# Patient Record
Sex: Male | Born: 1980 | Race: Black or African American | Hispanic: No | Marital: Single | Smoking: Current every day smoker
Health system: Southern US, Community
[De-identification: ages and names within clinical notes are randomized; demographics above are authoritative.]

## PROBLEM LIST (undated history)

## (undated) DIAGNOSIS — G629 Polyneuropathy, unspecified: Secondary | ICD-10-CM

## (undated) DIAGNOSIS — B1920 Unspecified viral hepatitis C without hepatic coma: Secondary | ICD-10-CM

## (undated) DIAGNOSIS — M549 Dorsalgia, unspecified: Secondary | ICD-10-CM

## (undated) DIAGNOSIS — F329 Major depressive disorder, single episode, unspecified: Secondary | ICD-10-CM

## (undated) DIAGNOSIS — Z765 Malingerer [conscious simulation]: Secondary | ICD-10-CM

## (undated) DIAGNOSIS — F1910 Other psychoactive substance abuse, uncomplicated: Secondary | ICD-10-CM

## (undated) DIAGNOSIS — G8929 Other chronic pain: Secondary | ICD-10-CM

## (undated) DIAGNOSIS — M542 Cervicalgia: Secondary | ICD-10-CM

## (undated) DIAGNOSIS — G4700 Insomnia, unspecified: Secondary | ICD-10-CM

## (undated) DIAGNOSIS — F32A Depression, unspecified: Secondary | ICD-10-CM

## (undated) DIAGNOSIS — M25569 Pain in unspecified knee: Secondary | ICD-10-CM

## (undated) DIAGNOSIS — R52 Pain, unspecified: Secondary | ICD-10-CM

## (undated) DIAGNOSIS — F419 Anxiety disorder, unspecified: Secondary | ICD-10-CM

## (undated) DIAGNOSIS — I499 Cardiac arrhythmia, unspecified: Secondary | ICD-10-CM

## (undated) DIAGNOSIS — R4589 Other symptoms and signs involving emotional state: Secondary | ICD-10-CM

## (undated) HISTORY — PX: PLEURAL SCARIFICATION: SHX748

## (undated) HISTORY — DX: Insomnia, unspecified: G47.00

## (undated) HISTORY — DX: Dorsalgia, unspecified: M54.9

## (undated) HISTORY — DX: Polyneuropathy, unspecified: G62.9

## (undated) HISTORY — DX: Major depressive disorder, single episode, unspecified: F32.9

## (undated) HISTORY — PX: ABDOMINAL SURGERY: SHX537

## (undated) HISTORY — DX: Depression, unspecified: F32.A

## (undated) HISTORY — PX: COLON SURGERY: SHX602

## (undated) HISTORY — DX: Other psychoactive substance abuse, uncomplicated: F19.10

## (undated) HISTORY — DX: Anxiety disorder, unspecified: F41.9

---

## 2004-06-22 ENCOUNTER — Ambulatory Visit: Payer: Self-pay | Admitting: Occupational Therapy

## 2009-05-18 ENCOUNTER — Inpatient Hospital Stay (HOSPITAL_COMMUNITY): Admission: AC | Admit: 2009-05-18 | Discharge: 2009-05-27 | Payer: Self-pay

## 2009-05-22 ENCOUNTER — Ambulatory Visit: Payer: Self-pay | Admitting: Physical Medicine & Rehabilitation

## 2009-09-25 ENCOUNTER — Encounter: Admission: RE | Admit: 2009-09-25 | Discharge: 2009-09-25 | Payer: Self-pay | Admitting: Neurosurgery

## 2009-11-18 ENCOUNTER — Emergency Department (HOSPITAL_COMMUNITY): Admission: EM | Admit: 2009-11-18 | Discharge: 2009-11-18 | Payer: Self-pay | Admitting: Emergency Medicine

## 2009-12-25 ENCOUNTER — Encounter
Admission: RE | Admit: 2009-12-25 | Discharge: 2009-12-25 | Payer: Self-pay | Admitting: Physical Medicine & Rehabilitation

## 2010-03-19 ENCOUNTER — Encounter
Admission: RE | Admit: 2010-03-19 | Discharge: 2010-03-26 | Payer: Self-pay | Admitting: Physical Medicine & Rehabilitation

## 2010-03-26 ENCOUNTER — Ambulatory Visit: Payer: Self-pay | Admitting: Physical Medicine & Rehabilitation

## 2010-05-14 ENCOUNTER — Encounter
Admission: RE | Admit: 2010-05-14 | Discharge: 2010-05-27 | Payer: Self-pay | Admitting: Physical Medicine & Rehabilitation

## 2010-05-15 ENCOUNTER — Emergency Department (HOSPITAL_COMMUNITY): Admission: EM | Admit: 2010-05-15 | Discharge: 2010-05-15 | Payer: Self-pay | Admitting: Emergency Medicine

## 2010-05-27 ENCOUNTER — Encounter (HOSPITAL_COMMUNITY)
Admission: RE | Admit: 2010-05-27 | Discharge: 2010-05-29 | Payer: Self-pay | Admitting: Physical Medicine & Rehabilitation

## 2010-06-03 ENCOUNTER — Encounter (HOSPITAL_COMMUNITY)
Admission: RE | Admit: 2010-06-03 | Discharge: 2010-07-03 | Payer: Self-pay | Source: Home / Self Care | Admitting: Physical Medicine & Rehabilitation

## 2010-06-08 ENCOUNTER — Emergency Department (HOSPITAL_COMMUNITY): Admission: EM | Admit: 2010-06-08 | Discharge: 2010-06-08 | Payer: Self-pay | Admitting: Emergency Medicine

## 2010-07-06 ENCOUNTER — Encounter (HOSPITAL_COMMUNITY)
Admission: RE | Admit: 2010-07-06 | Discharge: 2010-08-05 | Payer: Self-pay | Source: Home / Self Care | Admitting: Physical Medicine & Rehabilitation

## 2010-07-08 ENCOUNTER — Emergency Department (HOSPITAL_COMMUNITY): Admission: EM | Admit: 2010-07-08 | Discharge: 2010-07-08 | Payer: Self-pay | Admitting: Emergency Medicine

## 2010-07-13 ENCOUNTER — Emergency Department (HOSPITAL_COMMUNITY): Admission: EM | Admit: 2010-07-13 | Discharge: 2010-07-13 | Payer: Self-pay | Admitting: Emergency Medicine

## 2010-07-20 ENCOUNTER — Emergency Department (HOSPITAL_COMMUNITY): Admission: EM | Admit: 2010-07-20 | Discharge: 2010-07-20 | Payer: Self-pay | Admitting: Emergency Medicine

## 2010-08-03 ENCOUNTER — Inpatient Hospital Stay (HOSPITAL_COMMUNITY)
Admission: EM | Admit: 2010-08-03 | Discharge: 2010-08-08 | Payer: Self-pay | Attending: Psychiatry | Admitting: Psychiatry

## 2010-08-03 ENCOUNTER — Emergency Department (HOSPITAL_COMMUNITY): Admission: EM | Admit: 2010-08-03 | Discharge: 2010-08-03 | Payer: Self-pay | Admitting: Emergency Medicine

## 2010-08-03 ENCOUNTER — Ambulatory Visit: Payer: Self-pay | Admitting: Psychiatry

## 2010-08-10 ENCOUNTER — Encounter (HOSPITAL_COMMUNITY)
Admission: RE | Admit: 2010-08-10 | Discharge: 2010-09-09 | Payer: Self-pay | Source: Home / Self Care | Attending: Physical Medicine & Rehabilitation | Admitting: Physical Medicine & Rehabilitation

## 2010-09-09 ENCOUNTER — Emergency Department (HOSPITAL_COMMUNITY)
Admission: EM | Admit: 2010-09-09 | Discharge: 2010-09-09 | Payer: Self-pay | Source: Home / Self Care | Admitting: Emergency Medicine

## 2010-09-20 ENCOUNTER — Encounter: Payer: Self-pay | Admitting: Neurosurgery

## 2010-11-10 LAB — RAPID URINE DRUG SCREEN, HOSP PERFORMED
Amphetamines: NOT DETECTED
Amphetamines: NOT DETECTED
Barbiturates: NOT DETECTED
Benzodiazepines: POSITIVE — AB
Cocaine: POSITIVE — AB
Opiates: NOT DETECTED
Opiates: POSITIVE — AB
Tetrahydrocannabinol: POSITIVE — AB
Tetrahydrocannabinol: NOT DETECTED

## 2010-11-10 LAB — DIFFERENTIAL
Basophils Absolute: 0 10*3/uL (ref 0.0–0.1)
Basophils Relative: 0 % (ref 0–1)
Basophils Relative: 0 % (ref 0–1)
Eosinophils Absolute: 0.1 10*3/uL (ref 0.0–0.7)
Eosinophils Relative: 1 % (ref 0–5)
Lymphs Abs: 2.1 10*3/uL (ref 0.7–4.0)
Monocytes Absolute: 0.6 10*3/uL (ref 0.1–1.0)
Monocytes Relative: 8 % (ref 3–12)
Monocytes Relative: 10 % (ref 3–12)
Neutro Abs: 3.1 10*3/uL (ref 1.7–7.7)
Neutrophils Relative %: 67 % (ref 43–77)
Neutrophils Relative %: 64 % (ref 43–77)

## 2010-11-10 LAB — CBC
HCT: 41.7 % (ref 39.0–52.0)
Hemoglobin: 14.7 g/dL (ref 13.0–17.0)
MCH: 26.6 pg (ref 26.0–34.0)
MCH: 26.6 pg (ref 26.0–34.0)
MCHC: 35.3 g/dL (ref 30.0–36.0)
MCV: 75.5 fL — ABNORMAL LOW (ref 78.0–100.0)
Platelets: 165 10*3/uL (ref 150–400)
Platelets: 133 10*3/uL — ABNORMAL LOW (ref 150–400)
RBC: 5.52 MIL/uL (ref 4.22–5.81)
RBC: 4.93 MIL/uL (ref 4.22–5.81)
RDW: 13.6 % (ref 11.5–15.5)
WBC: 8.6 10*3/uL (ref 4.0–10.5)

## 2010-11-10 LAB — ETHANOL
Alcohol, Ethyl (B): <5 mg/dL (ref 0–10)
Alcohol, Ethyl (B): <5 mg/dL (ref 0–10)

## 2010-11-10 LAB — COMPREHENSIVE METABOLIC PANEL
ALT: 19 U/L (ref 0–53)
AST: 25 U/L (ref 0–37)
Albumin: 3.9 g/dL (ref 3.5–5.2)
Alkaline Phosphatase: 42 U/L (ref 39–117)
Alkaline Phosphatase: 34 U/L — ABNORMAL LOW (ref 39–117)
Calcium: 8.8 mg/dL (ref 8.4–10.5)
Calcium: 8.7 mg/dL (ref 8.4–10.5)
Chloride: 108 mEq/L (ref 96–112)
Creatinine, Ser: 1.03 mg/dL (ref 0.4–1.5)
GFR calc Af Amer: >60 mL/min (ref >60)
GFR calc Af Amer: >60 mL/min (ref >60)
GFR calc non Af Amer: >60 mL/min (ref >60)
Potassium: 3.5 mEq/L (ref 3.5–5.1)
Sodium: 138 mEq/L (ref 135–145)
Sodium: 141 mEq/L (ref 135–145)
Total Bilirubin: 1.1 mg/dL (ref 0.3–1.2)
Total Protein: 6.2 g/dL (ref 6.0–8.3)
Total Protein: 6.1 g/dL (ref 6.0–8.3)

## 2010-12-04 LAB — POCT I-STAT, CHEM 8
BUN: 7 mg/dL (ref 6–23)
Calcium, Ion: 0.91 mmol/L — ABNORMAL LOW (ref 1.12–1.32)
Chloride: 110 meq/L (ref 96–112)
Creatinine, Ser: 1.2 mg/dL (ref 0.4–1.5)
Glucose, Bld: 115 mg/dL — ABNORMAL HIGH (ref 70–99)
HCT: 27 % — ABNORMAL LOW (ref 39.0–52.0)
Hemoglobin: 9.2 g/dL — ABNORMAL LOW (ref 13.0–17.0)
Potassium: 3.3 meq/L — ABNORMAL LOW (ref 3.5–5.1)
Sodium: 144 meq/L (ref 135–145)
TCO2: 19 mmol/L (ref 0–100)

## 2010-12-04 LAB — TYPE AND SCREEN
ABO/RH(D): B POS
Antibody Screen: NEGATIVE

## 2010-12-04 LAB — CBC
HCT: 23.6 % — ABNORMAL LOW (ref 39.0–52.0)
HCT: 23.9 % — ABNORMAL LOW (ref 39.0–52.0)
HCT: 25.3 % — ABNORMAL LOW (ref 39.0–52.0)
HCT: 29.1 % — ABNORMAL LOW (ref 39.0–52.0)
HCT: 29.5 % — ABNORMAL LOW (ref 39.0–52.0)
Hemoglobin: 8.1 g/dL — ABNORMAL LOW (ref 13.0–17.0)
Hemoglobin: 9 g/dL — ABNORMAL LOW (ref 13.0–17.0)
MCHC: 33.7 g/dL (ref 30.0–36.0)
MCHC: 34 g/dL (ref 30.0–36.0)
MCHC: 34.1 g/dL (ref 30.0–36.0)
MCHC: 34.7 g/dL (ref 30.0–36.0)
MCV: 80.9 fL (ref 78.0–100.0)
MCV: 82.3 fL (ref 78.0–100.0)
MCV: 83.5 fL (ref 78.0–100.0)
MCV: 83.9 fL (ref 78.0–100.0)
Platelets: 231 10*3/uL (ref 150–400)
Platelets: 69 10*3/uL — ABNORMAL LOW (ref 150–400)
Platelets: 74 10*3/uL — ABNORMAL LOW (ref 150–400)
Platelets: 81 10*3/uL — ABNORMAL LOW (ref 150–400)
Platelets: 89 10*3/uL — ABNORMAL LOW (ref 150–400)
RBC: 2.9 MIL/uL — ABNORMAL LOW (ref 4.22–5.81)
RBC: 3.03 MIL/uL — ABNORMAL LOW (ref 4.22–5.81)
RBC: 3.13 MIL/uL — ABNORMAL LOW (ref 4.22–5.81)
RBC: 3.49 MIL/uL — ABNORMAL LOW (ref 4.22–5.81)
RDW: 14.3 % (ref 11.5–15.5)
RDW: 14.9 % (ref 11.5–15.5)
RDW: 15 % (ref 11.5–15.5)
RDW: 15.1 % (ref 11.5–15.5)
WBC: 4.5 10*3/uL (ref 4.0–10.5)
WBC: 5.1 10*3/uL (ref 4.0–10.5)
WBC: 7 10*3/uL (ref 4.0–10.5)
WBC: 7.6 10*3/uL (ref 4.0–10.5)
WBC: 8.4 10*3/uL (ref 4.0–10.5)

## 2010-12-04 LAB — BASIC METABOLIC PANEL
BUN: 3 mg/dL — ABNORMAL LOW (ref 6–23)
BUN: 4 mg/dL — ABNORMAL LOW (ref 6–23)
BUN: 6 mg/dL (ref 6–23)
BUN: 6 mg/dL (ref 6–23)
CO2: 23 mEq/L (ref 19–32)
CO2: 27 mEq/L (ref 19–32)
CO2: 27 mEq/L (ref 19–32)
CO2: 28 mEq/L (ref 19–32)
CO2: 29 mEq/L (ref 19–32)
Calcium: 6.7 mg/dL — ABNORMAL LOW (ref 8.4–10.5)
Calcium: 7.8 mg/dL — ABNORMAL LOW (ref 8.4–10.5)
Calcium: 8 mg/dL — ABNORMAL LOW (ref 8.4–10.5)
Chloride: 104 mEq/L (ref 96–112)
Chloride: 104 mEq/L (ref 96–112)
Chloride: 110 mEq/L (ref 96–112)
Creatinine, Ser: 0.72 mg/dL (ref 0.4–1.5)
Creatinine, Ser: 0.82 mg/dL (ref 0.4–1.5)
Creatinine, Ser: 0.93 mg/dL (ref 0.4–1.5)
GFR calc Af Amer: >60 mL/min (ref >60)
GFR calc Af Amer: >60 mL/min (ref >60)
GFR calc Af Amer: >60 mL/min (ref >60)
GFR calc Af Amer: >60 mL/min (ref >60)
GFR calc non Af Amer: >60 mL/min (ref >60)
GFR calc non Af Amer: >60 mL/min (ref >60)
GFR calc non Af Amer: >60 mL/min (ref >60)
Glucose, Bld: 106 mg/dL — ABNORMAL HIGH (ref 70–99)
Glucose, Bld: 154 mg/dL — ABNORMAL HIGH (ref 70–99)
Glucose, Bld: 97 mg/dL (ref 70–99)
Potassium: 3.1 mEq/L — ABNORMAL LOW (ref 3.5–5.1)
Potassium: 3.4 mEq/L — ABNORMAL LOW (ref 3.5–5.1)
Potassium: 3.6 mEq/L (ref 3.5–5.1)
Potassium: 3.7 mEq/L (ref 3.5–5.1)
Potassium: 3.8 mEq/L (ref 3.5–5.1)
Sodium: 137 mEq/L (ref 135–145)
Sodium: 139 mEq/L (ref 135–145)

## 2010-12-04 LAB — WOUND CULTURE

## 2010-12-04 LAB — POCT I-STAT 7, (LYTES, BLD GAS, ICA,H+H)
Acid-base deficit: 4 mmol/L — ABNORMAL HIGH (ref 0.0–2.0)
Calcium, Ion: 1.04 mmol/L — ABNORMAL LOW (ref 1.12–1.32)
O2 Saturation: 100 %
Potassium: 4.1 meq/L (ref 3.5–5.1)
Sodium: 140 meq/L (ref 135–145)
TCO2: 23 mmol/L (ref 0–100)
pCO2 arterial: 42 mmHg (ref 35.0–45.0)

## 2010-12-04 LAB — DIFFERENTIAL
Eosinophils Absolute: 0.1 10*3/uL (ref 0.0–0.7)
Eosinophils Relative: 1 % (ref 0–5)
Lymphocytes Relative: 5 % — ABNORMAL LOW (ref 12–46)
Lymphs Abs: 0.6 10*3/uL — ABNORMAL LOW (ref 0.7–4.0)
Monocytes Absolute: 0.4 10*3/uL (ref 0.1–1.0)
Monocytes Relative: 5 % (ref 3–12)
Neutro Abs: 7.7 10*3/uL (ref 1.7–7.7)

## 2010-12-04 LAB — ABO/RH: ABO/RH(D): B POS

## 2010-12-04 LAB — HEMOGLOBIN AND HEMATOCRIT, BLOOD: Hemoglobin: 9.5 g/dL — ABNORMAL LOW (ref 13.0–17.0)

## 2011-05-07 ENCOUNTER — Ambulatory Visit: Payer: Self-pay | Admitting: Family Medicine

## 2011-05-12 ENCOUNTER — Encounter: Payer: Self-pay | Admitting: Family Medicine

## 2011-05-12 ENCOUNTER — Ambulatory Visit (INDEPENDENT_AMBULATORY_CARE_PROVIDER_SITE_OTHER): Payer: Medicaid Other | Admitting: Family Medicine

## 2011-05-12 VITALS — BP 120/80 | HR 75 | Resp 16 | Ht 69.0 in | Wt 202.8 lb

## 2011-05-12 DIAGNOSIS — G4700 Insomnia, unspecified: Secondary | ICD-10-CM | POA: Insufficient documentation

## 2011-05-12 DIAGNOSIS — F3289 Other specified depressive episodes: Secondary | ICD-10-CM

## 2011-05-12 DIAGNOSIS — F329 Major depressive disorder, single episode, unspecified: Secondary | ICD-10-CM

## 2011-05-12 DIAGNOSIS — F209 Schizophrenia, unspecified: Secondary | ICD-10-CM | POA: Insufficient documentation

## 2011-05-12 DIAGNOSIS — Z0101 Encounter for examination of eyes and vision with abnormal findings: Secondary | ICD-10-CM | POA: Insufficient documentation

## 2011-05-12 DIAGNOSIS — H579 Unspecified disorder of eye and adnexa: Secondary | ICD-10-CM

## 2011-05-12 DIAGNOSIS — H109 Unspecified conjunctivitis: Secondary | ICD-10-CM | POA: Insufficient documentation

## 2011-05-12 DIAGNOSIS — F1910 Other psychoactive substance abuse, uncomplicated: Secondary | ICD-10-CM

## 2011-05-12 DIAGNOSIS — F32A Depression, unspecified: Secondary | ICD-10-CM

## 2011-05-12 MED ORDER — POLYMYXIN B-TRIMETHOPRIM 10000-0.1 UNIT/ML-% OP SOLN: 1.0000 [drp] | OPHTHALMIC | Status: AC

## 2011-05-12 MED ORDER — TRAZODONE HCL 50 MG PO TABS: 50.0000 mg | ORAL_TABLET | Freq: Every day | ORAL | Status: DC

## 2011-05-12 NOTE — Patient Instructions (Signed)
Start the trazodone for sleep- take 1 hour before you go to bed Call the pain clinic for you refill  Use the antibiotic drops for your eye for the next 5 days Next visit in 4 weeks- get a morning appointment, do not eat after midnight- do not eat breakfast

## 2011-05-12 NOTE — Assessment & Plan Note (Signed)
I discussed with him that I would not give him any habit-forming medication secondary to his history of substance abuse and he is currently in pain management. I will start him on trazodone as needed at bedtime. He will follow up in 4 weeks

## 2011-05-12 NOTE — Assessment & Plan Note (Signed)
Per above for depression. Based on patient's appearance today his mental health issues do not appear to be stable. He has depression, anxiety, hallucinations. I'll obtain information from his counselor he is seen currently. If they're not treating this he will need to be set up with a psychiatrist here in in [place] or [place] where he is currently located
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
## 2011-05-12 NOTE — Assessment & Plan Note (Signed)
Treat left eye with Polytrim x5 days. I will send him to an eye doctor for complete vision screening
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
## 2011-06-02 ENCOUNTER — Telehealth: Payer: Self-pay | Admitting: Family Medicine

## 2011-06-03 ENCOUNTER — Encounter: Payer: Self-pay | Admitting: Family Medicine

## 2011-06-10 ENCOUNTER — Emergency Department (HOSPITAL_COMMUNITY)
Admission: EM | Admit: 2011-06-10 | Discharge: 2011-06-10 | Disposition: A | Discharge status: Home / Self Care | Payer: Medicaid Other | Attending: Emergency Medicine | Admitting: Emergency Medicine

## 2011-06-10 ENCOUNTER — Encounter (HOSPITAL_COMMUNITY): Payer: Self-pay | Admitting: Oncology

## 2011-06-10 DIAGNOSIS — F329 Major depressive disorder, single episode, unspecified: Secondary | ICD-10-CM | POA: Insufficient documentation

## 2011-06-10 DIAGNOSIS — M545 Low back pain, unspecified: Secondary | ICD-10-CM | POA: Insufficient documentation

## 2011-06-10 DIAGNOSIS — F3289 Other specified depressive episodes: Secondary | ICD-10-CM | POA: Insufficient documentation

## 2011-06-10 DIAGNOSIS — F411 Generalized anxiety disorder: Secondary | ICD-10-CM | POA: Insufficient documentation

## 2011-06-10 DIAGNOSIS — J45909 Unspecified asthma, uncomplicated: Secondary | ICD-10-CM | POA: Insufficient documentation

## 2011-06-10 DIAGNOSIS — F209 Schizophrenia, unspecified: Secondary | ICD-10-CM | POA: Insufficient documentation

## 2011-06-10 MED ORDER — PREDNISONE 20 MG PO TABS
60.0000 mg | ORAL_TABLET | Freq: Once | ORAL | Status: AC
  Administered 2011-06-10: 60 mg via ORAL
  Filled 2011-06-10: qty 3

## 2011-06-10 MED ORDER — DIAZEPAM 5 MG PO TABS: 5.0000 mg | ORAL_TABLET | Freq: Three times a day (TID) | ORAL | Status: DC | PRN

## 2011-06-10 MED ORDER — DIAZEPAM 5 MG PO TABS
5.0000 mg | ORAL_TABLET | Freq: Once | ORAL | Status: AC
  Administered 2011-06-10: 5 mg via ORAL
  Filled 2011-06-10: qty 1

## 2011-06-10 MED ORDER — CYCLOBENZAPRINE HCL 10 MG PO TABS: 10.0000 mg | ORAL_TABLET | Freq: Once | ORAL | Status: DC

## 2011-06-10 MED ORDER — KETOROLAC TROMETHAMINE 60 MG/2ML IM SOLN: 60.0000 mg | Freq: Once | INTRAMUSCULAR | Status: DC

## 2011-06-10 MED ORDER — PREDNISONE 20 MG PO TABS: ORAL_TABLET | ORAL | Status: DC

## 2011-06-10 NOTE — ED Notes (Signed)
Pt reports mid/low back pain worsening x 2 days - states he had previously done yard work.  Pt states he took ibuprofen 600mg  yesterday w/o relief of symptoms; no meds taken today.  Pt also states he has headaches and restless legs.

## 2011-06-10 NOTE — ED Provider Notes (Signed)
Medical screening examination/treatment/procedure(s) were performed by non-physician practitioner and as supervising physician I was immediately available for consultation/collaboration.  Nicholes Stairs, MD 06/10/11 2342
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
## 2011-06-10 NOTE — ED Notes (Signed)
Pt presents with multiple complaints, "back ache from yard work, legs are restless and hurt, eyes need to be checked cuz of a headache, needs mental health medicine, and cream for his nerves in his foot" . Pt asking about past records and prescriptions written.  Pt is restless  Sitting in chair tapping his feet and hands.

## 2011-06-10 NOTE — ED Notes (Signed)
Pt remains restless and voices new complaint each time EDP enters the room.

## 2011-06-10 NOTE — ED Notes (Signed)
Prior to leaving triage, pt states he wanted a referral to a mental health center - states he was seen in [place] for his mental health issues.  Pt reported that he has not had his mental health medications x 6 months.

## 2011-06-14 ENCOUNTER — Telehealth: Payer: Self-pay | Admitting: Family Medicine

## 2011-06-14 ENCOUNTER — Encounter: Payer: Self-pay | Admitting: Family Medicine

## 2011-06-14 ENCOUNTER — Ambulatory Visit (INDEPENDENT_AMBULATORY_CARE_PROVIDER_SITE_OTHER): Payer: Medicaid Other | Admitting: Family Medicine

## 2011-06-14 VITALS — BP 148/98 | HR 76 | Resp 18 | Wt 202.0 lb

## 2011-06-14 DIAGNOSIS — H579 Unspecified disorder of eye and adnexa: Secondary | ICD-10-CM

## 2011-06-14 DIAGNOSIS — F32A Depression, unspecified: Secondary | ICD-10-CM

## 2011-06-14 DIAGNOSIS — F411 Generalized anxiety disorder: Secondary | ICD-10-CM

## 2011-06-14 DIAGNOSIS — F329 Major depressive disorder, single episode, unspecified: Secondary | ICD-10-CM

## 2011-06-14 DIAGNOSIS — F419 Anxiety disorder, unspecified: Secondary | ICD-10-CM

## 2011-06-14 DIAGNOSIS — F209 Schizophrenia, unspecified: Secondary | ICD-10-CM

## 2011-06-14 DIAGNOSIS — F3289 Other specified depressive episodes: Secondary | ICD-10-CM

## 2011-06-14 MED ORDER — DIAZEPAM 5 MG PO TABS: 5.0000 mg | ORAL_TABLET | Freq: Two times a day (BID) | ORAL | Status: AC | PRN

## 2011-06-14 NOTE — Progress Notes (Signed)
  Subjective:    Patient ID: Cole [name], male    DOB: [birth year], 30 y.o.   MRN: [id number]  HPI  Anxiety- pt states he was seen in ED because of anxiety, note states he was there for back pain and his psych was stable. He was given Valium which he feels helps calm him a lot. He has been on multiple meds for his anxiety, schizophrenia but currently not on any meds. He has not seen psych in some time and request a referral to a psychiatrist as well as refill on Valium. Denies any recent drug use.  Insomnia-- Continues to have problems sleeping, valium has helped some  Social- he was asking for a specific letter for him to get transportation to our office from [employer]. It was explained that he can see any PCP, have no specialty for the letter he needs.  Vision- concerned about vision, he is suppose to have glasses, he was referred to optho at our first visit, however he could not afford the co-pay. He is also concerned his pupils do not react, he was told he could not give plasma because of this???  Follows with pain clinic  Review of Systems   HEENT- +poor vision, occ blurry vision   Psych- per above, no recent hallucinations, feels very overwhelmed, depressed , no SI       Objective:   Physical Exam GEN-NAD, alert and oriented x3  HEENT- PERRL, EOMI, no nystagmus, non injected sclera, fundoscopic exam benign Psych- depressed appearing, normal speech, flat affect       Assessment & Plan:

## 2011-06-14 NOTE — Telephone Encounter (Signed)
Spoke with pt, he states he was not able to find a PCP in [place] that is why he came to [place]. Diane with Dept of transportation states he would need a specific medical reason why he needs to see me as a FP and I have no specific reason, he can see any PCP. I will let him know at the appt, that he will have to get his own ride, here or go to [facility]

## 2011-06-14 NOTE — Patient Instructions (Addendum)
We will set you up with a psychiatrist as soon as possible Take the Valium twice a day  I recommend that you go to Eye doctor- try to get the money, you need new glasses Follow-up with your pain clinic doctor Call before you run out of medication for your anxiety medication- Call around July 12, 2011 Next visit in 3 months

## 2011-06-14 NOTE — Telephone Encounter (Signed)
[phone]

## 2011-06-15 ENCOUNTER — Encounter: Payer: Self-pay | Admitting: Family Medicine

## 2011-06-15 DIAGNOSIS — F411 Generalized anxiety disorder: Secondary | ICD-10-CM | POA: Insufficient documentation

## 2011-06-15 DIAGNOSIS — F419 Anxiety disorder, unspecified: Secondary | ICD-10-CM | POA: Insufficient documentation

## 2011-06-15 HISTORY — DX: Generalized anxiety disorder: F41.1

## 2011-06-15 NOTE — Assessment & Plan Note (Signed)
Refer to psych here

## 2011-06-15 NOTE — Assessment & Plan Note (Signed)
Per above for mental illness

## 2011-06-15 NOTE — Assessment & Plan Note (Signed)
Normal eye exam with exception of vision screen Encouraged pt to reschedule with eye doctor

## 2011-06-15 NOTE — Assessment & Plan Note (Signed)
Pt is not stable regarding mental illness, his substance abuse also makes this difficult He was given valium with specific instructions, he can not get any early fills on this on increase in dose

## 2011-07-01 ENCOUNTER — Telehealth: Payer: Self-pay | Admitting: Family Medicine

## 2011-07-01 NOTE — Telephone Encounter (Signed)
Too early for refill. I put in his instructions he can call on November 12th at our last visit. He will have to wait.

## 2011-07-07 ENCOUNTER — Telehealth: Payer: Self-pay | Admitting: Family Medicine

## 2011-07-07 NOTE — Telephone Encounter (Signed)
See previous message. Not due until the 15th of Nov. This is the 7th. Too early and will fill when due

## 2011-07-07 NOTE — Telephone Encounter (Signed)
Will not fill until due on the 15th. Patient aware

## 2011-07-13 ENCOUNTER — Telehealth: Payer: Self-pay

## 2011-07-13 NOTE — Telephone Encounter (Signed)
i do not see this on his med list here, check With pharmacy re the prescriber pls

## 2011-07-14 ENCOUNTER — Other Ambulatory Visit: Payer: Self-pay | Admitting: Family Medicine

## 2011-07-14 MED ORDER — DIAZEPAM 5 MG PO TABS: ORAL_TABLET | ORAL | Status: DC

## 2011-07-14 NOTE — Telephone Encounter (Signed)
Printed

## 2011-07-14 NOTE — Telephone Encounter (Signed)
His last pickup was 10/16 [place] (valium 5mg  1 tab bid prn) #45 per [employer]

## 2011-08-09 ENCOUNTER — Other Ambulatory Visit: Payer: Self-pay | Admitting: Family Medicine

## 2011-08-16 ENCOUNTER — Telehealth: Payer: Self-pay | Admitting: Family Medicine

## 2011-08-16 MED ORDER — DIAZEPAM 5 MG PO TABS: ORAL_TABLET | ORAL | Status: DC

## 2011-08-16 NOTE — Telephone Encounter (Signed)
Printed for DR to sign  

## 2011-08-23 ENCOUNTER — Telehealth: Payer: Self-pay | Admitting: Family Medicine

## 2011-08-26 ENCOUNTER — Telehealth: Payer: Self-pay | Admitting: Family Medicine

## 2011-08-27 NOTE — Telephone Encounter (Signed)
Wanted to know when next appt was

## 2011-08-27 NOTE — Telephone Encounter (Signed)
Wanted to know when his next appt was

## 2011-09-05 ENCOUNTER — Emergency Department: Payer: Self-pay | Admitting: Unknown Physician Specialty

## 2011-09-14 ENCOUNTER — Encounter: Payer: Self-pay | Admitting: Family Medicine

## 2011-09-14 ENCOUNTER — Ambulatory Visit: Payer: Medicaid Other | Admitting: Family Medicine

## 2011-09-14 ENCOUNTER — Ambulatory Visit (INDEPENDENT_AMBULATORY_CARE_PROVIDER_SITE_OTHER): Payer: Medicaid Other | Admitting: Family Medicine

## 2011-09-14 VITALS — BP 118/76 | HR 99 | Resp 18 | Ht 69.0 in | Wt 200.1 lb

## 2011-09-14 DIAGNOSIS — F32A Depression, unspecified: Secondary | ICD-10-CM

## 2011-09-14 DIAGNOSIS — F329 Major depressive disorder, single episode, unspecified: Secondary | ICD-10-CM

## 2011-09-14 DIAGNOSIS — G4700 Insomnia, unspecified: Secondary | ICD-10-CM

## 2011-09-14 DIAGNOSIS — F411 Generalized anxiety disorder: Secondary | ICD-10-CM

## 2011-09-14 DIAGNOSIS — F3289 Other specified depressive episodes: Secondary | ICD-10-CM

## 2011-09-14 DIAGNOSIS — F209 Schizophrenia, unspecified: Secondary | ICD-10-CM

## 2011-09-14 DIAGNOSIS — F1910 Other psychoactive substance abuse, uncomplicated: Secondary | ICD-10-CM

## 2011-09-14 DIAGNOSIS — H579 Unspecified disorder of eye and adnexa: Secondary | ICD-10-CM

## 2011-09-14 DIAGNOSIS — F419 Anxiety disorder, unspecified: Secondary | ICD-10-CM

## 2011-09-14 MED ORDER — DIAZEPAM 5 MG PO TABS: ORAL_TABLET | ORAL | Status: DC

## 2011-09-14 MED ORDER — TRAZODONE HCL 100 MG PO TABS: 100.0000 mg | ORAL_TABLET | Freq: Every day | ORAL | Status: DC

## 2011-09-14 NOTE — Patient Instructions (Signed)
I will call your pharmacy to check on the co-pay Start the trazadone 100mg  at bedtime for sleep. Avoiding sleeping or napping during the day, no coffee, no soda before bedtime Use the Valium as prescribed, no early refills Continue with your pain doctor We will refer you to psychiatry  We will refer you to the eye doctor F/U 4 weeks

## 2011-09-14 NOTE — Progress Notes (Signed)
  Subjective:    Patient ID: Cole [name], male    DOB: 1981-05-06, 31 y.o.   MRN: [id number]  HPI Substance abuse- [name]- Cole [name]- follows regularly per pt    Chronic back pain- follows with Head pain management  No medications for schizophrenia or depression - he is not seeing a psychiatrist, states valium keeps him mellow and uses this once a day.  Continues to hallucinate at times.  Insomnia- Continues to have difficulty sleepingTries to go sleep at 12am and he wakes, up, take Valium 9 oclock and then watches TV, , typically wakes up 3-4 hours later, Naps in the morning and then afternoon because he is tired and bored. He is thinking about taking classes at community college  Vision is still blurry- was unable to afford the co-pay at the first eye doctor I referred him to. Would like to see [name] in [name], this is where he received his glasses from initially  Needs help with lowering his his copay for his meds  Review of Systems  GEN- denies fatigue, fever, weight loss,weakness, recent illness HEENT- denies eye drainage,+ change in vision, nasal discharge, CVS- denies chest pain, palpitations RESP- denies SOB, cough, wheeze ABD- denies N/V, change in stools, abd pain MSK- + joint pain, muscle aches, injury Neuro- + headache,denies  dizziness, syncope, seizure activity      Objective:   Physical Exam GEN- NAD, alert and oriented x3 HEENT- PERRL, EOMI, noninjected sclera, pink conjunctiva, MMM, oropharynx clear,  fundoscopic exam benign  CVS- RRR, no murmur RESP-CTAB EXT- No edema Pulses- Radial, DP- 2+ Psych-flat affect, appears depressed, not overly anxious,no apparent hallucinations, no apparent SI       Assessment & Plan:

## 2011-09-15 ENCOUNTER — Telehealth: Payer: Self-pay | Admitting: Family Medicine

## 2011-09-15 ENCOUNTER — Encounter: Payer: Self-pay | Admitting: Family Medicine

## 2011-09-15 NOTE — Assessment & Plan Note (Signed)
Refilled Valium. Patient is on contract here at the office.  I did discuss with patient I will not fill his narcotic medication. He will have to continue with pain clinic. If he fails to drug test been our contract will also be avoided.

## 2011-09-15 NOTE — Telephone Encounter (Signed)
When pt calls back please let him know I can not do anything about his Co-pay, I spoke with his pharmacy. Since he has a pain clinic - Medicaid has locked in his prescriptions for 1 year. The only thing he can do is call Medicaid himself and ask them if they will help, but he has to do this.

## 2011-09-15 NOTE — Assessment & Plan Note (Signed)
Per report patient is following with a counselor. He denies any recent use of cocaine or other illicit drugs.

## 2011-09-15 NOTE — Assessment & Plan Note (Signed)
Refer to psychiatry. 

## 2011-09-15 NOTE — Telephone Encounter (Signed)
Spoke with pt

## 2011-09-15 NOTE — Telephone Encounter (Signed)
Spoke with pt and he is aware that he needs to call Medicaid.

## 2011-09-15 NOTE — Assessment & Plan Note (Signed)
Did not feel his mental status is stable at this time. He continues to hallucinate with his given schizophrenia diagnosis and he appears very depressed. He does not have any activities during the day and basically sleeps on and off. All refer him to a psychiatrist here in town since he has not found another one

## 2011-09-15 NOTE — Assessment & Plan Note (Signed)
Will refer patient to the vision Center that he was previously seen and [place]. His exam is benign. He does need his reading glasses again which he broke.

## 2011-09-15 NOTE — Assessment & Plan Note (Signed)
Long-standing problem for patient. He needs to be seen by psychiatry. I will restart him on trazodone at 100 mg at bedtime

## 2011-09-16 ENCOUNTER — Ambulatory Visit: Payer: Medicaid Other | Admitting: Family Medicine

## 2011-09-23 ENCOUNTER — Telehealth: Payer: Self-pay | Admitting: Family Medicine

## 2011-09-23 NOTE — Telephone Encounter (Signed)
Wanted to know about his referral. Transferred to s[place]ie

## 2011-10-06 ENCOUNTER — Telehealth: Payer: Self-pay | Admitting: Family Medicine

## 2011-10-06 NOTE — Telephone Encounter (Signed)
FYI- Told Cole [name] that "pain management" told him to call PCP to get med filled because they wouldn't do it. Cole [name] was in the process of finding him a pain management Dr because supposedly he didn't have one.

## 2011-10-06 NOTE — Telephone Encounter (Signed)
I am only prescribing his Valium, not his narcotics, I will discuss this with him at the appt.But if he calls back you can give him the message

## 2011-10-06 NOTE — Telephone Encounter (Signed)
Primary Dr will be back on Monday. Will address at that time

## 2011-10-12 ENCOUNTER — Ambulatory Visit: Payer: Medicaid Other | Admitting: Family Medicine

## 2011-10-15 ENCOUNTER — Ambulatory Visit (INDEPENDENT_AMBULATORY_CARE_PROVIDER_SITE_OTHER): Payer: Medicaid Other | Admitting: Family Medicine

## 2011-10-15 ENCOUNTER — Encounter: Payer: Self-pay | Admitting: Family Medicine

## 2011-10-15 VITALS — BP 118/70 | HR 90 | Resp 20 | Ht 69.0 in | Wt 197.1 lb

## 2011-10-15 DIAGNOSIS — F32A Depression, unspecified: Secondary | ICD-10-CM

## 2011-10-15 DIAGNOSIS — F329 Major depressive disorder, single episode, unspecified: Secondary | ICD-10-CM

## 2011-10-15 DIAGNOSIS — F209 Schizophrenia, unspecified: Secondary | ICD-10-CM

## 2011-10-15 DIAGNOSIS — F1910 Other psychoactive substance abuse, uncomplicated: Secondary | ICD-10-CM

## 2011-10-15 DIAGNOSIS — F3289 Other specified depressive episodes: Secondary | ICD-10-CM

## 2011-10-15 DIAGNOSIS — G4700 Insomnia, unspecified: Secondary | ICD-10-CM

## 2011-10-15 DIAGNOSIS — G8929 Other chronic pain: Secondary | ICD-10-CM

## 2011-10-15 MED ORDER — TRAZODONE HCL 100 MG PO TABS: 100.0000 mg | ORAL_TABLET | Freq: Every day | ORAL | Status: DC

## 2011-10-15 MED ORDER — DIAZEPAM 5 MG PO TABS: ORAL_TABLET | ORAL | Status: DC

## 2011-10-15 NOTE — Patient Instructions (Signed)
Call around for a psychiatrist, if you find one and they need a referral then let us know- make sure you have the name of the practice and the phone number Continue the valium Continue trazadone Work on getting a job We will fax your prescription for you for next month.  F/U in 3 months

## 2011-10-17 ENCOUNTER — Encounter: Payer: Self-pay | Admitting: Family Medicine

## 2011-10-17 DIAGNOSIS — G8929 Other chronic pain: Secondary | ICD-10-CM

## 2011-10-17 HISTORY — DX: Other chronic pain: G89.29

## 2011-10-17 NOTE — Assessment & Plan Note (Signed)
Await psych appt. He is at least following with his substance abuse counselor and their group sessions

## 2011-10-17 NOTE — Assessment & Plan Note (Signed)
Pt will have to f/u with his pain management.
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
## 2011-10-17 NOTE — Assessment & Plan Note (Signed)
Completed a program by report, he is very proud of himself

## 2011-10-17 NOTE — Assessment & Plan Note (Signed)
This has been a very difficult situation as no psychiatrist is available to see him right now. He is going to start looking elsewhere, for now appt with psych in 3 months. Low dose trazadone and Valium

## 2011-10-17 NOTE — Assessment & Plan Note (Addendum)
Improvement in sleep, he needs to find something to do during the daytime, I encouraged his pursuit of part-time work  Continue current dose Trazadone

## 2011-10-18 ENCOUNTER — Telehealth: Payer: Self-pay | Admitting: Family Medicine

## 2011-10-19 ENCOUNTER — Encounter: Payer: Self-pay | Admitting: Family Medicine

## 2011-10-19 ENCOUNTER — Telehealth: Payer: Self-pay | Admitting: Family Medicine

## 2011-10-19 NOTE — Telephone Encounter (Signed)
I spoke with patient Child psychotherapist. A letter will be faxed to her office explaining why transportation  Is needed.

## 2011-10-19 NOTE — Telephone Encounter (Signed)
Pt has appt with [facility] for feb 21st 8:00. Pt is aware of this appt.

## 2011-10-22 ENCOUNTER — Ambulatory Visit: Payer: Medicaid Other | Admitting: Family Medicine

## 2011-10-22 NOTE — Telephone Encounter (Signed)
Pt aware.

## 2011-11-09 ENCOUNTER — Telehealth: Payer: Self-pay | Admitting: Orthopedic Surgery

## 2011-11-09 NOTE — Telephone Encounter (Signed)
Patient wanted [place] to send him a list of local pain clinics again. Will mail it today.

## 2011-11-11 ENCOUNTER — Telehealth: Payer: Self-pay

## 2011-11-11 NOTE — Telephone Encounter (Signed)
Wants refill of diazepam

## 2011-11-12 ENCOUNTER — Telehealth: Payer: Self-pay | Admitting: Orthopedic Surgery

## 2011-11-12 ENCOUNTER — Other Ambulatory Visit: Payer: Self-pay | Admitting: Family Medicine

## 2011-11-12 MED ORDER — DIAZEPAM 5 MG PO TABS: ORAL_TABLET | ORAL | Status: DC

## 2011-11-12 NOTE — Telephone Encounter (Signed)
Wanted referral to [facility]. Will request referral form and return.

## 2011-11-12 NOTE — Telephone Encounter (Signed)
Refill done.  

## 2011-11-18 ENCOUNTER — Telehealth: Payer: Self-pay | Admitting: Orthopedic Surgery

## 2011-11-18 NOTE — Telephone Encounter (Signed)
[facility] could not get him in quick enough so he requested a referral to [facility] which is now [facility] of [facility]. Will fax info to them. [phone].

## 2011-11-19 ENCOUNTER — Telehealth (INDEPENDENT_AMBULATORY_CARE_PROVIDER_SITE_OTHER): Payer: Self-pay | Admitting: General Surgery

## 2011-11-19 ENCOUNTER — Telehealth: Payer: Self-pay | Admitting: Orthopedic Surgery

## 2011-11-19 NOTE — Telephone Encounter (Signed)
Pt calling in for referral to [facility], for back and leg pain.  Notes in chart indicate this was done yesterday by Earney [place], PA.

## 2011-11-19 NOTE — Telephone Encounter (Signed)
Request to refax info to [facility].

## 2011-12-02 ENCOUNTER — Telehealth: Payer: Self-pay | Admitting: Family Medicine

## 2011-12-03 ENCOUNTER — Telehealth: Payer: Self-pay | Admitting: Family Medicine

## 2011-12-03 NOTE — Telephone Encounter (Signed)
Pt called stating that he would like for Dr. Jeanice Lim to prescribe pain med (oxcodone 5mg ) or send him to a pain specialist in [place].  He stated that he can not go to Dr. Gerilyn Pilgrim.  Please advise.

## 2011-12-03 NOTE — Telephone Encounter (Signed)
Called and his mother answered, he is not at home

## 2011-12-03 NOTE — Telephone Encounter (Signed)
Called patient and left message for them to return call at the office. Another message already open on this patient. Will delete this one

## 2011-12-06 ENCOUNTER — Telehealth: Payer: Self-pay

## 2011-12-06 DIAGNOSIS — G8929 Other chronic pain: Secondary | ICD-10-CM

## 2011-12-06 MED ORDER — TRAMADOL HCL 50 MG PO TABS: 50.0000 mg | ORAL_TABLET | Freq: Three times a day (TID) | ORAL | Status: DC | PRN

## 2011-12-06 NOTE — Telephone Encounter (Signed)
Spoke with pt I would not fill his chronic pain meds as he has been dismissed from the pain clinic secondary to no show. I will give him tramadol in the meantime. [facility] referral will be made.

## 2011-12-13 ENCOUNTER — Other Ambulatory Visit: Payer: Self-pay | Admitting: Family Medicine

## 2011-12-15 ENCOUNTER — Other Ambulatory Visit: Payer: Self-pay

## 2011-12-15 ENCOUNTER — Telehealth: Payer: Self-pay

## 2011-12-15 MED ORDER — TRAMADOL HCL 50 MG PO TABS: 50.0000 mg | ORAL_TABLET | Freq: Three times a day (TID) | ORAL | Status: DC | PRN

## 2011-12-16 ENCOUNTER — Other Ambulatory Visit: Payer: Self-pay

## 2011-12-16 MED ORDER — TRAMADOL HCL 50 MG PO TABS: 50.0000 mg | ORAL_TABLET | Freq: Three times a day (TID) | ORAL | Status: DC | PRN

## 2011-12-16 NOTE — Telephone Encounter (Signed)
noted 

## 2011-12-18 ENCOUNTER — Encounter (HOSPITAL_COMMUNITY): Payer: Self-pay | Admitting: *Deleted

## 2011-12-18 ENCOUNTER — Emergency Department (HOSPITAL_COMMUNITY)
Admission: EM | Admit: 2011-12-18 | Discharge: 2011-12-20 | Disposition: A | Discharge status: Home / Self Care | Payer: Medicaid Other | Attending: Emergency Medicine | Admitting: Emergency Medicine

## 2011-12-18 DIAGNOSIS — Z046 Encounter for general psychiatric examination, requested by authority: Secondary | ICD-10-CM | POA: Insufficient documentation

## 2011-12-18 DIAGNOSIS — F329 Major depressive disorder, single episode, unspecified: Secondary | ICD-10-CM | POA: Insufficient documentation

## 2011-12-18 DIAGNOSIS — F3289 Other specified depressive episodes: Secondary | ICD-10-CM | POA: Insufficient documentation

## 2011-12-18 DIAGNOSIS — F209 Schizophrenia, unspecified: Secondary | ICD-10-CM

## 2011-12-18 LAB — BASIC METABOLIC PANEL
CO2: 28 mEq/L (ref 19–32)
Chloride: 102 mEq/L (ref 96–112)
Glucose, Bld: 80 mg/dL (ref 70–99)
Sodium: 138 mEq/L (ref 135–145)

## 2011-12-18 LAB — DIFFERENTIAL
Basophils Absolute: 0 10*3/uL (ref 0.0–0.1)
Lymphocytes Relative: 38 % (ref 12–46)
Lymphs Abs: 2.1 10*3/uL (ref 0.7–4.0)
Monocytes Absolute: 0.4 10*3/uL (ref 0.1–1.0)
Neutro Abs: 2.6 10*3/uL (ref 1.7–7.7)

## 2011-12-18 LAB — CBC
HCT: 44 % (ref 39.0–52.0)
Platelets: 162 10*3/uL (ref 150–400)
RBC: 6.04 MIL/uL — ABNORMAL HIGH (ref 4.22–5.81)
RDW: 16 % — ABNORMAL HIGH (ref 11.5–15.5)
WBC: 5.4 10*3/uL (ref 4.0–10.5)

## 2011-12-18 LAB — RAPID URINE DRUG SCREEN, HOSP PERFORMED
Amphetamines: NOT DETECTED
Benzodiazepines: POSITIVE — AB
Opiates: NOT DETECTED
Tetrahydrocannabinol: NOT DETECTED

## 2011-12-18 LAB — ETHANOL: Alcohol, Ethyl (B): <11 mg/dL (ref 0–11)

## 2011-12-18 MED ORDER — LORAZEPAM 1 MG PO TABS
1.0000 mg | ORAL_TABLET | Freq: Three times a day (TID) | ORAL | Status: DC | PRN
  Administered 2011-12-18 – 2011-12-20 (×3): 1 mg via ORAL
  Filled 2011-12-18 (×4): qty 1

## 2011-12-18 MED ORDER — ACETAMINOPHEN 325 MG PO TABS: 650.0000 mg | ORAL_TABLET | ORAL | Status: DC | PRN

## 2011-12-18 MED ORDER — ALUM & MAG HYDROXIDE-SIMETH 200-200-20 MG/5ML PO SUSP: 30.0000 mL | ORAL | Status: DC | PRN

## 2011-12-18 MED ORDER — ONDANSETRON HCL 4 MG PO TABS: 4.0000 mg | ORAL_TABLET | Freq: Three times a day (TID) | ORAL | Status: DC | PRN

## 2011-12-18 MED ORDER — ZOLPIDEM TARTRATE 5 MG PO TABS
5.0000 mg | ORAL_TABLET | Freq: Every evening | ORAL | Status: DC | PRN
  Administered 2011-12-20: 5 mg via ORAL
  Filled 2011-12-18: qty 1

## 2011-12-18 MED ORDER — NICOTINE 21 MG/24HR TD PT24
21.0000 mg | MEDICATED_PATCH | Freq: Every day | TRANSDERMAL | Status: DC
  Administered 2011-12-18 – 2011-12-20 (×3): 21 mg via TRANSDERMAL
  Filled 2011-12-18 (×5): qty 1

## 2011-12-18 MED ORDER — LORAZEPAM 1 MG PO TABS: 1.0000 mg | ORAL_TABLET | Freq: Three times a day (TID) | ORAL | Status: AC | PRN

## 2011-12-18 MED ORDER — LORAZEPAM 1 MG PO TABS
1.0000 mg | ORAL_TABLET | Freq: Once | ORAL | Status: AC
  Administered 2011-12-18: 1 mg via ORAL
  Filled 2011-12-18: qty 1

## 2011-12-18 MED ORDER — IBUPROFEN 400 MG PO TABS
600.0000 mg | ORAL_TABLET | Freq: Three times a day (TID) | ORAL | Status: DC | PRN
  Administered 2011-12-19: 600 mg via ORAL
  Filled 2011-12-18: qty 2
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
## 2011-12-18 NOTE — ED Notes (Signed)
Pt notified that he was going to be discharged. Pt concerned thinking "we were going to help him." Advised Felissa, ACT and she said she will go see him shortly. Patient notified.

## 2011-12-18 NOTE — ED Notes (Signed)
Patients belongings collected and placed in a personal belongings bag and placed in a secure locker
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
## 2011-12-18 NOTE — ED Notes (Signed)
Pt presents with c/o stress, anxiety,wants referral to behavioral unit at [facility]. Having "problems at home with mother and girlfriend. Pt is slightly agitated.

## 2011-12-18 NOTE — ED Notes (Signed)
Pt was being discharged but then advised Cole [name], ACT that he would go home and overdose or cut his wrist.

## 2011-12-18 NOTE — ED Notes (Signed)
Cole [name], ACT in with pt at this time.

## 2011-12-19 MED ORDER — TRAMADOL HCL 50 MG PO TABS
50.0000 mg | ORAL_TABLET | Freq: Three times a day (TID) | ORAL | Status: DC | PRN
  Filled 2011-12-19: qty 1

## 2011-12-19 MED ORDER — TRAMADOL HCL 50 MG PO TABS
50.0000 mg | ORAL_TABLET | Freq: Once | ORAL | Status: AC
  Administered 2011-12-19: 50 mg via ORAL
  Filled 2011-12-19: qty 1

## 2011-12-19 MED ORDER — TRAZODONE HCL 50 MG PO TABS
100.0000 mg | ORAL_TABLET | Freq: Every day | ORAL | Status: DC
  Administered 2011-12-19: 100 mg via ORAL
  Filled 2011-12-19 (×2): qty 1

## 2011-12-19 MED ORDER — TRAZODONE HCL 50 MG PO TABS: 50.0000 mg | ORAL_TABLET | Freq: Every day | ORAL | Status: DC

## 2011-12-19 MED ORDER — GABAPENTIN 300 MG PO CAPS
300.0000 mg | ORAL_CAPSULE | Freq: Three times a day (TID) | ORAL | Status: DC
  Administered 2011-12-19 – 2011-12-20 (×3): 300 mg via ORAL
  Filled 2011-12-19 (×5): qty 1

## 2011-12-19 MED ORDER — IBUPROFEN 800 MG PO TABS
800.0000 mg | ORAL_TABLET | Freq: Four times a day (QID) | ORAL | Status: DC | PRN
  Filled 2011-12-19: qty 1

## 2011-12-19 MED ORDER — GABAPENTIN 300 MG PO CAPS
ORAL_CAPSULE | ORAL | Status: AC
  Filled 2011-12-19: qty 1

## 2011-12-19 MED ORDER — BISACODYL 5 MG PO TBEC
10.0000 mg | DELAYED_RELEASE_TABLET | Freq: Once | ORAL | Status: AC
  Administered 2011-12-19: 10 mg via ORAL
  Filled 2011-12-19: qty 2

## 2011-12-19 MED ORDER — TRAZODONE HCL 50 MG PO TABS
ORAL_TABLET | ORAL | Status: AC
  Filled 2011-12-19: qty 2

## 2011-12-19 NOTE — ED Notes (Signed)
Pt sleeping. 

## 2011-12-19 NOTE — ED Notes (Addendum)
Pt continually asking for oxycodone. Made pt aware that in his files, Dr. Jeanice Lim stated he did not feel his script for oxycodone because he has been released from the pain clinic due to missing to many appts. Pt then asking for tramadol. EDP made aware that pt is asking for pain meds. EDP ordered 1 dose of tramadol and stated that is all he is getting.Marland KitchenMarland Kitchenperiod.  Pt made aware of this also!

## 2011-12-19 NOTE — ED Notes (Signed)
Patient requesting Ativan for "nerves" Ativan 1 mg given per order.
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
## 2011-12-19 NOTE — ED Notes (Signed)
Pt ambulated to restroom, pt requesting chocolate ice cream. Pt offered to brush teeth and shower.

## 2011-12-20 MED ORDER — HYDROCODONE-ACETAMINOPHEN 5-325 MG PO TABS: 1.0000 | ORAL_TABLET | ORAL | Status: DC | PRN

## 2011-12-20 MED ORDER — OXYCODONE-ACETAMINOPHEN 5-325 MG PO TABS: 1.0000 | ORAL_TABLET | ORAL | Status: AC | PRN

## 2011-12-20 MED ORDER — OXYCODONE-ACETAMINOPHEN 5-325 MG PO TABS
2.0000 | ORAL_TABLET | ORAL | Status: DC | PRN
  Administered 2011-12-20 (×3): 2 via ORAL
  Filled 2011-12-20 (×3): qty 2
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
## 2011-12-20 NOTE — Discharge Instructions (Signed)
Followup your normal psychiatric on Thursday. Continue your regular medicines. Your prescriptions are ready at your pharmacy for pick up.

## 2011-12-20 NOTE — ED Notes (Signed)
Pt signed contract for safety form. Denies hi/si at time d/c.

## 2011-12-20 NOTE — ED Notes (Signed)
Tommy from [facility] in to speak with pt now.

## 2011-12-28 ENCOUNTER — Emergency Department (HOSPITAL_COMMUNITY)
Admission: EM | Admit: 2011-12-28 | Discharge: 2011-12-28 | Disposition: A | Discharge status: Home / Self Care | Payer: Medicaid Other | Attending: Emergency Medicine | Admitting: Emergency Medicine

## 2011-12-28 ENCOUNTER — Encounter (HOSPITAL_COMMUNITY): Payer: Self-pay

## 2011-12-28 DIAGNOSIS — Z5900 Homelessness unspecified: Secondary | ICD-10-CM | POA: Insufficient documentation

## 2011-12-28 DIAGNOSIS — F411 Generalized anxiety disorder: Secondary | ICD-10-CM | POA: Insufficient documentation

## 2011-12-28 DIAGNOSIS — F3289 Other specified depressive episodes: Secondary | ICD-10-CM | POA: Insufficient documentation

## 2011-12-28 DIAGNOSIS — Z79899 Other long term (current) drug therapy: Secondary | ICD-10-CM | POA: Insufficient documentation

## 2011-12-28 DIAGNOSIS — F1910 Other psychoactive substance abuse, uncomplicated: Secondary | ICD-10-CM | POA: Insufficient documentation

## 2011-12-28 DIAGNOSIS — F329 Major depressive disorder, single episode, unspecified: Secondary | ICD-10-CM | POA: Insufficient documentation

## 2011-12-28 DIAGNOSIS — Z8659 Personal history of other mental and behavioral disorders: Secondary | ICD-10-CM

## 2011-12-28 DIAGNOSIS — F17200 Nicotine dependence, unspecified, uncomplicated: Secondary | ICD-10-CM | POA: Insufficient documentation

## 2011-12-28 MED ORDER — OLANZAPINE 10 MG PO TABS: 10.0000 mg | ORAL_TABLET | Freq: Two times a day (BID) | ORAL | Status: DC
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
## 2011-12-28 NOTE — Discharge Instructions (Signed)
Drug Abuse, Frequently Asked Questions  Drug addiction is a complex brain disease. It is characterized by compulsive, at times uncontrollable, drug craving, seeking, and use that persists even in the face of extremely negative results. Drug seeking becomes compulsive, in large part as a result of the effects of prolonged drug use on brain functioning and, thus, on behavior. For many people, drug addiction becomes chronic, with relapses possible even after long periods of being off the drug.  HOW QUICKLY CAN I BECOME ADDICTED TO A DRUG?  There is no easy answer to this. If and how quickly you might become addicted to a drug depends on many factors including the biology of your body. All drugs are potentially harmful and may have life-threatening consequences associated with their use. There are also vast differences among individuals in sensitivity to various drugs. While one person may use a drug many times and suffer no ill effects, another person may be particularly vulnerable and overdose or developing a craving with the first use. There is no way of knowing in advance how someone may react.  HOW DO I KNOW IF SOMEONE IS ADDICTED TO DRUGS?  If a person is compulsively seeking and using a drug despite negative consequences (such as loss of job, debt, physical problems brought on by drug abuse, or family problems) then he or she is probably addicted. Those who screen for drug problems, such as physicians, have developed the CAGE questionnaire. These four simple questions can help detect substance abuse problems:   Have you ever felt you ought to Cut down on your drinking/drug use?   Have people ever Annoyed you by criticizing your drinking/drug use?   Have you ever felt bad or Guilty about your drinking/drug use?   Have you ever had a drink or taken a drug first thing in the morning to steady your nerves or get rid of a hangover (Eye-opener)?  WHAT ARE THE PHYSICAL SIGNS OF ABUSE OR ADDICTION?  The physical  signs of abuse or addiction can vary depending on the person and the drug being abused. For example, someone who abuses marijuana may have a chronic cough or worsening of asthmatic conditions. THC, the chemical in marijuana responsible for producing its effects, is associated with weakening the immune system which makes the user more vulnerable to infections, such as pneumonia. Each drug has short-term and long-term physical effects. Stimulants like cocaine increase heart rate and blood pressure, whereas opioids like heroin may slow the heart rate and reduce breathing (respiration).   ARE THERE EFFECTIVE TREATMENTS FOR DRUG ADDICTION?  Drug addiction can be effectively treated with behavioral-based therapies and, for addiction to some drugs such as heroin or nicotine, medications may be used. Treatment may vary for each person depending on the type of drug(s) being used and multiple courses of treatment may be needed to achieve success. Research has revealed 13 basic principles that underlie effective drug addiction treatment. These are discussed in NIDA's Principles of Drug Addiction Treatment: A Research-Based Guide.  WHERE CAN I FIND INFORMATION ABOUT DRUG TREATMENT PROGRAMS?   For referrals to treatment programs, visit the Substance Abuse and Mental Health Services Administration online at http://findtreatment.samhsa.gov/.   NIDA publishes an expanding series of treatment manuals, the "clinical toolbox," that gives drug treatment providers research-based information for creating effective treatment programs.  WHAT IS DETOXIFICATION, OR "DETOX"?  Detoxification is the process of allowing the body to rid itself of a drug while managing the symptoms of withdrawal. It is often the first   with a behavioral-based therapy and/or a medication, if available. Detox alone with no follow-up is not treatment.  WHAT IS WITHDRAWAL? HOW LONG DOES IT  LAST? Withdrawal is the variety of symptoms that occur after use of some addictive drugs is reduced or stopped. Length of withdrawal and symptoms vary with the type of drug. For example, physical symptoms of heroin withdrawal may include restlessness, muscle and bone pain, insomnia, diarrhea, vomiting, and cold flashes. These physical symptoms may last for several days, but the general depression, or dysphoria (opposite of euphoria), that often accompanies heroin withdrawal, may last for weeks. In many cases withdrawal can be easily treated with medications to ease the symptoms. But treating withdrawal is not the same as treating addiction.  WHAT ARE THE COSTS OF DRUG ABUSE TO SOCIETY? Beyond the raw numbers are other costs to society:  Spread of infectious diseases such as HIV/AIDS and hepatitis C either through sharing of drug paraphernalia or unprotected sex.   Deaths due to overdose or other complications from drug use.   Effects on unborn children of pregnant drug users.   Other effects such as crime and homelessness.  IF A PREGNANT WOMAN ABUSES DRUGS, DOES IT AFFECT THE FETUS?  Many substances including alcohol, nicotine, and drugs of abuse can have negative effects on the developing fetus because they are transferred to the fetus across the placenta. For example, nicotine has been connected with premature birth and low birth weight, as has the use of cocaine. Scientific studies have shown that babies born to marijuana users were shorter, weighed less, and had smaller head sizes than those born to mothers who did not use the drug. Smaller babies are more likely to develop health problems.   Whether a baby's health problems, if caused by a drug, will continue as the child grows, is not always known. Research does show that children born to mothers who used marijuana regularly during pregnancy may have trouble concentrating, when older. Our research continues to produce insights on the negative  effects of drug use on the fetus.  Document Released: 08/19/2003 Document Revised: 08/05/2011 Document Reviewed: 11/15/2008 [facility] Patient Information 2012 [place], [place].Schizophrenia Schizophrenia is a serious mental illness. There is disturbed and disorganized thinking, language, and behavior. Patients may see, hear, or feel things that are not really there. Sometimes speech is incoherent and there are multiple problems with day to day living. Schizophrenia should not be confused with multiple personality disorder (now called dissociative identity disorder) in which a person has at least two distinct personalities. About 1% of people have schizophrenia in their lifetimes. It affects men and women equally. CAUSES  There are many theories about the cause of schizophrenia. The genes a person inherits from his or her parents may be partially responsible. Stress in a person's environment can trigger episodes. A person may have functioned normally for years and then have an acute (sudden) psychotic episode caused by stress. Some scientists believe that something might happen before birth, such as a viral infection in the womb, that causes schizophrenic symptoms (problems) decades later. Special scans, such as PET (positron-emission tomography) have been used to look at the brains of people with this illness. These pictures show that some parts of the brain seem to have metabolic or chemical abnormalities. Lab studies have shown that nerve cells in some parts of the brains of schizophrenics may be uneven or damaged. Another possible cause is that chemicals carrying signals between nerve cells may not be working. Schizophrenia does not appear  to be caused by family problems. Stress does appear to make things worse for people with this illness. SYMPTOMS No single symptom defines this condition. Important signs are:  Hallucinations (hearing voices or seeing things that are not there to hear or see).    Dressing inappropriately.   Neglecting personal hygiene and grooming.   Withdrawing from social contacts and not speaking to anybody (autism).   Inability to understand what a schizophrenic is saying.   A growing distrust of people without good cause.   Being very bland or blunted emotionally (flat affect).  TYPES OF SCHIZOPHRENIA  Paranoid schizophrenia involves delusional thoughts. The patient believes people around them are against them and plotting against them.   In grandiose schizophrenia the patient may feel that he is God, or the President, etc.   Disorganized schizophrenia involves symptoms of disorganized speech.  TREATMENT  Medications are the most important part of the treatment of schizophrenia. Many medications are available that can relieve symptoms. It is often helpful if these can be administered by a more trusted family member because the patient may sometimes think they are being poisoned. It is important that the medications be given regularly even when the patient seems to be doing well. Do not stop giving medications without instruction by a caregiver. This could lead to a relapse. Hospitalization may sometimes be necessary if symptoms cannot be controlled with medications. Schizophrenia may be lifelong. However, periods of illness may be inter spaced with long periods of normality. Medications can greatly improve the quality of life. Non prescribed drugs and alcohol should be avoided.  Assistance is available for care. The [employer] the [employer] is an organization of family members of people with severe mental illness. They direct families and patients to support groups, education, and advocacy programs for additional help. [employer] for the [employer]ally Ill) toll-free help line number is 800/950-NAMI, or 5183056460. Document Released: 08/13/2000 Document Revised: 08/05/2011 Document Reviewed: 08/16/2005 [facility] Patient Information  2012 [place], [place].  RESOURCE GUIDE  Dental Problems  Patients with Medicaid: [facility] [phone] [address]                                           [phone] [address] Phone:  [phone]                                                  Phone:  [phone]  If unable to pay or uninsured, contact:  [facility] or [facility]. to become qualified for the adult dental clinic.  Chronic Pain Problems Contact [facility]  [phone] Patients need to be referred by their primary care doctor.  Insufficient Money for Medicine Contact United Way:  call "211" or [facility] [phone].  No Primary Care Doctor Call [facility]  [phone] Other agencies that provide inexpensive medical care    [facility]  [phone]    [facility]  [phone]    [facility]  [phone]    [facility]  [phone]    [facility]  [phone]    [facility]  [phone]  Psychological Services [facility]  [phone] [facility]  [phone] [facility]   [phone] (emergency services [phone])  Substance Abuse Resources [facility]  [phone] [facility] [phone]) [phone] [facility] [phone] Floydene Flock [phone] Residential & Outpatient Substance Abuse Program  [phone]  Abuse/Neglect [facility] Child Abuse Hotline 231-442-1725 [facility] Child Abuse Hotline (817) 638-3873 (After Hours)  Emergency Shelter [facility] [phone]  M[facility] [phone] [facility] Alert Services [phone]  MRSA Hotline #:   (339) 876-0851    [facility] Resources  [facility]     [employer]                          [facility] Dept. [address] [place]                        [address]      [address]  Blondell Reveal Phone:  [phone]                                   Phone:  [phone]                 Phone:  [phone]  [facility] Mental Health Phone:  [phone]  [facility] Child Abuse Hotline [phone] [phone] (After Hours)

## 2011-12-28 NOTE — ED Notes (Signed)
Pt stated he is suicidal, denies plan, denies hi. Refuses to answer most ?'s, "you know why i'm here", simple answers yes/no

## 2011-12-28 NOTE — ED Notes (Signed)
Tele consult machine in use on 3A. Will fax ppwrk when machine is available.

## 2011-12-28 NOTE — ED Notes (Signed)
Misty Stanley from 3A states [facility] states they are aware machine is in use and requests ED to go ahead and fax pprwrk. Ppwrk faxed [phone]  and [facility] called [phone] with pt information.

## 2011-12-29 ENCOUNTER — Telehealth: Payer: Self-pay | Admitting: Family Medicine

## 2011-12-29 NOTE — Telephone Encounter (Signed)
He states he is getting his records next week and will bring them here when he gets them

## 2011-12-29 NOTE — Telephone Encounter (Signed)
Tried to call back x 2. Grandmother states he is not there

## 2011-12-31 ENCOUNTER — Encounter: Payer: Self-pay | Admitting: Family Medicine

## 2011-12-31 ENCOUNTER — Ambulatory Visit (INDEPENDENT_AMBULATORY_CARE_PROVIDER_SITE_OTHER): Payer: Medicaid Other | Admitting: Family Medicine

## 2011-12-31 VITALS — BP 120/88 | HR 76 | Resp 16 | Ht 69.0 in | Wt 202.1 lb

## 2011-12-31 DIAGNOSIS — F209 Schizophrenia, unspecified: Secondary | ICD-10-CM

## 2011-12-31 DIAGNOSIS — F329 Major depressive disorder, single episode, unspecified: Secondary | ICD-10-CM

## 2011-12-31 DIAGNOSIS — F411 Generalized anxiety disorder: Secondary | ICD-10-CM

## 2011-12-31 DIAGNOSIS — F3289 Other specified depressive episodes: Secondary | ICD-10-CM

## 2011-12-31 DIAGNOSIS — F1910 Other psychoactive substance abuse, uncomplicated: Secondary | ICD-10-CM

## 2011-12-31 DIAGNOSIS — G8929 Other chronic pain: Secondary | ICD-10-CM

## 2011-12-31 DIAGNOSIS — G4700 Insomnia, unspecified: Secondary | ICD-10-CM

## 2011-12-31 DIAGNOSIS — F419 Anxiety disorder, unspecified: Secondary | ICD-10-CM

## 2011-12-31 DIAGNOSIS — F32A Depression, unspecified: Secondary | ICD-10-CM

## 2011-12-31 MED ORDER — TRAMADOL HCL 50 MG PO TABS: 50.0000 mg | ORAL_TABLET | Freq: Three times a day (TID) | ORAL | Status: DC | PRN

## 2011-12-31 MED ORDER — GABAPENTIN 300 MG PO CAPS: 300.0000 mg | ORAL_CAPSULE | Freq: Three times a day (TID) | ORAL | Status: DC

## 2011-12-31 MED ORDER — DIAZEPAM 5 MG PO TABS: ORAL_TABLET | ORAL | Status: DC

## 2011-12-31 NOTE — Patient Instructions (Signed)
Take your prescriptions by the pharmacy I will send your records to Dr. Laurian Brim in [place] F/U  4 months

## 2012-01-02 ENCOUNTER — Encounter: Payer: Self-pay | Admitting: Family Medicine

## 2012-01-02 NOTE — Progress Notes (Signed)
  Subjective:    Patient ID: Cole [name], male    DOB: 04-Nov-1980, 31 y.o.   MRN: [id number]  HPI  Pt here for follow-up on anxiety. He has been to the ER twice expressing SI, on the last he states he really was homeless because his grandmother kicked him out and he had no where to go. The note states she used cocaine recently but UDS was only positive for his Benzo. He denies any recent use but was agreeing to use in the past. HE has a new psychiatrist in [place] that has him on buspar and zyprexa. He needs a new pain clinic because he was missing appt due to transportation. Would like refill on ultram for now. He was denied for disability, he has been trying to find a job.has been doing lawnwork on the side  Review of Systems  GEN- denies fatigue, fever, weight loss,weakness, recent illness HEENT- denies eye drainage, change in vision, nasal discharge, CVS- denies chest pain, palpitations RESP- denies SOB, cough, wheeze ABD- denies N/V, change in stools, abd pain GU- denies dysuria, hematuria, dribbling, incontinence MSK- + joint pain, muscle aches, injury Neuro- denies headache, dizziness, syncope, seizure activity Psych-+depression      Objective:   Physical Exam GEN- NAD, alert and oriented x3 HEENT- PERRL, EOMI, non injected sclera, pink conjunctiva, MMM, oropharynx clear Neck- Supple,  CVS- RRR, no murmur RESP-CTAB ABD-NABS,soft, NT,ND EXT- No edema Pulses- Radial, DP- 2+ Psych- flat depressed affect, not anxious appearing, no hallucinations, no SI        Assessment & Plan:

## 2012-01-02 NOTE — Assessment & Plan Note (Signed)
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

## 2012-01-02 NOTE — Assessment & Plan Note (Signed)
Continue trazadone

## 2012-01-02 NOTE — Assessment & Plan Note (Signed)
Non recent with exception of alcohol, UDS appropriate, he has been out of narcotics

## 2012-01-02 NOTE — Assessment & Plan Note (Signed)
Valium refilled 

## 2012-01-02 NOTE — Assessment & Plan Note (Signed)
I have received his pain clinic records, will refer to local pain clinic again now that we have records

## 2012-01-02 NOTE — Assessment & Plan Note (Signed)
He does not appear to be stable to me, he has recently established with psych and has an appt coming up in the next few weeks, his social status makes things difficult.

## 2012-01-03 ENCOUNTER — Telehealth: Payer: Self-pay | Admitting: Family Medicine

## 2012-01-03 NOTE — Telephone Encounter (Signed)
noted 

## 2012-01-07 ENCOUNTER — Telehealth: Payer: Self-pay | Admitting: Family Medicine

## 2012-01-07 NOTE — Telephone Encounter (Signed)
Called and left message for pt to return call.  

## 2012-01-13 ENCOUNTER — Telehealth: Payer: Self-pay | Admitting: Family Medicine

## 2012-01-13 NOTE — Telephone Encounter (Signed)
You can send referral to [place] [facility]

## 2012-01-14 ENCOUNTER — Ambulatory Visit: Payer: Medicaid Other | Admitting: Family Medicine

## 2012-01-18 NOTE — Telephone Encounter (Signed)
Has been sent.

## 2012-01-28 ENCOUNTER — Emergency Department (HOSPITAL_COMMUNITY)
Admission: EM | Admit: 2012-01-28 | Discharge: 2012-01-28 | Payer: Medicaid Other | Attending: Emergency Medicine | Admitting: Emergency Medicine

## 2012-01-28 ENCOUNTER — Encounter (HOSPITAL_COMMUNITY): Payer: Self-pay

## 2012-01-28 DIAGNOSIS — F17200 Nicotine dependence, unspecified, uncomplicated: Secondary | ICD-10-CM | POA: Insufficient documentation

## 2012-01-28 DIAGNOSIS — F209 Schizophrenia, unspecified: Secondary | ICD-10-CM | POA: Insufficient documentation

## 2012-01-28 DIAGNOSIS — F329 Major depressive disorder, single episode, unspecified: Secondary | ICD-10-CM | POA: Insufficient documentation

## 2012-01-28 DIAGNOSIS — F411 Generalized anxiety disorder: Secondary | ICD-10-CM | POA: Insufficient documentation

## 2012-01-28 DIAGNOSIS — F3289 Other specified depressive episodes: Secondary | ICD-10-CM | POA: Insufficient documentation

## 2012-01-28 DIAGNOSIS — R45851 Suicidal ideations: Secondary | ICD-10-CM

## 2012-01-28 LAB — DIFFERENTIAL
Basophils Absolute: 0 10*3/uL (ref 0.0–0.1)
Eosinophils Absolute: 0.1 10*3/uL (ref 0.0–0.7)
Eosinophils Relative: 2 % (ref 0–5)
Lymphs Abs: 1.5 10*3/uL (ref 0.7–4.0)
Neutro Abs: 4 10*3/uL (ref 1.7–7.7)
Neutrophils Relative %: 66 % (ref 43–77)

## 2012-01-28 LAB — RAPID URINE DRUG SCREEN, HOSP PERFORMED
Amphetamines: NOT DETECTED
Opiates: NOT DETECTED

## 2012-01-28 LAB — CBC
HCT: 43.3 % (ref 39.0–52.0)
Hemoglobin: 14.2 g/dL (ref 13.0–17.0)
MCH: 23.9 pg — ABNORMAL LOW (ref 26.0–34.0)
MCHC: 32.8 g/dL (ref 30.0–36.0)
RBC: 5.94 MIL/uL — ABNORMAL HIGH (ref 4.22–5.81)

## 2012-01-28 LAB — BASIC METABOLIC PANEL
BUN: 16 mg/dL (ref 6–23)
Chloride: 100 mEq/L (ref 96–112)
GFR calc Af Amer: >90 mL/min (ref >90)
Glucose, Bld: 93 mg/dL (ref 70–99)
Potassium: 3.5 mEq/L (ref 3.5–5.1)
Sodium: 133 mEq/L — ABNORMAL LOW (ref 135–145)

## 2012-01-28 MED ORDER — LORAZEPAM 1 MG PO TABS
1.0000 mg | ORAL_TABLET | Freq: Once | ORAL | Status: AC
  Administered 2012-01-28: 1 mg via ORAL
  Filled 2012-01-28: qty 1

## 2012-01-28 NOTE — ED Notes (Signed)
Patient is asleep.  
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
## 2012-01-28 NOTE — ED Notes (Signed)
Evaluation by Hattie Perch ACT

## 2012-01-28 NOTE — ED Notes (Signed)
Paged [facility]on Frederic for consult per Dr. Deretha Emory

## 2012-01-28 NOTE — ED Notes (Signed)
Patient refuses to get a  Urine specimen, states he cannot void.

## 2012-01-28 NOTE — Discharge Instructions (Signed)
Patient is currently under arrest discharged into the custody of the [facility]. He will be taken to jail on suicide watch. He has been evaluated by her behavior health team and is cleared to go to jail on suicide watch.
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
## 2012-01-28 NOTE — ED Notes (Signed)
Per EMS and Police report. Pt was arrested on outstanding warrants. When taking to the magistrates office for processing and told he was going to jail, he became combative and rolling around in the floor. Pt was brought here to be eval before taking to jail

## 2012-01-31 ENCOUNTER — Ambulatory Visit: Payer: Medicaid Other | Admitting: Family Medicine

## 2012-01-31 ENCOUNTER — Encounter: Payer: Self-pay | Admitting: Family Medicine

## 2012-02-01 ENCOUNTER — Telehealth: Payer: Self-pay | Admitting: Family Medicine

## 2012-02-01 MED ORDER — DIAZEPAM 5 MG PO TABS: ORAL_TABLET | ORAL | Status: DC

## 2012-02-01 MED ORDER — TRAMADOL HCL 50 MG PO TABS: 50.0000 mg | ORAL_TABLET | Freq: Three times a day (TID) | ORAL | Status: DC | PRN

## 2012-02-01 MED ORDER — TRAZODONE HCL 100 MG PO TABS: ORAL_TABLET | ORAL | Status: DC

## 2012-02-01 NOTE — Telephone Encounter (Signed)
Pt called he is incarcerated and needs his medications. I will send those that I fill for him.

## 2012-05-09 ENCOUNTER — Ambulatory Visit: Payer: Medicaid Other | Admitting: Family Medicine

## 2013-04-23 ENCOUNTER — Telehealth: Payer: Self-pay | Admitting: Family Medicine

## 2013-04-23 ENCOUNTER — Encounter: Payer: Self-pay | Admitting: Family Medicine

## 2013-04-23 NOTE — Telephone Encounter (Signed)
Pt has appt here on Wednesday August 27 at 8AM with Dr Jeanice Lim.  Needs [place] to fax transportation services to confirm his need  [facility]  [phone] Letter sent.

## 2013-04-25 ENCOUNTER — Ambulatory Visit: Payer: Self-pay | Admitting: Family Medicine

## 2013-05-02 ENCOUNTER — Ambulatory Visit: Payer: Self-pay | Admitting: Family Medicine

## 2013-05-04 ENCOUNTER — Encounter (HOSPITAL_COMMUNITY): Payer: Self-pay | Admitting: Emergency Medicine

## 2013-05-04 ENCOUNTER — Emergency Department (HOSPITAL_COMMUNITY)
Admission: EM | Admit: 2013-05-04 | Discharge: 2013-05-04 | Disposition: A | Discharge status: Home / Self Care | Payer: Medicaid Other | Attending: Emergency Medicine | Admitting: Emergency Medicine

## 2013-05-04 ENCOUNTER — Emergency Department (HOSPITAL_COMMUNITY): Payer: Medicaid Other

## 2013-05-04 DIAGNOSIS — S0100XA Unspecified open wound of scalp, initial encounter: Secondary | ICD-10-CM | POA: Insufficient documentation

## 2013-05-04 DIAGNOSIS — F17200 Nicotine dependence, unspecified, uncomplicated: Secondary | ICD-10-CM | POA: Insufficient documentation

## 2013-05-04 DIAGNOSIS — G609 Hereditary and idiopathic neuropathy, unspecified: Secondary | ICD-10-CM | POA: Insufficient documentation

## 2013-05-04 DIAGNOSIS — J45909 Unspecified asthma, uncomplicated: Secondary | ICD-10-CM | POA: Insufficient documentation

## 2013-05-04 DIAGNOSIS — W2209XA Striking against other stationary object, initial encounter: Secondary | ICD-10-CM | POA: Insufficient documentation

## 2013-05-04 DIAGNOSIS — Y9289 Other specified places as the place of occurrence of the external cause: Secondary | ICD-10-CM | POA: Insufficient documentation

## 2013-05-04 DIAGNOSIS — F329 Major depressive disorder, single episode, unspecified: Secondary | ICD-10-CM | POA: Insufficient documentation

## 2013-05-04 DIAGNOSIS — Z79899 Other long term (current) drug therapy: Secondary | ICD-10-CM | POA: Insufficient documentation

## 2013-05-04 DIAGNOSIS — R269 Unspecified abnormalities of gait and mobility: Secondary | ICD-10-CM | POA: Insufficient documentation

## 2013-05-04 DIAGNOSIS — S0101XA Laceration without foreign body of scalp, initial encounter: Secondary | ICD-10-CM

## 2013-05-04 DIAGNOSIS — F3289 Other specified depressive episodes: Secondary | ICD-10-CM | POA: Insufficient documentation

## 2013-05-04 DIAGNOSIS — Z8659 Personal history of other mental and behavioral disorders: Secondary | ICD-10-CM | POA: Insufficient documentation

## 2013-05-04 DIAGNOSIS — Y9301 Activity, walking, marching and hiking: Secondary | ICD-10-CM | POA: Insufficient documentation

## 2013-05-04 DIAGNOSIS — F411 Generalized anxiety disorder: Secondary | ICD-10-CM | POA: Insufficient documentation

## 2013-05-04 MED ORDER — BACITRACIN ZINC 500 UNIT/GM EX OINT
TOPICAL_OINTMENT | CUTANEOUS | Status: AC
  Administered 2013-05-04: 1
  Filled 2013-05-04: qty 2.7

## 2013-05-04 MED ORDER — TRAMADOL HCL 50 MG PO TABS: 50.0000 mg | ORAL_TABLET | Freq: Four times a day (QID) | ORAL | Status: DC | PRN

## 2013-05-04 MED ORDER — NAPROXEN 500 MG PO TABS: 500.0000 mg | ORAL_TABLET | Freq: Two times a day (BID) | ORAL | Status: DC

## 2013-05-04 MED ORDER — KETOROLAC TROMETHAMINE 10 MG PO TABS
10.0000 mg | ORAL_TABLET | Freq: Once | ORAL | Status: AC
  Administered 2013-05-04: 10 mg via ORAL
  Filled 2013-05-04: qty 1
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
## 2013-05-04 NOTE — ED Notes (Signed)
X-ray d/c per edp .
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
## 2013-05-20 ENCOUNTER — Emergency Department (HOSPITAL_COMMUNITY)
Admission: EM | Admit: 2013-05-20 | Discharge: 2013-05-20 | Disposition: A | Discharge status: Home / Self Care | Payer: Medicaid Other | Attending: Emergency Medicine | Admitting: Emergency Medicine

## 2013-05-20 ENCOUNTER — Encounter (HOSPITAL_COMMUNITY): Payer: Self-pay | Admitting: *Deleted

## 2013-05-20 DIAGNOSIS — F411 Generalized anxiety disorder: Secondary | ICD-10-CM | POA: Insufficient documentation

## 2013-05-20 DIAGNOSIS — Z8669 Personal history of other diseases of the nervous system and sense organs: Secondary | ICD-10-CM | POA: Insufficient documentation

## 2013-05-20 DIAGNOSIS — F209 Schizophrenia, unspecified: Secondary | ICD-10-CM | POA: Insufficient documentation

## 2013-05-20 DIAGNOSIS — F17200 Nicotine dependence, unspecified, uncomplicated: Secondary | ICD-10-CM | POA: Insufficient documentation

## 2013-05-20 DIAGNOSIS — Z791 Long term (current) use of non-steroidal anti-inflammatories (NSAID): Secondary | ICD-10-CM | POA: Insufficient documentation

## 2013-05-20 DIAGNOSIS — Z79899 Other long term (current) drug therapy: Secondary | ICD-10-CM | POA: Insufficient documentation

## 2013-05-20 DIAGNOSIS — J45909 Unspecified asthma, uncomplicated: Secondary | ICD-10-CM | POA: Insufficient documentation

## 2013-05-20 DIAGNOSIS — F3289 Other specified depressive episodes: Secondary | ICD-10-CM | POA: Insufficient documentation

## 2013-05-20 DIAGNOSIS — F329 Major depressive disorder, single episode, unspecified: Secondary | ICD-10-CM | POA: Insufficient documentation

## 2013-05-20 DIAGNOSIS — Z4802 Encounter for removal of sutures: Secondary | ICD-10-CM | POA: Insufficient documentation

## 2013-05-20 DIAGNOSIS — Z87828 Personal history of other (healed) physical injury and trauma: Secondary | ICD-10-CM | POA: Insufficient documentation

## 2013-05-20 MED ORDER — IBUPROFEN 800 MG PO TABS
800.0000 mg | ORAL_TABLET | Freq: Once | ORAL | Status: AC
  Administered 2013-05-20: 800 mg via ORAL
  Filled 2013-05-20: qty 1

## 2013-05-20 NOTE — ED Notes (Signed)
Here for staple removal from scalp, removed by PA, without problem

## 2013-05-20 NOTE — ED Notes (Signed)
Here for staple removal from scalp.
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
## 2013-05-21 NOTE — ED Provider Notes (Signed)
Medical screening examination/treatment/procedure(s) were performed by non-physician practitioner and as supervising physician I was immediately available for consultation/collaboration.   Lorice Lafave M Jhamir Pickup, MD 05/21/13 1705 

## 2013-06-20 ENCOUNTER — Inpatient Hospital Stay: Payer: Self-pay | Admitting: Psychiatry

## 2013-06-20 LAB — ETHANOL: Ethanol %: <0.003 % (ref 0.000–0.080)

## 2013-06-20 LAB — COMPREHENSIVE METABOLIC PANEL
Albumin: 4.7 g/dL (ref 3.4–5.0)
BUN: 16 mg/dL (ref 7–18)
Bilirubin,Total: 0.9 mg/dL (ref 0.2–1.0)
Calcium, Total: 10 mg/dL (ref 8.5–10.1)
Chloride: 106 mmol/L (ref 98–107)
Osmolality: 276 (ref 275–301)
Potassium: 3.2 mmol/L — ABNORMAL LOW (ref 3.5–5.1)
SGOT(AST): 39 U/L — ABNORMAL HIGH (ref 15–37)
SGPT (ALT): 40 U/L (ref 12–78)
Sodium: 138 mmol/L (ref 136–145)
Total Protein: 9.5 g/dL — ABNORMAL HIGH (ref 6.4–8.2)

## 2013-06-20 LAB — URINALYSIS, COMPLETE
Bacteria: NONE SEEN
Blood: NEGATIVE
Hyaline Cast: 240
Leukocyte Esterase: NEGATIVE
Nitrite: NEGATIVE
Ph: 5 (ref 4.5–8.0)
Protein: 100
WBC UR: 6 /HPF (ref 0–5)

## 2013-06-20 LAB — TSH: Thyroid Stimulating Horm: 1.69 u[IU]/mL

## 2013-06-20 LAB — DRUG SCREEN, URINE
Amphetamines, Ur Screen: POSITIVE (ref ?–1000)
Barbiturates, Ur Screen: NEGATIVE (ref ?–200)
Cocaine Metabolite,Ur ~~LOC~~: POSITIVE (ref ?–300)
Opiate, Ur Screen: NEGATIVE (ref ?–300)
Phencyclidine (PCP) Ur S: NEGATIVE (ref ?–25)
Tricyclic, Ur Screen: NEGATIVE (ref ?–1000)

## 2013-06-20 LAB — CBC
HGB: 16.4 g/dL (ref 13.0–18.0)
Platelet: 190 10*3/uL (ref 150–440)
RBC: 6.45 10*6/uL — ABNORMAL HIGH (ref 4.40–5.90)
RDW: 14.4 % (ref 11.5–14.5)
WBC: 14.8 10*3/uL — ABNORMAL HIGH (ref 3.8–10.6)

## 2013-06-20 LAB — ACETAMINOPHEN LEVEL: Acetaminophen: <2 ug/mL

## 2013-06-21 LAB — BEHAVIORAL MEDICINE 1 PANEL
Alkaline Phosphatase: 62 U/L (ref 50–136)
Anion Gap: 5 — ABNORMAL LOW (ref 7–16)
BUN: 15 mg/dL (ref 7–18)
Basophil #: 0 10*3/uL (ref 0.0–0.1)
Basophil %: 0.6 %
Calcium, Total: 9 mg/dL (ref 8.5–10.1)
Chloride: 107 mmol/L (ref 98–107)
Co2: 26 mmol/L (ref 21–32)
Creatinine: 1.26 mg/dL (ref 0.60–1.30)
EGFR (African American): >60
EGFR (Non-African Amer.): >60
Eosinophil %: 2.4 %
HGB: 15.4 g/dL (ref 13.0–18.0)
Lymphocyte %: 21.2 %
MCHC: 33.3 g/dL (ref 32.0–36.0)
MCV: 77 fL — ABNORMAL LOW (ref 80–100)
Monocyte #: 0.7 x10 3/mm (ref 0.2–1.0)
Neutrophil #: 2.3 10*3/uL (ref 1.4–6.5)
Platelet: 147 10*3/uL — ABNORMAL LOW (ref 150–440)
Potassium: 3.8 mmol/L (ref 3.5–5.1)

## 2013-06-22 ENCOUNTER — Telehealth (HOSPITAL_COMMUNITY): Payer: Self-pay

## 2013-06-26 ENCOUNTER — Ambulatory Visit (HOSPITAL_COMMUNITY): Payer: Medicaid Other | Admitting: Physical Therapy

## 2013-06-28 ENCOUNTER — Ambulatory Visit (HOSPITAL_COMMUNITY): Payer: Medicaid Other | Admitting: Physical Therapy

## 2013-07-02 ENCOUNTER — Ambulatory Visit (HOSPITAL_COMMUNITY): Payer: Medicaid Other | Admitting: Physical Therapy

## 2013-07-05 ENCOUNTER — Ambulatory Visit (HOSPITAL_COMMUNITY): Payer: Medicaid Other | Admitting: Physical Therapy

## 2013-07-13 ENCOUNTER — Emergency Department (HOSPITAL_COMMUNITY)
Admission: EM | Admit: 2013-07-13 | Discharge: 2013-07-13 | Disposition: A | Discharge status: Home / Self Care | Payer: Medicaid Other | Attending: Emergency Medicine | Admitting: Emergency Medicine

## 2013-07-13 ENCOUNTER — Encounter (HOSPITAL_COMMUNITY): Payer: Self-pay | Admitting: Emergency Medicine

## 2013-07-13 DIAGNOSIS — F17200 Nicotine dependence, unspecified, uncomplicated: Secondary | ICD-10-CM | POA: Insufficient documentation

## 2013-07-13 DIAGNOSIS — G8929 Other chronic pain: Secondary | ICD-10-CM | POA: Insufficient documentation

## 2013-07-13 DIAGNOSIS — J45909 Unspecified asthma, uncomplicated: Secondary | ICD-10-CM | POA: Insufficient documentation

## 2013-07-13 DIAGNOSIS — F209 Schizophrenia, unspecified: Secondary | ICD-10-CM | POA: Insufficient documentation

## 2013-07-13 DIAGNOSIS — M5380 Other specified dorsopathies, site unspecified: Secondary | ICD-10-CM | POA: Insufficient documentation

## 2013-07-13 DIAGNOSIS — F3289 Other specified depressive episodes: Secondary | ICD-10-CM | POA: Insufficient documentation

## 2013-07-13 DIAGNOSIS — IMO0002 Reserved for concepts with insufficient information to code with codable children: Secondary | ICD-10-CM | POA: Insufficient documentation

## 2013-07-13 DIAGNOSIS — F411 Generalized anxiety disorder: Secondary | ICD-10-CM | POA: Insufficient documentation

## 2013-07-13 DIAGNOSIS — F1021 Alcohol dependence, in remission: Secondary | ICD-10-CM | POA: Insufficient documentation

## 2013-07-13 DIAGNOSIS — Z791 Long term (current) use of non-steroidal anti-inflammatories (NSAID): Secondary | ICD-10-CM | POA: Insufficient documentation

## 2013-07-13 DIAGNOSIS — M545 Low back pain, unspecified: Secondary | ICD-10-CM | POA: Insufficient documentation

## 2013-07-13 DIAGNOSIS — L02412 Cutaneous abscess of left axilla: Secondary | ICD-10-CM

## 2013-07-13 DIAGNOSIS — F329 Major depressive disorder, single episode, unspecified: Secondary | ICD-10-CM | POA: Insufficient documentation

## 2013-07-13 DIAGNOSIS — Z79899 Other long term (current) drug therapy: Secondary | ICD-10-CM | POA: Insufficient documentation

## 2013-07-13 MED ORDER — LIDOCAINE HCL (PF) 1 % IJ SOLN
5.0000 mL | Freq: Once | INTRAMUSCULAR | Status: AC
  Administered 2013-07-13: 5 mL

## 2013-07-13 MED ORDER — SULFAMETHOXAZOLE-TRIMETHOPRIM 800-160 MG PO TABS: 1.0000 | ORAL_TABLET | Freq: Two times a day (BID) | ORAL | Status: AC

## 2013-07-13 MED ORDER — LIDOCAINE HCL (PF) 1 % IJ SOLN
INTRAMUSCULAR | Status: AC
  Filled 2013-07-13: qty 5

## 2013-07-13 MED ORDER — HYDROCODONE-ACETAMINOPHEN 5-325 MG PO TABS: 1.0000 | ORAL_TABLET | ORAL | Status: DC | PRN

## 2013-07-13 NOTE — ED Notes (Signed)
Applied telfa and covered with 4x4s and tape

## 2013-07-13 NOTE — Discharge Instructions (Signed)
Return in 2 days for packing removal. Return sooner for problems.  Abscess Care After An abscess (also called a boil or furuncle) is an infected area that contains a collection of pus. Signs and symptoms of an abscess include pain, tenderness, redness, or hardness, or you may feel a moveable soft area under your skin. An abscess can occur anywhere in the body. The infection may spread to surrounding tissues causing cellulitis. A cut (incision) by the surgeon was made over your abscess and the pus was drained out. Gauze may have been packed into the space to provide a drain that will allow the cavity to heal from the inside outwards. The boil may be painful for 5 to 7 days. Most people with a boil do not have high fevers. Your abscess, if seen early, may not have localized, and may not have been lanced. If not, another appointment may be required for this if it does not get better on its own or with medications. HOME CARE INSTRUCTIONS   Only take over-the-counter or prescription medicines for pain, discomfort, or fever as directed by your caregiver.  When you bathe, soak and then remove gauze or iodoform packs at least daily or as directed by your caregiver. You may then wash the wound gently with mild soapy water. Repack with gauze or do as your caregiver directs. SEEK IMMEDIATE MEDICAL CARE IF:   You develop increased pain, swelling, redness, drainage, or bleeding in the wound site.  You develop signs of generalized infection including muscle aches, chills, fever, or a general ill feeling.  An oral temperature above 102 F (38.9 C) develops, not controlled by medication. See your caregiver for a recheck if you develop any of the symptoms described above. If medications (antibiotics) were prescribed, take them as directed. Document Released: 03/04/2005 Document Revised: 11/08/2011 Document Reviewed: 10/30/2007 [facility] Patient Information 2014 [place], [place].
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
## 2013-07-13 NOTE — ED Notes (Signed)
Pain rt lower back with radiation down rt leg, and abscess lt axilla.  Says his is supposed to go to a pain clinic in 2 weeks.

## 2013-07-13 NOTE — ED Notes (Signed)
Pt reports has chronic lower back pain that radiates down r leg.  Reports pain flared up 2 days ago.  Also reports abscess under left arm x 2 days.  Area raised and tender.

## 2013-07-15 ENCOUNTER — Encounter (HOSPITAL_COMMUNITY): Payer: Self-pay | Admitting: Emergency Medicine

## 2013-07-15 ENCOUNTER — Emergency Department (HOSPITAL_COMMUNITY)
Admission: EM | Admit: 2013-07-15 | Discharge: 2013-07-17 | Disposition: A | Discharge status: Other Acute Inpatient Hospital | Payer: Medicaid Other | Source: Home / Self Care | Attending: Emergency Medicine | Admitting: Emergency Medicine

## 2013-07-15 DIAGNOSIS — F1010 Alcohol abuse, uncomplicated: Secondary | ICD-10-CM | POA: Insufficient documentation

## 2013-07-15 DIAGNOSIS — F17200 Nicotine dependence, unspecified, uncomplicated: Secondary | ICD-10-CM | POA: Insufficient documentation

## 2013-07-15 DIAGNOSIS — J45909 Unspecified asthma, uncomplicated: Secondary | ICD-10-CM | POA: Insufficient documentation

## 2013-07-15 DIAGNOSIS — F209 Schizophrenia, unspecified: Secondary | ICD-10-CM

## 2013-07-15 DIAGNOSIS — F1210 Cannabis abuse, uncomplicated: Secondary | ICD-10-CM | POA: Insufficient documentation

## 2013-07-15 DIAGNOSIS — F411 Generalized anxiety disorder: Secondary | ICD-10-CM | POA: Insufficient documentation

## 2013-07-15 DIAGNOSIS — F1410 Cocaine abuse, uncomplicated: Secondary | ICD-10-CM | POA: Insufficient documentation

## 2013-07-15 DIAGNOSIS — Z8669 Personal history of other diseases of the nervous system and sense organs: Secondary | ICD-10-CM | POA: Insufficient documentation

## 2013-07-15 LAB — COMPREHENSIVE METABOLIC PANEL
BUN: 7 mg/dL (ref 6–23)
Calcium: 9.4 mg/dL (ref 8.4–10.5)
GFR calc Af Amer: >90 mL/min (ref >90)
Glucose, Bld: 107 mg/dL — ABNORMAL HIGH (ref 70–99)
Total Protein: 7.9 g/dL (ref 6.0–8.3)

## 2013-07-15 LAB — CBC WITH DIFFERENTIAL/PLATELET
Eosinophils Absolute: 0.1 10*3/uL (ref 0.0–0.7)
Eosinophils Relative: 1 % (ref 0–5)
Hemoglobin: 14.4 g/dL (ref 13.0–17.0)
Lymphs Abs: 1.7 10*3/uL (ref 0.7–4.0)
MCH: 25.6 pg — ABNORMAL LOW (ref 26.0–34.0)
MCV: 75.1 fL — ABNORMAL LOW (ref 78.0–100.0)
Monocytes Absolute: 0.7 10*3/uL (ref 0.1–1.0)
Monocytes Relative: 11 % (ref 3–12)
RBC: 5.63 MIL/uL (ref 4.22–5.81)

## 2013-07-15 LAB — ACETAMINOPHEN LEVEL: Acetaminophen (Tylenol), Serum: <15 ug/mL (ref 10–30)

## 2013-07-15 LAB — ETHANOL: Alcohol, Ethyl (B): <11 mg/dL (ref 0–11)

## 2013-07-15 MED ORDER — LORAZEPAM 1 MG PO TABS
1.0000 mg | ORAL_TABLET | Freq: Three times a day (TID) | ORAL | Status: DC | PRN
  Administered 2013-07-16: 1 mg via ORAL
  Filled 2013-07-15: qty 1

## 2013-07-15 MED ORDER — ZOLPIDEM TARTRATE 5 MG PO TABS
5.0000 mg | ORAL_TABLET | Freq: Every evening | ORAL | Status: DC | PRN
  Administered 2013-07-16: 5 mg via ORAL
  Filled 2013-07-15: qty 1

## 2013-07-15 MED ORDER — NICOTINE 21 MG/24HR TD PT24
21.0000 mg | MEDICATED_PATCH | Freq: Every day | TRANSDERMAL | Status: DC
  Administered 2013-07-16 – 2013-07-17 (×3): 21 mg via TRANSDERMAL
  Filled 2013-07-15 (×3): qty 1

## 2013-07-15 MED ORDER — ACETAMINOPHEN 325 MG PO TABS
650.0000 mg | ORAL_TABLET | ORAL | Status: DC | PRN
  Administered 2013-07-16 – 2013-07-17 (×2): 650 mg via ORAL
  Filled 2013-07-15 (×2): qty 2

## 2013-07-15 MED ORDER — IBUPROFEN 400 MG PO TABS
600.0000 mg | ORAL_TABLET | Freq: Three times a day (TID) | ORAL | Status: DC | PRN
  Administered 2013-07-16 – 2013-07-17 (×2): 600 mg via ORAL
  Filled 2013-07-15 (×2): qty 2

## 2013-07-15 MED ORDER — ONDANSETRON HCL 4 MG PO TABS: 4.0000 mg | ORAL_TABLET | Freq: Three times a day (TID) | ORAL | Status: DC | PRN

## 2013-07-15 MED ORDER — ALUM & MAG HYDROXIDE-SIMETH 200-200-20 MG/5ML PO SUSP: 30.0000 mL | ORAL | Status: DC | PRN
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
## 2013-07-15 NOTE — ED Notes (Signed)
[facility] ems called out for SI ideation with apparent threats to hurt himself.  Pt also reportedly may have taken some meds although family report that pt only had access to some antibiotics.  Pt is awake and alert but will not answer questions.

## 2013-07-16 LAB — URINALYSIS, ROUTINE W REFLEX MICROSCOPIC
Glucose, UA: NEGATIVE mg/dL
Ketones, ur: NEGATIVE mg/dL
Leukocytes, UA: NEGATIVE
Specific Gravity, Urine: 1.025 (ref 1.005–1.030)
pH: 7 (ref 5.0–8.0)

## 2013-07-16 LAB — RAPID URINE DRUG SCREEN, HOSP PERFORMED
Benzodiazepines: NOT DETECTED
Cocaine: NOT DETECTED

## 2013-07-16 MED ORDER — IBUPROFEN 800 MG PO TABS
800.0000 mg | ORAL_TABLET | Freq: Four times a day (QID) | ORAL | Status: DC | PRN
  Administered 2013-07-16: 800 mg via ORAL
  Filled 2013-07-16: qty 1

## 2013-07-16 MED ORDER — SULFAMETHOXAZOLE-TMP DS 800-160 MG PO TABS
1.0000 | ORAL_TABLET | Freq: Two times a day (BID) | ORAL | Status: DC
  Administered 2013-07-16 – 2013-07-17 (×3): 1 via ORAL
  Filled 2013-07-16 (×3): qty 1
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
## 2013-07-16 NOTE — Consult Note (Signed)
  Requested to review pt by TTS for schizophrenic breakdown after pt stopped taking Meds.Of note he had axillary abscess I&D'd 11/14 with packing to be removed 11/16-This will need to addressed before he can come to [facility].Otherwise pt accepted pending bed to [address]-does not appear to need private room.Cole [name]

## 2013-07-16 NOTE — ED Notes (Signed)
Spoke with Corrie Dandy at [facility] - states pt has been accepted to [facility] pending a 400 hall bed.  No discharges on [address] at this time with no anticipation of discharges today.

## 2013-07-16 NOTE — Progress Notes (Signed)
Underwriter initatied inpatient hospitalization placement for pt.  The following hospitals were faxed referral with bed availability: 1)[facility] 2)[facility] 3)[facility] 4)[facility] 5)[facility] 6)[facility] 7)[facility]  Blain Pais, MHT/NS

## 2013-07-16 NOTE — ED Notes (Signed)
Gave patient peanut butter and crackers, sprite as requested.

## 2013-07-16 NOTE — ED Notes (Signed)
Pt states, "I need something for psychotic".  When asked to explain further pt states, "I'm starring into space.  I need something for psychotic."  Meal tray given.  Pt lying in bed starring at ceiling.  Does not make eye contact.  Sitter at bedside.

## 2013-07-16 NOTE — ED Notes (Signed)
Asked Dr. Hyacinth Meeker if pt needed a sitter and he said not at this time.

## 2013-07-16 NOTE — ED Notes (Signed)
Patient requesting "something to help me sleep."

## 2013-07-16 NOTE — ED Notes (Signed)
Pt had telepsych and was placed in a psych hold. Asked Dr. Hyacinth Meeker again just to make sure if patient needed a sitter and he said "No" he expressed no SI/HI to me.

## 2013-07-16 NOTE — ED Notes (Signed)
Pt reports he removed packing from left axilla abscess on his own yesterday prior to arrival.  No drainage, redness noted to area.  No s/s of infection.

## 2013-07-16 NOTE — ED Notes (Signed)
Patient requesting medication for pain. Advised patient that he had ibuprofen and tylenol ordered prn for pain. Patient refused medication.

## 2013-07-16 NOTE — ED Notes (Signed)
Pt starring into ceiling, does not make eye contact.  Refuses to have lights off.  Alert and communicating.  Sitter at bedside.

## 2013-07-16 NOTE — BH Assessment (Signed)
Assessment complete. Consulted with Dr. Eber Hong who agrees Pt meets criteria for inpatient psychiatric treatment. Per Laverle Hobby, [facility], adult unit is at capacity. Pt will be considered for [address] when bed becomes available. TTS will contact other facilities for bed availability and transfer.  Harlin Rain Ria Comment, [facility] Triage Specialist

## 2013-07-16 NOTE — ED Notes (Signed)
Pt sitting up in bed eating lunch tray at this time.

## 2013-07-16 NOTE — BH Assessment (Signed)
Received call requesting tele-assessment. Spoke with Dr. Eber Hong who said Pt has schizophrenia and presents with insomnia, racing thoughts, tangential thought process and possible overdose. Tele-assessment will be initiated.  Harlin Rain Ria Comment, [facility] Triage Specialist
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
## 2013-07-17 ENCOUNTER — Ambulatory Visit (HOSPITAL_COMMUNITY): Payer: Medicaid Other | Admitting: Physical Therapy

## 2013-07-17 ENCOUNTER — Encounter (HOSPITAL_COMMUNITY): Payer: Self-pay | Admitting: *Deleted

## 2013-07-17 ENCOUNTER — Inpatient Hospital Stay (HOSPITAL_COMMUNITY)
Admission: AD | Admit: 2013-07-17 | Discharge: 2013-07-24 | DRG: 885 | Disposition: A | Discharge status: Home / Self Care | Payer: Medicaid Other | Source: Intra-hospital | Attending: Psychiatry | Admitting: Psychiatry

## 2013-07-17 DIAGNOSIS — G8929 Other chronic pain: Secondary | ICD-10-CM

## 2013-07-17 DIAGNOSIS — H109 Unspecified conjunctivitis: Secondary | ICD-10-CM

## 2013-07-17 DIAGNOSIS — Z79899 Other long term (current) drug therapy: Secondary | ICD-10-CM

## 2013-07-17 DIAGNOSIS — F200 Paranoid schizophrenia: Principal | ICD-10-CM | POA: Diagnosis present

## 2013-07-17 DIAGNOSIS — F1910 Other psychoactive substance abuse, uncomplicated: Secondary | ICD-10-CM

## 2013-07-17 DIAGNOSIS — F1310 Sedative, hypnotic or anxiolytic abuse, uncomplicated: Secondary | ICD-10-CM | POA: Diagnosis present

## 2013-07-17 DIAGNOSIS — J45909 Unspecified asthma, uncomplicated: Secondary | ICD-10-CM | POA: Diagnosis present

## 2013-07-17 DIAGNOSIS — F209 Schizophrenia, unspecified: Secondary | ICD-10-CM

## 2013-07-17 DIAGNOSIS — F1110 Opioid abuse, uncomplicated: Secondary | ICD-10-CM | POA: Diagnosis present

## 2013-07-17 DIAGNOSIS — G4700 Insomnia, unspecified: Secondary | ICD-10-CM | POA: Diagnosis present

## 2013-07-17 DIAGNOSIS — F1920 Other psychoactive substance dependence, uncomplicated: Secondary | ICD-10-CM

## 2013-07-17 DIAGNOSIS — F419 Anxiety disorder, unspecified: Secondary | ICD-10-CM

## 2013-07-17 DIAGNOSIS — F411 Generalized anxiety disorder: Secondary | ICD-10-CM | POA: Diagnosis present

## 2013-07-17 DIAGNOSIS — F329 Major depressive disorder, single episode, unspecified: Secondary | ICD-10-CM

## 2013-07-17 MED ORDER — MAGNESIUM HYDROXIDE 400 MG/5ML PO SUSP: 30.0000 mL | Freq: Every day | ORAL | Status: DC | PRN

## 2013-07-17 MED ORDER — IBUPROFEN 200 MG PO TABS
600.0000 mg | ORAL_TABLET | Freq: Three times a day (TID) | ORAL | Status: DC | PRN
  Administered 2013-07-18 – 2013-07-19 (×2): 600 mg via ORAL
  Filled 2013-07-17 (×2): qty 3

## 2013-07-17 MED ORDER — PNEUMOCOCCAL VAC POLYVALENT 25 MCG/0.5ML IJ INJ
0.5000 mL | INJECTION | INTRAMUSCULAR | Status: AC
  Administered 2013-07-18: 0.5 mL via INTRAMUSCULAR

## 2013-07-17 MED ORDER — NICOTINE 21 MG/24HR TD PT24
21.0000 mg | MEDICATED_PATCH | Freq: Every day | TRANSDERMAL | Status: DC
  Administered 2013-07-18 – 2013-07-24 (×7): 21 mg via TRANSDERMAL
  Filled 2013-07-17 (×8): qty 1

## 2013-07-17 MED ORDER — ALUM & MAG HYDROXIDE-SIMETH 200-200-20 MG/5ML PO SUSP: 30.0000 mL | ORAL | Status: DC | PRN

## 2013-07-17 MED ORDER — INFLUENZA VAC SPLIT QUAD 0.5 ML IM SUSP
0.5000 mL | INTRAMUSCULAR | Status: AC
  Administered 2013-07-18: 0.5 mL via INTRAMUSCULAR
  Filled 2013-07-17: qty 0.5

## 2013-07-17 MED ORDER — ONDANSETRON HCL 4 MG PO TABS
4.0000 mg | ORAL_TABLET | Freq: Three times a day (TID) | ORAL | Status: DC | PRN
  Administered 2013-07-20: 4 mg via ORAL
  Filled 2013-07-17 (×2): qty 1

## 2013-07-17 MED ORDER — ACETAMINOPHEN 325 MG PO TABS
650.0000 mg | ORAL_TABLET | ORAL | Status: DC | PRN
  Administered 2013-07-19: 650 mg via ORAL
  Filled 2013-07-17: qty 2

## 2013-07-17 MED ORDER — HYDROXYZINE HCL 50 MG PO TABS
50.0000 mg | ORAL_TABLET | Freq: Every evening | ORAL | Status: DC | PRN
  Administered 2013-07-17: 50 mg via ORAL
  Filled 2013-07-17: qty 1

## 2013-07-17 NOTE — Tx Team (Signed)
Initial Interdisciplinary Treatment Plan  PATIENT STRENGTHS: (choose at least two) General fund of knowledge Physical Health  PATIENT STRESSORS: Legal issue Substance abuse   PROBLEM LIST: Problem List/Patient Goals Date to be addressed Date deferred Reason deferred Estimated date of resolution  "social skills 07/17/13     "mood"  07/17/13                                                DISCHARGE CRITERIA:  Adequate post-discharge living arrangements Improved stabilization in mood, thinking, and/or behavior Need for constant or close observation no longer present Safe-care adequate arrangements made Verbal commitment to aftercare and medication compliance  PRELIMINARY DISCHARGE PLAN: Attend aftercare/continuing care group Outpatient therapy  PATIENT/FAMIILY INVOLVEMENT: This treatment plan has been presented to and reviewed with the patient, Cole [name], and/or family member,   The patient and family have been given the opportunity to ask questions and make suggestions.  Beatrix Shipper 07/17/2013, 6:23 PM

## 2013-07-17 NOTE — ED Notes (Addendum)
Pt c/o chronic lower back pain and states needs something other than tylenol and motrin because it doesn't work. Advised could not give a narcotic. Pt states tylenol and motrin will be fine. Pt advised awaiting bed at [facility] and has already been accepted.

## 2013-07-17 NOTE — ED Provider Notes (Signed)
Medical screening examination/treatment/procedure(s) were performed by non-physician practitioner and as supervising physician I was immediately available for consultation/collaboration.  EKG Interpretation   None         Roney Marion, MD 07/17/13 [phone]

## 2013-07-17 NOTE — Progress Notes (Signed)
Adult Psychoeducational Group Note  Date:  07/17/2013 Time:  8:00 pm  Group Topic/Focus:  Wrap-Up Group:   The focus of this group is to help patients review their daily goal of treatment and discuss progress on daily workbooks.  Participation Level:  Active  Participation Quality:  Appropriate and Sharing  Affect:  Appropriate  Cognitive:  Appropriate  Insight: Appropriate  Engagement in Group:  Engaged  Modes of Intervention:  Discussion, Education, Socialization and Support  Additional Comments:  Pt stated that he is in the hospital because of family issues and to get off drugs. Pt stated that  enjoys cooking and watching TV.    Laural Benes, Fatumata Kashani 07/17/2013, 11:29 PM

## 2013-07-17 NOTE — ED Notes (Signed)
Sitter with pt in shower
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
## 2013-07-18 DIAGNOSIS — F1920 Other psychoactive substance dependence, uncomplicated: Secondary | ICD-10-CM | POA: Diagnosis present

## 2013-07-18 DIAGNOSIS — F200 Paranoid schizophrenia: Principal | ICD-10-CM

## 2013-07-18 DIAGNOSIS — F1910 Other psychoactive substance abuse, uncomplicated: Secondary | ICD-10-CM

## 2013-07-18 MED ORDER — TRAZODONE HCL 50 MG PO TABS
50.0000 mg | ORAL_TABLET | Freq: Every evening | ORAL | Status: DC | PRN
  Administered 2013-07-18 – 2013-07-19 (×2): 50 mg via ORAL
  Filled 2013-07-18: qty 1

## 2013-07-18 MED ORDER — CARBAMAZEPINE ER 200 MG PO TB12
200.0000 mg | ORAL_TABLET | Freq: Two times a day (BID) | ORAL | Status: DC
  Administered 2013-07-18 – 2013-07-24 (×13): 200 mg via ORAL
  Filled 2013-07-18 (×16): qty 1

## 2013-07-18 MED ORDER — BENZTROPINE MESYLATE 1 MG PO TABS
1.0000 mg | ORAL_TABLET | Freq: Two times a day (BID) | ORAL | Status: DC
  Administered 2013-07-18 – 2013-07-24 (×13): 1 mg via ORAL
  Filled 2013-07-18 (×16): qty 1

## 2013-07-18 MED ORDER — HYDROXYZINE HCL 50 MG PO TABS
50.0000 mg | ORAL_TABLET | Freq: Three times a day (TID) | ORAL | Status: DC | PRN
  Administered 2013-07-18 – 2013-07-23 (×5): 50 mg via ORAL
  Filled 2013-07-18 (×6): qty 1

## 2013-07-18 MED ORDER — OLANZAPINE 10 MG PO TBDP
10.0000 mg | ORAL_TABLET | Freq: Three times a day (TID) | ORAL | Status: DC | PRN
  Administered 2013-07-20: 10 mg via ORAL
  Filled 2013-07-18 (×2): qty 1

## 2013-07-18 MED ORDER — HALOPERIDOL 5 MG PO TABS
5.0000 mg | ORAL_TABLET | Freq: Two times a day (BID) | ORAL | Status: DC
Start: 2013-07-18 — End: 2013-07-24
  Administered 2013-07-18 – 2013-07-24 (×13): 5 mg via ORAL
  Filled 2013-07-18 (×16): qty 1
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
## 2013-07-18 NOTE — Progress Notes (Signed)
D: Pt denies SI/HI/AVH. Pt stated that he is depressed and trying to become a better person. Pt has poor insight for treatment and stated that he is here to detox off heroin. Pt present with flat affect, depressed mood and minimal interaction. Pt compliant with taking meds and attending groups. A: Medication administered as ordered per MD. Verbal support given. Pt encourage to attend groups. 15 minute checks performed for safety. R: Pt safety maintained.

## 2013-07-18 NOTE — [facility] Group Notes (Signed)
[facility] Group Therapy  07/18/2013  1:03 PM  Type of Therapy:  [facility] Presentation   Participation Level:  Minimal  Participation Quality:  Drowsy  Affect:  Flat  Cognitive:  Lacking  Insight:  Poor  Engagement in Therapy:  Poor  Modes of Intervention:  Discussion, Education and Socialization   Summary of Progress/Problems:  Onalee Hua from [facility] came to present his recovery story and play the guitar.  Rhyse kept on coming in and out of group.  Eventually, he sat by the window and fell asleep until the end of group.  He did not engaged with the speaker.    Simona Huh   07/18/2013  1:03 PM

## 2013-07-18 NOTE — Progress Notes (Signed)
Adult Psychoeducational Group Note  Date:  07/18/2013 Time:  10:57 PM  Group Topic/Focus:  Wrap-Up Group:   The focus of this group is to help patients review their daily goal of treatment and discuss progress on daily workbooks.  Participation Level:  Minimal  Participation Quality:  Drowsy  Affect:  Flat  Cognitive:  Oriented  Insight: Lacking  Engagement in Group:  Poor  Modes of Intervention:  Support  Additional Comments:  Patient attended group however was very drowsy. He stated that he did not rest much last night. The patient reports having an OK day. He talked to his doctor and socialized. He fell off to sleep.  Lita Mains [facility] 07/18/2013, 10:57 PM
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
## 2013-07-18 NOTE — Tx Team (Signed)
  Interdisciplinary Treatment Plan Update   Date Reviewed: 07/18/2013 Time Reviewed:  8:36 AM  Progress in Treatment:   Attending groups: Yes Participating in groups: Yes Taking medication as prescribed: Yes  Tolerating medication: Yes Family/Significant other contact made: No  Unable to leave message for pt's mother  Patient understands diagnosis: Yes, AEB asking for help with paranoia and anxiety Discussing patient identified problems/goals with staff: Yes See initial care plan Medical problems stabilized or resolved: Yes Denies suicidal/homicidal ideation: Yes In tx team Patient has not harmed self or others: Yes For review of initial/current patient goals, please see plan of care.  Estimated Length of Stay:  4-5 days  Reasons for Continued Hospitalization:  Paranoid thoughts  Anxiety  Mood stabilization   Medication stabilization   New Problems/Goals identified:    Discharge Plan or Barriers:   Pt plans to return home, follow up outpt.     Additional Comments:  32 year old male with a history of schizophrenia, alcohol and cocaine abuse who presents with a complaint of agitation and not being able to sleep. Paramedics were called out for suicidal ideation with possible threats to hurt himself. Though the patient states that he has a history of being dead and "killing myself," he is unable to answer questions about having an active plan to hurt himself. He has flights of ideas and tangential thought and states that he has been having racing thoughts for the last 3 days which has prevented him from sleeping. He states that he does not feel "grownup" and states that he wants to take care of his child but cannot when he is acting like a child. He does not answer questions about hallucinations. He does not take any psychiatric medications and last used cocaine yesterday   Attendees:  Signature: Thedore Mins, MD  07/16/2013 8:09 AM   Signature: Richelle Ito, LCSW  07/16/2013 8:09 AM    Signature: Fransisca Kaufmann, NP  07/16/2013 8:09 AM   Signature: Joslyn Devon, RN  07/16/2013 8:09 AM   Signature: Liborio Nixon, RN  07/16/2013 8:09 AM   Signature:  07/16/2013 8:09 AM   Signature:  07/16/2013 8:09 AM   Signature:    Signature:    Signature:    Signature:    Signature:    Signature:

## 2013-07-18 NOTE — Progress Notes (Signed)
Patient ID: Cole [name], male   DOB: 15-Aug-1981, 32 y.o.   MRN: [id number]  D:  Pt +ve AH but not command. Pt denies SI/HI/VH. Pt is pleasant and cooperative. Pt states he feels a little stressed out , tense, and shaky.   A: Pt was offered support and encouragement. Pt was given scheduled medications. Pt was encourage to attend groups. Q 15 minute checks were done for safety.   R:Pt attends groups and interacts well with peers and staff. Pt is taking medication.Pt receptive to treatment and safety maintained on unit.

## 2013-07-18 NOTE — Progress Notes (Signed)
Adult Psychoeducational Group Note  Date:  07/18/2013 Time:  11:00Am Group Topic/Focus:  personal develoopment  Participation Level:  Did Not Attend   Additional Comments: PT. Didn't attend group.   Bing Plume D 07/18/2013, 4:18 PM

## 2013-07-18 NOTE — Progress Notes (Signed)
Patient ID: Cole [name], male   DOB: June 19, 1981, 32 y.o.   MRN: [id number] D: Pt. Lying on chair in day room, no interaction noted. Pt. Reports depression at "4-5" of 10. Pt. Reports being here helps "being calm" A: Writer introduced self to client and provides emotional support. Writer encouraged group and reviewed med administration times. Staff will monitor q46min for safety. R: Pt. Is safe on the unit and attended group.

## 2013-07-18 NOTE — [facility] Suicide Risk Assessment (Signed)
Suicide Risk Assessment  Admission Assessment     Nursing information obtained from:  Patient Demographic factors:  Male;Low socioeconomic status;Unemployed Current Mental Status:  NA Loss Factors:  Legal issues;Financial problems / change in socioeconomic status Historical Factors:  Prior suicide attempts Risk Reduction Factors:  Living with another person, especially a relative  CLINICAL FACTORS:   Depression:   Aggression Anhedonia Comorbid alcohol abuse/dependence Delusional Hopelessness Impulsivity Insomnia Alcohol/Substance Abuse/Dependencies Schizophrenia:   Paranoid or undifferentiated type Currently Psychotic Previous Psychiatric Diagnoses and Treatments  COGNITIVE FEATURES THAT CONTRIBUTE TO RISK:  Closed-mindedness Polarized thinking    SUICIDE RISK:   Mild:  Suicidal ideation of limited frequency, intensity, duration, and specificity.  There are no identifiable plans, no associated intent, mild dysphoria and related symptoms, good self-control (both objective and subjective assessment), few other risk factors, and identifiable protective factors, including available and accessible social support.  PLAN OF CARE:1. Admit for crisis management and stabilization. 2. Medication management to reduce current symptoms to base line and improve the     patient's overall level of functioning 3. Treat health problems as indicated. 4. Develop treatment plan to decrease risk of relapse upon discharge and the need for     readmission. 5. Psycho-social education regarding relapse prevention and self care. 6. Health care follow up as needed for medical problems. 7. Restart home medications where appropriate.   I certify that inpatient services furnished can reasonably be expected to improve the patient's condition.  Thedore Mins, MD 07/18/2013, 10:44 AM

## 2013-07-19 DIAGNOSIS — F323 Major depressive disorder, single episode, severe with psychotic features: Secondary | ICD-10-CM

## 2013-07-19 DIAGNOSIS — F1110 Opioid abuse, uncomplicated: Secondary | ICD-10-CM

## 2013-07-19 DIAGNOSIS — F23 Brief psychotic disorder: Secondary | ICD-10-CM

## 2013-07-19 NOTE — [facility] Suicide Risk Assessment (Signed)
[facility] INPATIENT:  Family/Significant Other Suicide Prevention Education  Suicide Prevention Education:  Education Completed; Mother, Tra Wilemon, [phone]  has been identified by the patient as the family member/significant other with whom the patient will be residing, and identified as the person(s) who will aid the patient in the event of a mental health crisis (suicidal ideations/suicide attempt).  With written consent from the patient, the family member/significant other has been provided the following suicide prevention education, prior to the and/or following the discharge of the patient.  The suicide prevention education provided includes the following:  Suicide risk factors  Suicide prevention and interventions  National Suicide Hotline telephone number  [facility] assessment telephone number  [facility] Emergency Assistance 911  [facility] and/or Residential Mobile Crisis Unit telephone number  Request made of family/significant other to:  Remove weapons (e.g., guns, rifles, knives), all items previously/currently identified as safety concern.    Remove drugs/medications (over-the-counter, prescriptions, illicit drugs), all items previously/currently identified as a safety concern.  The family member/significant other verbalizes understanding of the suicide prevention education information provided.  The family member/significant other agrees to remove the items of safety concern listed above.  Simona Huh 07/19/2013, 10:57 AM

## 2013-07-19 NOTE — Progress Notes (Signed)
Recreation Therapy Notes  Date: 11.19.2014 Time: 9:30am Location: [address] Dayroom  Group Topic: Communication  Goal Area(s) Addresses:  Patient will effectively communicate with peers in group.  Patient will verbalize benefit of healthy communication. Patient will identify communication techniques that made activity effective for group.   Behavioral Response: Appropriate   Intervention: Game  Activity: Random Words. Patients were asked to select words from provided container, using 5 words or less patient was asked to describe selected word for group members to guess.    Education: Special educational needs teacher, Building control surveyor.    Education Outcome: Acknowledges understanding   Clinical Observations/Feedback: Patient actively engaged in activity, accurately describing words for peers to guess. Patient made no contributions to group discussion, but appeared to actively listen as he maintained appropriate eye contact with speaker.    Marykay Lex Marion Rosenberry, LRT/CTRS  Anyjah Roundtree L 07/19/2013 9:29 AM

## 2013-07-19 NOTE — Progress Notes (Signed)
Patient ID: Cole [name], male   DOB: Oct 15, 1980, 32 y.o.   MRN: [id number] D: pt. Noted in dayroom sleeping. Pt. Reports day been going well. "feel safe:" A: Writer encouraged client to attend karaoke. Staff will monitor q22min for safety. R: Pt. Is safe on the unit. Pt.attended karaoke and participated.

## 2013-07-19 NOTE — Progress Notes (Addendum)
D:  Patient's self inventory sheet, patient has fair sleep, poor appetite, low energy level, poor attention span.  Rated depression, hopeless, anxiety 5.  Denied withdrawals.  SI, contracts for safety.   A:  Medications administered per MD orders.  Emotional support and encouragement given patient. R:  Denied HI.  SI, contracts for safety.  Denied A/V hallucinations.  Will continue to monitor patient for safety with 15 minute checks.  Safety maintained.  After group, patient was laying in floor with blanket and sheet beside his bed.  Patient was given tylenol for headache and gatorade.  Patient denied SI and HI.  Contracts for safety.   Denied A/V hallucinations.   After medication, patient went back to room, laying on floor beside bed sleeping.   Respirations even and unlabored.  No signs/symptoms of pain/distress noted on patient's face or body movements.  Will continue 15 minute checks for safety.  Safety maintained.

## 2013-07-19 NOTE — Progress Notes (Signed)
[facility] MD Progress Note  07/19/2013 10:59 AM Cole [name]  MRN:  [id number] Subjective: "I am still feeling depressed and paranoid." Objective: Patient continues to verbalize auditory hallucinations, paranoia, depression and anxiety. He reports feeling hopeless, worthless, helpless, easily tired and lacks motivation. He endorsed suicidal thoughts but has no specific plan. Patient is compliant with his current medications and has not endorsed any adverse reactions. 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   HENT: Negative.   Eyes: Negative.   Respiratory: Negative.   Cardiovascular: Negative.   Gastrointestinal: Negative.   Genitourinary: Negative.   Musculoskeletal: Negative.   Skin: Negative.   Neurological: Negative.   Endo/Heme/Allergies: Negative.   Psychiatric/Behavioral: Positive for depression, suicidal ideas, hallucinations and substance abuse. The patient is nervous/anxious and has insomnia.     Blood pressure 125/57, pulse 86, temperature 97.3 F (36.3 C), temperature source Oral, resp. rate 20, height 5\' 9"  (1.753 m), weight 90.266 kg (199 lb).Body mass index is 29.37 kg/(m^2).  General Appearance: Disheveled  Eye Contact::  Minimal  Speech:  Pressured and Slow  Volume:  Decreased  Mood:   Depressed, Dysphoric and Hopeless  Affect:  Blunt  Thought Process:  Circumstantial  Orientation:  Full (Time, Place, and Person)  Thought Content:  Delusions and Hallucinations: Auditory  Suicidal Thoughts:  Yes.  without intent/plan  Homicidal Thoughts:  No  Memory:  Immediate;   Fair Recent;   Fair Remote;   Fair  Judgement:  Poor  Insight:  Lacking  Psychomotor Activity:  Decreased  Concentration:  Fair  Recall:  Fair  Akathisia:  No  Handed:  Right  AIMS (if indicated):     Assets:  Communication Skills Desire for Improvement Physical Health  Sleep:  Number of Hours: 6.5   Current Medications: Current Facility-Administered Medications  Medication Dose Route Frequency Provider Last Rate Last Dose  . acetaminophen (TYLENOL) tablet 650 mg  650 mg Oral Q4H PRN Court Joy, PA-C      . alum & mag hydroxide-simeth (MAALOX/MYLANTA) 200-200-20 MG/5ML suspension 30 mL  30 mL Oral PRN Court Joy, PA-C      . benztropine (COGENTIN) tablet 1 mg  1 mg Oral BID Avyana Puffenbarger   1 mg at 07/19/13 1191  . carbamazepine (TEGRETOL XR) 12 hr tablet 200 mg  200 mg Oral BID Cleone Hulick   200 mg at 07/19/13 0838  . haloperidol (HALDOL) tablet 5 mg  5 mg Oral BID Mayelin Panos   5 mg at 07/19/13 0838  . hydrOXYzine (ATARAX/VISTARIL) tablet 50 mg  50 mg Oral TID PRN Tomoko Sandra   50 mg at 07/18/13 2233  . ibuprofen (ADVIL,MOTRIN) tablet 600 mg  600 mg Oral Q8H PRN Court Joy, PA-C   600 mg at 07/18/13 2230  .  magnesium hydroxide (MILK OF MAGNESIA) suspension 30 mL  30 mL Oral Daily PRN Court Joy, PA-C      . nicotine (NICODERM CQ - dosed in mg/24 hours) patch 21 mg  21 mg Transdermal Daily Court Joy, PA-C   21 mg at 07/19/13 0836  . OLANZapine zydis (ZYPREXA) disintegrating tablet 10 mg  10 mg Oral Q8H PRN Kastiel Simonian      . ondansetron (ZOFRAN) tablet 4 mg  4 mg Oral Q8H PRN Court Joy, PA-C      . traZODone (DESYREL) tablet 50 mg  50 mg Oral QHS PRN  Pang Robers   50 mg at 07/18/13 2231    Lab Results: No results found for this or any previous visit (from the past 48 hour(s)).  Physical Findings: AIMS: Facial and Oral Movements Muscles of Facial Expression: None, normal Lips and Perioral Area: None, normal Jaw: None, normal Tongue: None, normal,Extremity Movements Upper (arms, wrists, hands, fingers): None, normal Lower (legs, knees, ankles, toes): None, normal, Trunk Movements Neck, shoulders, hips: None, normal, Overall Severity Severity of abnormal movements (highest score from questions above): None, normal Incapacitation due to abnormal movements: None, normal Patient's awareness of abnormal movements (rate only patient's report): No Awareness, Dental Status Current problems with teeth and/or dentures?: No Does patient usually wear dentures?: No  CIWA:  CIWA-Ar Total: 2 COWS:  COWS Total Score: 3  Treatment Plan Summary: Daily contact with patient to assess and evaluate symptoms and progress in treatment Medication management  Plan:1. Admit for crisis management and stabilization. 2. Medication management to reduce current symptoms to base line and improve the     patient's overall level of functioning 3. Treat health problems as indicated. 4. Develop treatment plan to decrease risk of relapse upon discharge and the need for     readmission. 5. Psycho-social education regarding relapse prevention and self care. 6. Health care follow up as needed for medical problems. 7. Restart home medications where appropriate.   Medical Decision Making Problem Points:  Established problem, worsening (2), Review of last therapy session (1) and Review of psycho-social stressors (1) Data Points:  Order Aims Assessment (2) Review of medication regiment & side effects (2) Review of new medications or change in dosage (2)  I certify that inpatient services furnished can reasonably be expected to improve the patient's condition.    Thedore Mins, MD 07/19/2013, 10:59 AM

## 2013-07-19 NOTE — Consult Note (Signed)
Agree with plan 

## 2013-07-19 NOTE — [facility] Group Notes (Addendum)
[facility] Group Notes:  (Counselor/Nursing/MHT/Case Management/Adjunct)  07/19/2013 1:15PM  Type of Therapy:  Group Therapy  Participation Level:  Did not attend  Could not find him before or after group.  Did not respond to me calling his name in his room.  With the help of the MHT, found him lying on the floor on the other side of the bed with his face covered.  Would not respond to my inquiries.   Summary of Progress/Problems: The topic for group was balance in life.  Pt participated in the discussion about when their life was in balance and out of balance and how this feels.  Pt discussed ways to get back in balance and short term goals they can work on to get where they want to be.    Cole [name] 07/19/2013 1:29 PM

## 2013-07-20 DIAGNOSIS — F329 Major depressive disorder, single episode, unspecified: Secondary | ICD-10-CM

## 2013-07-20 DIAGNOSIS — F29 Unspecified psychosis not due to a substance or known physiological condition: Secondary | ICD-10-CM

## 2013-07-20 LAB — BASIC METABOLIC PANEL
BUN: 13 mg/dL (ref 6–23)
CO2: 24 mEq/L (ref 19–32)
Calcium: 8.8 mg/dL (ref 8.4–10.5)
Chloride: 105 mEq/L (ref 96–112)
Creatinine, Ser: 1.26 mg/dL (ref 0.50–1.35)

## 2013-07-20 NOTE — Progress Notes (Signed)
Recreation Therapy Notes  Date: 11.21.2014 Time: 9:30am Location: [address] Dayroom   Group Topic: Emotional Recognition, Coping Skills  Goal Area(s) Addresses:  Patient will effectively express themselves through art. Patient will identify a benefit of using art as a coping mechanism.   Behavioral Response: Appropriate, Drowsy  Intervention: Art  Activity: Patient was given a choice of 5 words - security, stability, happiness, laughter, and health. Patient was asked to select the word that most appealed to them. Patient was given color pencils and crayons to color the word as the saw fit.   Education: Clinical biochemist, Building control surveyor.   Education Outcome: Acknowledges understanding   Clinical Observations/Feedback: Patient selected happiness, but chose not to disclose why he chose this word. Patient did state that he chose the color green because green represents money and money represents happiness.  Patient appeared to dose off during session, when asked if he was sleepy patient denied being tired.   Marykay Lex Sonika Levins, LRT/CTRS  Jearl Klinefelter 07/20/2013 3:16 PM

## 2013-07-20 NOTE — Progress Notes (Signed)
Patient ID: Cole [name], male   DOB: 1981-02-16, 32 y.o.   MRN: [id number] [facility] MD Progress Note  07/20/2013 12:11 PM Cole [name]  MRN:  [id number] Subjective: Patient states "I'm ready to go home. I feel paranoid every day. I don't know how to describe it. I just lied about using heroine. I don't know why."   Objective: Patient continues to verbalize auditory hallucinations, paranoia, depression and anxiety. He reports feeling hopeless, worthless, helpless, easily tired and lacks motivation. He endorsed suicidal thoughts but has no specific plan. Patient observed resting in bed making poor eye contact with Clinical research associate. Nursing staff report that he has been complaining of stomach pain informing them that he was not taking his prescribed percocet. Patient complained of chronic back pain to this writer and does not appear to be consistent with his complaints.   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   HENT: Negative.   Eyes: Negative.   Respiratory: Negative.   Cardiovascular: Negative.   Gastrointestinal: Negative.   Genitourinary: Negative.   Musculoskeletal: Negative.   Skin: Negative.   Neurological: Negative.   Endo/Heme/Allergies: Negative.   Psychiatric/Behavioral: Positive for depression, suicidal  ideas, hallucinations and substance abuse. Negative for memory loss. The patient is nervous/anxious and has insomnia.     Blood pressure 122/89, pulse 98, temperature 97.9 F (36.6 C), temperature source Oral, resp. rate 16, height 5\' 9"  (1.753 m), weight 90.266 kg (199 lb).Body mass index is 29.37 kg/(m^2).  General Appearance: Disheveled  Eye Contact::  Minimal  Speech:  Pressured and Slow  Volume:  Decreased  Mood:  Depressed, Dysphoric and Hopeless  Affect:  Blunt  Thought Process:  Circumstantial  Orientation:  Full (Time, Place, and Person)  Thought Content:  Delusions and Hallucinations: Auditory  Suicidal Thoughts:  Yes.  without intent/plan  Homicidal Thoughts:  No  Memory:  Immediate;   Fair Recent;   Fair Remote;   Fair  Judgement:  Poor  Insight:  Lacking  Psychomotor Activity:  Decreased  Concentration:  Fair  Recall:  Fair  Akathisia:  No  Handed:  Right  AIMS (if indicated):     Assets:  Communication Skills Desire for Improvement Physical Health  Sleep:  Number of Hours: 6.5   Current Medications: Current Facility-Administered Medications  Medication Dose Route Frequency Provider Last Rate Last Dose  . acetaminophen (TYLENOL) tablet 650 mg  650 mg Oral Q4H PRN Court Joy, PA-C   650 mg at 07/19/13 1457  . alum & mag hydroxide-simeth (MAALOX/MYLANTA) 200-200-20 MG/5ML suspension 30 mL  30 mL Oral PRN Court Joy, PA-C      . benztropine (COGENTIN) tablet 1 mg  1 mg Oral BID Mojeed Akintayo   1 mg at 07/20/13 0802  . carbamazepine (TEGRETOL XR)  12 hr tablet 200 mg  200 mg Oral BID Mojeed Akintayo   200 mg at 07/20/13 0802  . haloperidol (HALDOL) tablet 5 mg  5 mg Oral BID Mojeed Akintayo   5 mg at 07/20/13 0802  . hydrOXYzine (ATARAX/VISTARIL) tablet 50 mg  50 mg Oral TID PRN Mojeed Akintayo   50 mg at 07/20/13 0924  . ibuprofen (ADVIL,MOTRIN) tablet 600 mg  600 mg Oral Q8H PRN Court Joy, PA-C   600 mg at 07/19/13 2152  . magnesium hydroxide (MILK  OF MAGNESIA) suspension 30 mL  30 mL Oral Daily PRN Court Joy, PA-C      . nicotine (NICODERM CQ - dosed in mg/24 hours) patch 21 mg  21 mg Transdermal Daily Court Joy, PA-C   21 mg at 07/20/13 0804  . OLANZapine zydis (ZYPREXA) disintegrating tablet 10 mg  10 mg Oral Q8H PRN Mojeed Akintayo   10 mg at 07/20/13 0924  . ondansetron (ZOFRAN) tablet 4 mg  4 mg Oral Q8H PRN Court Joy, PA-C   4 mg at 07/20/13 9604  . traZODone (DESYREL) tablet 50 mg  50 mg Oral QHS PRN Mojeed Akintayo   50 mg at 07/19/13 2149    Lab Results: No results found for this or any previous visit (from the past 48 hour(s)).  Physical Findings: AIMS: Facial and Oral Movements Muscles of Facial Expression: None, normal Lips and Perioral Area: None, normal Jaw: None, normal Tongue: None, normal,Extremity Movements Upper (arms, wrists, hands, fingers): None, normal Lower (legs, knees, ankles, toes): None, normal, Trunk Movements Neck, shoulders, hips: None, normal, Overall Severity Severity of abnormal movements (highest score from questions above): None, normal Incapacitation due to abnormal movements: None, normal Patient's awareness of abnormal movements (rate only patient's report): No Awareness, Dental Status Current problems with teeth and/or dentures?: No Does patient usually wear dentures?: No  CIWA:  CIWA-Ar Total: 0 COWS:  COWS Total Score: 0  Treatment Plan Summary: Daily contact with patient to assess and evaluate symptoms and progress in treatment Medication management  Plan:1. Continue crisis management and stabilization. 2. Medication management to reduce current symptoms to base line and improve the  patient's overall level of functioning 3. Treat health problems as indicated. 4. Develop treatment plan to decrease risk of relapse upon discharge and the need for readmission. 5. Psycho-social education regarding relapse prevention and self care. 6. Health care follow up as needed for  medical problems. Recheck basic metabolic panel to follow up on previous low potassium level.  7. Restart home medications where appropriate.  Medical Decision Making Problem Points:  Established problem, worsening (2), Review of last therapy session (1) and Review of psycho-social stressors (1) Data Points:  Order Aims Assessment (2) Review of medication regiment & side effects (2) Review of new medications or change in dosage (2)  I certify that inpatient services furnished can reasonably be expected to improve the patient's condition.   Fransisca Kaufmann, NP-C 07/20/2013, 12:11 PM

## 2013-07-20 NOTE — Progress Notes (Signed)
Patient observed in dayroom. Napping, drowsy. Patient present during evening wrap up group.Patient reports "Day was fine". Patient colored and got rest. Patient has not developed plans for post discharge. Patient is calm, cooperative. Patient went to bed after group. Resting quietly.  Patient remains safe, Q 15 minute checks continue.
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
## 2013-07-20 NOTE — Progress Notes (Signed)
Adult Psychoeducational Group Note  Date:  07/20/2013 Time:  11:35 PM  Group Topic/Focus:  Wrap-Up Group:   The focus of this group is to help patients review their daily goal of treatment and discuss progress on daily workbooks.  Participation Level:  Minimal  Participation Quality:  Appropriate  Affect:  Flat  Cognitive:  Appropriate  Insight: Appropriate  Engagement in Group:  Defensive and Engaged  Modes of Intervention:  Rapport Building  Additional Comments:  Pt attended evening group with limited amount of insight  Cole [name] A 07/20/2013, 11:35 PM

## 2013-07-20 NOTE — [facility] Group Notes (Signed)
[facility] LCSW Aftercare Discharge Planning Group Note   07/20/2013 10:27 AM  Participation Quality:  Minimal  Mood/Affect:  Flat and Irritable  Depression Rating:    Anxiety Rating:    Thoughts of Suicide:  No Will you contract for safety?   NA  Current AVH:  Yes  Plan for Discharge/Comments:  Jill Alexanders staggered down to group and plopped in a chair.  He interrupted the current speaker to announce that he his stomach is upset.  When asked about current meds and side effects, announced that they seem to be working now, but will not work outside of the hospital setting "because they never do."  Said something about rehab, but did not wait to hear the answer as he staggered back out of the room.  Transportation Means:   Supports:  Daryel Gerald B

## 2013-07-20 NOTE — [facility] Group Notes (Signed)
[facility] LCSW Group Therapy  07/20/2013  1:05 PM  Type of Therapy:  Group therapy  Participation Level:  Did not attend   Summary of Progress/Problems:  Chaplain was here to lead a group on themes of hope and courage.  Daryel Gerald B 07/20/2013 1:56 PM

## 2013-07-21 DIAGNOSIS — F3289 Other specified depressive episodes: Secondary | ICD-10-CM

## 2013-07-21 DIAGNOSIS — F329 Major depressive disorder, single episode, unspecified: Secondary | ICD-10-CM

## 2013-07-21 DIAGNOSIS — F1920 Other psychoactive substance dependence, uncomplicated: Secondary | ICD-10-CM

## 2013-07-21 NOTE — Progress Notes (Signed)
Patient ID: Cole [name], male   DOB: May 28, 1981, 32 y.o.   MRN: [id number] [facility] MD Progress Note  07/21/2013 12:40 PM LOGYN KENDRICK  MRN:  [id number] Subjective: Guarded and still feels paranoia. Looking around with minimum eye contact. Objective: Endorses paranoia. Remains quite as if responding to internal stimuli. Endorses hallucinations but not commanding. Flat affect. 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   HENT: Negative.   Eyes: Negative.   Respiratory: Negative.   Cardiovascular: Negative.   Gastrointestinal: Negative.   Genitourinary: Negative.   Musculoskeletal: Negative.   Skin: Negative.   Neurological: Negative.   Endo/Heme/Allergies: Negative.   Psychiatric/Behavioral: Positive for depression, suicidal ideas, hallucinations and substance abuse. The patient is nervous/anxious and has insomnia.     Blood pressure 127/80, pulse 82, temperature 97.3 F (36.3 C), temperature source Oral, resp. rate 16, height 5\' 9"  (1.753 m), weight 90.266 kg (199 lb).Body mass index is 29.37 kg/(m^2).  General Appearance: Disheveled  Eye Contact::  Minimal  Speech:  Pressured and Slow  Volume:  Decreased  Mood:  Depressed, Dysphoric and Hopeless  Affect:  Blunt  Thought Process:  Circumstantial   Orientation:  Full (Time, Place, and Person)  Thought Content:  Delusions and Hallucinations: Auditory  Suicidal Thoughts:  Yes.  without intent/plan  Homicidal Thoughts:  No  Memory:  Immediate;   Fair Recent;   Fair Remote;   Fair  Judgement:  Poor  Insight:  Lacking  Psychomotor Activity:  Decreased  Concentration:  Fair  Recall:  Fair  Akathisia:  No  Handed:  Right  AIMS (if indicated):     Assets:  Communication Skills Desire for Improvement Physical Health  Sleep:  Number of Hours: 6.75   Current Medications: Current Facility-Administered Medications  Medication Dose Route Frequency Provider Last Rate Last Dose  . acetaminophen (TYLENOL) tablet 650 mg  650 mg Oral Q4H PRN Court Joy, PA-C   650 mg at 07/19/13 1457  . alum & mag hydroxide-simeth (MAALOX/MYLANTA) 200-200-20 MG/5ML suspension 30 mL  30 mL Oral PRN Court Joy, PA-C      . benztropine (COGENTIN) tablet 1 mg  1 mg Oral BID Mojeed Akintayo   1 mg at 07/21/13 0823  . carbamazepine (TEGRETOL XR) 12 hr tablet 200 mg  200 mg Oral BID Mojeed Akintayo   200 mg at 07/21/13 0824  . haloperidol (HALDOL) tablet 5 mg  5 mg Oral BID Mojeed Akintayo   5 mg at 07/21/13 0824  . hydrOXYzine (ATARAX/VISTARIL) tablet 50 mg  50 mg Oral TID PRN Mojeed Akintayo   50 mg at 07/20/13 0924  . ibuprofen (ADVIL,MOTRIN) tablet 600 mg  600 mg Oral Q8H PRN Court Joy, PA-C   600 mg at 07/19/13 2152  . magnesium hydroxide (  MILK OF MAGNESIA) suspension 30 mL  30 mL Oral Daily PRN Court Joy, PA-C      . nicotine (NICODERM CQ - dosed in mg/24 hours) patch 21 mg  21 mg Transdermal Daily Court Joy, PA-C   21 mg at 07/21/13 0824  . OLANZapine zydis (ZYPREXA) disintegrating tablet 10 mg  10 mg Oral Q8H PRN Mojeed Akintayo   10 mg at 07/20/13 0924  . ondansetron (ZOFRAN) tablet 4 mg  4 mg Oral Q8H PRN Court Joy, PA-C   4 mg at 07/20/13 8295  . traZODone (DESYREL) tablet 50 mg  50 mg Oral QHS PRN Mojeed Akintayo   50 mg  at 07/19/13 2149    Lab Results:  Results for orders placed during the hospital encounter of 07/17/13 (from the past 48 hour(s))  BASIC METABOLIC PANEL     Status: Abnormal   Collection Time    07/20/13  8:21 PM      Result Value Range   Sodium 138  135 - 145 mEq/L   Potassium 4.2  3.5 - 5.1 mEq/L   Chloride 105  96 - 112 mEq/L   CO2 24  19 - 32 mEq/L   Glucose, Bld 106 (*) 70 - 99 mg/dL   BUN 13  6 - 23 mg/dL   Creatinine, Ser 6.21  0.50 - 1.35 mg/dL   Calcium 8.8  8.4 - 30.8 mg/dL   GFR calc non Af Amer 75 (*) >90 mL/min   GFR calc Af Amer 87 (*) >90 mL/min   Comment: (NOTE)     The eGFR has been calculated using the CKD EPI equation.     This calculation has not been validated in all clinical situations.     eGFR's persistently <90 mL/min signify possible Chronic Kidney     Disease.     Performed at [facility]    Physical Findings: AIMS: Facial and Oral Movements Muscles of Facial Expression: None, normal Lips and Perioral Area: None, normal Jaw: None, normal Tongue: None, normal,Extremity Movements Upper (arms, wrists, hands, fingers): None, normal Lower (legs, knees, ankles, toes): None, normal, Trunk Movements Neck, shoulders, hips: None, normal, Overall Severity Severity of abnormal movements (highest score from questions above): None, normal Incapacitation due to abnormal movements: None, normal Patient's awareness of abnormal movements (rate only patient's report): No Awareness, Dental Status Current problems with teeth and/or dentures?: No Does patient usually wear dentures?: No  CIWA:  CIWA-Ar Total: 0 COWS:  COWS Total Score: 0  Treatment Plan Summary: Daily contact with patient to assess and evaluate symptoms and progress in treatment Medication management  Plan:1. Admit for crisis management and stabilization. 2. Medication management to reduce current symptoms to base line and improve the     patient's overall level of  functioning 3. Treat health problems as indicated. 4. Develop treatment plan to decrease risk of relapse upon discharge and the need for     readmission. 5. Psycho-social education regarding relapse prevention and self care. 6. Health care follow up as needed for medical problems. 7. Restart home medications where appropriate.   Medical Decision Making Problem Points:  Established problem, worsening (2), Review of last therapy session (1) and Review of psycho-social stressors (1) Data Points:  Order Aims Assessment (2) Review of medication regiment & side effects (2) Review of new medications or change in dosage (2)  I certify that inpatient services furnished can reasonably be expected to improve the patient's condition.   Json Koelzer,  Monetta Lick, MD 07/21/2013, 12:40 PM

## 2013-07-21 NOTE — [facility] Group Notes (Signed)
[facility] Group Notes:  (Clinical Social Work)  07/21/2013  11:15-11:45AM  Summary of Progress/Problems:   The main focus of today's process group was for the patient to identify ways in which they have in the past sabotaged their own recovery and reasons they may have done this/what they received from doing it.  We then worked to identify a specific plan to avoid doing this when discharged from the hospital for this admission.  The patient expressed that he argues with "anybody about anything."  He then closed his eyes and did not participate in the remaining discussion except to ask about how he can get discharged from the hospital.  Type of Therapy:  Group Therapy - Process  Participation Level:  Minimal  Participation Quality:  Drowsy and Inattentive  Affect:  Depressed and Flat  Cognitive:  UNKNOWN  Insight:  Limited  Engagement in Therapy:  Limited  Modes of Intervention:  Clarification, Education, Exploration, Discussion  Ambrose Mantle, LCSW 07/21/2013, 1:15 PM

## 2013-07-21 NOTE — Progress Notes (Signed)
Patient ID: Cole [name], male   DOB: 1980/11/10, 32 y.o.   MRN: [id number] D. Patient presents with depressed mood, affect blunted . Patient states '' so you're sure that the doctor isn't going to discharge me this weekend? '' Patient denies any acute concerns at this time. However patient very minimal with interaction and not forthcoming to staff. He denies any SI/ HI  At this time. A. Support and encouragement provided. Medications given as ordered. Discussed above with MD. R. Patient remains cooperative at this time. Will continue to monitor q 15 minutes for safety.

## 2013-07-21 NOTE — Progress Notes (Signed)
Patient ID: Cole [name], male   DOB: 1981/06/12, 32 y.o.   MRN: [id number] Psychoeducational Group Note  Date:  07/21/2013 Time:0930am  Group Topic/Focus:  Identifying Needs:   The focus of this group is to help patients identify their personal needs that have been historically problematic and identify healthy behaviors to address their needs.  Participation Level:  Active  Participation Quality:  Appropriate  Affect:  Lethargic  Cognitive:  Appropriate  Insight:  Limited  Engagement in Group:  Limited  Additional Comments:  Inventory and Psychoeducational group   Valente David 07/21/2013,10:04 AM

## 2013-07-22 NOTE — Progress Notes (Signed)
Patient ID: Cole [name], male   DOB: Jul 07, 1981, 32 y.o.   MRN: [id number] Psychoeducational Group Note  Date:  07/22/2013 Time:  0930am  Group Topic/Focus:  Making Healthy Choices:   The focus of this group is to help patients identify negative/unhealthy choices they were using prior to admission and identify positive/healthier coping strategies to replace them upon discharge.  Participation Level:  Active  Participation Quality:  Appropriate  Affect:  Lethargic  Cognitive:  Appropriate  Insight:  Supportive  Engagement in Group:  Supportive  Additional Comments:  Inventory and Psychoeducational group   Valente David 07/22/2013,10:42 AM

## 2013-07-22 NOTE — Progress Notes (Signed)
Adult Psychoeducational Group Note  Date:  07/22/2013 Time:  8:00 pm  Group Topic/Focus:  Wrap-Up Group:   The focus of this group is to help patients review their daily goal of treatment and discuss progress on daily workbooks.  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 Pt stated he is a happy person.   Laural Benes, Maleek Craver 07/22/2013, 11:07 PM

## 2013-07-22 NOTE — [facility] Group Notes (Signed)
[facility] Group Notes:  (Clinical Social Work)  07/22/2013   11:15am-12:00pm  Summary of Progress/Problems:  The main focus of today's process group was to listen to a variety of genres of music and to identify that different types of music provoke different responses.  The patient then was able to identify personally what was soothing for them, as well as energizing.  Handouts were used to record feelings evoked, as well as how patient can personally use this knowledge in sleep habits, with depression, and with other symptoms.  The patient expressed understanding of concepts, as well as knowledge of how each type of music affected them and how this can be used when they are at home as a tool in their recovery.  He kept his eyes closed and only responded when called on directly, but he did respond appropriately at those times.  Type of Therapy:  Music Therapy   Participation Level:  Active  Participation Quality:  Attentive and Sharing  Affect:  Blunted, Drowsy  Cognitive:  Oriented  Insight:  Engaged  Engagement in Therapy:  Engaged  Modes of Intervention:   Activity, Exploration  Ambrose Mantle, LCSW 07/22/2013, 12:30pm

## 2013-07-22 NOTE — Progress Notes (Signed)
Pt approached this writer to ask if he had any meds scheduled this evening. However at the initiation of the conversation pt dropped his head and stared at the floor. He continued to stare at the floor as we spoke. When asked how his day was he asked suspiciously, "why are you asking me that?" Reassured pt this writer asks all patients about their days and their general well being. Pt accepting. Then asked pt to look up at which time he did make eye contact however then ended the conversation. He did deny any SI/HI/AVH and is safe on the unit. Lawrence Marseilles

## 2013-07-22 NOTE — Progress Notes (Signed)
Brief 1:1 with this Clinical research associate. Pt continues to forward little information, minimal interaction. Continues to maintain he's "doing fine" however also remains focused on discharge. When asked about hallucinations he denies though endorsed to MD earlier. Judgement remains impaired. Pt supported, encouraged. He did attend group. No prn's required tonight. Denies SI/HI and remains safe. Cole [name]

## 2013-07-22 NOTE — Progress Notes (Signed)
Patient ID: Cole [name], male   DOB: 1981/06/24, 32 y.o.   MRN: [id number] [facility] MD Progress Note  07/22/2013 12:36 PM JAILYN LANGHORST  MRN:  [id number] Subjective: Guarded with poor eye contact. Objective: Endorses paranoia. Remains quite but irrational when he talks.  Endorses hallucinations but not commanding. Flat affect. 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   HENT: Negative.   Eyes: Negative.   Respiratory: Negative.   Cardiovascular: Negative.   Gastrointestinal: Negative.   Genitourinary: Negative.   Musculoskeletal: Negative.   Skin: Negative.   Neurological: Negative.   Endo/Heme/Allergies: Negative.   Psychiatric/Behavioral: Positive for depression, suicidal ideas, hallucinations and substance abuse. The patient is nervous/anxious and has insomnia.     Blood pressure 127/80, pulse 82, temperature 97.3 F (36.3 C), temperature source Oral, resp. rate 16, height 5\' 9"  (1.753 m), weight 90.266 kg (199 lb).Body mass index is 29.37 kg/(m^2).  General Appearance: Disheveled  Eye Contact::  Minimal  Speech:  Pressured and Slow  Volume:  Decreased  Mood:  Depressed, Dysphoric and Hopeless  Affect:  Blunt  Thought Process:  Circumstantial  Orientation:  Full (Time, Place, and Person)  Thought  Content:  Delusions and Hallucinations: Auditory  Suicidal Thoughts:  Yes.  without intent/plan  Homicidal Thoughts:  No  Memory:  Immediate;   Fair Recent;   Fair Remote;   Fair  Judgement:  Poor  Insight:  Lacking  Psychomotor Activity:  Decreased  Concentration:  Fair  Recall:  Fair  Akathisia:  No  Handed:  Right  AIMS (if indicated):     Assets:  Communication Skills Desire for Improvement Physical Health  Sleep:  Number of Hours: 6.25   Current Medications: Current Facility-Administered Medications  Medication Dose Route Frequency Provider Last Rate Last Dose  . acetaminophen (TYLENOL) tablet 650 mg  650 mg Oral Q4H PRN Court Joy, PA-C   650 mg at 07/19/13 1457  . alum & mag hydroxide-simeth (MAALOX/MYLANTA) 200-200-20 MG/5ML suspension 30 mL  30 mL Oral PRN Court Joy, PA-C      . benztropine (COGENTIN) tablet 1 mg  1 mg Oral BID Mojeed Akintayo   1 mg at 07/22/13 0823  . carbamazepine (TEGRETOL XR) 12 hr tablet 200 mg  200 mg Oral BID Mojeed Akintayo   200 mg at 07/22/13 0823  . haloperidol (HALDOL) tablet 5 mg  5 mg Oral BID Mojeed Akintayo   5 mg at 07/22/13 0823  . hydrOXYzine (ATARAX/VISTARIL) tablet 50 mg  50 mg Oral TID PRN Mojeed Akintayo   50 mg at 07/22/13 0246  . ibuprofen (ADVIL,MOTRIN) tablet 600 mg  600 mg Oral Q8H PRN Court Joy, PA-C   600 mg at 07/19/13 2152  . magnesium hydroxide (MILK OF MAGNESIA) suspension 30 mL  30 mL Oral Daily PRN Court Joy, PA-C      . nicotine (NICODERM CQ - dosed in mg/24 hours) patch 21 mg  21 mg Transdermal Daily Court Joy, PA-C   21 mg at 07/22/13 0850  . OLANZapine zydis (ZYPREXA) disintegrating tablet 10 mg  10 mg Oral Q8H PRN Mojeed Akintayo   10 mg at 07/20/13 0924  . ondansetron (ZOFRAN) tablet 4 mg  4 mg Oral Q8H PRN Court Joy, PA-C   4 mg at 07/20/13 4098  . traZODone (DESYREL) tablet 50 mg  50 mg Oral QHS PRN Mojeed Akintayo   50 mg at 07/19/13 2149    Lab Results:  Results for orders  placed during the hospital encounter of 07/17/13 (from the past 48 hour(s))  BASIC METABOLIC PANEL     Status: Abnormal   Collection Time    07/20/13  8:21 PM      Result Value Range   Sodium 138  135 - 145 mEq/L   Potassium 4.2  3.5 - 5.1 mEq/L   Chloride 105  96 - 112 mEq/L   CO2 24  19 - 32 mEq/L   Glucose, Bld 106 (*) 70 - 99 mg/dL   BUN 13  6 - 23 mg/dL   Creatinine, Ser 1.19  0.50 - 1.35 mg/dL   Calcium 8.8  8.4 - 14.7 mg/dL   GFR calc non Af Amer 75 (*) >90 mL/min   GFR calc Af Amer 87 (*) >90 mL/min   Comment: (NOTE)     The eGFR has been calculated using the CKD EPI equation.     This calculation has not been validated in all clinical situations.     eGFR's persistently <90 mL/min signify possible Chronic Kidney     Disease.     Performed at [facility]    Physical Findings: AIMS: Facial and Oral Movements Muscles of Facial Expression: None, normal Lips and Perioral Area: None, normal Jaw: None, normal Tongue: None, normal,Extremity Movements Upper (arms, wrists, hands, fingers): None, normal Lower (legs, knees, ankles, toes): None, normal, Trunk Movements Neck, shoulders, hips: None, normal, Overall Severity Severity of abnormal movements (highest score from questions above): None, normal Incapacitation due to abnormal movements: None, normal Patient's awareness of abnormal movements (rate only patient's report): No Awareness, Dental Status Current problems with teeth and/or dentures?: No Does patient usually wear dentures?: No  CIWA:  CIWA-Ar Total: 0 COWS:  COWS Total Score: 0  Treatment Plan Summary: Daily contact with patient to assess and evaluate symptoms and progress in treatment Medication management. Continue olanzapine, tegretol and benztropine.  Plan:1. Admit for crisis management and stabilization. 2. Medication management to reduce current symptoms to base line and improve the     patient's overall level of functioning 3. Treat  health problems as indicated. 4. Develop treatment plan to decrease risk of relapse upon discharge and the need for     readmission. 5. Psycho-social education regarding relapse prevention and self care. 6. Health care follow up as needed for medical problems. 7. Restart home medications where appropriate.   Medical Decision Making Problem Points:  Established problem, worsening (2), Review of last therapy session (1) and Review of psycho-social stressors (1) Data Points:  Order Aims Assessment (2) Review of medication regiment & side effects (2) Review of new medications or change in dosage (2)  I certify that inpatient services furnished can reasonably be expected to improve the patient's condition.   Thresa Ross, MD  07/22/2013, 12:36 PM

## 2013-07-22 NOTE — Progress Notes (Signed)
Patient ID: Cole [name], male   DOB: Dec 03, 1980, 32 y.o.   MRN: [id number] D. Patient presents with depressed mood, affect blunted . Patient states ''I'm doing fine.  I slept good, I'm ok right now. Just tired, still hoping I can be discharged tomorrow'' Patient remains guarded, he denies any acute concerns at this time. However patient very minimal with interaction and not forthcoming to staff. He denies any SI/ HI At this time. A. Support and encouragement provided. Medications given as ordered. Discussed above with MD. R. Patient remains cooperative at this time. Will continue to monitor q 15 minutes for safety.

## 2013-07-23 MED ORDER — CARBAMAZEPINE ER 200 MG PO TB12: 200.0000 mg | ORAL_TABLET | Freq: Two times a day (BID) | ORAL | Status: DC

## 2013-07-23 MED ORDER — HALOPERIDOL DECANOATE 100 MG/ML IM SOLN: 50.0000 mg | INTRAMUSCULAR | Status: DC

## 2013-07-23 MED ORDER — HALOPERIDOL DECANOATE 100 MG/ML IM SOLN
50.0000 mg | INTRAMUSCULAR | Status: DC
  Administered 2013-07-23: 50 mg via INTRAMUSCULAR
  Filled 2013-07-23: qty 0.5

## 2013-07-23 MED ORDER — IBUPROFEN 200 MG PO TABS: 800.0000 mg | ORAL_TABLET | Freq: Four times a day (QID) | ORAL | Status: DC | PRN

## 2013-07-23 MED ORDER — DIPHENHYDRAMINE HCL 50 MG/ML IJ SOLN
50.0000 mg | Freq: Once | INTRAMUSCULAR | Status: AC
  Administered 2013-07-23: 50 mg via INTRAMUSCULAR
  Filled 2013-07-23: qty 1

## 2013-07-23 MED ORDER — HALOPERIDOL 5 MG PO TABS: 5.0000 mg | ORAL_TABLET | Freq: Two times a day (BID) | ORAL | Status: DC

## 2013-07-23 MED ORDER — BENZTROPINE MESYLATE 1 MG PO TABS: 1.0000 mg | ORAL_TABLET | Freq: Two times a day (BID) | ORAL | Status: DC

## 2013-07-23 MED ORDER — TRAZODONE HCL 50 MG PO TABS: 50.0000 mg | ORAL_TABLET | Freq: Every evening | ORAL | Status: DC | PRN

## 2013-07-23 NOTE — Progress Notes (Signed)
Recreation Therapy Notes  Date: 11.24.2014 Time: 9:30am Location: [address] Dayroom  Group Topic: Wellness  Goal Area(s) Addresses:  Patient will define components of whole wellness. Patient will verbalize benefit of whole wellness.  Behavioral Response: Disengaged  Intervention: Group Mind Map  Activity: Patients were asked to create a group flow chart highlighting the dimensions of wellness, as well as things they can do to personally invest in wellness.    Education: Wellness, Personal Care  Education Outcome: Acknowledges understanding   Clinical Observations/Feedback: Patient attended group session, but did not engage in or contribute to group activity.  Marykay Lex Jeanee Fabre, LRT/CTRS  Paxton Binns L 07/23/2013 4:00 PM

## 2013-07-23 NOTE — Tx Team (Signed)
  Interdisciplinary Treatment Plan Update   Date Reviewed:  07/23/2013  Time Reviewed:  3:52 PM  Progress in Treatment:   Attending groups: Yes Participating in groups: Yes Taking medication as prescribed: Yes  Tolerating medication: Yes Family/Significant other contact made: Yes  Patient understands diagnosis: Yes  Discussing patient identified problems/goals with staff: Yes Medical problems stabilized or resolved: Yes Denies suicidal/homicidal ideation: Yes Patient has not harmed self or others: Yes  For review of initial/current patient goals, please see plan of care.  Estimated Length of Stay:  Likely d/c tomorrow  Reason for Continuation of Hospitalization:   New Problems/Goals identified:  N/A  Discharge Plan or Barriers:   return home, follow up outpt  Additional Comments:  Attendees:  Signature: Thedore Mins, MD 07/23/2013 3:52 PM   Signature: Richelle Ito, LCSW 07/23/2013 3:52 PM  Signature: Fransisca Kaufmann, NP 07/23/2013 3:52 PM  Signature: Joslyn Devon, RN 07/23/2013 3:52 PM  Signature: Liborio Nixon, RN 07/23/2013 3:52 PM  Signature:  07/23/2013 3:52 PM  Signature:   07/23/2013 3:52 PM  Signature:    Signature:    Signature:    Signature:    Signature:    Signature:      Scribe for Treatment Team:   Richelle Ito, LCSW  07/23/2013 3:52 PM

## 2013-07-23 NOTE — Progress Notes (Signed)
Patient ID: Cole [name], male   DOB: May 09, 1981, 32 y.o.   MRN: [id number] [facility] MD Progress Note  07/23/2013 10:46 AM Cole [name]  MRN:  [id number] Subjective: "I am feeling better on my current medication." Objective: Patient reports decreased paranoia, auditory hallucinations, paranoia, depression and anxiety. Patient reports that he has been feeling clear headed since his medications were adjusted. He denies suicidal or homicidal ideation, intent or plan. Patient is compliant with his current medications and has not endorsed any adverse reactions. 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 MDD with psychosis                            Substance abuse  ADL's:  Intact  Sleep: Fair  Appetite:  Fair  Suicidal Ideation: denies Plan:  denies  Intent:  denies Means:  denies Homicidal Ideation: denies Plan:  denies Intent:  denies Means:  denies AEB (as evidenced by):  Psychiatric Specialty Exam: Review of Systems  Constitutional: Negative.   HENT: Negative.   Eyes: Negative.   Respiratory: Negative.   Cardiovascular: Negative.   Gastrointestinal: Negative.   Genitourinary: Negative.   Musculoskeletal: Negative.   Skin: Negative.   Neurological: Negative.   Endo/Heme/Allergies: Negative.   Psychiatric/Behavioral: Positive for substance abuse. The patient is nervous/anxious.     Blood pressure 138/80, pulse 100, temperature 97.3 F (36.3 C), temperature source Oral, resp. rate 20, height 5\' 9"  (1.753 m), weight 90.266 kg (199 lb).Body mass index is 29.37 kg/(m^2).  General Appearance: fairly groomed  Patent attorney::  Minimal  Speech:  normal   Volume:  Decreased  Mood: dysphoria  Affect:  constricted  Thought Process:  Circumstantial  Orientation:  Full (Time, Place, and Person)  Thought Content: reality based  Suicidal Thoughts:  denies  Homicidal Thoughts:  No  Memory:  Immediate;   Fair Recent;   Fair Remote;   Fair  Judgement:  Marginal  Insight:  Shallow  Psychomotor Activity:  normal  Concentration:  Fair  Recall:  Fair  Akathisia:  No  Handed:  Right  AIMS (if indicated):     Assets:  Communication Skills Desire for Improvement Physical Health  Sleep:  Number of Hours: 6.75   Current Medications: Current Facility-Administered Medications  Medication Dose Route Frequency Provider Last Rate Last Dose  . acetaminophen (TYLENOL) tablet 650 mg  650 mg Oral Q4H PRN Court Joy, PA-C   650 mg at 07/19/13 1457  . alum & mag hydroxide-simeth (MAALOX/MYLANTA) 200-200-20 MG/5ML suspension 30 mL  30 mL Oral PRN Court Joy, PA-C      . benztropine (COGENTIN) tablet 1 mg  1 mg Oral BID Analiese Krupka   1 mg at 07/23/13 0813  . carbamazepine (TEGRETOL XR) 12 hr tablet 200 mg  200 mg Oral BID Lyndell Gillyard   200 mg at 07/23/13 0813  . haloperidol (HALDOL) tablet 5 mg  5 mg Oral BID Merrell Borsuk   5 mg at 07/23/13 0813  . hydrOXYzine (ATARAX/VISTARIL) tablet 50 mg  50 mg Oral TID PRN Nur Rabold   50 mg at 07/22/13 0246  . ibuprofen (ADVIL,MOTRIN) tablet 600 mg  600 mg Oral Q8H PRN Court Joy, PA-C   600  mg at 07/19/13 2152  . magnesium hydroxide (MILK OF MAGNESIA) suspension 30 mL  30 mL Oral Daily PRN Court Joy, PA-C      . nicotine (NICODERM CQ - dosed in mg/24 hours) patch 21 mg  21 mg Transdermal Daily Court Joy, PA-C   21 mg at 07/23/13 0813  . OLANZapine zydis (ZYPREXA) disintegrating tablet 10 mg  10 mg Oral Q8H PRN Imara Standiford   10 mg at 07/20/13 0924  . ondansetron (ZOFRAN) tablet 4 mg  4 mg Oral Q8H PRN Court Joy, PA-C   4 mg at 07/20/13 9604  . traZODone (DESYREL)  tablet 50 mg  50 mg Oral QHS PRN Dayron Odland   50 mg at 07/19/13 2149    Lab Results: No results found for this or any previous visit (from the past 48 hour(s)).  Physical Findings: AIMS: Facial and Oral Movements Muscles of Facial Expression: None, normal Lips and Perioral Area: None, normal Jaw: None, normal Tongue: None, normal,Extremity Movements Upper (arms, wrists, hands, fingers): None, normal Lower (legs, knees, ankles, toes): None, normal, Trunk Movements Neck, shoulders, hips: None, normal, Overall Severity Severity of abnormal movements (highest score from questions above): None, normal Incapacitation due to abnormal movements: None, normal Patient's awareness of abnormal movements (rate only patient's report): No Awareness, Dental Status Current problems with teeth and/or dentures?: No Does patient usually wear dentures?: No  CIWA:  CIWA-Ar Total: 0 COWS:  COWS Total Score: 0  Treatment Plan Summary: Daily contact with patient to assess and evaluate symptoms and progress in treatment Medication management  Plan:1. Admit for crisis management and stabilization. 2. Medication management to reduce current symptoms to base line and improve the     patient's overall level of functioning 3. Treat health problems as indicated. 4. Develop treatment plan to decrease risk of relapse upon discharge and the need for     readmission. 5. Psycho-social education regarding relapse prevention and self care. 6. Health care follow up as needed for medical problems. 7. Restart home medications where appropriate. 8. Continue current medication regimen. 9. Tegretol level 07/24/13   Medical Decision Making Problem Points:  Established problem, improving (1), Review of last therapy session (1) and Review of psycho-social stressors (1) Data Points:  Order Aims Assessment (2) Review of medication regiment & side effects (2) Review of new medications or change in dosage (2)  I certify  that inpatient services furnished can reasonably be expected to improve the patient's condition.   Thedore Mins, MD 07/23/2013, 10:46 AM

## 2013-07-23 NOTE — [facility] Group Notes (Signed)
[facility] LCSW Aftercare Discharge Planning Group Note   07/23/2013 11:12 AM  Participation Quality:  Engaged  Mood/Affect:  Blunted  Depression Rating:  denies  Anxiety Rating:  denies  Thoughts of Suicide:  No Will you contract for safety?   NA  Current AVH:  No  Plan for Discharge/Comments:  This is the first group that Kavir came to and stayed for the entirety.  He denies any symptoms and states he found out that his family will allow him to return home, so that is his plan. He had not previously indicated that he may not be able to return there to me.  States he will follow up at [facility] in [place].  Transportation Means: family  Supports: family  [place], Baldo Daub

## 2013-07-23 NOTE — Progress Notes (Addendum)
D: Patient in the dayroom on approach eating his snack.  Patient states he has a good day and states he does not have any complaints.  Patient states he has learned to stay calm and not to give up hope.  Patient denies SI/HI and AVH. A: Staff to monitor Q 15 mins for safety.  Encouragement and support offered.  No scheduled medications administered per orders.  Vistaril administered for anxiety prn R: Patient remains safe on the unit.  Patient attended group tonight.  Patient visible on the unit.  Patient taking administered medications.

## 2013-07-23 NOTE — [facility] Group Notes (Signed)
[facility] LCSW Group Therapy  07/23/2013 1:15 pm  Type of Therapy: Process Group Therapy  Participation Level:  Lawrnce was there for the beginning of group.  He left about half way through before the presenter had gotten to him. Did not share any information.    Summary of Progress/Problems: Today's group addressed the issue of overcoming obstacles.  Patients were asked to identify their biggest obstacle post d/c that stands in the way of their on-going success, and then problem solve as to how to manage this.  [name] 07/23/2013   3:34 PM

## 2013-07-23 NOTE — Progress Notes (Signed)
Seen and agreed. Ioane Bhola, MD 

## 2013-07-23 NOTE — Progress Notes (Signed)
[facility] Adult Case Management Discharge Plan :  Will you be returning to the same living situation after discharge: Yes,  home At discharge, do you have transportation home?:Yes,  family Do you have the ability to pay for your medications:Yes,  MCD  Release of information consent forms completed and in the chart;  Patient's signature needed at discharge.  Patient to Follow up at: Follow-up Information   Follow up with [facility] On 08/08/2013. (Wednesday at 10:30 with Dr Janeece Riggers)    Contact information:   [address]  [address]  [place]  [[phone]] [phone]      Patient denies SI/HI:   Yes,  yes    Aeronautical engineer and Suicide Prevention discussed:  Yes,  yes  [name] 07/23/2013, 3:56 PM

## 2013-07-23 NOTE — Progress Notes (Signed)
D: Pt presents with flat affect and depressed mood. Pt reports depression 2/10 and hopeless 2/10. Pt denies SI/HI/AVH today. Pt compliant with taking meds and attending groups. No complaints verbalized by pt. Pt has minimal interaction on the milieu. A: Medications administered as ordered per MD. Verbal support given. Pt encouraged to attend groups. 15 minute checks performed for safety, R: Pt safety maintained. Pt plans to discharge to his grandmother house in [place]. Pt stated that he may have difficulty finding transportation.
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
## 2013-07-24 NOTE — Progress Notes (Signed)
Discharge note: Pt received both written and verbal discharge instructions. Pt agreed to f/u appt and med regimen. Pt received all belongings from room and locker. Pt denied SI/HI/AVH prior to discharge. Pt received prescriptions and discharge summary.

## 2013-07-25 NOTE — Discharge Summary (Signed)
Seen and agreed. Mylz Yuan, MD 

## 2013-07-25 NOTE — Progress Notes (Signed)
Patient Discharge Instructions:  After Visit Summary (AVS):   Faxed to:  07/25/13 Discharge Summary Note:   Faxed to:  07/25/13 Psychiatric Admission Assessment Note:   Faxed to:  07/25/13 Suicide Risk Assessment - Discharge Assessment:   Faxed to:  07/25/13 Faxed/Sent to the Next Level Care provider:  07/25/13 Faxed to [facility] @ [phone]  Jerelene Redden, 07/25/2013, 2:42 PM

## 2013-07-30 ENCOUNTER — Ambulatory Visit (HOSPITAL_COMMUNITY): Payer: Medicaid Other | Admitting: Physical Therapy

## 2013-08-07 ENCOUNTER — Ambulatory Visit (HOSPITAL_COMMUNITY): Payer: Self-pay | Admitting: Physical Therapy

## 2013-08-14 ENCOUNTER — Ambulatory Visit (HOSPITAL_COMMUNITY): Payer: Self-pay | Admitting: Physical Therapy

## 2013-08-17 ENCOUNTER — Ambulatory Visit (HOSPITAL_COMMUNITY)
Admission: RE | Admit: 2013-08-17 | Discharge: 2013-08-17 | Disposition: A | Discharge status: Home / Self Care | Payer: Medicaid Other | Source: Ambulatory Visit | Attending: Anesthesiology | Admitting: Anesthesiology

## 2013-08-17 DIAGNOSIS — M545 Low back pain, unspecified: Secondary | ICD-10-CM | POA: Insufficient documentation

## 2013-08-17 DIAGNOSIS — M79609 Pain in unspecified limb: Secondary | ICD-10-CM | POA: Insufficient documentation

## 2013-08-17 DIAGNOSIS — IMO0001 Reserved for inherently not codable concepts without codable children: Secondary | ICD-10-CM | POA: Insufficient documentation
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
## 2013-09-06 ENCOUNTER — Emergency Department (HOSPITAL_COMMUNITY)
Admission: EM | Admit: 2013-09-06 | Discharge: 2013-09-06 | Disposition: A | Discharge status: Home / Self Care | Payer: Medicaid Other | Attending: Emergency Medicine | Admitting: Emergency Medicine

## 2013-09-06 ENCOUNTER — Encounter (HOSPITAL_COMMUNITY): Payer: Self-pay | Admitting: Emergency Medicine

## 2013-09-06 DIAGNOSIS — Z8669 Personal history of other diseases of the nervous system and sense organs: Secondary | ICD-10-CM | POA: Insufficient documentation

## 2013-09-06 DIAGNOSIS — R197 Diarrhea, unspecified: Secondary | ICD-10-CM

## 2013-09-06 DIAGNOSIS — F411 Generalized anxiety disorder: Secondary | ICD-10-CM | POA: Insufficient documentation

## 2013-09-06 DIAGNOSIS — IMO0001 Reserved for inherently not codable concepts without codable children: Secondary | ICD-10-CM | POA: Insufficient documentation

## 2013-09-06 DIAGNOSIS — Z79899 Other long term (current) drug therapy: Secondary | ICD-10-CM | POA: Insufficient documentation

## 2013-09-06 DIAGNOSIS — R1084 Generalized abdominal pain: Secondary | ICD-10-CM | POA: Insufficient documentation

## 2013-09-06 DIAGNOSIS — J45909 Unspecified asthma, uncomplicated: Secondary | ICD-10-CM | POA: Insufficient documentation

## 2013-09-06 DIAGNOSIS — F3289 Other specified depressive episodes: Secondary | ICD-10-CM | POA: Insufficient documentation

## 2013-09-06 DIAGNOSIS — R52 Pain, unspecified: Secondary | ICD-10-CM | POA: Insufficient documentation

## 2013-09-06 DIAGNOSIS — Z87828 Personal history of other (healed) physical injury and trauma: Secondary | ICD-10-CM | POA: Insufficient documentation

## 2013-09-06 DIAGNOSIS — R110 Nausea: Secondary | ICD-10-CM | POA: Insufficient documentation

## 2013-09-06 DIAGNOSIS — R109 Unspecified abdominal pain: Secondary | ICD-10-CM

## 2013-09-06 DIAGNOSIS — R51 Headache: Secondary | ICD-10-CM | POA: Insufficient documentation

## 2013-09-06 DIAGNOSIS — F329 Major depressive disorder, single episode, unspecified: Secondary | ICD-10-CM | POA: Insufficient documentation

## 2013-09-06 DIAGNOSIS — Z9889 Other specified postprocedural states: Secondary | ICD-10-CM | POA: Insufficient documentation

## 2013-09-06 DIAGNOSIS — F209 Schizophrenia, unspecified: Secondary | ICD-10-CM | POA: Insufficient documentation

## 2013-09-06 DIAGNOSIS — F17200 Nicotine dependence, unspecified, uncomplicated: Secondary | ICD-10-CM | POA: Insufficient documentation

## 2013-09-06 LAB — BASIC METABOLIC PANEL
BUN: 10 mg/dL (ref 6–23)
CHLORIDE: 105 meq/L (ref 96–112)
CO2: 26 meq/L (ref 19–32)
Calcium: 8.9 mg/dL (ref 8.4–10.5)
Creatinine, Ser: 0.87 mg/dL (ref 0.50–1.35)
GFR calc non Af Amer: >90 mL/min (ref >90)
Glucose, Bld: 97 mg/dL (ref 70–99)
POTASSIUM: 3.9 meq/L (ref 3.7–5.3)
SODIUM: 141 meq/L (ref 137–147)

## 2013-09-06 MED ORDER — ONDANSETRON 4 MG PO TBDP: 4.0000 mg | ORAL_TABLET | Freq: Three times a day (TID) | ORAL | Status: DC | PRN

## 2013-09-06 MED ORDER — OXYCODONE-ACETAMINOPHEN 5-325 MG PO TABS: 2.0000 | ORAL_TABLET | ORAL | Status: DC | PRN

## 2013-09-06 MED ORDER — DIPHENOXYLATE-ATROPINE 2.5-0.025 MG PO TABS: 1.0000 | ORAL_TABLET | Freq: Four times a day (QID) | ORAL | Status: DC | PRN

## 2013-09-06 MED ORDER — GLYCOPYRROLATE 0.2 MG/ML IJ SOLN
0.1000 mg | Freq: Once | INTRAMUSCULAR | Status: AC
  Administered 2013-09-06: 0.1 mg via INTRAVENOUS
  Filled 2013-09-06: qty 1

## 2013-09-06 MED ORDER — DIPHENOXYLATE-ATROPINE 2.5-0.025 MG PO TABS
2.0000 | ORAL_TABLET | Freq: Once | ORAL | Status: AC
  Administered 2013-09-06: 2 via ORAL
  Filled 2013-09-06: qty 2

## 2013-09-06 MED ORDER — HYDROCODONE-ACETAMINOPHEN 5-325 MG PO TABS: 1.0000 | ORAL_TABLET | ORAL | Status: DC | PRN

## 2013-09-06 MED ORDER — SODIUM CHLORIDE 0.9 % IV BOLUS (SEPSIS)
1000.0000 mL | Freq: Once | INTRAVENOUS | Status: AC
  Administered 2013-09-06: 1000 mL via INTRAVENOUS

## 2013-09-06 MED ORDER — ONDANSETRON HCL 4 MG/2ML IJ SOLN
4.0000 mg | Freq: Once | INTRAMUSCULAR | Status: AC
  Administered 2013-09-06: 4 mg via INTRAVENOUS
  Filled 2013-09-06: qty 2

## 2013-09-06 NOTE — ED Notes (Signed)
Pt c/o abd pain and diarrhea x 3 days 
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
## 2013-09-06 NOTE — Discharge Instructions (Signed)
Abdominal Pain Many things can cause belly (abdominal) pain. Most times, the belly pain is not dangerous. The amount of belly pain does not tell how serious the problem may be. Many cases of belly pain can be watched and treated at home. HOME CARE   Do not take medicines that help you go poop (laxatives) unless told to by your doctor.  Only take medicine as told by your doctor.  Eat or drink as told by your doctor. Your doctor will tell you if you should be on a special diet. GET HELP RIGHT AWAY IF:   The pain does not go away.  You have a fever.  You keep throwing up (vomiting).  The pain changes and is only in the right or left part of the belly.  You have bloody or tarry looking poop. MAKE SURE YOU:   Understand these instructions.  Will watch your condition.  Will get help right away if you are not doing well or get worse. Document Released: 02/02/2008 Document Revised: 11/08/2011 Document Reviewed: 09/01/2009 [facility] Patient Information 2014 [place], [place].  Nausea and Vomiting Nausea means you feel sick to your stomach. Throwing up (vomiting) is a reflex where stomach contents come out of your mouth. HOME CARE   Take medicine as told by your doctor.  Do not force yourself to eat. However, you do need to drink fluids.  If you feel like eating, eat a normal diet as told by your doctor.  Eat rice, wheat, potatoes, bread, lean meats, yogurt, fruits, and vegetables.  Avoid high-fat foods.  Drink enough fluids to keep your pee (urine) clear or pale yellow.  Ask your doctor how to replace body fluid losses (rehydrate). Signs of body fluid loss (dehydration) include:  Feeling very thirsty.  Dry lips and mouth.  Feeling dizzy.  Dark pee.  Peeing less than normal.  Feeling confused.  Fast breathing or heart rate. GET HELP RIGHT AWAY IF:   You have blood in your throw up.  You have black or bloody poop (stool).  You have a bad headache or stiff  neck.  You feel confused.  You have bad belly (abdominal) pain.  You have chest pain or trouble breathing.  You do not pee at least once every 8 hours.  You have cold, clammy skin.  You keep throwing up after 24 to 48 hours.  You have a fever. MAKE SURE YOU:   Understand these instructions.  Will watch your condition.  Will get help right away if you are not doing well or get worse. Document Released: 02/02/2008 Document Revised: 11/08/2011 Document Reviewed: 01/15/2011 [facility] Patient Information 2014 [place], [place].

## 2013-11-13 ENCOUNTER — Encounter (HOSPITAL_COMMUNITY): Payer: Self-pay | Admitting: Emergency Medicine

## 2013-11-13 ENCOUNTER — Emergency Department (HOSPITAL_COMMUNITY)
Admission: EM | Admit: 2013-11-13 | Discharge: 2013-11-14 | Disposition: A | Discharge status: Home / Self Care | Payer: Medicaid Other | Attending: Emergency Medicine | Admitting: Emergency Medicine

## 2013-11-13 DIAGNOSIS — G8921 Chronic pain due to trauma: Secondary | ICD-10-CM | POA: Insufficient documentation

## 2013-11-13 DIAGNOSIS — G8929 Other chronic pain: Secondary | ICD-10-CM

## 2013-11-13 DIAGNOSIS — M549 Dorsalgia, unspecified: Secondary | ICD-10-CM | POA: Insufficient documentation

## 2013-11-13 DIAGNOSIS — G4700 Insomnia, unspecified: Secondary | ICD-10-CM | POA: Insufficient documentation

## 2013-11-13 DIAGNOSIS — J45909 Unspecified asthma, uncomplicated: Secondary | ICD-10-CM | POA: Insufficient documentation

## 2013-11-13 DIAGNOSIS — F329 Major depressive disorder, single episode, unspecified: Secondary | ICD-10-CM | POA: Insufficient documentation

## 2013-11-13 DIAGNOSIS — Z79899 Other long term (current) drug therapy: Secondary | ICD-10-CM | POA: Insufficient documentation

## 2013-11-13 DIAGNOSIS — F209 Schizophrenia, unspecified: Secondary | ICD-10-CM | POA: Insufficient documentation

## 2013-11-13 DIAGNOSIS — F411 Generalized anxiety disorder: Secondary | ICD-10-CM | POA: Insufficient documentation

## 2013-11-13 DIAGNOSIS — G609 Hereditary and idiopathic neuropathy, unspecified: Secondary | ICD-10-CM | POA: Insufficient documentation

## 2013-11-13 DIAGNOSIS — F3289 Other specified depressive episodes: Secondary | ICD-10-CM | POA: Insufficient documentation

## 2013-11-13 DIAGNOSIS — F17200 Nicotine dependence, unspecified, uncomplicated: Secondary | ICD-10-CM | POA: Insufficient documentation

## 2013-11-13 NOTE — ED Notes (Signed)
Patient complaining of back pain. States "I had an epidural yesterday and it hurts in my back where they gave me the injection. I normally take oxycodone for the pain but I ran out."

## 2013-11-14 MED ORDER — OXYCODONE-ACETAMINOPHEN 10-325 MG PO TABS: 1.0000 | ORAL_TABLET | ORAL | Status: DC | PRN

## 2013-11-14 MED ORDER — OXYCODONE-ACETAMINOPHEN 5-325 MG PO TABS
2.0000 | ORAL_TABLET | Freq: Once | ORAL | Status: AC
  Administered 2013-11-14: 2 via ORAL
  Filled 2013-11-14: qty 2
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
## 2014-01-22 ENCOUNTER — Encounter (HOSPITAL_COMMUNITY): Payer: Self-pay | Admitting: Emergency Medicine

## 2014-01-22 ENCOUNTER — Emergency Department (HOSPITAL_COMMUNITY)
Admission: EM | Admit: 2014-01-22 | Discharge: 2014-01-22 | Disposition: A | Discharge status: Home / Self Care | Payer: Medicaid Other | Attending: Emergency Medicine | Admitting: Emergency Medicine

## 2014-01-22 DIAGNOSIS — Y92009 Unspecified place in unspecified non-institutional (private) residence as the place of occurrence of the external cause: Secondary | ICD-10-CM | POA: Diagnosis not present

## 2014-01-22 DIAGNOSIS — S59919A Unspecified injury of unspecified forearm, initial encounter: Secondary | ICD-10-CM

## 2014-01-22 DIAGNOSIS — IMO0002 Reserved for concepts with insufficient information to code with codable children: Secondary | ICD-10-CM | POA: Diagnosis not present

## 2014-01-22 DIAGNOSIS — S59909A Unspecified injury of unspecified elbow, initial encounter: Secondary | ICD-10-CM | POA: Diagnosis not present

## 2014-01-22 DIAGNOSIS — S6990XA Unspecified injury of unspecified wrist, hand and finger(s), initial encounter: Secondary | ICD-10-CM

## 2014-01-22 DIAGNOSIS — F329 Major depressive disorder, single episode, unspecified: Secondary | ICD-10-CM | POA: Insufficient documentation

## 2014-01-22 DIAGNOSIS — G479 Sleep disorder, unspecified: Secondary | ICD-10-CM | POA: Insufficient documentation

## 2014-01-22 DIAGNOSIS — W19XXXA Unspecified fall, initial encounter: Secondary | ICD-10-CM

## 2014-01-22 DIAGNOSIS — F3289 Other specified depressive episodes: Secondary | ICD-10-CM | POA: Insufficient documentation

## 2014-01-22 DIAGNOSIS — J45909 Unspecified asthma, uncomplicated: Secondary | ICD-10-CM | POA: Insufficient documentation

## 2014-01-22 DIAGNOSIS — Z87828 Personal history of other (healed) physical injury and trauma: Secondary | ICD-10-CM | POA: Diagnosis not present

## 2014-01-22 DIAGNOSIS — F411 Generalized anxiety disorder: Secondary | ICD-10-CM | POA: Insufficient documentation

## 2014-01-22 DIAGNOSIS — F17200 Nicotine dependence, unspecified, uncomplicated: Secondary | ICD-10-CM | POA: Diagnosis not present

## 2014-01-22 DIAGNOSIS — F22 Delusional disorders: Secondary | ICD-10-CM | POA: Diagnosis not present

## 2014-01-22 DIAGNOSIS — F209 Schizophrenia, unspecified: Secondary | ICD-10-CM | POA: Diagnosis not present

## 2014-01-22 DIAGNOSIS — Z8669 Personal history of other diseases of the nervous system and sense organs: Secondary | ICD-10-CM | POA: Diagnosis not present

## 2014-01-22 DIAGNOSIS — Y939 Activity, unspecified: Secondary | ICD-10-CM | POA: Diagnosis not present

## 2014-01-22 DIAGNOSIS — Z79899 Other long term (current) drug therapy: Secondary | ICD-10-CM | POA: Diagnosis not present

## 2014-01-22 DIAGNOSIS — W108XXA Fall (on) (from) other stairs and steps, initial encounter: Secondary | ICD-10-CM | POA: Insufficient documentation

## 2014-01-22 DIAGNOSIS — Z008 Encounter for other general examination: Secondary | ICD-10-CM | POA: Diagnosis present

## 2014-01-22 MED ORDER — HALOPERIDOL 5 MG PO TABS: 5.0000 mg | ORAL_TABLET | Freq: Two times a day (BID) | ORAL | Status: DC

## 2014-01-22 MED ORDER — TRAZODONE HCL 50 MG PO TABS: 50.0000 mg | ORAL_TABLET | Freq: Every evening | ORAL | Status: DC | PRN

## 2014-01-22 MED ORDER — BENZTROPINE MESYLATE 1 MG PO TABS: 1.0000 mg | ORAL_TABLET | Freq: Two times a day (BID) | ORAL | Status: DC

## 2014-01-22 MED ORDER — KETOROLAC TROMETHAMINE 30 MG/ML IJ SOLN
30.0000 mg | Freq: Once | INTRAMUSCULAR | Status: AC
  Administered 2014-01-22: 30 mg via INTRAMUSCULAR
  Filled 2014-01-22: qty 1

## 2014-01-22 MED ORDER — TRAMADOL HCL 50 MG PO TABS
50.0000 mg | ORAL_TABLET | Freq: Once | ORAL | Status: AC
  Administered 2014-01-22: 50 mg via ORAL
  Filled 2014-01-22: qty 1

## 2014-01-22 MED ORDER — CLONAZEPAM 0.5 MG PO TABS: 0.5000 mg | ORAL_TABLET | Freq: Three times a day (TID) | ORAL | Status: DC | PRN

## 2014-01-22 MED ORDER — TRAMADOL HCL 50 MG PO TABS: 50.0000 mg | ORAL_TABLET | Freq: Four times a day (QID) | ORAL | Status: DC | PRN

## 2014-01-22 MED ORDER — CARBAMAZEPINE ER 200 MG PO TB12: 200.0000 mg | ORAL_TABLET | Freq: Two times a day (BID) | ORAL | Status: DC

## 2014-01-22 NOTE — Discharge Instructions (Signed)
As requested you have received refills of your psychiatric medication and pain medication.  However, it is important that you continue to have your condition managed by the pain management physician and your psychiatrist.  Please call today to arrange appropriate ongoing care.  If you develop new, or concerning changes in your condition, please be sure to return here immediately.
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
## 2014-01-22 NOTE — ED Notes (Signed)
Pt used phone to call friend to come pick him up, pt discharged to lobby,

## 2014-01-22 NOTE — ED Notes (Signed)
Asked pt triage questions but pt nonverbal at this time.  When asked if has any pain, pt pointed to neck, back, and both legs.  EMS reports pt had similar episodes enroute and appeared that he couldn't speak.

## 2014-01-22 NOTE — ED Notes (Signed)
Pt resting in room, sitter at bedside,

## 2014-01-22 NOTE — ED Notes (Signed)
Pt given dinner tray, requesting pain medication for leg and back pain, Dr Jeraldine Loots at bedside speaking with pt, sitter remains at bedside,
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
## 2014-02-16 ENCOUNTER — Emergency Department: Payer: Self-pay | Admitting: Emergency Medicine

## 2014-02-16 LAB — CBC WITH DIFFERENTIAL/PLATELET
BASOS ABS: 0 10*3/uL (ref 0.0–0.1)
Basophil %: 0.3 %
EOS ABS: 0.6 10*3/uL (ref 0.0–0.7)
Eosinophil %: 9.5 %
HCT: 45.2 % (ref 40.0–52.0)
HGB: 14.5 g/dL (ref 13.0–18.0)
LYMPHS ABS: 1.2 10*3/uL (ref 1.0–3.6)
LYMPHS PCT: 18.1 %
MCH: 25.7 pg — AB (ref 26.0–34.0)
MCHC: 32.2 g/dL (ref 32.0–36.0)
MCV: 80 fL (ref 80–100)
MONO ABS: 0.4 x10 3/mm (ref 0.2–1.0)
Monocyte %: 5.7 %
NEUTROS PCT: 66.4 %
Neutrophil #: 4.4 10*3/uL (ref 1.4–6.5)
PLATELETS: 157 10*3/uL (ref 150–440)
RBC: 5.66 10*6/uL (ref 4.40–5.90)
RDW: 14.3 % (ref 11.5–14.5)
WBC: 6.7 10*3/uL (ref 3.8–10.6)

## 2014-02-16 LAB — GC/CHLAMYDIA PROBE AMP

## 2014-02-16 LAB — COMPREHENSIVE METABOLIC PANEL
ALBUMIN: 3.8 g/dL (ref 3.4–5.0)
ANION GAP: 6 — AB (ref 7–16)
Alkaline Phosphatase: 57 U/L
BILIRUBIN TOTAL: 0.7 mg/dL (ref 0.2–1.0)
BUN: 7 mg/dL (ref 7–18)
CREATININE: 0.92 mg/dL (ref 0.60–1.30)
Calcium, Total: 8.7 mg/dL (ref 8.5–10.1)
Chloride: 105 mmol/L (ref 98–107)
Co2: 26 mmol/L (ref 21–32)
EGFR (Non-African Amer.): >60
Glucose: 90 mg/dL (ref 65–99)
OSMOLALITY: 271 (ref 275–301)
Potassium: 3.6 mmol/L (ref 3.5–5.1)
SGOT(AST): 23 U/L (ref 15–37)
SGPT (ALT): 23 U/L (ref 12–78)
SODIUM: 137 mmol/L (ref 136–145)
TOTAL PROTEIN: 8 g/dL (ref 6.4–8.2)

## 2014-02-16 LAB — URINALYSIS, COMPLETE
BACTERIA: NONE SEEN
BLOOD: NEGATIVE
Bilirubin,UR: NEGATIVE
Glucose,UR: NEGATIVE mg/dL (ref 0–75)
KETONE: NEGATIVE
Leukocyte Esterase: NEGATIVE
Nitrite: NEGATIVE
PH: 7 (ref 4.5–8.0)
Protein: NEGATIVE
RBC,UR: NONE SEEN /HPF (ref 0–5)
SQUAMOUS EPITHELIAL: NONE SEEN
Specific Gravity: 1.004 (ref 1.003–1.030)
WBC UR: NONE SEEN /HPF (ref 0–5)

## 2014-02-25 ENCOUNTER — Emergency Department (HOSPITAL_COMMUNITY): Payer: Medicaid Other

## 2014-02-25 ENCOUNTER — Emergency Department (HOSPITAL_COMMUNITY)
Admission: EM | Admit: 2014-02-25 | Discharge: 2014-02-25 | Disposition: A | Discharge status: Home / Self Care | Payer: Medicaid Other | Attending: Emergency Medicine | Admitting: Emergency Medicine

## 2014-02-25 ENCOUNTER — Encounter (HOSPITAL_COMMUNITY): Payer: Self-pay | Admitting: Emergency Medicine

## 2014-02-25 DIAGNOSIS — F3289 Other specified depressive episodes: Secondary | ICD-10-CM | POA: Diagnosis not present

## 2014-02-25 DIAGNOSIS — M549 Dorsalgia, unspecified: Secondary | ICD-10-CM

## 2014-02-25 DIAGNOSIS — Y9389 Activity, other specified: Secondary | ICD-10-CM | POA: Insufficient documentation

## 2014-02-25 DIAGNOSIS — J45909 Unspecified asthma, uncomplicated: Secondary | ICD-10-CM | POA: Diagnosis not present

## 2014-02-25 DIAGNOSIS — Z79899 Other long term (current) drug therapy: Secondary | ICD-10-CM | POA: Insufficient documentation

## 2014-02-25 DIAGNOSIS — Y92410 Unspecified street and highway as the place of occurrence of the external cause: Secondary | ICD-10-CM | POA: Diagnosis not present

## 2014-02-25 DIAGNOSIS — G8929 Other chronic pain: Secondary | ICD-10-CM | POA: Diagnosis not present

## 2014-02-25 DIAGNOSIS — F411 Generalized anxiety disorder: Secondary | ICD-10-CM | POA: Diagnosis not present

## 2014-02-25 DIAGNOSIS — IMO0002 Reserved for concepts with insufficient information to code with codable children: Secondary | ICD-10-CM | POA: Diagnosis present

## 2014-02-25 DIAGNOSIS — F329 Major depressive disorder, single episode, unspecified: Secondary | ICD-10-CM | POA: Diagnosis not present

## 2014-02-25 DIAGNOSIS — F17200 Nicotine dependence, unspecified, uncomplicated: Secondary | ICD-10-CM | POA: Diagnosis not present

## 2014-02-25 DIAGNOSIS — F209 Schizophrenia, unspecified: Secondary | ICD-10-CM | POA: Insufficient documentation

## 2014-02-25 MED ORDER — TRAMADOL HCL 50 MG PO TABS: 50.0000 mg | ORAL_TABLET | Freq: Four times a day (QID) | ORAL | Status: DC | PRN

## 2014-02-25 MED ORDER — OXYCODONE-ACETAMINOPHEN 5-325 MG PO TABS
1.0000 | ORAL_TABLET | Freq: Once | ORAL | Status: AC
  Administered 2014-02-25: 1 via ORAL
  Filled 2014-02-25: qty 1

## 2014-02-25 MED ORDER — CYCLOBENZAPRINE HCL 10 MG PO TABS
10.0000 mg | ORAL_TABLET | Freq: Once | ORAL | Status: AC
  Administered 2014-02-25: 10 mg via ORAL
  Filled 2014-02-25: qty 1

## 2014-02-25 MED ORDER — KETOROLAC TROMETHAMINE 60 MG/2ML IM SOLN
60.0000 mg | Freq: Once | INTRAMUSCULAR | Status: AC
  Administered 2014-02-25: 60 mg via INTRAMUSCULAR
  Filled 2014-02-25: qty 2

## 2014-02-25 MED ORDER — CYCLOBENZAPRINE HCL 10 MG PO TABS: 10.0000 mg | ORAL_TABLET | Freq: Two times a day (BID) | ORAL | Status: DC | PRN

## 2014-02-25 NOTE — ED Notes (Signed)
Pt is back and forth to restroom stating he is unable to have a BM, per Clista BernhardtAnne Claire, [facility].
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
## 2014-02-26 NOTE — ED Provider Notes (Signed)
History/physical exam/procedure(s) were performed by non-physician practitioner and as supervising physician I was immediately available for consultation/collaboration. I have reviewed all notes and am in agreement with care and plan.   Danielle S Ray, MD 02/26/14 1327 

## 2014-04-05 ENCOUNTER — Emergency Department (HOSPITAL_COMMUNITY)
Admission: EM | Admit: 2014-04-05 | Discharge: 2014-04-05 | Disposition: A | Discharge status: Home / Self Care | Payer: Medicaid Other | Attending: Emergency Medicine | Admitting: Emergency Medicine

## 2014-04-05 ENCOUNTER — Encounter (HOSPITAL_COMMUNITY): Payer: Self-pay | Admitting: Emergency Medicine

## 2014-04-05 ENCOUNTER — Emergency Department (HOSPITAL_COMMUNITY): Payer: Medicaid Other

## 2014-04-05 DIAGNOSIS — F17200 Nicotine dependence, unspecified, uncomplicated: Secondary | ICD-10-CM | POA: Insufficient documentation

## 2014-04-05 DIAGNOSIS — S139XXA Sprain of joints and ligaments of unspecified parts of neck, initial encounter: Secondary | ICD-10-CM

## 2014-04-05 DIAGNOSIS — Z23 Encounter for immunization: Secondary | ICD-10-CM | POA: Diagnosis not present

## 2014-04-05 DIAGNOSIS — S6391XA Sprain of unspecified part of right wrist and hand, initial encounter: Secondary | ICD-10-CM

## 2014-04-05 DIAGNOSIS — S92911A Unspecified fracture of right toe(s), initial encounter for closed fracture: Secondary | ICD-10-CM

## 2014-04-05 DIAGNOSIS — G609 Hereditary and idiopathic neuropathy, unspecified: Secondary | ICD-10-CM | POA: Insufficient documentation

## 2014-04-05 DIAGNOSIS — S92919A Unspecified fracture of unspecified toe(s), initial encounter for closed fracture: Secondary | ICD-10-CM | POA: Insufficient documentation

## 2014-04-05 DIAGNOSIS — W11XXXA Fall on and from ladder, initial encounter: Secondary | ICD-10-CM | POA: Insufficient documentation

## 2014-04-05 DIAGNOSIS — IMO0002 Reserved for concepts with insufficient information to code with codable children: Secondary | ICD-10-CM | POA: Insufficient documentation

## 2014-04-05 DIAGNOSIS — J45909 Unspecified asthma, uncomplicated: Secondary | ICD-10-CM | POA: Diagnosis not present

## 2014-04-05 DIAGNOSIS — Z8659 Personal history of other mental and behavioral disorders: Secondary | ICD-10-CM | POA: Diagnosis not present

## 2014-04-05 DIAGNOSIS — F411 Generalized anxiety disorder: Secondary | ICD-10-CM | POA: Diagnosis not present

## 2014-04-05 DIAGNOSIS — S335XXA Sprain of ligaments of lumbar spine, initial encounter: Secondary | ICD-10-CM | POA: Insufficient documentation

## 2014-04-05 DIAGNOSIS — S0993XA Unspecified injury of face, initial encounter: Secondary | ICD-10-CM | POA: Insufficient documentation

## 2014-04-05 DIAGNOSIS — Y929 Unspecified place or not applicable: Secondary | ICD-10-CM | POA: Diagnosis not present

## 2014-04-05 DIAGNOSIS — Y9389 Activity, other specified: Secondary | ICD-10-CM | POA: Diagnosis not present

## 2014-04-05 DIAGNOSIS — S6390XA Sprain of unspecified part of unspecified wrist and hand, initial encounter: Secondary | ICD-10-CM | POA: Diagnosis not present

## 2014-04-05 DIAGNOSIS — S199XXA Unspecified injury of neck, initial encounter: Secondary | ICD-10-CM

## 2014-04-05 DIAGNOSIS — S39012A Strain of muscle, fascia and tendon of lower back, initial encounter: Secondary | ICD-10-CM

## 2014-04-05 DIAGNOSIS — T07XXXA Unspecified multiple injuries, initial encounter: Secondary | ICD-10-CM

## 2014-04-05 MED ORDER — TETANUS-DIPHTH-ACELL PERTUSSIS 5-2.5-18.5 LF-MCG/0.5 IM SUSP
0.5000 mL | Freq: Once | INTRAMUSCULAR | Status: AC
  Administered 2014-04-05: 0.5 mL via INTRAMUSCULAR
  Filled 2014-04-05: qty 0.5

## 2014-04-05 MED ORDER — OXYCODONE-ACETAMINOPHEN 5-325 MG PO TABS
2.0000 | ORAL_TABLET | Freq: Once | ORAL | Status: AC
  Administered 2014-04-05: 2 via ORAL
  Filled 2014-04-05: qty 2

## 2014-04-05 MED ORDER — IBUPROFEN 600 MG PO TABS: 600.0000 mg | ORAL_TABLET | Freq: Four times a day (QID) | ORAL | Status: DC | PRN
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
## 2014-04-05 NOTE — Discharge Instructions (Signed)
Abrasions An abrasion is a cut or scrape of the skin. Abrasions do not go through all layers of the skin. HOME CARE  If a bandage (dressing) was put on your wound, change it as told by your doctor. If the bandage sticks, soak it off with warm.  Wash the area with water and soap 2 times a day. Rinse off the soap. Pat the area dry with a clean towel.  Put on medicated cream (ointment) as told by your doctor.  Change your bandage right away if it gets wet or dirty.  Only take medicine as told by your doctor.  See your doctor within 24-48 hours to get your wound checked.  Check your wound for redness, puffiness (swelling), or yellowish-white fluid (pus). GET HELP RIGHT AWAY IF:   You have more pain in the wound.  You have redness, swelling, or tenderness around the wound.  You have pus coming from the wound.  You have a fever or lasting symptoms for more than 2-3 days.  You have a fever and your symptoms suddenly get worse.  You have a bad smell coming from the wound or bandage. MAKE SURE YOU:   Understand these instructions.  Will watch your condition.  Will get help right away if you are not doing well or get worse. Document Released: 02/02/2008 Document Revised: 05/10/2012 Document Reviewed: 07/20/2011 Exit[employer] Patient Information 2015 [employer]. This information is not intended to replace advice given to you by your health care provider. Make sure you discuss any questions you have with your health care provider.  

## 2014-04-05 NOTE — ED Notes (Signed)
Dc instructions reviewed. Ambulated out without difficulty. Pt had IV to left ac from EMS> pt removed IV himself and would not wait for BP at time of dc.
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
## 2014-05-10 ENCOUNTER — Emergency Department (HOSPITAL_COMMUNITY)
Admission: EM | Admit: 2014-05-10 | Discharge: 2014-05-11 | Disposition: A | Discharge status: Home / Self Care | Payer: Medicaid Other | Attending: Emergency Medicine | Admitting: Emergency Medicine

## 2014-05-10 ENCOUNTER — Emergency Department (HOSPITAL_COMMUNITY): Payer: Medicaid Other

## 2014-05-10 ENCOUNTER — Encounter (HOSPITAL_COMMUNITY): Payer: Self-pay | Admitting: Emergency Medicine

## 2014-05-10 DIAGNOSIS — F17200 Nicotine dependence, unspecified, uncomplicated: Secondary | ICD-10-CM | POA: Diagnosis not present

## 2014-05-10 DIAGNOSIS — Y9389 Activity, other specified: Secondary | ICD-10-CM | POA: Diagnosis not present

## 2014-05-10 DIAGNOSIS — H53149 Visual discomfort, unspecified: Secondary | ICD-10-CM | POA: Insufficient documentation

## 2014-05-10 DIAGNOSIS — Z8659 Personal history of other mental and behavioral disorders: Secondary | ICD-10-CM | POA: Diagnosis not present

## 2014-05-10 DIAGNOSIS — Z87828 Personal history of other (healed) physical injury and trauma: Secondary | ICD-10-CM | POA: Diagnosis not present

## 2014-05-10 DIAGNOSIS — W010XXA Fall on same level from slipping, tripping and stumbling without subsequent striking against object, initial encounter: Secondary | ICD-10-CM | POA: Insufficient documentation

## 2014-05-10 DIAGNOSIS — Y929 Unspecified place or not applicable: Secondary | ICD-10-CM | POA: Diagnosis not present

## 2014-05-10 DIAGNOSIS — W19XXXA Unspecified fall, initial encounter: Secondary | ICD-10-CM

## 2014-05-10 DIAGNOSIS — Z8709 Personal history of other diseases of the respiratory system: Secondary | ICD-10-CM | POA: Diagnosis not present

## 2014-05-10 DIAGNOSIS — S0990XA Unspecified injury of head, initial encounter: Secondary | ICD-10-CM | POA: Insufficient documentation

## 2014-05-10 DIAGNOSIS — S060X1A Concussion with loss of consciousness of 30 minutes or less, initial encounter: Secondary | ICD-10-CM | POA: Insufficient documentation

## 2014-05-10 MED ORDER — DIPHENHYDRAMINE HCL 50 MG/ML IJ SOLN
25.0000 mg | Freq: Once | INTRAMUSCULAR | Status: AC
  Administered 2014-05-10: 25 mg via INTRAVENOUS
  Filled 2014-05-10: qty 1

## 2014-05-10 MED ORDER — PROCHLORPERAZINE EDISYLATE 5 MG/ML IJ SOLN
10.0000 mg | Freq: Once | INTRAMUSCULAR | Status: AC
  Administered 2014-05-10: 10 mg via INTRAVENOUS
  Filled 2014-05-10: qty 2

## 2014-05-10 MED ORDER — SODIUM CHLORIDE 0.9 % IV BOLUS (SEPSIS)
1000.0000 mL | Freq: Once | INTRAVENOUS | Status: AC
  Administered 2014-05-10: 1000 mL via INTRAVENOUS
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
## 2014-05-10 NOTE — ED Notes (Signed)
Pt is having nausea and vomiting on arrival

## 2014-05-10 NOTE — ED Notes (Addendum)
Pt tripped and struck back on wooden steps earlier today.  Pt reports that he was unconscious for 5-10 minutes after this.  This occurred 2-3 hours ago.  Pt has had severe HA with photophobia and nausea and blurred vision since.  Pt is alert and oriented at this time with report of severe HA and states that his body is "trembling", nausea and vomiting on arrival

## 2014-05-10 NOTE — ED Notes (Signed)
Continues to feel nauseated and have HA, lights off for comfort.  Pt is alert and oriented
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
## 2014-05-11 MED ORDER — IBUPROFEN 600 MG PO TABS
600.0000 mg | ORAL_TABLET | Freq: Four times a day (QID) | ORAL | Status: DC | PRN
Start: 2014-05-11 — End: 2014-12-27

## 2014-05-11 NOTE — Discharge Instructions (Signed)
Concussion  A concussion, or closed-head injury, is a brain injury caused by a direct blow to the head or by a quick and sudden movement (jolt) of the head or neck. Concussions are usually not life-threatening. Even so, the effects of a concussion can be serious. If you have had a concussion before, you are more likely to experience concussion-like symptoms after a direct blow to the head.   CAUSES  · Direct blow to the head, such as from running into another player during a soccer game, being hit in a fight, or hitting your head on a hard surface.  · A jolt of the head or neck that causes the brain to move back and forth inside the skull, such as in a car crash.  SIGNS AND SYMPTOMS  The signs of a concussion can be hard to notice. Early on, they may be missed by you, family members, and health care providers. You may look fine but act or feel differently.  Symptoms are usually temporary, but they may last for days, weeks, or even longer. Some symptoms may appear right away while others may not show up for hours or days. Every head injury is different. Symptoms include:  · Mild to moderate headaches that will not go away.  · A feeling of pressure inside your head.  · Having more trouble than usual:  ¨ Learning or remembering things you have heard.  ¨ Answering questions.  ¨ Paying attention or concentrating.  ¨ Organizing daily tasks.  ¨ Making decisions and solving problems.  · Slowness in thinking, acting or reacting, speaking, or reading.  · Getting lost or being easily confused.  · Feeling tired all the time or lacking energy (fatigued).  · Feeling drowsy.  · Sleep disturbances.  ¨ Sleeping more than usual.  ¨ Sleeping less than usual.  ¨ Trouble falling asleep.  ¨ Trouble sleeping (insomnia).  · Loss of balance or feeling lightheaded or dizzy.  · Nausea or vomiting.  · Numbness or tingling.  · Increased sensitivity to:  ¨ Sounds.  ¨ Lights.  ¨ Distractions.  · Vision problems or eyes that tire  easily.  · Diminished sense of taste or smell.  · Ringing in the ears.  · Mood changes such as feeling sad or anxious.  · Becoming easily irritated or angry for little or no reason.  · Lack of motivation.  · Seeing or hearing things other people do not see or hear (hallucinations).  DIAGNOSIS  Your health care provider can usually diagnose a concussion based on a description of your injury and symptoms. He or she will ask whether you passed out (lost consciousness) and whether you are having trouble remembering events that happened right before and during your injury.  Your evaluation might include:  · A brain scan to look for signs of injury to the brain. Even if the test shows no injury, you may still have a concussion.  · Blood tests to be sure other problems are not present.  TREATMENT  · Concussions are usually treated in an emergency department, in urgent care, or at a clinic. You may need to stay in the hospital overnight for further treatment.  · Tell your health care provider if you are taking any medicines, including prescription medicines, over-the-counter medicines, and natural remedies. Some medicines, such as blood thinners (anticoagulants) and aspirin, may increase the chance of complications. Also tell your health care provider whether you have had alcohol or are taking illegal drugs. This information   may affect treatment.  · Your health care provider will send you home with important instructions to follow.  · How fast you will recover from a concussion depends on many factors. These factors include how severe your concussion is, what part of your brain was injured, your age, and how healthy you were before the concussion.  · Most people with mild injuries recover fully. Recovery can take time. In general, recovery is slower in older persons. Also, persons who have had a concussion in the past or have other medical problems may find that it takes longer to recover from their current injury.  HOME  CARE INSTRUCTIONS  General Instructions  · Carefully follow the directions your health care provider gave you.  · Only take over-the-counter or prescription medicines for pain, discomfort, or fever as directed by your health care provider.  · Take only those medicines that your health care provider has approved.  · Do not drink alcohol until your health care provider says you are well enough to do so. Alcohol and certain other drugs may slow your recovery and can put you at risk of further injury.  · If it is harder than usual to remember things, write them down.  · If you are easily distracted, try to do one thing at a time. For example, do not try to watch TV while fixing dinner.  · Talk with family members or close friends when making important decisions.  · Keep all follow-up appointments. Repeated evaluation of your symptoms is recommended for your recovery.  · Watch your symptoms and tell others to do the same. Complications sometimes occur after a concussion. Older adults with a brain injury may have a higher risk of serious complications, such as a blood clot on the brain.  · Tell your teachers, school nurse, school counselor, coach, athletic trainer, or work manager about your injury, symptoms, and restrictions. Tell them about what you can or cannot do. They should watch for:  ¨ Increased problems with attention or concentration.  ¨ Increased difficulty remembering or learning new information.  ¨ Increased time needed to complete tasks or assignments.  ¨ Increased irritability or decreased ability to cope with stress.  ¨ Increased symptoms.  · Rest. Rest helps the brain to heal. Make sure you:  ¨ Get plenty of sleep at night. Avoid staying up late at night.  ¨ Keep the same bedtime hours on weekends and weekdays.  ¨ Rest during the day. Take daytime naps or rest breaks when you feel tired.  · Limit activities that require a lot of thought or concentration. These include:  ¨ Doing homework or job-related  work.  ¨ Watching TV.  ¨ Working on the computer.  · Avoid any situation where there is potential for another head injury (football, hockey, soccer, basketball, martial arts, downhill snow sports and horseback riding). Your condition will get worse every time you experience a concussion. You should avoid these activities until you are evaluated by the appropriate follow-up health care providers.  Returning To Your Regular Activities  You will need to return to your normal activities slowly, not all at once. You must give your body and brain enough time for recovery.  · Do not return to sports or other athletic activities until your health care provider tells you it is safe to do so.  · Ask your health care provider when you can drive, ride a bicycle, or operate heavy machinery. Your ability to react may be slower after a   brain injury. Never do these activities if you are dizzy.  · Ask your health care provider about when you can return to work or school.  Preventing Another Concussion  It is very important to avoid another brain injury, especially before you have recovered. In rare cases, another injury can lead to permanent brain damage, brain swelling, or death. The risk of this is greatest during the first 7-10 days after a head injury. Avoid injuries by:  · Wearing a seat belt when riding in a car.  · Drinking alcohol only in moderation.  · Wearing a helmet when biking, skiing, skateboarding, skating, or doing similar activities.  · Avoiding activities that could lead to a second concussion, such as contact or recreational sports, until your health care provider says it is okay.  · Taking safety measures in your home.  ¨ Remove clutter and tripping hazards from floors and stairways.  ¨ Use grab bars in bathrooms and handrails by stairs.  ¨ Place non-slip mats on floors and in bathtubs.  ¨ Improve lighting in dim areas.  SEEK MEDICAL CARE IF:  · You have increased problems paying attention or  concentrating.  · You have increased difficulty remembering or learning new information.  · You need more time to complete tasks or assignments than before.  · You have increased irritability or decreased ability to cope with stress.  · You have more symptoms than before.  Seek medical care if you have any of the following symptoms for more than 2 weeks after your injury:  · Lasting (chronic) headaches.  · Dizziness or balance problems.  · Nausea.  · Vision problems.  · Increased sensitivity to noise or light.  · Depression or mood swings.  · Anxiety or irritability.  · Memory problems.  · Difficulty concentrating or paying attention.  · Sleep problems.  · Feeling tired all the time.  SEEK IMMEDIATE MEDICAL CARE IF:  · You have severe or worsening headaches. These may be a sign of a blood clot in the brain.  · You have weakness (even if only in one hand, leg, or part of the face).  · You have numbness.  · You have decreased coordination.  · You vomit repeatedly.  · You have increased sleepiness.  · One pupil is larger than the other.  · You have convulsions.  · You have slurred speech.  · You have increased confusion. This may be a sign of a blood clot in the brain.  · You have increased restlessness, agitation, or irritability.  · You are unable to recognize people or places.  · You have neck pain.  · It is difficult to wake you up.  · You have unusual behavior changes.  · You lose consciousness.  MAKE SURE YOU:  · Understand these instructions.  · Will watch your condition.  · Will get help right away if you are not doing well or get worse.  Document Released: 11/06/2003 Document Revised: 08/21/2013 Document Reviewed: 03/08/2013  ExitCare® Patient Information ©2015 [employer]. This information is not intended to replace advice given to you by your health care provider. Make sure you discuss any questions you have with your health care provider.

## 2014-05-22 ENCOUNTER — Encounter (HOSPITAL_COMMUNITY): Payer: Self-pay | Admitting: Emergency Medicine

## 2014-05-22 ENCOUNTER — Emergency Department (HOSPITAL_COMMUNITY)
Admission: EM | Admit: 2014-05-22 | Discharge: 2014-05-22 | Disposition: A | Discharge status: Home / Self Care | Payer: Medicaid Other | Attending: Emergency Medicine | Admitting: Emergency Medicine

## 2014-05-22 DIAGNOSIS — Z79899 Other long term (current) drug therapy: Secondary | ICD-10-CM | POA: Insufficient documentation

## 2014-05-22 DIAGNOSIS — F17200 Nicotine dependence, unspecified, uncomplicated: Secondary | ICD-10-CM | POA: Diagnosis not present

## 2014-05-22 DIAGNOSIS — M25562 Pain in left knee: Secondary | ICD-10-CM

## 2014-05-22 DIAGNOSIS — F411 Generalized anxiety disorder: Secondary | ICD-10-CM | POA: Diagnosis not present

## 2014-05-22 DIAGNOSIS — G8929 Other chronic pain: Secondary | ICD-10-CM | POA: Insufficient documentation

## 2014-05-22 DIAGNOSIS — F3289 Other specified depressive episodes: Secondary | ICD-10-CM | POA: Insufficient documentation

## 2014-05-22 DIAGNOSIS — M25569 Pain in unspecified knee: Secondary | ICD-10-CM | POA: Insufficient documentation

## 2014-05-22 DIAGNOSIS — Z87828 Personal history of other (healed) physical injury and trauma: Secondary | ICD-10-CM | POA: Diagnosis not present

## 2014-05-22 DIAGNOSIS — J45909 Unspecified asthma, uncomplicated: Secondary | ICD-10-CM | POA: Insufficient documentation

## 2014-05-22 DIAGNOSIS — F329 Major depressive disorder, single episode, unspecified: Secondary | ICD-10-CM | POA: Insufficient documentation

## 2014-05-22 NOTE — ED Notes (Signed)
Pt would not take both crutches. He insisted he only need one.
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
## 2014-05-22 NOTE — ED Notes (Signed)
Pt c/o left knee pain. Pt states he has been  Walking a lot and playing basketball. Pt states he is out of pain meds.

## 2014-06-17 ENCOUNTER — Telehealth: Payer: Self-pay | Admitting: Physical Medicine & Rehabilitation

## 2014-06-17 NOTE — Telephone Encounter (Signed)
PATIENT HAS NOT BEEN SEEN IN THIS OFFICE, NO RECORDS SENT...JMN

## 2014-07-08 ENCOUNTER — Emergency Department (HOSPITAL_COMMUNITY)
Admission: EM | Admit: 2014-07-08 | Discharge: 2014-07-08 | Disposition: A | Discharge status: Home / Self Care | Payer: Medicaid Other | Attending: Emergency Medicine | Admitting: Emergency Medicine

## 2014-07-08 ENCOUNTER — Encounter (HOSPITAL_COMMUNITY): Payer: Self-pay

## 2014-07-08 DIAGNOSIS — Z79899 Other long term (current) drug therapy: Secondary | ICD-10-CM | POA: Diagnosis not present

## 2014-07-08 DIAGNOSIS — F419 Anxiety disorder, unspecified: Secondary | ICD-10-CM | POA: Insufficient documentation

## 2014-07-08 DIAGNOSIS — F329 Major depressive disorder, single episode, unspecified: Secondary | ICD-10-CM | POA: Diagnosis not present

## 2014-07-08 DIAGNOSIS — G8929 Other chronic pain: Secondary | ICD-10-CM | POA: Insufficient documentation

## 2014-07-08 DIAGNOSIS — Z8709 Personal history of other diseases of the respiratory system: Secondary | ICD-10-CM | POA: Insufficient documentation

## 2014-07-08 DIAGNOSIS — J45909 Unspecified asthma, uncomplicated: Secondary | ICD-10-CM | POA: Diagnosis not present

## 2014-07-08 DIAGNOSIS — Z720 Tobacco use: Secondary | ICD-10-CM | POA: Diagnosis not present

## 2014-07-08 DIAGNOSIS — Z8669 Personal history of other diseases of the nervous system and sense organs: Secondary | ICD-10-CM | POA: Insufficient documentation

## 2014-07-08 DIAGNOSIS — M545 Low back pain: Secondary | ICD-10-CM | POA: Insufficient documentation

## 2014-07-08 DIAGNOSIS — M549 Dorsalgia, unspecified: Secondary | ICD-10-CM

## 2014-07-08 MED ORDER — DIAZEPAM 5 MG PO TABS
5.0000 mg | ORAL_TABLET | Freq: Once | ORAL | Status: AC
  Administered 2014-07-08: 5 mg via ORAL
  Filled 2014-07-08: qty 1

## 2014-07-08 MED ORDER — DIAZEPAM 5 MG PO TABS: ORAL_TABLET | ORAL | Status: DC

## 2014-07-08 MED ORDER — DEXAMETHASONE 6 MG PO TABS: ORAL_TABLET | ORAL | Status: DC

## 2014-07-08 MED ORDER — OXYCODONE-ACETAMINOPHEN 5-325 MG PO TABS
2.0000 | ORAL_TABLET | Freq: Once | ORAL | Status: AC
  Administered 2014-07-08: 2 via ORAL
  Filled 2014-07-08: qty 2

## 2014-07-08 NOTE — Discharge Instructions (Signed)
A heating pad or warm tub soaks to your back maybe helpful. Please see your [facility] [place] [facility] physician for assistance with pain management until you can see your pain management physician in [place],  Chronic Back Pain  When back pain lasts longer than 3 months, it is called chronic back pain.People with chronic back pain often go through certain periods that are more intense (flare-ups).  CAUSES Chronic back pain can be caused by wear and tear (degeneration) on different structures in your back. These structures include:  The bones of your spine (vertebrae) and the joints surrounding your spinal cord and nerve roots (facets).  The strong, fibrous tissues that connect your vertebrae (ligaments). Degeneration of these structures may result in pressure on your nerves. This can lead to constant pain. HOME CARE INSTRUCTIONS  Avoid bending, heavy lifting, prolonged sitting, and activities which make the problem worse.  Take brief periods of rest throughout the day to reduce your pain. Lying down or standing usually is better than sitting while you are resting.  Take over-the-counter or prescription medicines only as directed by your caregiver. SEEK IMMEDIATE MEDICAL CARE IF:   You have weakness or numbness in one of your legs or feet.  You have trouble controlling your bladder or bowels.  You have nausea, vomiting, abdominal pain, shortness of breath, or fainting. Document Released: 09/23/2004 Document Revised: 11/08/2011 Document Reviewed: 07/31/2011 [facility] Patient Information 2015 [place] [place]. This information is not intended to replace advice given to you by your health care provider. Make sure you discuss any questions you have with your health care provider.  [place]

## 2014-07-08 NOTE — ED Notes (Signed)
Having pain all over, swelling in lower extremities per pt. Was supposed to have seen the MD today and did not have a ride. Rescheduled for December. Out of meds since last week.
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
## 2014-07-09 ENCOUNTER — Emergency Department (HOSPITAL_COMMUNITY)
Admission: EM | Admit: 2014-07-09 | Discharge: 2014-07-09 | Disposition: A | Discharge status: Home / Self Care | Payer: Medicaid Other | Attending: Emergency Medicine | Admitting: Emergency Medicine

## 2014-07-09 ENCOUNTER — Encounter (HOSPITAL_COMMUNITY): Payer: Self-pay

## 2014-07-09 DIAGNOSIS — Z8669 Personal history of other diseases of the nervous system and sense organs: Secondary | ICD-10-CM | POA: Diagnosis not present

## 2014-07-09 DIAGNOSIS — Z5900 Homelessness unspecified: Secondary | ICD-10-CM

## 2014-07-09 DIAGNOSIS — M549 Dorsalgia, unspecified: Secondary | ICD-10-CM | POA: Diagnosis present

## 2014-07-09 DIAGNOSIS — F329 Major depressive disorder, single episode, unspecified: Secondary | ICD-10-CM | POA: Insufficient documentation

## 2014-07-09 DIAGNOSIS — Z79899 Other long term (current) drug therapy: Secondary | ICD-10-CM | POA: Diagnosis not present

## 2014-07-09 DIAGNOSIS — Z87828 Personal history of other (healed) physical injury and trauma: Secondary | ICD-10-CM | POA: Diagnosis not present

## 2014-07-09 DIAGNOSIS — F419 Anxiety disorder, unspecified: Secondary | ICD-10-CM | POA: Diagnosis not present

## 2014-07-09 DIAGNOSIS — Z720 Tobacco use: Secondary | ICD-10-CM | POA: Insufficient documentation

## 2014-07-09 DIAGNOSIS — Z7952 Long term (current) use of systemic steroids: Secondary | ICD-10-CM | POA: Diagnosis not present

## 2014-07-09 DIAGNOSIS — J45909 Unspecified asthma, uncomplicated: Secondary | ICD-10-CM | POA: Diagnosis not present
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
## 2014-07-09 NOTE — ED Notes (Signed)
Social worker here to see pt.  Given resource numbers for homeless shelters.  Offered to help with transportation.

## 2014-07-09 NOTE — Discharge Instructions (Signed)
Follow-up per recommendations of social work.
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
## 2014-07-09 NOTE — ED Notes (Signed)
Gave patient a meal and drink as per nurse and doctor.
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
## 2014-08-25 ENCOUNTER — Emergency Department (HOSPITAL_COMMUNITY)
Admission: EM | Admit: 2014-08-25 | Discharge: 2014-08-25 | Disposition: A | Discharge status: Home / Self Care | Payer: Medicaid Other | Attending: Emergency Medicine | Admitting: Emergency Medicine

## 2014-08-25 ENCOUNTER — Encounter (HOSPITAL_COMMUNITY): Payer: Self-pay | Admitting: Emergency Medicine

## 2014-08-25 DIAGNOSIS — R51 Headache: Secondary | ICD-10-CM | POA: Insufficient documentation

## 2014-08-25 DIAGNOSIS — Z79899 Other long term (current) drug therapy: Secondary | ICD-10-CM | POA: Insufficient documentation

## 2014-08-25 DIAGNOSIS — F209 Schizophrenia, unspecified: Secondary | ICD-10-CM | POA: Diagnosis not present

## 2014-08-25 DIAGNOSIS — Z8669 Personal history of other diseases of the nervous system and sense organs: Secondary | ICD-10-CM | POA: Insufficient documentation

## 2014-08-25 DIAGNOSIS — R509 Fever, unspecified: Secondary | ICD-10-CM | POA: Insufficient documentation

## 2014-08-25 DIAGNOSIS — Z8709 Personal history of other diseases of the respiratory system: Secondary | ICD-10-CM | POA: Diagnosis not present

## 2014-08-25 DIAGNOSIS — F419 Anxiety disorder, unspecified: Secondary | ICD-10-CM | POA: Diagnosis not present

## 2014-08-25 DIAGNOSIS — J45909 Unspecified asthma, uncomplicated: Secondary | ICD-10-CM | POA: Insufficient documentation

## 2014-08-25 DIAGNOSIS — F329 Major depressive disorder, single episode, unspecified: Secondary | ICD-10-CM | POA: Insufficient documentation

## 2014-08-25 DIAGNOSIS — Z791 Long term (current) use of non-steroidal anti-inflammatories (NSAID): Secondary | ICD-10-CM | POA: Insufficient documentation

## 2014-08-25 DIAGNOSIS — Z87828 Personal history of other (healed) physical injury and trauma: Secondary | ICD-10-CM | POA: Insufficient documentation

## 2014-08-25 DIAGNOSIS — K088 Other specified disorders of teeth and supporting structures: Secondary | ICD-10-CM | POA: Diagnosis present

## 2014-08-25 DIAGNOSIS — K0889 Other specified disorders of teeth and supporting structures: Secondary | ICD-10-CM

## 2014-08-25 DIAGNOSIS — Z720 Tobacco use: Secondary | ICD-10-CM | POA: Insufficient documentation

## 2014-08-25 MED ORDER — AMOXICILLIN 500 MG PO CAPS: 500.0000 mg | ORAL_CAPSULE | Freq: Three times a day (TID) | ORAL | Status: DC

## 2014-08-25 MED ORDER — TRAMADOL HCL 50 MG PO TABS
50.0000 mg | ORAL_TABLET | Freq: Once | ORAL | Status: AC
Start: 2014-08-25 — End: 2014-08-25
  Administered 2014-08-25: 50 mg via ORAL
  Filled 2014-08-25: qty 1

## 2014-08-25 MED ORDER — AMOXICILLIN 250 MG PO CAPS
500.0000 mg | ORAL_CAPSULE | Freq: Once | ORAL | Status: AC
  Administered 2014-08-25: 500 mg via ORAL
  Filled 2014-08-25: qty 2

## 2014-08-25 MED ORDER — ACETAMINOPHEN-CODEINE #3 300-30 MG PO TABS: 1.0000 | ORAL_TABLET | Freq: Four times a day (QID) | ORAL | Status: DC | PRN

## 2014-08-25 MED ORDER — ACETAMINOPHEN-CODEINE #3 300-30 MG PO TABS
2.0000 | ORAL_TABLET | Freq: Once | ORAL | Status: DC
  Filled 2014-08-25: qty 2

## 2014-08-25 MED ORDER — ONDANSETRON HCL 4 MG PO TABS
4.0000 mg | ORAL_TABLET | Freq: Once | ORAL | Status: AC
  Administered 2014-08-25: 4 mg via ORAL
  Filled 2014-08-25: qty 1

## 2014-08-25 MED ORDER — TRAMADOL HCL 50 MG PO TABS: 50.0000 mg | ORAL_TABLET | Freq: Four times a day (QID) | ORAL | Status: DC | PRN
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
## 2014-08-25 NOTE — Discharge Instructions (Signed)
Dental Pain  Toothache is pain in or around a tooth. It may get worse with chewing or with cold or heat.   HOME CARE  · Your dentist may use a numbing medicine during treatment. If so, you may need to avoid eating until the medicine wears off. Ask your dentist about this.  · Only take medicine as told by your dentist or doctor.  · Avoid chewing food near the painful tooth until after all treatment is done. Ask your dentist about this.  GET HELP RIGHT AWAY IF:   · The problem gets worse or new problems appear.  · You have a fever.  · There is redness and puffiness (swelling) of the face, jaw, or neck.  · You cannot open your mouth.  · There is pain in the jaw.  · There is very bad pain that is not helped by medicine.  MAKE SURE YOU:   · Understand these instructions.  · Will watch your condition.  · Will get help right away if you are not doing well or get worse.  Document Released: 02/02/2008 Document Revised: 11/08/2011 Document Reviewed: 02/02/2008  [employer]® Patient Information ©2015 [employer]. This information is not intended to replace advice given to you by your health care provider. Make sure you discuss any questions you have with your health care provider.

## 2014-08-25 NOTE — ED Notes (Signed)
PT c/o left upper dental pain x3 days. PT stated he took 800mg  of Ibuprofen with no relief.
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
## 2014-12-20 NOTE — Consult Note (Signed)
Brief Consult Note: Diagnosis: psychosis due to substance abuse.   Patient was seen by consultant.   Consult note dictated.   Orders entered.   Discussed with Attending MD.   Comments: Psychiatry: Patient seen. Psychosis and substance abuse. Still confused and agitated. ADmit to inpatient psychiatry.  Electronic Signatures: Audery Amellapacs, Verneice Caspers T (MD)  (Signed 22-Oct-14 16:27)  Authored: Brief Consult Note   Last Updated: 22-Oct-14 16:27 by Audery Amellapacs, Malorie Bigford T (MD)
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
## 2014-12-27 ENCOUNTER — Emergency Department (HOSPITAL_COMMUNITY)
Admission: EM | Admit: 2014-12-27 | Discharge: 2014-12-27 | Disposition: A | Discharge status: Home / Self Care | Payer: Medicaid Other | Attending: Emergency Medicine | Admitting: Emergency Medicine

## 2014-12-27 ENCOUNTER — Emergency Department (HOSPITAL_COMMUNITY): Payer: Medicaid Other

## 2014-12-27 ENCOUNTER — Encounter (HOSPITAL_COMMUNITY): Payer: Self-pay | Admitting: *Deleted

## 2014-12-27 DIAGNOSIS — F329 Major depressive disorder, single episode, unspecified: Secondary | ICD-10-CM | POA: Insufficient documentation

## 2014-12-27 DIAGNOSIS — Y93H2 Activity, gardening and landscaping: Secondary | ICD-10-CM | POA: Insufficient documentation

## 2014-12-27 DIAGNOSIS — G629 Polyneuropathy, unspecified: Secondary | ICD-10-CM | POA: Insufficient documentation

## 2014-12-27 DIAGNOSIS — S4992XA Unspecified injury of left shoulder and upper arm, initial encounter: Secondary | ICD-10-CM | POA: Diagnosis not present

## 2014-12-27 DIAGNOSIS — S4991XA Unspecified injury of right shoulder and upper arm, initial encounter: Secondary | ICD-10-CM | POA: Diagnosis not present

## 2014-12-27 DIAGNOSIS — Z720 Tobacco use: Secondary | ICD-10-CM | POA: Insufficient documentation

## 2014-12-27 DIAGNOSIS — X58XXXA Exposure to other specified factors, initial encounter: Secondary | ICD-10-CM | POA: Diagnosis not present

## 2014-12-27 DIAGNOSIS — J45909 Unspecified asthma, uncomplicated: Secondary | ICD-10-CM | POA: Diagnosis not present

## 2014-12-27 DIAGNOSIS — S199XXA Unspecified injury of neck, initial encounter: Secondary | ICD-10-CM | POA: Diagnosis present

## 2014-12-27 DIAGNOSIS — Z792 Long term (current) use of antibiotics: Secondary | ICD-10-CM | POA: Insufficient documentation

## 2014-12-27 DIAGNOSIS — Y998 Other external cause status: Secondary | ICD-10-CM | POA: Diagnosis not present

## 2014-12-27 DIAGNOSIS — Y92007 Garden or yard of unspecified non-institutional (private) residence as the place of occurrence of the external cause: Secondary | ICD-10-CM | POA: Insufficient documentation

## 2014-12-27 DIAGNOSIS — S161XXA Strain of muscle, fascia and tendon at neck level, initial encounter: Secondary | ICD-10-CM | POA: Insufficient documentation

## 2014-12-27 DIAGNOSIS — F419 Anxiety disorder, unspecified: Secondary | ICD-10-CM | POA: Diagnosis not present

## 2014-12-27 DIAGNOSIS — Z79899 Other long term (current) drug therapy: Secondary | ICD-10-CM | POA: Diagnosis not present

## 2014-12-27 MED ORDER — CYCLOBENZAPRINE HCL 10 MG PO TABS: 10.0000 mg | ORAL_TABLET | Freq: Three times a day (TID) | ORAL | Status: DC | PRN

## 2014-12-27 MED ORDER — OXYCODONE HCL 5 MG PO TABS
10.0000 mg | ORAL_TABLET | Freq: Once | ORAL | Status: AC
  Administered 2014-12-27: 10 mg via ORAL
  Filled 2014-12-27: qty 2
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
## 2014-12-27 NOTE — Discharge Instructions (Signed)
Cervical Sprain A cervical sprain is when the tissues (ligaments) that hold the neck bones in place stretch or tear. HOME CARE   Put ice on the injured area.  Put ice in a plastic bag.  Place a towel between your skin and the bag.  Leave the ice on for 15-20 minutes, 3-4 times a day.  You may have been given a collar to wear. This collar keeps your neck from moving while you heal.  Do not take the collar off unless told by your doctor.  If you have long hair, keep it outside of the collar.  Ask your doctor before changing the position of your collar. You may need to change its position over time to make it more comfortable.  If you are allowed to take off the collar for cleaning or bathing, follow your doctor's instructions on how to do it safely.  Keep your collar clean by wiping it with mild soap and water. Dry it completely. If the collar has removable pads, remove them every 1-2 days to hand wash them with soap and water. Allow them to air dry. They should be dry before you wear them in the collar.  Do not drive while wearing the collar.  Only take medicine as told by your doctor.  Keep all doctor visits as told.  Keep all physical therapy visits as told.  Adjust your work station so that you have good posture while you work.  Avoid positions and activities that make your problems worse.  Warm up and stretch before being active. GET HELP IF:  Your pain is not controlled with medicine.  You cannot take less pain medicine over time as planned.  Your activity level does not improve as expected. GET HELP RIGHT AWAY IF:   You are bleeding.  Your stomach is upset.  You have an allergic reaction to your medicine.  You develop new problems that you cannot explain.  You lose feeling (become numb) or you cannot move any part of your body (paralysis).  You have tingling or weakness in any part of your body.  Your symptoms get worse. Symptoms include:  Pain,  soreness, stiffness, puffiness (swelling), or a burning feeling in your neck.  Pain when your neck is touched.  Shoulder or upper back pain.  Limited ability to move your neck.  Headache.  Dizziness.  Your hands or arms feel week, lose feeling, or tingle.  Muscle spasms.  Difficulty swallowing or chewing. MAKE SURE YOU:   Understand these instructions.  Will watch your condition.  Will get help right away if you are not doing well or get worse. Document Released: 02/02/2008 Document Revised: 04/18/2013 Document Reviewed: 02/21/2013 [employer] Patient Information 2015 [employer]. This information is not intended to replace advice given to you by your health care provider. Make sure you discuss any questions you have with your health care provider.  
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
## 2014-12-29 ENCOUNTER — Encounter (HOSPITAL_COMMUNITY): Payer: Self-pay | Admitting: Emergency Medicine

## 2014-12-29 ENCOUNTER — Emergency Department (HOSPITAL_COMMUNITY)
Admission: EM | Admit: 2014-12-29 | Discharge: 2014-12-29 | Disposition: A | Discharge status: Home / Self Care | Payer: Medicaid Other | Attending: Emergency Medicine | Admitting: Emergency Medicine

## 2014-12-29 DIAGNOSIS — Z79899 Other long term (current) drug therapy: Secondary | ICD-10-CM | POA: Diagnosis not present

## 2014-12-29 DIAGNOSIS — S161XXD Strain of muscle, fascia and tendon at neck level, subsequent encounter: Secondary | ICD-10-CM

## 2014-12-29 DIAGNOSIS — F329 Major depressive disorder, single episode, unspecified: Secondary | ICD-10-CM | POA: Diagnosis not present

## 2014-12-29 DIAGNOSIS — J45909 Unspecified asthma, uncomplicated: Secondary | ICD-10-CM | POA: Diagnosis not present

## 2014-12-29 DIAGNOSIS — M62838 Other muscle spasm: Secondary | ICD-10-CM | POA: Diagnosis not present

## 2014-12-29 DIAGNOSIS — M546 Pain in thoracic spine: Secondary | ICD-10-CM | POA: Diagnosis not present

## 2014-12-29 DIAGNOSIS — F419 Anxiety disorder, unspecified: Secondary | ICD-10-CM | POA: Insufficient documentation

## 2014-12-29 DIAGNOSIS — H538 Other visual disturbances: Secondary | ICD-10-CM | POA: Diagnosis not present

## 2014-12-29 DIAGNOSIS — Z87828 Personal history of other (healed) physical injury and trauma: Secondary | ICD-10-CM | POA: Diagnosis not present

## 2014-12-29 DIAGNOSIS — Z720 Tobacco use: Secondary | ICD-10-CM | POA: Diagnosis not present

## 2014-12-29 DIAGNOSIS — G8929 Other chronic pain: Secondary | ICD-10-CM | POA: Insufficient documentation

## 2014-12-29 DIAGNOSIS — X58XXXD Exposure to other specified factors, subsequent encounter: Secondary | ICD-10-CM | POA: Diagnosis not present

## 2014-12-29 DIAGNOSIS — M542 Cervicalgia: Secondary | ICD-10-CM | POA: Diagnosis present

## 2014-12-29 HISTORY — DX: Dorsalgia, unspecified: M54.9

## 2014-12-29 HISTORY — DX: Pain, unspecified: R52

## 2014-12-29 HISTORY — DX: Cervicalgia: M54.2

## 2014-12-29 HISTORY — DX: Other chronic pain: G89.29

## 2014-12-29 HISTORY — DX: Pain in unspecified knee: M25.569

## 2014-12-29 MED ORDER — OXYCODONE HCL 5 MG PO TABS
15.0000 mg | ORAL_TABLET | Freq: Once | ORAL | Status: AC
  Administered 2014-12-29: 15 mg via ORAL
  Filled 2014-12-29: qty 3
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
## 2014-12-29 NOTE — Discharge Instructions (Signed)
Muscle Cramps and Spasms Muscle cramps and spasms are when muscles tighten by themselves. They usually get better within minutes. Muscle cramps are painful. They are usually stronger and last longer than muscle spasms. Muscle spasms may or may not be painful. They can last a few seconds or much longer. HOME CARE  Drink enough fluid to keep your pee (urine) clear or pale yellow.  Massage, stretch, and relax the muscle.  Use a warm towel, heating pad, or warm shower water on tight muscles.  Place ice on the muscle if it is tender or in pain.  Put ice in a plastic bag.  Place a towel between your skin and the bag.  Leave the ice on for 15-20 minutes, 03-04 times a day.  Only take medicine as told by your doctor. GET HELP RIGHT AWAY IF:  Your cramps or spasms get worse, happen more often, or do not get better with time. MAKE SURE YOU:  Understand these instructions.  Will watch your condition.  Will get help right away if you are not doing well or get worse. Document Released: 07/29/2008 Document Revised: 12/11/2012 Document Reviewed: 08/02/2012 ExitCare Patient Information 2015 [employer]. This information is not intended to replace advice given to you by your health care provider. Make sure you discuss any questions you have with your health care provider.  

## 2014-12-29 NOTE — ED Notes (Signed)
Patient reports he woke up 3 days ago with pains to back of neck. States ongoing pain that has been persistent. Reports he ran out of his pain medication early. States is supposed to see PCP tomorrow.

## 2015-01-02 ENCOUNTER — Encounter (HOSPITAL_COMMUNITY): Payer: Self-pay | Admitting: Emergency Medicine

## 2015-01-02 ENCOUNTER — Emergency Department (HOSPITAL_COMMUNITY)
Admission: EM | Admit: 2015-01-02 | Discharge: 2015-01-02 | Disposition: A | Discharge status: Home / Self Care | Payer: Medicaid Other | Attending: Emergency Medicine | Admitting: Emergency Medicine

## 2015-01-02 DIAGNOSIS — Z720 Tobacco use: Secondary | ICD-10-CM | POA: Insufficient documentation

## 2015-01-02 DIAGNOSIS — Z8669 Personal history of other diseases of the nervous system and sense organs: Secondary | ICD-10-CM | POA: Insufficient documentation

## 2015-01-02 DIAGNOSIS — J45909 Unspecified asthma, uncomplicated: Secondary | ICD-10-CM | POA: Diagnosis not present

## 2015-01-02 DIAGNOSIS — M542 Cervicalgia: Secondary | ICD-10-CM | POA: Diagnosis not present

## 2015-01-02 DIAGNOSIS — Z79899 Other long term (current) drug therapy: Secondary | ICD-10-CM | POA: Diagnosis not present

## 2015-01-02 DIAGNOSIS — F329 Major depressive disorder, single episode, unspecified: Secondary | ICD-10-CM | POA: Diagnosis not present

## 2015-01-02 DIAGNOSIS — G8929 Other chronic pain: Secondary | ICD-10-CM | POA: Diagnosis not present

## 2015-01-02 DIAGNOSIS — F209 Schizophrenia, unspecified: Secondary | ICD-10-CM | POA: Insufficient documentation

## 2015-01-02 DIAGNOSIS — F419 Anxiety disorder, unspecified: Secondary | ICD-10-CM | POA: Diagnosis not present
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
## 2015-01-02 NOTE — Discharge Instructions (Signed)
Call your doctor to discuss your chronic pain management. Use you Oxycodone that he gave you as directed.

## 2015-01-02 NOTE — ED Notes (Addendum)
Patient complaining of neck and back pain x 1 week. States he was given prescription for "5/325" but states they are not working. States his doctor normally gives him "20 mg roxy." Patient denies injury. States "my doctor says I can't come back to him until next week."

## 2015-01-12 ENCOUNTER — Encounter (HOSPITAL_COMMUNITY): Payer: Self-pay | Admitting: Emergency Medicine

## 2015-01-12 ENCOUNTER — Emergency Department (HOSPITAL_COMMUNITY)
Admission: EM | Admit: 2015-01-12 | Discharge: 2015-01-12 | Disposition: A | Discharge status: Home / Self Care | Payer: Medicaid Other | Attending: Emergency Medicine | Admitting: Emergency Medicine

## 2015-01-12 DIAGNOSIS — G8929 Other chronic pain: Secondary | ICD-10-CM | POA: Diagnosis not present

## 2015-01-12 DIAGNOSIS — F329 Major depressive disorder, single episode, unspecified: Secondary | ICD-10-CM | POA: Diagnosis not present

## 2015-01-12 DIAGNOSIS — B356 Tinea cruris: Secondary | ICD-10-CM | POA: Insufficient documentation

## 2015-01-12 DIAGNOSIS — F419 Anxiety disorder, unspecified: Secondary | ICD-10-CM | POA: Insufficient documentation

## 2015-01-12 DIAGNOSIS — Z720 Tobacco use: Secondary | ICD-10-CM | POA: Diagnosis not present

## 2015-01-12 DIAGNOSIS — Z79899 Other long term (current) drug therapy: Secondary | ICD-10-CM | POA: Insufficient documentation

## 2015-01-12 DIAGNOSIS — Z87828 Personal history of other (healed) physical injury and trauma: Secondary | ICD-10-CM | POA: Insufficient documentation

## 2015-01-12 DIAGNOSIS — J45909 Unspecified asthma, uncomplicated: Secondary | ICD-10-CM | POA: Insufficient documentation

## 2015-01-12 MED ORDER — CLOTRIMAZOLE 1 % EX CREA: TOPICAL_CREAM | CUTANEOUS | Status: DC

## 2015-01-12 MED ORDER — ALPRAZOLAM 0.5 MG PO TABS
0.5000 mg | ORAL_TABLET | Freq: Once | ORAL | Status: AC
  Administered 2015-01-12: 0.5 mg via ORAL
  Filled 2015-01-12: qty 1

## 2015-01-12 NOTE — Discharge Instructions (Signed)
Jock Itch Jock itch is a germ infection of the groin and upper thighs. The type of germ that causes jock itch is a fungus. It is itchy and often feels like it is burning. It is common in people who play sports. Sweating and wearing certain athletic gear can cause this type of rash. HOME CARE  Take your medicines as told. Finish them even if you start to feel better.  Wear loose-fitting clothing.  Men should wear cotton boxer shorts.  Women should wear cotton underwear.  Avoid hot baths.  Dry the groin area well after bathing. GET HELP RIGHT AWAY IF:   Your rash is worse or spreading.  Your rash returns after treatment is finished.  Your rash is not gone in 4 weeks.  The area becomes red, warm, tender, and puffy (swollen).  You have a fever. MAKE SURE YOU:  Understand these instructions.  Will watch your condition.  Will get help right away if you are not doing well or get worse. Document Released: 11/10/2009 Document Revised: 11/08/2011 Document Reviewed: 11/10/2009 [facility] Patient Information 2015 [place]. This information is not intended to replace advice given to you by your health care provider. Make sure you discuss any questions you have with your health care provider.   Use the cream prescribed.  Get rechecked if your symptoms are not improving over the next 7 days of treatment.  If it is improving, continue to use the cream until the symptoms are completely gone.
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
## 2015-01-12 NOTE — ED Notes (Signed)
Patient reports rash to groin x 2 days that is causing pain and is itching. Patient also reports it is causing him to have panic attacks. Patient reports he is having bad anxiety.

## 2015-01-17 ENCOUNTER — Encounter (HOSPITAL_COMMUNITY): Payer: Self-pay | Admitting: General Practice

## 2015-01-17 ENCOUNTER — Encounter (HOSPITAL_COMMUNITY): Payer: Self-pay | Admitting: Emergency Medicine

## 2015-01-17 ENCOUNTER — Inpatient Hospital Stay (HOSPITAL_COMMUNITY)
Admission: AD | Admit: 2015-01-17 | Discharge: 2015-01-23 | DRG: 882 | Disposition: A | Discharge status: Home / Self Care | Payer: Medicaid Other | Source: Intra-hospital | Attending: Psychiatry | Admitting: Psychiatry

## 2015-01-17 ENCOUNTER — Emergency Department (HOSPITAL_COMMUNITY)
Admission: EM | Admit: 2015-01-17 | Discharge: 2015-01-17 | Disposition: A | Discharge status: Other Acute Inpatient Hospital | Payer: Medicaid Other | Attending: Emergency Medicine | Admitting: Emergency Medicine

## 2015-01-17 DIAGNOSIS — G629 Polyneuropathy, unspecified: Secondary | ICD-10-CM | POA: Diagnosis not present

## 2015-01-17 DIAGNOSIS — F1110 Opioid abuse, uncomplicated: Secondary | ICD-10-CM | POA: Diagnosis present

## 2015-01-17 DIAGNOSIS — F200 Paranoid schizophrenia: Secondary | ICD-10-CM

## 2015-01-17 DIAGNOSIS — F209 Schizophrenia, unspecified: Secondary | ICD-10-CM | POA: Diagnosis present

## 2015-01-17 DIAGNOSIS — F419 Anxiety disorder, unspecified: Secondary | ICD-10-CM | POA: Insufficient documentation

## 2015-01-17 DIAGNOSIS — G8929 Other chronic pain: Secondary | ICD-10-CM | POA: Insufficient documentation

## 2015-01-17 DIAGNOSIS — G4700 Insomnia, unspecified: Secondary | ICD-10-CM | POA: Diagnosis not present

## 2015-01-17 DIAGNOSIS — F410 Panic disorder [episodic paroxysmal anxiety] without agoraphobia: Secondary | ICD-10-CM | POA: Diagnosis not present

## 2015-01-17 DIAGNOSIS — Z720 Tobacco use: Secondary | ICD-10-CM | POA: Insufficient documentation

## 2015-01-17 DIAGNOSIS — J45909 Unspecified asthma, uncomplicated: Secondary | ICD-10-CM | POA: Diagnosis not present

## 2015-01-17 DIAGNOSIS — F17210 Nicotine dependence, cigarettes, uncomplicated: Secondary | ICD-10-CM | POA: Diagnosis present

## 2015-01-17 DIAGNOSIS — F1599 Other stimulant use, unspecified with unspecified stimulant-induced disorder: Secondary | ICD-10-CM | POA: Diagnosis not present

## 2015-01-17 DIAGNOSIS — F1410 Cocaine abuse, uncomplicated: Secondary | ICD-10-CM

## 2015-01-17 DIAGNOSIS — F4310 Post-traumatic stress disorder, unspecified: Principal | ICD-10-CM | POA: Diagnosis present

## 2015-01-17 DIAGNOSIS — F329 Major depressive disorder, single episode, unspecified: Secondary | ICD-10-CM | POA: Diagnosis not present

## 2015-01-17 DIAGNOSIS — Z79899 Other long term (current) drug therapy: Secondary | ICD-10-CM | POA: Insufficient documentation

## 2015-01-17 DIAGNOSIS — Z008 Encounter for other general examination: Secondary | ICD-10-CM | POA: Diagnosis present

## 2015-01-17 DIAGNOSIS — F1310 Sedative, hypnotic or anxiolytic abuse, uncomplicated: Secondary | ICD-10-CM | POA: Diagnosis not present

## 2015-01-17 DIAGNOSIS — R45851 Suicidal ideations: Secondary | ICD-10-CM | POA: Diagnosis present

## 2015-01-17 DIAGNOSIS — F1590 Other stimulant use, unspecified, uncomplicated: Secondary | ICD-10-CM | POA: Diagnosis present

## 2015-01-17 DIAGNOSIS — F1190 Opioid use, unspecified, uncomplicated: Secondary | ICD-10-CM | POA: Diagnosis not present

## 2015-01-17 DIAGNOSIS — S24144A Brown-Sequard syndrome at T11-T12 level of thoracic spinal cord, initial encounter: Secondary | ICD-10-CM | POA: Diagnosis present

## 2015-01-17 DIAGNOSIS — R768 Other specified abnormal immunological findings in serum: Secondary | ICD-10-CM | POA: Clinically undetermined

## 2015-01-17 LAB — CBC
HCT: 41 % (ref 39.0–52.0)
HEMOGLOBIN: 13.6 g/dL (ref 13.0–17.0)
MCH: 25.6 pg — AB (ref 26.0–34.0)
MCHC: 33.2 g/dL (ref 30.0–36.0)
MCV: 77.1 fL — AB (ref 78.0–100.0)
Platelets: 144 10*3/uL — ABNORMAL LOW (ref 150–400)
RBC: 5.32 MIL/uL (ref 4.22–5.81)
RDW: 13.6 % (ref 11.5–15.5)
WBC: 5.5 10*3/uL (ref 4.0–10.5)

## 2015-01-17 LAB — COMPREHENSIVE METABOLIC PANEL
ALT: 99 U/L — AB (ref 17–63)
AST: 41 U/L (ref 15–41)
Albumin: 4.1 g/dL (ref 3.5–5.0)
Alkaline Phosphatase: 48 U/L (ref 38–126)
Anion gap: 8 (ref 5–15)
BILIRUBIN TOTAL: 0.6 mg/dL (ref 0.3–1.2)
BUN: 9 mg/dL (ref 6–20)
CHLORIDE: 103 mmol/L (ref 101–111)
CO2: 29 mmol/L (ref 22–32)
Calcium: 8.6 mg/dL — ABNORMAL LOW (ref 8.9–10.3)
Creatinine, Ser: 1.03 mg/dL (ref 0.61–1.24)
GFR calc Af Amer: >60 mL/min (ref >60)
GFR calc non Af Amer: >60 mL/min (ref >60)
Glucose, Bld: 97 mg/dL (ref 65–99)
Potassium: 3.1 mmol/L — ABNORMAL LOW (ref 3.5–5.1)
Sodium: 140 mmol/L (ref 135–145)
Total Protein: 7.3 g/dL (ref 6.5–8.1)

## 2015-01-17 LAB — ACETAMINOPHEN LEVEL: Acetaminophen (Tylenol), Serum: <10 ug/mL — ABNORMAL LOW (ref 10–30)

## 2015-01-17 LAB — URINALYSIS, ROUTINE W REFLEX MICROSCOPIC
Bilirubin Urine: NEGATIVE
Glucose, UA: NEGATIVE mg/dL
Hgb urine dipstick: NEGATIVE
KETONES UR: NEGATIVE mg/dL
Leukocytes, UA: NEGATIVE
NITRITE: NEGATIVE
Protein, ur: NEGATIVE mg/dL
SPECIFIC GRAVITY, URINE: 1.015 (ref 1.005–1.030)
Urobilinogen, UA: 1 mg/dL (ref 0.0–1.0)
pH: 6 (ref 5.0–8.0)

## 2015-01-17 LAB — RAPID URINE DRUG SCREEN, HOSP PERFORMED
AMPHETAMINES: NOT DETECTED
BARBITURATES: NOT DETECTED
Benzodiazepines: POSITIVE — AB
COCAINE: POSITIVE — AB
Opiates: NOT DETECTED
Tetrahydrocannabinol: NOT DETECTED

## 2015-01-17 LAB — ETHANOL

## 2015-01-17 LAB — SALICYLATE LEVEL

## 2015-01-17 MED ORDER — NICOTINE POLACRILEX 2 MG MT GUM
2.0000 mg | CHEWING_GUM | OROMUCOSAL | Status: DC | PRN
  Administered 2015-01-17 – 2015-01-23 (×8): 2 mg via ORAL
  Filled 2015-01-17 (×9): qty 1

## 2015-01-17 MED ORDER — ALPRAZOLAM 0.5 MG PO TABS
0.5000 mg | ORAL_TABLET | Freq: Two times a day (BID) | ORAL | Status: DC | PRN
  Administered 2015-01-17 – 2015-01-20 (×5): 0.5 mg via ORAL
  Filled 2015-01-17 (×6): qty 1

## 2015-01-17 MED ORDER — MAGNESIUM HYDROXIDE 400 MG/5ML PO SUSP
30.0000 mL | Freq: Every day | ORAL | Status: DC | PRN
  Administered 2015-01-19: 30 mL via ORAL
  Filled 2015-01-17: qty 30

## 2015-01-17 MED ORDER — OXYCODONE HCL 5 MG PO TABS
5.0000 mg | ORAL_TABLET | Freq: Three times a day (TID) | ORAL | Status: DC | PRN
  Administered 2015-01-17 – 2015-01-21 (×11): 5 mg via ORAL
  Filled 2015-01-17 (×11): qty 1

## 2015-01-17 MED ORDER — TRAZODONE HCL 50 MG PO TABS
50.0000 mg | ORAL_TABLET | Freq: Every evening | ORAL | Status: DC | PRN
  Administered 2015-01-17 – 2015-01-20 (×3): 50 mg via ORAL
  Filled 2015-01-17 (×3): qty 1

## 2015-01-17 MED ORDER — POTASSIUM CHLORIDE CRYS ER 20 MEQ PO TBCR
20.0000 meq | EXTENDED_RELEASE_TABLET | Freq: Once | ORAL | Status: AC
  Administered 2015-01-17: 20 meq via ORAL
  Filled 2015-01-17: qty 1

## 2015-01-17 MED ORDER — ALUM & MAG HYDROXIDE-SIMETH 200-200-20 MG/5ML PO SUSP: 30.0000 mL | ORAL | Status: DC | PRN

## 2015-01-17 MED ORDER — DIPHENHYDRAMINE-ZINC ACETATE 2-0.1 % EX CREA
TOPICAL_CREAM | Freq: Three times a day (TID) | CUTANEOUS | Status: DC | PRN
  Administered 2015-01-17: 1 via TOPICAL
  Administered 2015-01-18: 20:00:00 via TOPICAL
  Administered 2015-01-20 (×2): 1 via TOPICAL
  Administered 2015-01-22: 21:00:00 via TOPICAL
  Administered 2015-01-22: 1 via TOPICAL
  Filled 2015-01-17: qty 28

## 2015-01-17 NOTE — ED Notes (Signed)
Attempted report to nurse.  Asked pt x2 to give urine sample and pt reports "i dont have to go right now let me drink something."
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
## 2015-01-17 NOTE — ED Notes (Signed)
Telepsych in progress. 
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
## 2015-01-17 NOTE — ED Notes (Signed)
Patient reports suicidal ideation. Patient reports, "I just want to get away. I am depressed and have a lot going on. My grandma is in the hospital." Patient also reports anxiety and chronic pain, and states he hasn't ate in two days because I'm stressed out.

## 2015-01-17 NOTE — Tx Team (Signed)
Initial Interdisciplinary Treatment Plan   PATIENT STRESSORS: Medication change or noncompliance Substance abuse   PATIENT STRENGTHS: Communication skills General fund of knowledge   PROBLEM LIST: Problem List/Patient Goals Date to be addressed Date deferred Reason deferred Estimated date of resolution  Suicidal Ideation 01/17/2015           Depression 01/17/2015           "Manage my pain" 01/17/2015           "Working on a goal" 01/17/2015                  DISCHARGE CRITERIA:  Improved stabilization in mood, thinking, and/or behavior Verbal commitment to aftercare and medication compliance  PRELIMINARY DISCHARGE PLAN: Attend PHP/IOP Outpatient therapy  PATIENT/FAMIILY INVOLVEMENT: This treatment plan has been presented to and reviewed with the patient, Cole [name].  The patient and family have been given the opportunity to ask questions and make suggestions.  Marzetta BoardDopson, [name] E 01/17/2015, 3:57 PM

## 2015-01-17 NOTE — Progress Notes (Signed)
[facility] Accepted by Claudette Headonrad Withrow, DNP. Admission is voluntary. Can be transported when ready.  Spoke with [facility] RN regarding pt's disposition.   Ilean SkillMeghan Eyanna Mcgonagle, MSW, LCSWA Clinical Social Work, Disposition  01/17/2015 [phone]
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
## 2015-01-18 DIAGNOSIS — F1410 Cocaine abuse, uncomplicated: Secondary | ICD-10-CM

## 2015-01-18 DIAGNOSIS — F200 Paranoid schizophrenia: Secondary | ICD-10-CM

## 2015-01-18 MED ORDER — OLANZAPINE 5 MG PO TABS
5.0000 mg | ORAL_TABLET | Freq: Every day | ORAL | Status: DC
  Administered 2015-01-18 – 2015-01-19 (×2): 5 mg via ORAL
  Filled 2015-01-18 (×3): qty 1

## 2015-01-18 NOTE — Progress Notes (Signed)
Patient ID: Cole [name], male   DOB: 06/11/1981, 34 y.o.   MRN: [id number]   D: Pt has been very flat and depressed on the unit today. Pt reported that he was depressed because his grandmother was sick with kidney disease. Pt reported that he was also depressed because he had no money and no job. Pt reported on his self inventory sheet that his depression was a 6, his hopelessness was a 2, and that his anxiety was a 5. Pt reported he was having SI, and was able to contract for safety. Pt reported that his goal for today was to work on people skills and communicate with family. Pt reported being negative HI, no AH/VH noted. A: 15 min checks continued for patient safety. R: Pt safety maintained.

## 2015-01-18 NOTE — Plan of Care (Signed)
Problem: Alteration in mood & ability to function due to Goal: STG-Patient will attend groups Outcome: Progressing Patient attended wrap up group this evening.     
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
## 2015-01-18 NOTE — [facility] Group Notes (Signed)
[facility] Group Notes:  (Clinical Social Work)  01/18/2015  11:15-12:00PM  Summary of Progress/Problems:   The main focus of today's process group was to discuss patients' feelings related to being hospitalized, as well as the difference between "being" and "having" a mental health diagnosis.  It was agreed in general by the group that it would be preferable to avoid future hospitalizations, and we discussed means of doing that.  As a follow-up, problems with adhering to medication recommendations were discussed.  The patient expressed their primary feeling about being hospitalized is confusion, but he agreed with other patients that he feels disrespected by the whole process of going to the emergency room and being identified as a mental health patient.  Type of Therapy:  Group Therapy - Process  Participation Level:  Active  Participation Quality:  Attentive  Affect:  Anxious and Blunted  Cognitive:  Confused  Insight:  Improving  Engagement in Therapy:  Improving  Modes of Intervention:  Exploration, Discussion  Ambrose MantleMareida Grossman-Orr, LCSW 01/18/2015, 12:50 PM
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
## 2015-01-18 NOTE — [facility] Group Notes (Signed)
[facility] Group Notes:  (Nursing/MHT/Case Management/Adjunct)  Date:  01/18/2015  Time:  11:41 AM  Type of Therapy:  Psychoeducational Skills  Participation Level:  Active  Participation Quality:  Appropriate  Affect:  Appropriate  Cognitive:  Appropriate  Insight:  Appropriate  Engagement in Group:  Engaged  Modes of Intervention:  Discussion  Summary of Progress/Problems: Pt did attend self inventory group, pt reported that he was negative SI/HI, no AH/VH noted. Pt rated his depression as a 8, and his helplessness/hopelessness as a 4.     Pt reported concerns disability and homelessness, pt advised that the SW will be made aware.   Cole [name], Cole [name] 01/18/2015, 11:41 AM
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
## 2015-01-18 NOTE — Progress Notes (Signed)
Adult Psychoeducational Group Note  Date:  01/18/2015 Time:  8:55 PM  Group Topic/Focus:  Wrap-Up Group:   The focus of this group is to help patients review their daily goal of treatment and discuss progress on daily workbooks.  Participation Level:  Active  Participation Quality:  Appropriate  Affect:  Appropriate  Cognitive:  Appropriate  Insight: Appropriate  Engagement in Group:  Engaged  Modes of Intervention:  Discussion  Additional Comments: The patient expressed that he attended group today.The patient also said that in group they talked about positive goals.  Cole [name], Cole [name] 01/18/2015, 8:55 PM

## 2015-01-19 DIAGNOSIS — F200 Paranoid schizophrenia: Secondary | ICD-10-CM

## 2015-01-19 DIAGNOSIS — R45851 Suicidal ideations: Secondary | ICD-10-CM

## 2015-01-19 NOTE — Progress Notes (Deleted)
D:Pt reports that she is sleeping better in the hospital. Pt asked about her medications and the name of her medications. Pt is mildly blunted and engaged in a conversation. She denies any si or hi. She denies hallucinations and has not been observed responding to internal stimuli on this writers shift. A:Offered support, encouragement and 15 minute checks. Discussed medications with the pt. R:Safety maintained on the unit.

## 2015-01-19 NOTE — Progress Notes (Signed)
Adult Psychoeducational Group Note  Date:  01/19/2015 Time:  8:31 PM  Group Topic/Focus:  Wrap-Up Group:   The focus of this group is to help patients review their daily goal of treatment and discuss progress on daily workbooks.  Participation Level:  Active  Participation Quality:  Appropriate  Affect:  Appropriate  Cognitive:  Appropriate  Insight: Appropriate  Engagement in Group:  Engaged  Modes of Intervention:  Discussion  Additional Comments: The patient expressed that he attend the groups today.The patient also said that  The groups were about feelings.  Octavio Mannshigpen, Nasha Diss Lee 01/19/2015, 8:31 PM

## 2015-01-19 NOTE — [facility] Group Notes (Signed)
[facility] Group Notes:  (Nursing/MHT/Case Management/Adjunct)  Date:  01/19/2015  Time:  11:03 AM  Type of Therapy:  Psychoeducational Skills  Participation Level:  Active  Participation Quality:  Appropriate  Affect:  Appropriate  Cognitive:  Appropriate  Insight:  Appropriate  Engagement in Group:  Engaged  Modes of Intervention:  Discussion  Summary of Progress/Problems: Pt did attend self inventory group, pt reported that he was negative SI/HI, no AH/VH noted. Pt rated his depression as a 6, and his helplessness/hopelessness as a 0.     Pt reported no issues or concerns.   Jacquelyne BalintForrest, Mohamed Portlock Shanta 01/19/2015, 11:03 AM
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
## 2015-01-19 NOTE — Progress Notes (Signed)
Patient ID: Cole BreachJason M Lacaze, male   DOB: 02/18/1981, 34 y.o.   MRN: [id number]   D: Pt has been very flat and depressed on the unit today. Pt did report that he felt better than he did yesterday. Pt attended all groups and engaged in treatment, no issues or concerns noted. Pt continues to seek pain medication and anxiety medication. Pt reported being negative SI/HI, no AH/VH noted. A: 15 min checks continued for patient safety. R: Pt safety maintained.

## 2015-01-19 NOTE — [facility] Group Notes (Signed)
[facility] Group Notes:  (Clinical Social Work)  01/19/2015  [facility] Group Notes:  (Clinical Social Work)  01/19/2015  11:00AM-12:00PM  Summary of Progress/Problems:  The main focus of today's process group was to listen to a variety of genres of music and to identify that different types of music provoke different responses.  The patient then was able to identify personally what was soothing for them, as well as energizing.  Handouts were used to record feelings evoked, as well as how patient can personally use this knowledge in sleep habits, with depression, and with other symptoms.  The patient expressed understanding of concepts, as well as knowledge of how each type of music affected him/her and how this can be used at home as a wellness/recovery tool.  Type of Therapy:  Music Therapy   Participation Level:  Active  Participation Quality:  Attentive and Sharing  Affect:  Blunted  Cognitive:  Alert  Insight:  Improving  Engagement in Therapy:  Improving  Modes of Intervention:   Activity, Exploration  Cole MantleMareida Grossman-Orr, LCSW 01/19/2015

## 2015-01-19 NOTE — BHH Counselor (Signed)
Adult Comprehensive Assessment  Patient ID: Ernst BreachJason M Loughner, male   DOB: 01/26/1981, 34 y.o.   MRN: [id number]  Information Source: Information source: Patient  Current Stressors:  Educational / Learning stressors: Needs to go back to get his GED Employment / Job issues: Does not have a job or income.  Has been trying to get a job for 5 years, and only gets temporary work, has no transportation. Family Relationships: Loves his family but does not get along with them.  They always preach at him, and it makes him angry. Financial / Lack of resources (include bankruptcy): Limited income and child support - is behind $7000.  Is trying to get his disaiblity, states he needs to get a lawer. Housing / Lack of housing: Does not have his own place. Physical health (include injuries & life threatening diseases): Feet stay in pain, has a lot of headaches and back pain.  Especially his neck hurts. Social relationships: Talks to people on Facebook but they barely respond. Substance abuse: Takes drug tests and will be told he has drugs in his system, and he does not even remember doing the drug such as cocaine. Bereavement / Loss: Denies current stressors - grandmother has been sick, will be very stressful if she dies because he is living with her now.  Living/Environment/Situation:  Living Arrangements: Other relatives (Is living with grandmother and mother) Living conditions (as described by patient or guardian): Has his own room - the whole basement How long has patient lived in current situation?: 20 years What is atmosphere in current home: Comfortable, Supportive, Loving  Family History:  Marital status: Single Does patient have children?: Yes How many children?: 1 (11yo daughter) How is patient's relationship with their children?: Barely gets along with his daughter "because she is too grown."  Has not been around her much, so cannot tell her what to do.  Childhood History:  By whom was/is  the patient raised?: Mother, Mother/father and step-parent Additional childhood history information: Mother left father when pt was an infant. Description of patient's relationship with caregiver when they were a child: Pretty good, "pretty straight."  Stepfather was part of raising him for awhile, was a good influence. Patient's description of current relationship with people who raised him/her: Father is in [place].  Pt talks to him on the phone sometimes.  Mother yells at him.  Still sees and talks to stepfather. Does patient have siblings?: Yes Number of Siblings: 1 (brother) Description of patient's current relationship with siblings: "Alright" relationship with brother who shares mother.  States he has a lot of brothers/sisters on his father's side, does not talk to them. Did patient suffer any verbal/emotional/physical/sexual abuse as a child?: No Did patient suffer from severe childhood neglect?: No Has patient ever been sexually abused/assaulted/raped as an adolescent or adult?: No Was the patient ever a victim of a crime or a disaster?: No Witnessed domestic violence?: No Has patient been effected by domestic violence as an adult?: No Description of domestic violence: N/A  Education:  Highest grade of school patient has completed: 11th  Currently a Consulting civil engineerstudent?: No Learning disability?: No  Employment/Work Situation:   Employment situation: Unemployed What is the longest time patient has a held a job?: 2 years Where was the patient employed at that time?: [employer] Has patient ever been in the [employer] and Companymilitary?: No Has patient ever served in Buyer, retailcombat?: No  Financial Resources:   Surveyor, quantityinancial resources: Support from parents / caregiver, Medicaid, Food stamps Does patient have a representative  payee or guardian?: No  Alcohol/Substance Abuse:   What has been your use of drugs/alcohol within the last 12 months?: Rare marijuana use.  Will use cocaine powder or crack maybe 2 times a month. If attempted  suicide, did drugs/alcohol play a role in this?: No Alcohol/Substance Abuse Treatment Hx: Past Tx, Outpatient If yes, describe treatment: Goes to a substance abuse class in [place]. Has alcohol/substance abuse ever caused legal problems?: No  Social Support System:   Patient's Community Support System: Good Describe Community Support System: Brother, brother's girlfriend, mother, friends, uncle, grandmother Type of faith/religion: Ephriam Knuckles How does patient's faith help to cope with current illness?: Chief Operating Officer:   Leisure and Hobbies: Plays cards, mow yards, pick up trash, take out trash, wash cars, walking  Strengths/Needs:   What things does the patient do well?: Playing video games, shooting basketball when his back/feet/legs don't hurt, playing cards. In what areas does patient struggle / problems for patient: Being a father figure  Discharge Plan:   Does patient have access to transportation?: No Plan for no access to transportation at discharge: Will need a bus pass to get home. Will patient be returning to same living situation after discharge?: Yes Currently receiving community mental health services: Yes (From Whom) (Dr. Howell Rucks, [facility], in [place] - med mgmt) If no, would patient like referral for services when discharged?: Yes (What county?) (Wants to go back to see Dr. Fannie Knee as well as to pain clinic) Does patient have financial barriers related to discharge medications?: Yes Patient description of barriers related to discharge medications: Has Medicaid but no income  Summary/Recommendations:   Summary and Recommendations (to be completed by the evaluator): Zarin is a 34yo male hospitalized with SI originally without a plan, then with a plan to overdose.  He lives in [place] with mother, grandmother, has Medicaid and states will need transportation home.  He goes to [facility] for mental  health, was with [facility] but states his UDS showed substances that he does not remember doing.  He was unsure about signing a release for [facility], but did sign for [place].  The patient would benefit from safety monitoring, medication evaluation, psychoeducation, group therapy, and discharge planning to link with ongoing resources. The patient refused referral to [facility] for smoking cessation.  The Discharge Process and Patient Involvement form was reviewed with patient at the end of the Psychosocial Assessment, and the patient confirmed understanding and signed that document, which was placed in the paper chart. Suicide Prevention Education was reviewed thoroughly, and a brochure left with patient.  The patient signed consent for SPE to be provided to his Connye Burkitt [phone].  Sarina Ser. 01/19/2015

## 2015-01-20 DIAGNOSIS — S24144A Brown-Sequard syndrome at T11-T12 level of thoracic spinal cord, initial encounter: Secondary | ICD-10-CM | POA: Diagnosis present

## 2015-01-20 DIAGNOSIS — F4310 Post-traumatic stress disorder, unspecified: Principal | ICD-10-CM

## 2015-01-20 DIAGNOSIS — F1590 Other stimulant use, unspecified, uncomplicated: Secondary | ICD-10-CM | POA: Diagnosis present

## 2015-01-20 DIAGNOSIS — F1190 Opioid use, unspecified, uncomplicated: Secondary | ICD-10-CM

## 2015-01-20 DIAGNOSIS — F410 Panic disorder [episodic paroxysmal anxiety] without agoraphobia: Secondary | ICD-10-CM

## 2015-01-20 DIAGNOSIS — F19150 Other psychoactive substance abuse with psychoactive substance-induced psychotic disorder with delusions: Secondary | ICD-10-CM

## 2015-01-20 DIAGNOSIS — F1599 Other stimulant use, unspecified with unspecified stimulant-induced disorder: Secondary | ICD-10-CM

## 2015-01-20 HISTORY — DX: Other stimulant use, unspecified, uncomplicated: F15.90

## 2015-01-20 HISTORY — DX: Post-traumatic stress disorder, unspecified: F43.10

## 2015-01-20 HISTORY — DX: Brown-Sequard syndrome at T11-T12 level of thoracic spinal cord, initial encounter: S24.144A

## 2015-01-20 LAB — BASIC METABOLIC PANEL
Anion gap: 9 (ref 5–15)
BUN: 11 mg/dL (ref 6–20)
CALCIUM: 9 mg/dL (ref 8.9–10.3)
CHLORIDE: 104 mmol/L (ref 101–111)
CO2: 28 mmol/L (ref 22–32)
Creatinine, Ser: 0.97 mg/dL (ref 0.61–1.24)
GFR calc Af Amer: >60 mL/min (ref >60)
Glucose, Bld: 99 mg/dL (ref 65–99)
POTASSIUM: 3.7 mmol/L (ref 3.5–5.1)
Sodium: 141 mmol/L (ref 135–145)

## 2015-01-20 MED ORDER — FLUOXETINE HCL 20 MG PO CAPS
20.0000 mg | ORAL_CAPSULE | Freq: Every day | ORAL | Status: DC
  Administered 2015-01-20 – 2015-01-21 (×2): 20 mg via ORAL
  Filled 2015-01-20 (×5): qty 1

## 2015-01-20 MED ORDER — HALOPERIDOL 5 MG PO TABS
2.5000 mg | ORAL_TABLET | Freq: Two times a day (BID) | ORAL | Status: DC
  Administered 2015-01-20 – 2015-01-23 (×6): 2.5 mg via ORAL
  Filled 2015-01-20 (×2): qty 3
  Filled 2015-01-20 (×2): qty 2.5
  Filled 2015-01-20 (×2): qty 3
  Filled 2015-01-20 (×5): qty 2.5
  Filled 2015-01-20: qty 5
  Filled 2015-01-20: qty 2.5

## 2015-01-20 MED ORDER — BENZTROPINE MESYLATE 0.5 MG PO TABS
0.5000 mg | ORAL_TABLET | Freq: Two times a day (BID) | ORAL | Status: DC
  Administered 2015-01-20 – 2015-01-23 (×6): 0.5 mg via ORAL
  Filled 2015-01-20 (×3): qty 1
  Filled 2015-01-20 (×2): qty 6
  Filled 2015-01-20 (×5): qty 1
  Filled 2015-01-20 (×2): qty 6

## 2015-01-20 MED ORDER — LIDOCAINE 5 % EX PTCH
1.0000 | MEDICATED_PATCH | Freq: Every day | CUTANEOUS | Status: DC
  Administered 2015-01-20 – 2015-01-23 (×4): 1 via TRANSDERMAL
  Filled 2015-01-20 (×2): qty 1
  Filled 2015-01-20: qty 3
  Filled 2015-01-20 (×2): qty 1
  Filled 2015-01-20: qty 3
  Filled 2015-01-20 (×2): qty 1

## 2015-01-20 MED ORDER — NAPROXEN 375 MG PO TABS
375.0000 mg | ORAL_TABLET | Freq: Two times a day (BID) | ORAL | Status: DC | PRN
  Administered 2015-01-20: 375 mg via ORAL
  Filled 2015-01-20: qty 6
  Filled 2015-01-20: qty 1
  Filled 2015-01-20: qty 6

## 2015-01-20 MED ORDER — CLONAZEPAM 0.5 MG PO TABS
0.5000 mg | ORAL_TABLET | Freq: Two times a day (BID) | ORAL | Status: DC | PRN
  Administered 2015-01-20 – 2015-01-23 (×7): 0.5 mg via ORAL
  Filled 2015-01-20 (×7): qty 1

## 2015-01-20 NOTE — [facility] Group Notes (Signed)
[facility] LCSW Group Therapy  01/20/2015 1:15 pm  Type of Therapy: Process Group Therapy  Participation Level:  Active  Participation Quality:  Appropriate  Affect:  Flat  Cognitive:  Oriented  Insight:  Improving  Engagement in Group:  Limited  Engagement in Therapy:  Limited  Modes of Intervention:  Activity, Clarification, Education, Problem-solving and Support  Summary of Progress/Problems: Today's group addressed the issue of overcoming obstacles.  Patients were asked to identify their biggest obstacle post d/c that stands in the way of their on-going success, and then problem solve as to how to manage this.  "I need to work on my communication skills.  Like when I'm talking with people for a long time and it's not going so well.  And then we get in an argument, and then I might lose my temper.  I have not had a long term relationship for a long time.  I don't think I have enough patience."  Pt was jumping from one subject to another with no resolve of previous topic.  Got up and went over to window side of room, and spent much of group staring out the window.  Ida Rogueorth, Jeananne Bedwell B 01/20/2015   3:14 PM

## 2015-01-20 NOTE — Progress Notes (Signed)
Patient ID: Cole [name], male   DOB: 08/13/1981, 34 y.o.   MRN: [id number]  DAR: Pt. Denies SI/HI and A/V Hallucinations. Patient does have pain in hist lower back and received pain medication and a lidocaine patch was placed on his lower back. Support and encouragement provided to the patient. Scheduled medications administered to patient per physician's orders. Patient received PRN's throughout the day for anxiety, pain, itching, and nicotine cravings. Patient remains preoccupied with narcotic medications. PT came and evaluated patient today and patient reported he does have swelling in his feet sometimes. Writer advised patient to elevate his lower extremities if that occurs. Patient verbalized understanding. Patient is cooperative with staff. Patient is seen in the milieu interacting with some of his peers and is attending some groups. Q15 minute checks are maintained for safety.

## 2015-01-20 NOTE — Progress Notes (Signed)
Patient ID: Ernst BreachJason M Hicklin, male   DOB: 12/16/1980, 34 y.o.   MRN: [id number]  Patient's EKG was done and placed on chart. No NP in the building to review abnormal EKG.

## 2015-01-20 NOTE — [facility] Group Notes (Signed)
[facility]   01/20/2015 10:24 AM  Participation Quality:  Engaged  Mood/Affect:  Appropriate  Depression Rating:  7  Anxiety Rating:  7  Thoughts of Suicide:  No Will you contract for safety?   NA  Current AVH:  No  Plan for Discharge/Comments:  Pt plans to return to [place], and states he can get a ride from [place] home if we can get himn there.  Is focused on getting a referral to a primary care Dr so he can get a referral to a pain clinic. "I was [facility] from the [facility] because they called me last week to come in for a pill count, and I need a 3 day lead time to arrange transportation, so I missed my appointment.  [place] Janeece RiggersSu in [place] is prescribing Alprazolam, but I need the pain meds.  Will the Dr here refer me?  Maybe Dr Janeece RiggersSu will refer me."  States he lives with his grandmother, that he owes child support and needs to get a job or get disability so he can pay his debts.  Says he has been denied 4- 5 times in his disability appeals, so has recently contacted an attorney for help.    Transportation Means: [place]  Supports: grandmother, mother  Ida Rogueorth, Aldahir Litaker B

## 2015-01-20 NOTE — Plan of Care (Signed)
Problem: Diagnosis: Increased Risk For Suicide Attempt Goal: STG-Patient Will Comply With Medication Regime Outcome: Progressing Patient is compliant with medication regimen however remains preoccupied with Narcotic and Benzodiazepines
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
## 2015-01-20 NOTE — Progress Notes (Signed)
OT Cancellation Note  Patient Details Name: Cole [name] MRN: [id number] DOB: 08/25/1981   Cancelled Treatment:    Reason Eval/Treat Not Completed: Other (comment) PT to check on walker need and will screen for any OT needs. Will check back on pt next date.  Lennox LaityStone, Marcene Laskowski [place]  [phone] 01/20/2015, 12:51 PM

## 2015-01-20 NOTE — Evaluation (Signed)
Physical Therapy Evaluation Patient Details Name: Cole [name] MRN: [id number] DOB: 08/22/1981 Today's Date: 01/20/2015   History of Present Illness   Cole [name] is a 34 year old african american male [facility] after reporting suicidal ideation with a plan to overdose on medications. Lives with his mother and grandomother and currently grandmother is in the hospital so he is very anxious and worried.   Clinical Impression  Pt was ordered due to pt reporting to MD of hip and back pain and viewed at times to have difficulty waling. Prior to session was able to observe pt on the unit and pt was ambulating with very little difficulties, slight antalgic/limp gait on R side, but very functional and moving in hallway with no difficulties. Interviewed pt and pt stated he is getting around just fine, occasionally uses cane at home, however he is doing fine without it here for  The moment. When asked about any difficulties he was having he reported his "feet hurt at times and they swelled occasionally. I noticed pt was wearing diabetic socks , so this helps to prevent increased swelling. He could not describe the pain in his feet , but stated it improved when he elevated his feet. He mentioned occasional L hip pain (nurse stated he told her right hip pain), but this worse when he sits for prolonged periods and improves as he gets up and moves. So encouraged pt to continue to move around daily and walk in halls. Pt agreed, but wanted to know what I could recommend for pain medicine. I deferred this to the MDs.  To sign off at this time, no PT needs , as well as no OT needs noted. Please call is see any other concerns we may be able to address.     Follow Up Recommendations No PT follow up    Equipment Recommendations  None recommended by PT    Recommendations for Other Services       Precautions / Restrictions        Mobility  Bed Mobility Overal bed  mobility: Independent                Transfers Overall transfer level: Independent                  Ambulation/Gait Ambulation/Gait assistance: Independent       Gait velocity: pt didn't want to walk for me during our face to face time and session, howevr I did get to observe pt prior to session walking in hall with functionally gait, slight antalgic gait on R side, however pt stated during interview that he had pain in his Left hip at times. Not consistant with what I was seeing. Gait was functional and not unsteady or unsafe. When interviewing pt, the pt stated his L hip pain is worse after sitting for prolonged times, but improves as he gets up and walks  a little. This would be true if pt does have back issues. So eduated pt to continue to mobilize, not sit for prolonged periiods and walk some laps in hallway when able. He agreed, and stated he didn't have any real difficulties, just wanted to know what I would recommend for pain medicines. This is not my area, refocused on our session and those questions are for  the MD.       Stairs            Wheelchair Mobility    Modified Rankin (  Stroke Patients Only)       Balance                                             Pertinent Vitals/Pain Pain Assessment:  (Didnt state specifically how much pain he was having at the moment . Very vague. He stated he "at times" gets "pain in his feet and swelling ". He had difficulty describing the pain of when and type. But stated it improves when he elevates his feet. )    Home Living Family/patient expects to be discharged to:: Unsure Living Arrangements: Other relatives (mtoiher and grandmother) Available Help at Discharge:  (unsure)           Home Equipment: Gilmer Mor - single point      Prior Function Level of Independence: Independent with assistive device(s)         Comments: pt stated he uses a cone occiassionally at home (reports he uses it in  his Left hand due to left hip pain)     Hand Dominance        Extremity/Trunk Assessment               Lower Extremity Assessment:  (didn't assess pt stated he was waiting for a phone call,a nd didn't really have to much difficulty getting around functionally while here. )         Communication   Communication: No difficulties  Cognition Arousal/Alertness: Awake/alert Behavior During Therapy: WFL for tasks assessed/performed Overall Cognitive Status: Within Functional Limits for tasks assessed                      General Comments      Exercises        Assessment/Plan    PT Assessment Patent does not need any further PT services  PT Diagnosis Difficulty walking   PT Problem List    PT Treatment Interventions     PT Goals (Current goals can be found in the Care Plan section) Acute Rehab PT Goals PT Goal Formulation: All assessment and education complete, DC therapy    Frequency     Barriers to discharge        Co-evaluation               End of Session   Activity Tolerance: Other (comment) (limited by his need for PT and willingness to particiapte. ) Patient left: in chair (in hallway by the phone. ) Nurse Communication: Mobility status    Functional Assessment Tool Used: clinical judgement and assessment Functional Limitation: Mobility: Walking and moving around Mobility: Walking and Moving Around Current Status (Z6109): 0 percent impaired, limited or restricted Mobility: Walking and Moving Around Goal Status (U0454): 0 percent impaired, limited or restricted Mobility: Walking and Moving Around Discharge Status [phone]): 0 percent impaired, limited or restricted    Time: 1600-1615 PT Time Calculation (min) (ACUTE ONLY): 15 min   Charges:   PT Evaluation $Initial PT Evaluation Tier I: 1 Procedure     PT G Codes:   PT G-Codes **NOT FOR INPATIENT CLASS** Functional Assessment Tool Used: clinical judgement and  assessment Functional Limitation: Mobility: Walking and moving around Mobility: Walking and Moving Around Current Status (B1478): 0 percent impaired, limited or restricted Mobility: Walking and Moving Around Goal Status (G9562): 0 percent impaired, limited or restricted Mobility: Walking and Moving Around Discharge  Status [phone]): 0 percent impaired, limited or restricted    Cole [name] 01/20/2015, 4:40 PM  Marella Bile, PT Pager: [phone] 01/20/2015

## 2015-01-20 NOTE — Progress Notes (Signed)
D. Pt had been up and visible in milieu this evening, did attend and participate in evening group activity. Pt has endorsed depression and auditory hallucinations this evening and was seen responding to internal stimuli. Pt does appear flat and depressed, did receive all medications this evening without incident. A. Support and encouragement provided. R. Safety maintained, will continue to monitor.
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
## 2015-01-21 LAB — LIPID PANEL
CHOL/HDL RATIO: 3.8 ratio
CHOLESTEROL: 144 mg/dL (ref 0–200)
HDL: 38 mg/dL — AB (ref >40)
LDL CALC: 62 mg/dL (ref 0–99)
TRIGLYCERIDES: 221 mg/dL — AB (ref <150)
VLDL: 44 mg/dL — AB (ref 0–40)

## 2015-01-21 LAB — TSH: TSH: 3.602 u[IU]/mL (ref 0.350–4.500)

## 2015-01-21 LAB — PROTIME-INR
INR: 0.92 (ref 0.00–1.49)
PROTHROMBIN TIME: 12.6 s (ref 11.6–15.2)

## 2015-01-21 MED ORDER — OXYCODONE HCL 5 MG PO TABS
5.0000 mg | ORAL_TABLET | Freq: Two times a day (BID) | ORAL | Status: DC | PRN
Start: 2015-01-21 — End: 2015-01-23
  Administered 2015-01-21 – 2015-01-23 (×4): 5 mg via ORAL
  Filled 2015-01-21 (×4): qty 1

## 2015-01-21 MED ORDER — TRAZODONE HCL 100 MG PO TABS
100.0000 mg | ORAL_TABLET | Freq: Every day | ORAL | Status: DC
  Administered 2015-01-21 – 2015-01-22 (×2): 100 mg via ORAL
  Filled 2015-01-21 (×3): qty 1
  Filled 2015-01-21 (×2): qty 3

## 2015-01-21 MED ORDER — FLUOXETINE HCL 20 MG PO CAPS
40.0000 mg | ORAL_CAPSULE | Freq: Every day | ORAL | Status: DC
  Administered 2015-01-22 – 2015-01-23 (×2): 40 mg via ORAL
  Filled 2015-01-21: qty 6
  Filled 2015-01-21 (×2): qty 2
  Filled 2015-01-21: qty 6
  Filled 2015-01-21: qty 2

## 2015-01-21 NOTE — [facility] Group Notes (Signed)
[facility] LCSW Group Therapy  01/21/2015 , 2:17 PM   Type of Therapy:  Group Therapy  Participation Level:  Active  Participation Quality:  Attentive  Affect:  Appropriate  Cognitive:  Alert  Insight:  Improving  Engagement in Therapy:  Engaged  Modes of Intervention:  Discussion, Exploration and Socialization  Summary of Progress/Problems: Today's group focused on the term Diagnosis.  Participants were asked to define the term, and then pronounce whether it is a negative, positive or neutral term.  Pt was lying down in day room, asleep, prior to start of group.  I woke him up and indicated we would like him in group, but he has to sit up if he wants to stay.  He sat up, paid attention for the first 8 minutes, then slumped over and went back to sleep.  Daryel Geraldorth, Lanetra Hartley B 01/21/2015 , 2:17 PM

## 2015-01-21 NOTE — Progress Notes (Signed)
D: Pt was pleasant and cooperative. Writer observed pt in the dayroom how, pt didn't appear to be interacting with his peers. Pt informed the writer that he has 2 dr's appointments in the near future. However, pt couldn't remember the dates. Stated that he believes he has an appt on 06/01, 06/03 or 06/15. Pt discussed the next possibility that he may be able to get his oxycodone. Writer informed that it have to be 2345 or later. Pt asked the writer if she would wake him if he was asleep. Writer informed pt that he would not be awakened if he was asleep. Told him if he awakened he would be able to get the med.   A:  Support and encouragement was offered. 15 min checks continued for safety.  R: Pt remains safe.

## 2015-01-21 NOTE — [facility] Suicide Risk Assessment (Signed)
[facility] INPATIENT:  Family/Significant Other Suicide Prevention Education  Suicide Prevention Education:  Education Completed; Wilford GristOla Ratliff, grandmother, [phone] has been identified by the patient as the family member/significant other with whom the patient will be residing, and identified as the person(s) who will aid the patient in the event of a mental health crisis (suicidal ideations/suicide attempt).  With written consent from the patient, the family member/significant other has been provided the following suicide prevention education, prior to the and/or following the discharge of the patient.  The suicide prevention education provided includes the following:  Suicide risk factors  Suicide prevention and interventions  National Suicide Hotline telephone number  [facility] assessment telephone number  [facility] Emergency Assistance 911  [facility] and/or [facility] telephone number  Request made of family/significant other to:  Remove weapons (e.g., guns, rifles, knives), all items previously/currently identified as safety concern.    Remove drugs/medications (over-the-counter, prescriptions, illicit drugs), all items previously/currently identified as a safety concern.  The family member/significant other verbalizes understanding of the suicide prevention education information provided.  The family member/significant other agrees to remove the items of safety concern listed above.  Daryel Geraldorth, Salena Ortlieb B 01/21/2015, 3:12 PM

## 2015-01-21 NOTE — Plan of Care (Signed)
Problem: Ineffective individual coping Goal: STG: Patient will remain free from self harm Outcome: Completed/Met Date Met:  01/21/15 Patient remains free from self harm.     
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
## 2015-01-21 NOTE — [facility] Group Notes (Signed)
[facility] Group Notes:  (Nursing/MHT/Case Management/Adjunct)  Date:  01/21/2015  Time:  9:57 AM  Type of Therapy:  Nurse Education  Participation Level:  Minimal  Participation Quality:  Inattentive  Affect:  Blunted  Cognitive:  Appropriate  Insight:  Lacking  Engagement in Group:  Lacking  Modes of Intervention:  Discussion and Education  The purposed of this group is to discuss the topic of the day which is recovery. Daily booklets were given to patients and patients were to verbalized what recovery means to them. Patient was in group in the beginning but left shortly after in order to speak with MD. Patient did not get a chance to state what recovery means to him.

## 2015-01-21 NOTE — Progress Notes (Signed)
Patient ID: Cole BreachJason M Orcutt, male   DOB: 01/07/1981, 34 y.o.   MRN: [id number]  DAR: Pt. Denies SI/HI and A/V Hallucinations. Patient reports pain in lower back and received PRN Oxycodone and a Lidoderm patch was placed. Patient reported no change in pain however patient is seen in the day room sleeping soundly and talking on the phone with no distress. Support and encouragement provided to the patient. Scheduled medications administered to patient per physician's orders. Patient received PRN Klonopin for anxiety and PRN nicorette for nicotine craving. Patient is receptive and cooperative but remains preoccupied with medications specifically those that are controlled. Patient is seen in the milieu and is attending some groups. Patient is napping throughout the day. Q15 minute checks are maintained for safety.

## 2015-01-22 ENCOUNTER — Telehealth (HOSPITAL_COMMUNITY): Payer: Self-pay

## 2015-01-22 DIAGNOSIS — R768 Other specified abnormal immunological findings in serum: Secondary | ICD-10-CM | POA: Clinically undetermined

## 2015-01-22 HISTORY — DX: Other specified abnormal immunological findings in serum: R76.8

## 2015-01-22 LAB — HEPATITIS PANEL, ACUTE
HCV AB: REACTIVE — AB
HEP B S AG: NEGATIVE
Hep A IgM: NONREACTIVE
Hep B C IgM: NONREACTIVE

## 2015-01-22 LAB — HEMOGLOBIN A1C
Hgb A1c MFr Bld: 5.9 % — ABNORMAL HIGH (ref 4.8–5.6)
Mean Plasma Glucose: 123 mg/dL

## 2015-01-22 NOTE — Progress Notes (Signed)
Patient ID: Ernst BreachJason M Westrich, male   DOB: 05/06/1981, 34 y.o.   MRN: [id number]  DAR: Pt. Denies SI/HI and A/V Hallucinations. Patient's affect is flat and mood continues to be depressed. Patient reports chronic lower back pain and received PRN Oxycodone. A Lidocaine patch was placed on his lower back as well. Upon reassessment patient reported no relief however patient is seen later in the afternoon laying on the floor in the hallway in no distress. Writer spoke to patient about not laying in the hallway. Support and encouragement provided to the patient. Scheduled medications administered to patient per physician's orders. Patient is receptive and cooperative but minimal in interaction. Patient comes to some groups however patient sleeps throughout them. Patient speaks to his peers and the staff minimally.  Q15 minute checks are maintained for safety.

## 2015-01-22 NOTE — Progress Notes (Signed)
Patient ID: Cole BreachJason M [name], male   DOB: 06/02/1981, 34 y.o.   MRN: [id number]  Patient was in dayroom laying on a corner table when RN Onalee HuaJanet W. Reported that patient stated to her that his right calf was hurting. RN and Clinical research associatewriter examined his calves and both were symmetrical and non-edematous. Patient's calf was not warm to touch. Writer spoke to patient about elevating his feet if he begins to see swelling. Patient stated he would and requested a heat pack. Patient's calf soreness may be related to walking on the milieu floor with only socks. Patient was given another pair of socks. No distress noted in this patient.

## 2015-01-22 NOTE — Progress Notes (Signed)
D: Pt was in the dayroom but wasn't observed interacting with peers. Pt stated that his day was "ok", but wasn't able to, or chose not to discuss the conversation between him and his dr. However, when asked if he had any questions or concerns pt stated, "see when I can get discharged".  Then pt spoke of his next dose of oxy.  A:  Support and encouragement was offered. 15 min checks continued for safety.  R: Pt remains safe.

## 2015-01-22 NOTE — Plan of Care (Signed)
Problem: Diagnosis: Increased Risk For Suicide Attempt Goal: STG-Patient Will Report Suicidal Feelings to Staff Outcome: Completed/Met Date Met:  01/22/15 Patient denies suicidal feelings to this Probation officer.

## 2015-01-22 NOTE — Progress Notes (Signed)
Patient has given informed consent for testing for HIV.  Jomarie LongsSaramma Twanisha Foulk ,MD Attending Psychiatrist  [facility]
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
## 2015-01-22 NOTE — Progress Notes (Signed)
Did not attend group 

## 2015-01-22 NOTE — Progress Notes (Signed)
Discussed case with Staci Righterobert Comer MD regarding Hep Ab positive lab results.  HCV RNA quant ordered.  Recommended that patient establish primary care provider and in turn referral to ID from PCP.

## 2015-01-22 NOTE — BHH Group Notes (Signed)
[facility]  01/22/2015 , 2:10 PM    Type of Therapy:  Mental Health Association Presentation  Participation Level:  Active  Participation Quality:  Attentive  Affect:  Blunted  Cognitive:  Oriented  Insight:  Limited  Engagement in Therapy:  Engaged  Modes of Intervention:  Discussion, Education and Socialization  Summary of Progress/Problems:  Onalee HuaDavid from [employer] came to present his recovery story and play the guitar.  Fell asleep and began snoring.  We woke him up, and he stayed for 5 more minutes, then left and did not return.  Daryel Geraldorth, Persis Graffius B 01/22/2015 , 2:10 PM

## 2015-01-23 ENCOUNTER — Encounter (HOSPITAL_COMMUNITY): Payer: Self-pay | Admitting: Registered Nurse

## 2015-01-23 LAB — HIV ANTIBODY (ROUTINE TESTING W REFLEX): HIV SCREEN 4TH GENERATION: NONREACTIVE

## 2015-01-23 LAB — HCV RNA QUANT
HCV QUANT: 2636 [IU]/mL — AB (ref <15)
HCV Quantitative Log: 3.42 {Log} — ABNORMAL HIGH (ref <1.18)

## 2015-01-23 LAB — RPR: RPR Ser Ql: NONREACTIVE

## 2015-01-23 MED ORDER — TRAZODONE HCL 100 MG PO TABS: 100.0000 mg | ORAL_TABLET | Freq: Every evening | ORAL | Status: DC | PRN

## 2015-01-23 MED ORDER — CLONAZEPAM 0.5 MG PO TABS: 0.5000 mg | ORAL_TABLET | Freq: Two times a day (BID) | ORAL | Status: DC | PRN

## 2015-01-23 MED ORDER — NAPROXEN 375 MG PO TABS: 375.0000 mg | ORAL_TABLET | Freq: Two times a day (BID) | ORAL | Status: DC | PRN

## 2015-01-23 MED ORDER — BENZTROPINE MESYLATE 0.5 MG PO TABS: 0.5000 mg | ORAL_TABLET | Freq: Two times a day (BID) | ORAL | Status: DC

## 2015-01-23 MED ORDER — HALOPERIDOL 0.5 MG PO TABS: 2.5000 mg | ORAL_TABLET | Freq: Two times a day (BID) | ORAL | Status: DC

## 2015-01-23 MED ORDER — OXYCODONE HCL 5 MG PO TABS: 5.0000 mg | ORAL_TABLET | Freq: Two times a day (BID) | ORAL | Status: DC | PRN

## 2015-01-23 MED ORDER — LIDOCAINE 5 % EX PTCH: 1.0000 | MEDICATED_PATCH | Freq: Every day | CUTANEOUS | Status: DC | PRN

## 2015-01-23 MED ORDER — FLUOXETINE HCL 40 MG PO CAPS: 40.0000 mg | ORAL_CAPSULE | Freq: Every day | ORAL | Status: DC

## 2015-01-23 MED ORDER — DIPHENHYDRAMINE-ZINC ACETATE 2-0.1 % EX CREA: TOPICAL_CREAM | Freq: Three times a day (TID) | CUTANEOUS | Status: DC | PRN

## 2015-01-23 NOTE — BHH Suicide Risk Assessment (Signed)
[facility]   Demographic Factors:  Male  Total Time spent with patient: 30 minutes  Musculoskeletal: Strength & Muscle Tone: within normal limits Gait & Station: normal Patient leans: N/A  Psychiatric Specialty Exam: Physical Exam  Review of Systems  Musculoskeletal: Positive for back pain (chronic).  Psychiatric/Behavioral: Positive for substance abuse (stable).  All other systems reviewed and are negative.   Blood pressure 123/80, pulse 83, temperature 99.7 F (37.6 C), temperature source Oral, resp. rate 18, height 5\' 9"  (1.753 m), weight 92.987 kg (205 lb).Body mass index is 30.26 kg/(m^2).  General Appearance: Casual  Eye Contact::  Fair  Speech:  Clear and Coherent409  Volume:  Normal  Mood:  Euthymic  Affect:  Appropriate  Thought Process:  Coherent  Orientation:  Full (Time, Place, and Person)  Thought Content:  WDL  Suicidal Thoughts:  No  Homicidal Thoughts:  No  Memory:  Immediate;   Fair Recent;   Fair Remote;   Fair  Judgement:  Fair  Insight:  Shallow  Psychomotor Activity:  Normal  Concentration:  Fair  Recall:  [employer]  Fund of Knowledge:Fair  Language: Fair  Akathisia:  No  Handed:  Right  AIMS (if indicated):     Assets:  Communication Skills Desire for Improvement Social Support  Sleep:  Number of Hours: 6  Cognition: WNL  ADL's:  Intact   Have you used any form of tobacco in the last 30 days? (Cigarettes, Smokeless Tobacco, Cigars, and/or Pipes): Yes  Has this patient used any form of tobacco in the last 30 days? (Cigarettes, Smokeless Tobacco, Cigars, and/or Pipes) Yes, Prescription given for nicotine gum.  Mental Status Per Nursing Assessment::   On Admission:  NA  Current Mental Status by Physician: pt denies SI/HI/AH/VH  Loss Factors: Decline in physical health  Historical Factors: Impulsivity  Risk Reduction Factors:   Living with another person, especially a relative and Positive social  support  Continued Clinical Symptoms:  Chronic Pain Previous Psychiatric Diagnoses and Treatments Medical Diagnoses and Treatments/Surgeries  Cognitive Features That Contribute To Risk:  Polarized thinking    Suicide Risk:  Minimal: No identifiable suicidal ideation.  Patients presenting with no risk factors but with morbid ruminations; may be classified as minimal risk based on the severity of the depressive symptoms  Principal Problem: PTSD (post-traumatic stress disorder) Discharge Diagnoses:  Patient Active Problem List   Diagnosis Date Noted  . Hepatitis C reactive [R89.4] 01/22/2015  . PTSD (post-traumatic stress disorder) [F43.10] 01/20/2015  . Panic disorder [F41.0] 01/20/2015  . 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 Opioid use disorder, moderate, in controlled environment [F11.90] 01/20/2015  . 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 Hx of schizophrenia [Z86.59] 01/20/2015  . Chronic pain [G89.29] 10/17/2011  . Failed vision screen 05/12/2011    Follow-up Information    [facility] .   Why:  THEY WILL CALL YOU FOR APPT   Contact information:   [facility] [address]   2nd floor   [place], [place] [address] [phone]      Follow up with [facility]    .   Contact information:   [address] [place] [phone]      [facility] On 02/27/2015.   Why:  Thursday at 10:20  Contact information:   [address]  [place]  [phone]      Plan Of Care/Follow-up recommendations:  Activity:  No restrictions Diet:  regular Tests:  pT CAME BACK POSITIVE FOR HEPATITIS C, Hepatitis viral load pending, HIV/RPR pending . Pt to be called at [phone] it is  resulted. Other:  follow up with PMD ,for  Hepatitic C being reactive.  Is patient on multiple antipsychotic therapies at discharge:  No   Has Patient had three or more failed trials of antipsychotic monotherapy by history:  No  Recommended Plan for Multiple Antipsychotic Therapies: NA    Lawton Dollinger MD 01/23/2015, 9:28 AM

## 2015-01-23 NOTE — Progress Notes (Signed)
[facility] 0930  The focus of this group is to educate the patient on the purpose and policies of crisis stabilization and provide a format to answer questions about their admission.  The group details unit policies and expectations of patients while admitted.  Patient was able to verbally state three coping skill and how he plans to use these skills when discharged from hospital to prevent relapse/readmission.

## 2015-01-23 NOTE — Progress Notes (Signed)
  [facility] :  Will you be returning to the same living situation after discharge:  Yes,  home with mother, grandmother At discharge, do you have transportation home?: Yes,  [place] Do you have the ability to pay for your medications: Yes,  MCD  Release of information consent forms completed and in the chart;  Patient's signature needed at discharge.  Patient to Follow up at: Follow-up Information    [facility] .   Why:  THEY WILL CALL YOU FOR APPT   Contact information:   [facility] [address]   2nd floor   [place], [place] [address] [phone]      Follow up with [facility]    .   Contact information:   [address] [place] [phone]      Follow [facility] On 02/27/2015.   Why:  Thursday at 10:20   Contact information:   [address]  [place]  [phone]      Patient denies SI/HI: Yes,  yes    Safety Planning and Suicide Prevention discussed: Yes,  yes  Have you used any form of tobacco in the last 30 days? (Cigarettes, Smokeless Tobacco, Cigars, and/or Pipes): Yes  Has patient been referred to the Quitline?: Patient refused referral  Cole [name], Cole [name] 01/23/2015, 9:08 AM

## 2015-01-23 NOTE — Tx Team (Signed)
Interdisciplinary Treatment Plan Update (Adult)  Date:  01/23/2015   Time Reviewed:  8:54 AM   Progress in Treatment: Attending groups: Yes. Participating in groups:  Yes. Taking medication as prescribed:  Yes. Tolerating medication:  Yes. Family/Significant othe contact made:  Yes Patient understands diagnosis:  Yes   Discussing patient identified problems/goals with staff:  Yes, see initial care plan. Medical problems stabilized or resolved:  Yes. Denies suicidal/homicidal ideation: Yes. Issues/concerns per patient self-inventory:  No. Other:  New problem(s) identified:  Discharge Plan or Barriers:  Return home, follow up outpt  Reason for Continuation of Hospitalization:   Comments:  Estimated length of stay: D/C today  New goal(s):  Review of initial/current patient goals per problem list:     Attendees: Patient:  01/23/2015 8:54 AM   Family:   01/23/2015 8:54 AM   Physician:  Jomarie LongsSaramma Eappen, MD 01/23/2015 8:54 AM   Nursing:   Nestor Ramproy Duncan, RN 01/23/2015 8:54 AM   CSW:    Daryel Geraldodney Camryn Lampson, LCSW   01/23/2015 8:54 AM   Other:  01/23/2015 8:54 AM   Other:   01/23/2015 8:54 AM   Other:  Onnie BoerJennifer Clark, Nurse CM 01/23/2015 8:54 AM   Other:  Leisa LenzValerie Enoch, [facility] 01/23/2015 8:54 AM   Other:  Tomasita Morrowelora Sutton, P4CC  01/23/2015 8:54 AM   Other:  01/23/2015 8:54 AM   Other:  01/23/2015 8:54 AM   Other:  01/23/2015 8:54 AM   Other:  01/23/2015 8:54 AM   Other:  01/23/2015 8:54 AM   Other:   01/23/2015 8:54 AM    Scribe for Treatment Team:   Ida RogueNorth, Dae Antonucci B, 01/23/2015 8:54 AM

## 2015-01-23 NOTE — Progress Notes (Signed)
Discussed with patient the results of his HIV and RPR test. HEp C viral test pending. Patient to be contacted on his phone - [phone] with results.  Jomarie LongsSaramma Divante Kotch ,MD Attending Psychiatrist  [facility]

## 2015-01-23 NOTE — Progress Notes (Signed)
Nutrition Education Note  RD consulted for nutrition education regarding hyperlipidemia education.   Lipid Panel     Component Value Date/Time   CHOL 144 01/21/2015 0631   TRIG 221* 01/21/2015 0631   HDL 38* 01/21/2015 0631   CHOLHDL 3.8 01/21/2015 0631   VLDL 44* 01/21/2015 0631   LDLCALC 62 01/21/2015 0631    RD provided "High Cholesterol Nutrition Therapy" handout from the Academy of Nutrition and Dietetics. Reviewed patient's dietary recall. Provided examples on ways to decrease fat intake in diet. Discouraged intake of processed foods. Encouraged fresh fruits and vegetables as well as whole grain sources of carbohydrates to maximize fiber intake. Teach back method used. Pt reports he has not had diet education in the past.   Expect fair compliance.  Body mass index is 30.26 kg/(m^2). Pt meets criteria for obesity based on current BMI.  Current diet order is Regular, patient is eating ad lib for meals and snacks at this time. Labs and medications reviewed. No further nutrition interventions warranted at this time. RD contact information provided. If additional nutrition issues arise, please re-consult RD.    Trenton GammonJessica Marsalis Beaulieu, RD, LDN Inpatient Clinical Dietitian Pager # [phone] After hours/weekend pager # [phone]

## 2015-01-23 NOTE — Progress Notes (Signed)
Pt discharged per MD orders; pt currently denies SI/HI and auditory/visual hallucinations; pt was given education by RN regarding follow-up appointments and medications and pt denied any questions or concerns about these instructions; pt was then escorted to search room to retrieve his belongings by RN before being discharged to hospital lobby. 
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
## 2015-01-24 NOTE — Progress Notes (Signed)
Attempted to contact patient again this afternoon at the phone number provided by patient- [phone] - to discuss HCV RNA Quant results - patient did not respond. Could not leave voice mail, since it did not give an option to do so .   Jomarie LongsSaramma Ahaan Zobrist ,MD Attending Psychiatrist  [facility]

## 2015-01-24 NOTE — Progress Notes (Signed)
Attempted to contact patient at the phone number provided by patient- [phone] - to discuss HCV RNA Quant results - patient did not respond. Could not leave voice mail, since it did not give an option to do so .   Jomarie LongsSaramma Danali Marinos ,MD Attending Psychiatrist  [facility]

## 2015-01-26 LAB — HCV RNA QUANT
HCV Quantitative Log: 3.58 {Log} — ABNORMAL HIGH (ref <1.18)
HCV Quantitative: 3786 IU/mL — ABNORMAL HIGH (ref <15)

## 2015-01-27 ENCOUNTER — Encounter (HOSPITAL_COMMUNITY): Payer: Self-pay | Admitting: Emergency Medicine

## 2015-01-27 ENCOUNTER — Emergency Department (HOSPITAL_COMMUNITY)
Admission: EM | Admit: 2015-01-27 | Discharge: 2015-01-27 | Discharge status: Against Medical Advice | Payer: Medicaid Other | Attending: Emergency Medicine | Admitting: Emergency Medicine

## 2015-01-27 DIAGNOSIS — Z720 Tobacco use: Secondary | ICD-10-CM | POA: Insufficient documentation

## 2015-01-27 DIAGNOSIS — G8929 Other chronic pain: Secondary | ICD-10-CM | POA: Diagnosis not present

## 2015-01-27 DIAGNOSIS — M5489 Other dorsalgia: Secondary | ICD-10-CM | POA: Diagnosis not present

## 2015-01-27 DIAGNOSIS — J45909 Unspecified asthma, uncomplicated: Secondary | ICD-10-CM | POA: Insufficient documentation

## 2015-01-27 DIAGNOSIS — M549 Dorsalgia, unspecified: Secondary | ICD-10-CM | POA: Diagnosis present

## 2015-01-27 HISTORY — DX: Unspecified viral hepatitis C without hepatic coma: B19.20

## 2015-01-27 NOTE — ED Notes (Signed)
Pt states that he has been having back pain.  States that he recently got out of behavioral health and that he was dx with possible hepatitis C and does not really know what that means or what he needs to do.

## 2015-02-02 ENCOUNTER — Emergency Department (HOSPITAL_COMMUNITY)
Admission: EM | Admit: 2015-02-02 | Discharge: 2015-02-02 | Disposition: A | Discharge status: Home / Self Care | Payer: Medicaid Other | Attending: Emergency Medicine | Admitting: Emergency Medicine

## 2015-02-02 ENCOUNTER — Encounter (HOSPITAL_COMMUNITY): Payer: Self-pay | Admitting: *Deleted

## 2015-02-02 DIAGNOSIS — Z8739 Personal history of other diseases of the musculoskeletal system and connective tissue: Secondary | ICD-10-CM | POA: Diagnosis not present

## 2015-02-02 DIAGNOSIS — F329 Major depressive disorder, single episode, unspecified: Secondary | ICD-10-CM | POA: Diagnosis not present

## 2015-02-02 DIAGNOSIS — Z765 Malingerer [conscious simulation]: Secondary | ICD-10-CM | POA: Insufficient documentation

## 2015-02-02 DIAGNOSIS — Z8619 Personal history of other infectious and parasitic diseases: Secondary | ICD-10-CM | POA: Insufficient documentation

## 2015-02-02 DIAGNOSIS — J45909 Unspecified asthma, uncomplicated: Secondary | ICD-10-CM | POA: Diagnosis not present

## 2015-02-02 DIAGNOSIS — G8929 Other chronic pain: Secondary | ICD-10-CM | POA: Insufficient documentation

## 2015-02-02 DIAGNOSIS — R1030 Lower abdominal pain, unspecified: Secondary | ICD-10-CM | POA: Diagnosis present

## 2015-02-02 DIAGNOSIS — Z720 Tobacco use: Secondary | ICD-10-CM | POA: Diagnosis not present

## 2015-02-02 NOTE — Discharge Instructions (Signed)
Chronic Pain Discharge Instructions  °Emergency care providers appreciate that many patients coming to us are in severe pain and we wish to address their pain in the safest, most responsible manner.  It is important to recognize however, that the proper treatment of chronic pain differs from that of the pain of injuries and acute illnesses.  Our goal is to provide quality, safe, personalized care and we thank you for giving us the opportunity to serve you. °The use of narcotics and related agents for chronic pain syndromes may lead to additional physical and psychological problems.  Nearly as many people die from prescription narcotics each year as die from car crashes.  Additionally, this risk is increased if such prescriptions are obtained from a variety of sources.  Therefore, only your primary care physician or a pain management specialist is able to safely treat such syndromes with narcotic medications long-term.   ° °Documentation revealing such prescriptions have been sought from multiple sources may prohibit us from providing a refill or different narcotic medication.  Your name may be checked first through the [place] Controlled Substances Reporting System.  This database is a record of controlled substance medication prescriptions that the patient has received.  This has been established by [facility] in an effort to eliminate the dangerous, and often life threatening, practice of obtaining multiple prescriptions from different medical providers.  ° °If you have a chronic pain syndrome (i.e. chronic headaches, recurrent back or neck pain, dental pain, abdominal or pelvis pain without a specific diagnosis, or neuropathic pain such as fibromyalgia) or recurrent visits for the same condition without an acute diagnosis, you may be treated with non-narcotics and other non-addictive medicines.  Allergic reactions or negative side effects that may be reported by a patient to such medications will not  typically lead to the use of a narcotic analgesic or other controlled substance as an alternative. °  °Patients managing chronic pain with a personal physician should have provisions in place for breakthrough pain.  If you are in crisis, you should call your physician.  If your physician directs you to the emergency department, please have the doctor call and speak to our attending physician concerning your care. °  °When patients come to the Emergency Department (ED) with acute medical conditions in which the Emergency Department physician feels appropriate to prescribe narcotic or sedating pain medication, the physician will prescribe these in very limited quantities.  The amount of these medications will last only until you can see your primary care physician in his/her office.  Any patient who returns to the ED seeking refills should expect only non-narcotic pain medications.  ° °In the event of an acute medical condition exists and the emergency physician feels it is necessary that the patient be given a narcotic or sedating medication -  a responsible adult driver should be present in the room prior to the medication being given by the nurse. °  °Prescriptions for narcotic or sedating medications that have been lost, stolen or expired will not be refilled in the Emergency Department.   ° °Patients who have chronic pain may receive non-narcotic prescriptions until seen by their primary care physician.  It is every patient’s personal responsibility to maintain active prescriptions with his or her primary care physician or specialist. °
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
## 2015-02-02 NOTE — ED Notes (Addendum)
Pt reporting pain in abdomen and back.  States that he believes he has a kidney stone.  Pt reporting pain on both sides. Pt reports being in department a few days ago for pain, but left without being seen "because it was taking too long".

## 2015-03-03 ENCOUNTER — Emergency Department (HOSPITAL_COMMUNITY)
Admission: EM | Admit: 2015-03-03 | Discharge: 2015-03-03 | Disposition: A | Discharge status: Home / Self Care | Payer: Medicaid Other | Attending: Emergency Medicine | Admitting: Emergency Medicine

## 2015-03-03 ENCOUNTER — Encounter (HOSPITAL_COMMUNITY): Payer: Self-pay | Admitting: Emergency Medicine

## 2015-03-03 DIAGNOSIS — F329 Major depressive disorder, single episode, unspecified: Secondary | ICD-10-CM | POA: Insufficient documentation

## 2015-03-03 DIAGNOSIS — J45909 Unspecified asthma, uncomplicated: Secondary | ICD-10-CM | POA: Insufficient documentation

## 2015-03-03 DIAGNOSIS — Z720 Tobacco use: Secondary | ICD-10-CM | POA: Insufficient documentation

## 2015-03-03 DIAGNOSIS — F209 Schizophrenia, unspecified: Secondary | ICD-10-CM | POA: Diagnosis not present

## 2015-03-03 DIAGNOSIS — F419 Anxiety disorder, unspecified: Secondary | ICD-10-CM | POA: Insufficient documentation

## 2015-03-03 DIAGNOSIS — M545 Low back pain: Secondary | ICD-10-CM | POA: Diagnosis not present

## 2015-03-03 DIAGNOSIS — M79671 Pain in right foot: Secondary | ICD-10-CM | POA: Insufficient documentation

## 2015-03-03 DIAGNOSIS — G8929 Other chronic pain: Secondary | ICD-10-CM | POA: Diagnosis not present

## 2015-03-03 DIAGNOSIS — Z79899 Other long term (current) drug therapy: Secondary | ICD-10-CM | POA: Insufficient documentation

## 2015-03-03 DIAGNOSIS — Z8619 Personal history of other infectious and parasitic diseases: Secondary | ICD-10-CM | POA: Diagnosis not present

## 2015-03-03 DIAGNOSIS — Z87828 Personal history of other (healed) physical injury and trauma: Secondary | ICD-10-CM | POA: Insufficient documentation

## 2015-03-03 DIAGNOSIS — M542 Cervicalgia: Secondary | ICD-10-CM | POA: Diagnosis present

## 2015-03-03 DIAGNOSIS — G4700 Insomnia, unspecified: Secondary | ICD-10-CM | POA: Diagnosis not present

## 2015-03-03 MED ORDER — OXYCODONE HCL 5 MG PO TABS
10.0000 mg | ORAL_TABLET | Freq: Once | ORAL | Status: AC
  Administered 2015-03-03: 10 mg via ORAL
  Filled 2015-03-03: qty 2

## 2015-03-03 NOTE — ED Notes (Signed)
Patient with no complaints at this time. Respirations even and unlabored. Skin warm/dry. Discharge instructions reviewed with patient at this time. Patient given opportunity to voice concerns/ask questions. Patient discharged at this time and left Emergency Department with steady gait.   
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
## 2015-03-03 NOTE — Discharge Instructions (Signed)

## 2015-03-03 NOTE — ED Notes (Signed)
Pt reports neck, back and foot pain.

## 2015-03-28 ENCOUNTER — Emergency Department (HOSPITAL_COMMUNITY)
Admission: EM | Admit: 2015-03-28 | Discharge: 2015-03-28 | Disposition: A | Discharge status: Home / Self Care | Payer: Medicaid Other | Attending: Emergency Medicine | Admitting: Emergency Medicine

## 2015-03-28 ENCOUNTER — Encounter (HOSPITAL_COMMUNITY): Payer: Self-pay | Admitting: Emergency Medicine

## 2015-03-28 DIAGNOSIS — Y9389 Activity, other specified: Secondary | ICD-10-CM | POA: Diagnosis not present

## 2015-03-28 DIAGNOSIS — Y9289 Other specified places as the place of occurrence of the external cause: Secondary | ICD-10-CM | POA: Insufficient documentation

## 2015-03-28 DIAGNOSIS — Z720 Tobacco use: Secondary | ICD-10-CM | POA: Diagnosis not present

## 2015-03-28 DIAGNOSIS — F329 Major depressive disorder, single episode, unspecified: Secondary | ICD-10-CM | POA: Insufficient documentation

## 2015-03-28 DIAGNOSIS — F419 Anxiety disorder, unspecified: Secondary | ICD-10-CM | POA: Diagnosis not present

## 2015-03-28 DIAGNOSIS — Z8619 Personal history of other infectious and parasitic diseases: Secondary | ICD-10-CM | POA: Diagnosis not present

## 2015-03-28 DIAGNOSIS — Z8709 Personal history of other diseases of the respiratory system: Secondary | ICD-10-CM | POA: Diagnosis not present

## 2015-03-28 DIAGNOSIS — Y998 Other external cause status: Secondary | ICD-10-CM | POA: Diagnosis not present

## 2015-03-28 DIAGNOSIS — S199XXA Unspecified injury of neck, initial encounter: Secondary | ICD-10-CM | POA: Insufficient documentation

## 2015-03-28 DIAGNOSIS — X58XXXA Exposure to other specified factors, initial encounter: Secondary | ICD-10-CM | POA: Diagnosis not present

## 2015-03-28 DIAGNOSIS — J45909 Unspecified asthma, uncomplicated: Secondary | ICD-10-CM | POA: Insufficient documentation

## 2015-03-28 DIAGNOSIS — S39012A Strain of muscle, fascia and tendon of lower back, initial encounter: Secondary | ICD-10-CM | POA: Diagnosis not present

## 2015-03-28 DIAGNOSIS — G8929 Other chronic pain: Secondary | ICD-10-CM | POA: Insufficient documentation

## 2015-03-28 DIAGNOSIS — Z8669 Personal history of other diseases of the nervous system and sense organs: Secondary | ICD-10-CM | POA: Diagnosis not present

## 2015-03-28 DIAGNOSIS — S3992XA Unspecified injury of lower back, initial encounter: Secondary | ICD-10-CM | POA: Diagnosis present

## 2015-03-28 MED ORDER — METHOCARBAMOL 500 MG PO TABS
1000.0000 mg | ORAL_TABLET | Freq: Once | ORAL | Status: AC
  Administered 2015-03-28: 1000 mg via ORAL
  Filled 2015-03-28: qty 2

## 2015-03-28 MED ORDER — OXYCODONE HCL 5 MG PO TABS
5.0000 mg | ORAL_TABLET | Freq: Once | ORAL | Status: AC
  Administered 2015-03-28: 5 mg via ORAL
  Filled 2015-03-28: qty 1
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
## 2015-03-28 NOTE — Discharge Instructions (Signed)
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
## 2015-04-06 ENCOUNTER — Encounter (HOSPITAL_COMMUNITY): Payer: Self-pay | Admitting: Emergency Medicine

## 2015-04-06 ENCOUNTER — Emergency Department (HOSPITAL_COMMUNITY)
Admission: EM | Admit: 2015-04-06 | Discharge: 2015-04-06 | Discharge status: Against Medical Advice | Payer: Medicaid Other | Attending: Emergency Medicine | Admitting: Emergency Medicine

## 2015-04-06 DIAGNOSIS — M545 Low back pain: Secondary | ICD-10-CM | POA: Diagnosis not present

## 2015-04-06 DIAGNOSIS — R51 Headache: Secondary | ICD-10-CM | POA: Insufficient documentation

## 2015-04-06 DIAGNOSIS — M79671 Pain in right foot: Secondary | ICD-10-CM | POA: Insufficient documentation

## 2015-04-06 DIAGNOSIS — J45909 Unspecified asthma, uncomplicated: Secondary | ICD-10-CM | POA: Insufficient documentation

## 2015-04-06 DIAGNOSIS — Z720 Tobacco use: Secondary | ICD-10-CM | POA: Diagnosis not present

## 2015-04-06 DIAGNOSIS — G8929 Other chronic pain: Secondary | ICD-10-CM | POA: Insufficient documentation

## 2015-04-06 NOTE — ED Notes (Signed)
Patient complaining of headache for 2 days. Also complaining of right foot pain x 2 days and lower back pain and muscle spasms per patient. Patient reports "I am supposed to see the doctor tomorrow but I wanted to see if I could get a note." Patient states was seen recently in ED for same symptoms.

## 2015-04-06 NOTE — ED Notes (Signed)
Patient now stating he wants solution for his eyes. States, "I have trouble closing my eyes."

## 2015-04-09 ENCOUNTER — Emergency Department (HOSPITAL_COMMUNITY)
Admission: EM | Admit: 2015-04-09 | Discharge: 2015-04-09 | Disposition: A | Discharge status: Home / Self Care | Payer: Medicaid Other | Attending: Emergency Medicine | Admitting: Emergency Medicine

## 2015-04-09 ENCOUNTER — Encounter (HOSPITAL_COMMUNITY): Payer: Self-pay | Admitting: Emergency Medicine

## 2015-04-09 DIAGNOSIS — F329 Major depressive disorder, single episode, unspecified: Secondary | ICD-10-CM | POA: Diagnosis not present

## 2015-04-09 DIAGNOSIS — R112 Nausea with vomiting, unspecified: Secondary | ICD-10-CM | POA: Insufficient documentation

## 2015-04-09 DIAGNOSIS — G4700 Insomnia, unspecified: Secondary | ICD-10-CM | POA: Diagnosis not present

## 2015-04-09 DIAGNOSIS — F209 Schizophrenia, unspecified: Secondary | ICD-10-CM | POA: Diagnosis not present

## 2015-04-09 DIAGNOSIS — Z8619 Personal history of other infectious and parasitic diseases: Secondary | ICD-10-CM | POA: Diagnosis not present

## 2015-04-09 DIAGNOSIS — J45909 Unspecified asthma, uncomplicated: Secondary | ICD-10-CM | POA: Insufficient documentation

## 2015-04-09 DIAGNOSIS — Z8709 Personal history of other diseases of the respiratory system: Secondary | ICD-10-CM | POA: Insufficient documentation

## 2015-04-09 DIAGNOSIS — F419 Anxiety disorder, unspecified: Secondary | ICD-10-CM | POA: Insufficient documentation

## 2015-04-09 DIAGNOSIS — Z720 Tobacco use: Secondary | ICD-10-CM | POA: Diagnosis not present

## 2015-04-09 DIAGNOSIS — G8929 Other chronic pain: Secondary | ICD-10-CM | POA: Diagnosis not present

## 2015-04-09 DIAGNOSIS — J029 Acute pharyngitis, unspecified: Secondary | ICD-10-CM | POA: Diagnosis not present

## 2015-04-09 MED ORDER — POLYETHYLENE GLYCOL 3350 17 GM/SCOOP PO POWD: 17.0000 g | Freq: Every day | ORAL | Status: DC

## 2015-04-09 NOTE — ED Notes (Signed)
Lab to nursing station, states patient was refusing lab draw. Campos informed.  Patient to nursing station stated " I dont need my blood took, but my throat hurt". Patient back to room at this time.

## 2015-04-09 NOTE — ED Notes (Signed)
Patient with c/o sore throat since yesterday with emesis. Active vomiting in triage. Multiple complaints.
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
## 2015-04-30 ENCOUNTER — Encounter (HOSPITAL_COMMUNITY): Payer: Self-pay | Admitting: Emergency Medicine

## 2015-04-30 ENCOUNTER — Emergency Department (HOSPITAL_COMMUNITY)
Admission: EM | Admit: 2015-04-30 | Discharge: 2015-05-01 | Disposition: A | Discharge status: Other Acute Inpatient Hospital | Payer: Medicaid Other | Attending: Emergency Medicine | Admitting: Emergency Medicine

## 2015-04-30 DIAGNOSIS — F329 Major depressive disorder, single episode, unspecified: Secondary | ICD-10-CM | POA: Diagnosis not present

## 2015-04-30 DIAGNOSIS — F209 Schizophrenia, unspecified: Secondary | ICD-10-CM | POA: Insufficient documentation

## 2015-04-30 DIAGNOSIS — F419 Anxiety disorder, unspecified: Secondary | ICD-10-CM | POA: Insufficient documentation

## 2015-04-30 DIAGNOSIS — Z720 Tobacco use: Secondary | ICD-10-CM | POA: Diagnosis not present

## 2015-04-30 DIAGNOSIS — G8929 Other chronic pain: Secondary | ICD-10-CM | POA: Diagnosis not present

## 2015-04-30 DIAGNOSIS — J45909 Unspecified asthma, uncomplicated: Secondary | ICD-10-CM | POA: Diagnosis not present

## 2015-04-30 DIAGNOSIS — F1910 Other psychoactive substance abuse, uncomplicated: Secondary | ICD-10-CM | POA: Diagnosis not present

## 2015-04-30 DIAGNOSIS — R45851 Suicidal ideations: Secondary | ICD-10-CM

## 2015-04-30 DIAGNOSIS — Z008 Encounter for other general examination: Secondary | ICD-10-CM | POA: Diagnosis present

## 2015-04-30 DIAGNOSIS — Z8619 Personal history of other infectious and parasitic diseases: Secondary | ICD-10-CM | POA: Diagnosis not present

## 2015-04-30 LAB — BASIC METABOLIC PANEL
Anion gap: 4 — ABNORMAL LOW (ref 5–15)
BUN: 11 mg/dL (ref 6–20)
CALCIUM: 8.7 mg/dL — AB (ref 8.9–10.3)
CHLORIDE: 108 mmol/L (ref 101–111)
CO2: 30 mmol/L (ref 22–32)
Creatinine, Ser: 0.94 mg/dL (ref 0.61–1.24)
GFR calc non Af Amer: >60 mL/min (ref >60)
Glucose, Bld: 118 mg/dL — ABNORMAL HIGH (ref 65–99)
Potassium: 3.8 mmol/L (ref 3.5–5.1)
SODIUM: 142 mmol/L (ref 135–145)

## 2015-04-30 LAB — RAPID URINE DRUG SCREEN, HOSP PERFORMED
Amphetamines: NOT DETECTED
Barbiturates: NOT DETECTED
Benzodiazepines: NOT DETECTED
Cocaine: NOT DETECTED
OPIATES: NOT DETECTED
Tetrahydrocannabinol: NOT DETECTED

## 2015-04-30 LAB — CBC WITH DIFFERENTIAL/PLATELET
Basophils Absolute: 0 10*3/uL (ref 0.0–0.1)
Basophils Relative: 0 % (ref 0–1)
EOS ABS: 0.1 10*3/uL (ref 0.0–0.7)
EOS PCT: 2 % (ref 0–5)
HCT: 41.7 % (ref 39.0–52.0)
Hemoglobin: 13.9 g/dL (ref 13.0–17.0)
LYMPHS ABS: 2 10*3/uL (ref 0.7–4.0)
LYMPHS PCT: 36 % (ref 12–46)
MCH: 26.1 pg (ref 26.0–34.0)
MCHC: 33.3 g/dL (ref 30.0–36.0)
MCV: 78.2 fL (ref 78.0–100.0)
MONO ABS: 0.4 10*3/uL (ref 0.1–1.0)
Monocytes Relative: 8 % (ref 3–12)
Neutro Abs: 3 10*3/uL (ref 1.7–7.7)
Neutrophils Relative %: 54 % (ref 43–77)
PLATELETS: 145 10*3/uL — AB (ref 150–400)
RBC: 5.33 MIL/uL (ref 4.22–5.81)
RDW: 14.1 % (ref 11.5–15.5)
WBC: 5.6 10*3/uL (ref 4.0–10.5)

## 2015-04-30 LAB — ETHANOL: Alcohol, Ethyl (B): <5 mg/dL (ref <5)

## 2015-04-30 MED ORDER — NICOTINE 21 MG/24HR TD PT24: 21.0000 mg | MEDICATED_PATCH | Freq: Every day | TRANSDERMAL | Status: DC | PRN

## 2015-04-30 MED ORDER — ZOLPIDEM TARTRATE 5 MG PO TABS: 5.0000 mg | ORAL_TABLET | Freq: Every evening | ORAL | Status: DC | PRN

## 2015-04-30 MED ORDER — LORAZEPAM 1 MG PO TABS: 1.0000 mg | ORAL_TABLET | Freq: Three times a day (TID) | ORAL | Status: DC | PRN

## 2015-04-30 MED ORDER — OXYCODONE HCL 5 MG PO TABS: 10.0000 mg | ORAL_TABLET | Freq: Three times a day (TID) | ORAL | Status: DC | PRN

## 2015-04-30 NOTE — ED Notes (Signed)
Patient belongings placed in locker 2x bags.  Jeans,  Shirt,  Underwear, Socks, Shoes.  Patient was placed in paper scrubs, room was stripped down and sitter is at bedside
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
## 2015-04-30 NOTE — ED Notes (Signed)
Pt c/o si ideations and back pain. Pt states he ran out of oxycodone.

## 2015-05-01 ENCOUNTER — Inpatient Hospital Stay (HOSPITAL_COMMUNITY)
Admission: AD | Admit: 2015-05-01 | Discharge: 2015-05-03 | DRG: 897 | Disposition: A | Discharge status: Home / Self Care | Payer: Medicaid Other | Source: Intra-hospital | Attending: Psychiatry | Admitting: Psychiatry

## 2015-05-01 ENCOUNTER — Encounter (HOSPITAL_COMMUNITY): Payer: Self-pay | Admitting: Nurse Practitioner

## 2015-05-01 DIAGNOSIS — F4310 Post-traumatic stress disorder, unspecified: Secondary | ICD-10-CM | POA: Diagnosis present

## 2015-05-01 DIAGNOSIS — F11229 Opioid dependence with intoxication, unspecified: Secondary | ICD-10-CM | POA: Diagnosis not present

## 2015-05-01 DIAGNOSIS — F329 Major depressive disorder, single episode, unspecified: Secondary | ICD-10-CM | POA: Diagnosis present

## 2015-05-01 DIAGNOSIS — F17210 Nicotine dependence, cigarettes, uncomplicated: Secondary | ICD-10-CM | POA: Diagnosis present

## 2015-05-01 DIAGNOSIS — F1124 Opioid dependence with opioid-induced mood disorder: Secondary | ICD-10-CM | POA: Diagnosis present

## 2015-05-01 DIAGNOSIS — R45851 Suicidal ideations: Secondary | ICD-10-CM | POA: Diagnosis present

## 2015-05-01 DIAGNOSIS — F1994 Other psychoactive substance use, unspecified with psychoactive substance-induced mood disorder: Secondary | ICD-10-CM | POA: Diagnosis not present

## 2015-05-01 DIAGNOSIS — F1920 Other psychoactive substance dependence, uncomplicated: Secondary | ICD-10-CM

## 2015-05-01 DIAGNOSIS — F1120 Opioid dependence, uncomplicated: Secondary | ICD-10-CM | POA: Diagnosis present

## 2015-05-01 MED ORDER — TRAMADOL HCL 50 MG PO TABS
50.0000 mg | ORAL_TABLET | Freq: Four times a day (QID) | ORAL | Status: DC | PRN
  Administered 2015-05-01 – 2015-05-02 (×2): 50 mg via ORAL
  Filled 2015-05-01 (×2): qty 1

## 2015-05-01 MED ORDER — HYDROXYZINE HCL 50 MG PO TABS: 50.0000 mg | ORAL_TABLET | Freq: Three times a day (TID) | ORAL | Status: DC | PRN

## 2015-05-01 MED ORDER — MAGNESIUM HYDROXIDE 400 MG/5ML PO SUSP
30.0000 mL | Freq: Every day | ORAL | Status: DC | PRN
Start: 2015-05-01 — End: 2015-05-03

## 2015-05-01 MED ORDER — CYCLOBENZAPRINE HCL 10 MG PO TABS
10.0000 mg | ORAL_TABLET | Freq: Three times a day (TID) | ORAL | Status: DC | PRN
  Administered 2015-05-02: 10 mg via ORAL
  Filled 2015-05-01: qty 1

## 2015-05-01 MED ORDER — CHLORDIAZEPOXIDE HCL 25 MG PO CAPS: 25.0000 mg | ORAL_CAPSULE | ORAL | Status: DC

## 2015-05-01 MED ORDER — ONDANSETRON 4 MG PO TBDP: 4.0000 mg | ORAL_TABLET | Freq: Four times a day (QID) | ORAL | Status: DC | PRN

## 2015-05-01 MED ORDER — CHLORDIAZEPOXIDE HCL 25 MG PO CAPS
25.0000 mg | ORAL_CAPSULE | Freq: Three times a day (TID) | ORAL | Status: AC
  Administered 2015-05-02 – 2015-05-03 (×3): 25 mg via ORAL
  Filled 2015-05-01 (×3): qty 1

## 2015-05-01 MED ORDER — CHLORDIAZEPOXIDE HCL 25 MG PO CAPS
25.0000 mg | ORAL_CAPSULE | Freq: Four times a day (QID) | ORAL | Status: AC
  Administered 2015-05-01 – 2015-05-02 (×4): 25 mg via ORAL
  Filled 2015-05-01 (×4): qty 1

## 2015-05-01 MED ORDER — GABAPENTIN 400 MG PO CAPS
400.0000 mg | ORAL_CAPSULE | Freq: Three times a day (TID) | ORAL | Status: DC
  Administered 2015-05-01 – 2015-05-03 (×6): 400 mg via ORAL
  Filled 2015-05-01 (×12): qty 1

## 2015-05-01 MED ORDER — CHLORDIAZEPOXIDE HCL 25 MG PO CAPS: 25.0000 mg | ORAL_CAPSULE | Freq: Every day | ORAL | Status: DC

## 2015-05-01 MED ORDER — LOPERAMIDE HCL 2 MG PO CAPS: 2.0000 mg | ORAL_CAPSULE | ORAL | Status: DC | PRN

## 2015-05-01 MED ORDER — ALUM & MAG HYDROXIDE-SIMETH 200-200-20 MG/5ML PO SUSP
30.0000 mL | ORAL | Status: DC | PRN
Start: 2015-05-01 — End: 2015-05-03

## 2015-05-01 MED ORDER — NICOTINE POLACRILEX 2 MG MT GUM
CHEWING_GUM | OROMUCOSAL | Status: AC
  Filled 2015-05-01: qty 1

## 2015-05-01 MED ORDER — HYDROXYZINE HCL 25 MG PO TABS: 25.0000 mg | ORAL_TABLET | Freq: Four times a day (QID) | ORAL | Status: DC | PRN

## 2015-05-01 MED ORDER — ADULT MULTIVITAMIN W/MINERALS CH
1.0000 | ORAL_TABLET | Freq: Every day | ORAL | Status: DC
  Administered 2015-05-01 – 2015-05-03 (×3): 1 via ORAL
  Filled 2015-05-01 (×5): qty 1

## 2015-05-01 MED ORDER — CHLORDIAZEPOXIDE HCL 25 MG PO CAPS: 25.0000 mg | ORAL_CAPSULE | Freq: Four times a day (QID) | ORAL | Status: DC | PRN

## 2015-05-01 MED ORDER — GABAPENTIN 300 MG PO CAPS: 300.0000 mg | ORAL_CAPSULE | Freq: Three times a day (TID) | ORAL | Status: DC

## 2015-05-01 MED ORDER — ZOLPIDEM TARTRATE 5 MG PO TABS
5.0000 mg | ORAL_TABLET | Freq: Every evening | ORAL | Status: DC | PRN
  Administered 2015-05-01: 5 mg via ORAL
  Filled 2015-05-01: qty 1

## 2015-05-01 NOTE — [facility] Counselor (Signed)
Pt has been accepted to [facility] 307-1, per Janann August, NP, suggested that TTS look for other placement for pt due to hx of possible med seeking behavior(FYI note), however Tori(AC) discussed disposition with charge nurse(Kim) and pt will admitted to the unit since beds are avail.

## 2015-05-01 NOTE — Tx Team (Signed)
Initial Interdisciplinary Treatment Plan   PATIENT STRESSORS: Financial difficulties Marital or family conflict Substance abuse   PATIENT STRENGTHS: Ability for insight General fund of knowledge Supportive family/friends   PROBLEM LIST: Problem List/Patient Goals Date to be addressed Date deferred Reason deferred Estimated date of resolution  "work on my anxiety" 05-01-2015     "better association" (communication) 05-01-2015     Depression      Suicide      Substance Abuse                               DISCHARGE CRITERIA:  Ability to meet basic life and health needs Adequate post-discharge living arrangements Improved stabilization in mood, thinking, and/or behavior Motivation to continue treatment in a less acute level of care Reduction of life-threatening or endangering symptoms to within safe limits Safe-care adequate arrangements made  PRELIMINARY DISCHARGE PLAN: Attend aftercare/continuing care group Attend 12-step recovery group Outpatient therapy  PATIENT/FAMIILY INVOLVEMENT: This treatment plan has been presented to and reviewed with the patient, GRIGOR LIPSCHUTZ.  The patient and family have been given the opportunity to ask questions and make suggestions.  Claiborne Rigg 05/01/2015, 5:41 AM

## 2015-05-01 NOTE — BHH Counselor (Signed)
Adult Comprehensive Assessment  Patient ID: Cole [name], male   DOB: 1980/11/15, 34 y.o.   MRN: [id number]  Information Source: Information source: Patient  Current Stressors:  Educational / Learning stressors: Needs to go back to get his GED Employment / Job issues: Does not have a job or income. Has been trying to get a job for 5 years, and only gets temporary work, has no transportation. Family Relationships: Loves his family but does not get along with them. They always preach at him, and it makes him angry. Financial / Lack of resources (include bankruptcy): Limited income and child support - is behind $7000. Is trying to get his disaiblity, states he needs to get a lawer. Housing / Lack of housing: Does not have his own place. Physical health (include injuries & life threatening diseases): Feet stay in pain, has a lot of headaches and back pain. Especially his neck hurts. Social relationships: Talks to people on Facebook but they barely respond. Substance abuse: Takes drug tests and will be told he has drugs in his system, and he does not even remember doing the drug such as cocaine. Bereavement / Loss: Denies current stressors - grandmother has been sick, will be very stressful if she dies because he is living with her now.  Living/Environment/Situation:  Living Arrangements: Other relatives (Is living with grandmother and mother) Living conditions (as described by patient or guardian): Has his own room - the whole basement How long has patient lived in current situation?: 20 years What is atmosphere in current home: Comfortable, Supportive, Loving  Family History:  Marital status: Single Does patient have children?: Yes How many children?: 1 (11yo daughter) How is patient's relationship with their children?: Barely gets along with his daughter "because she is too grown." Has not been around her much, so cannot tell her what to do.  Childhood History:  By whom  was/is the patient raised?: Mother, Mother/father and step-parent Additional childhood history information: Mother left father when pt was an infant. Description of patient's relationship with caregiver when they were a child: Pretty good, "pretty straight." Stepfather was part of raising him for awhile, was a good influence. Patient's description of current relationship with people who raised him/her: Father is in [place]. Pt talks to him on the phone sometimes. Mother yells at him. Still sees and talks to stepfather. Does patient have siblings?: Yes Number of Siblings: 1 (brother) Description of patient's current relationship with siblings: "Alright" relationship with brother who shares mother. States he has a lot of brothers/sisters on his father's side, does not talk to them. Did patient suffer any verbal/emotional/physical/sexual abuse as a child?: No Did patient suffer from severe childhood neglect?: No Has patient ever been sexually abused/assaulted/raped as an adolescent or adult?: No Was the patient ever a victim of a crime or a disaster?: No Witnessed domestic violence?: No Has patient been effected by domestic violence as an adult?: No Description of domestic violence: N/A  Education:  Highest grade of school patient has completed: 11th  Currently a Consulting civil engineer?: No Learning disability?: No  Employment/Work Situation:  Employment situation: Unemployed What is the longest time patient has a held a job?: 2 years Where was the patient employed at that time?: [place] Has patient ever been in the [employer]?: No Has patient ever served in Buyer, retail?: No  Financial Resources:  Surveyor, quantity resources: Support from parents / caregiver, Medicaid, Food stamps Does patient have a Lawyer or guardian?: No  Alcohol/Substance Abuse:  What has been your  use of drugs/alcohol within the last 12 months?: Rare marijuana use. Will use cocaine powder or crack maybe 2 times a month. If  attempted suicide, did drugs/alcohol play a role in this?: No Alcohol/Substance Abuse Treatment Hx: Past Tx, Outpatient If yes, describe treatment: Goes to a substance abuse class in [place]. Has alcohol/substance abuse ever caused legal problems?: No  Social Support System:  Patient's Community Support System: Good Describe Community Support System: Brother, brother's girlfriend, mother, friends, uncle, grandmother Type of faith/religion: Ephriam Knuckles How does patient's faith help to cope with current illness?: Chief Operating Officer:  Leisure and Hobbies: Plays cards, mow yards, pick up trash, take out trash, wash cars, walking  Strengths/Needs:  What things does the patient do well?: Playing video games, shooting basketball when his back/feet/legs don't hurt, playing cards. In what areas does patient struggle / problems for patient: Being a father figure  Discharge Plan:  Does patient have access to transportation?: No Plan for no access to transportation at discharge: Will need a bus pass to get home. Will patient be returning to same living situation after discharge?: Yes Currently receiving community mental health services: Yes (From Whom) (Dr. Howell Rucks, [facility], in [place] - med mgmt) If no, would patient like referral for services when discharged?: Yes (What county?) (Wants to go back to see Dr. Fannie Knee as well as to pain clinic) Does patient have financial barriers related to discharge medications?: Yes Patient description of barriers related to discharge medications: Has Medicaid but no income  Summary/Recommendations:  Cole [name] is a 34yo male hospitalized with SI originally without a plan, then with a plan to overdose. He lives in [place] with mother, grandmother, has Medicaid and states will need transportation home. He goes to [facility] for mental health, was with [facility] but states his UDS  showed substances that he does not remember doing. He was unsure about signing a release for [facility], but did sign for [place]. The patient would benefit from safety monitoring, medication evaluation, psychoeducation, group therapy, and discharge planning to link with ongoing resources. The patient refused referral to [facility] for smoking cessation. The Discharge Process and Patient Involvement form was reviewed with patient at the end of the Psychosocial Assessment, and the patient confirmed understanding and signed that document, which was placed in the paper chart. Suicide Prevention Education was reviewed thoroughly, and a brochure left with patient.   Smart, Marciana Uplinger LCSWA 05/01/2015 8:53 AM

## 2015-05-01 NOTE — ED Notes (Signed)
Patient was transported to [facility] via [facility], vital signs stable. Gave report to Zelda at [facility].

## 2015-05-01 NOTE — Progress Notes (Signed)
D) Pt spent a good part of the morning in his bed and would not get up for his medications. Affect is flat and mood depressed. Pt did get up and did not go to the groups. Wanted to make phone calls. States he is feeling hopeless. Presently denies SI and HI. A) Given support, reassurance and praise. Encouragement provided. R) Presently denies SI and HI. Affect is flat and mood depressed.

## 2015-05-01 NOTE — ED Notes (Signed)
Heated a meal for patient and gave it to him.

## 2015-05-01 NOTE — Tx Team (Signed)
Interdisciplinary Treatment Plan Update (Adult)  Date:  05/01/2015  Time Reviewed:  8:53 AM   Progress in Treatment: Attending groups: Yes. Participating in groups:  Yes. Taking medication as prescribed:  Yes. Tolerating medication:  Yes. Family/Significant othe contact made:  SPE required for this pt.  Patient understands diagnosis:  Yes. and As evidenced by:  seeking treatment for depression, substance abuse, SI, and medication stabilization.  Discussing patient identified problems/goals with staff:  Yes. Medical problems stabilized or resolved:  Yes. Denies suicidal/homicidal ideation: Yes. Issues/concerns per patient self-inventory:  Other:  New problem(s) identified:  Guarded; minimal information provided by pt.   Discharge Plan or Barriers: Pt plans to return home where he lives with his mother and grandmother. He plans to continue followup with current providers---CSW assessing. His long term goal is to move out of his home and get a place of his own, however pt has no money and no employment at this time.   Reason for Continuation of Hospitalization: Depression Medication stabilization Withdrawal symptoms  Comments:  Cole [name] is a 34 y.o. male who voluntarily presents to [facility] with SI/depression. Pt states that he ran out of pain meds(oxycodone). Pt reports the following: he has been SI x2 days and has no plan or intent to harm himself, stating that he has argument with his family members and he feels homeless because he is living with multiple relatives. He is having a difficult time finding a job and has financial issues and he is also trying to secure disability. Pt denies HI/AVH but states that he used marijuana laced with cocaine 2 days ago at a party and also used "molly's" at the party. Pt has a hx of SI attempts x2 by cutting his wrists. Pt stated that he didn't feel safe going home at present but would eventually be able to return to his relatives home.Axis  I: Post Traumatic Stress Disorder and Opioid use disorder; Panic disorder; Stimulant use disorder  Estimated length of stay:  3-5 days   New goal(s): to formulate effective after care plan. Pt was at [facility]/16 and had followup at [facility], [facility], and [facility].   Additional Comments:  Patient and CSW reviewed pt's identified goals and treatment plan. Patient verbalized understanding and agreed to treatment plan. CSW reviewed [facility] "Discharge Process and Patient Involvement" Form. Pt verbalized understanding of information provided and signed form.    Review of initial/current patient goals per problem list:  1. Goal(s): Patient will participate in aftercare plan  Met: No.   Target date: at discharge  As evidenced by: Patient will participate within aftercare plan AEB aftercare provider and housing plan at discharge being identified.  9/1: CSW assessing. Appts needed with current providers.   2. Goal (s): Patient will exhibit decreased depressive symptoms and suicidal ideations.  Met: No.    Target date: at discharge  As evidenced by: Patient will utilize self rating of depression at 3 or below and demonstrate decreased signs of depression or be deemed stable for discharge by MD.  9/1: Pt rates depression as high today and presents with depressed mood and irritable affect.   3. Goal(s): Patient will demonstrate decreased signs of withdrawal due to substance abuse  Met: No.   Target date:at discharge   As evidenced by: Patient will produce a CIWA/COWS score of 0, have stable vitals signs, and no symptoms of withdrawal.    9/1: Pt reports minimal withdrawals with COWS of 1.  Stable vitals today.   Attendees: Patient:   05/01/2015 8:53 AM   Family:   05/01/2015 8:53 AM   Physician:  Dr. Carlton Adam, MD 05/01/2015 8:53 AM   Nursing:   Minerva Areola RN 05/01/2015 8:53 AM   Clinical Social Worker: Maxie Better, [place]   05/01/2015 8:53 AM   Clinical Social Worker: Erasmo Downer Drinkard LCSWA; Peri Maris LCSWA 05/01/2015 8:53 AM   Other:  Gerline Legacy Nurse Case Manager 05/01/2015 8:53 AM   Other:  Lucinda Dell; [facility]  05/01/2015 8:53 AM   Other:   05/01/2015 8:53 AM   Other:  05/01/2015 8:53 AM   Other:  05/01/2015 8:53 AM   Other:  05/01/2015 8:53 AM    05/01/2015 8:53 AM    05/01/2015 8:53 AM    05/01/2015 8:53 AM    05/01/2015 8:53 AM    Scribe for Treatment Team:   Maxie Better, [place]  05/01/2015 8:53 AM

## 2015-05-01 NOTE — BHH Group Notes (Signed)
BHH LCSW Group Therapy  05/01/2015 12:55 PM  Type of Therapy:  Group Therapy  Participation Level:  Did Not Attend-pt invited/chose to remain in room.   Modes of Intervention:  Confrontation, Discussion, Education, Exploration, Problem-solving, Rapport Building, Socialization and Support  Summary of Progress/Problems:  Finding Balance in Life. Today's group focused on defining balance in one's own words, identifying things that can knock one off balance, and exploring healthy ways to maintain balance in life. Group members were asked to provide an example of a time when they felt off balance, describe how they handled that situation,and process healthier ways to regain balance in the future. Group members were asked to share the most important tool for maintaining balance that they learned while at [facility] and how they plan to apply this method after discharge.   Smart, Machaela Caterino LCSWA 05/01/2015, 12:55 PM

## 2015-05-01 NOTE — BHH Suicide Risk Assessment (Signed)
[facility] Admission Suicide Risk Assessment   Nursing information obtained from:  Patient Demographic factors:  Male, Low socioeconomic status, Unemployed Current Mental Status:  Suicidal ideation indicated by patient Loss Factors:  Decrease in vocational status, Financial problems / change in socioeconomic status Historical Factors:  Family history of suicide, Impulsivity Risk Reduction Factors:  Living with another person, especially a relative, Positive social support, Positive coping skills or problem solving skills Total Time spent with patient: 45 minutes Principal Problem: Substance induced mood disorder Diagnosis:   Patient Active Problem List   Diagnosis Date Noted  . Polysubstance dependence including opioid type drug, continuous use [F11.229] 05/01/2015  . Substance induced mood disorder [F19.94] 05/01/2015  . Hepatitis C reactive [R89.4] 01/22/2015  . PTSD (post-traumatic stress disorder) [F43.10] 01/20/2015  . Panic disorder [F41.0] 01/20/2015  . 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 Opioid use disorder, moderate, in controlled environment [F11.90] 01/20/2015  . 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 Hx of schizophrenia [Z86.59] 01/20/2015  . Chronic pain [G89.29] 10/17/2011  . Failed vision screen 05/12/2011     Continued Clinical Symptoms:  Alcohol Use Disorder Identification Test Final Score (AUDIT): 0 The "Alcohol Use Disorders Identification Test", Guidelines for Use in Primary Care, Second Edition.  World Science writer [facility]). Score between 0-7:  no or low risk or alcohol related problems. Score between 8-15:  moderate risk of alcohol related problems. Score between 16-19:  high risk of alcohol related problems. Score 20 or above:  warrants further diagnostic evaluation for alcohol dependence and  treatment.   CLINICAL FACTORS:   Alcohol/Substance Abuse/Dependencies   Psychiatric Specialty Exam: Physical Exam  ROS  Blood pressure 137/76, pulse 81, temperature 97.4 F (36.3 C), temperature source Oral, resp. rate 18, height  (1.753 m), weight 95.255 kg (210 lb).Body mass index is 31 kg/(m^2).     COGNITIVE FEATURES THAT CONTRIBUTE TO RISK:  Closed-mindedness, Polarized thinking and Thought constriction (tunnel vision)    SUICIDE RISK:   Moderate:  Frequent suicidal ideation with limited intensity, and duration, some specificity in terms of plans, no associated intent, good self-control, limited dysphoria/symptomatology, some risk factors present, and identifiable protective factors, including available and accessible social support.  PLAN OF CARE: See Admission H and PE  Medical Decision Making:  Review of Psycho-Social Stressors (1), Review or order clinical lab tests (1), Review of Medication Regimen & Side Effects (2) and Review of New Medication or Change in Dosage (2)  I certify that inpatient services furnished can reasonably be expected to improve the patient's condition.   Tayli Buch A 05/01/2015, 7:39 PM

## 2015-05-01 NOTE — Progress Notes (Signed)
BHH Group Notes:  (Nursing/MHT/Case Management/Adjunct)  Date:  05/01/2015  Time:  2045  Type of Therapy:  wrap up group  Participation Level:  Active  Participation Quality:  Appropriate, Attentive, Sharing and Supportive  Affect:  Blunted  Cognitive:  Appropriate  Insight:  Appropriate  Engagement in Group:  Engaged  Modes of Intervention:  Clarification, Education and Support  Summary of Progress/Problems: Pt reports feeling good about being able to talk to the Dr about his problems. Pt shares that he is wanting to find a job and trying to get disability. Pt likes his living arrangement  in [place] but reports not having transportation and only fast food jobs around. Pt would consider moving to a more populated area and live in a shelter or a halfway house.   Shelah Lewandowsky 05/01/2015, 10:57 PM

## 2015-05-01 NOTE — Progress Notes (Signed)
Elohim has been admitted and oriented to the unit. During his assessment his affect is flat and he appears solemn and depressed. Rates Depression 7-8/10, Anxiety 7/10 and chronic back pain 8/10. His chronic back pain has been ongoing for over 7 years and he is seen at [facility] clinic for treatment. He uses a cane for ambulation however he states he left his cane in a friend's care and has not been able to use it. States he sometimes feels unsteady without his cane. He lives with his grandmother who is very supportive however he states when he becomes frustrated and depressed he feels he can not talk to her. His stressors are lack of finances (he is unemployed) and his current living situation. He states he wishes he could afford to live on his own. He endorses feelings of hopelessness, nervousness, shakiness, insomnia (trouble staying and falling asleep) and decreased concentration. Although he denies alcohol use, he endorses abusing marijuana and cocaine for over 14 years. He denies SI currently (states it comes and goes) HI, AVH. He contracts for safety.  A: Encouraged Jonathon to focus on ways to identify triggers to his depression while he is here.  R: Will continue to monitor for patient safety.
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
## 2015-05-01 NOTE — Progress Notes (Signed)
D: Tell has many requests tonight. His affect is flat, voice is low and soft but he is pleasant during our conversation. He states his pain is still moderate 6/10 despite taking ultram. He requests a SW consult to possibly help him try to locate a job after he is discharged as well as locate a dentist for his tooth which needs to be repaired. He would also like to receive a note once he is discharged with his dates of admission while he was here. He describes his day as "Happy". He plans to follow up with his pain management clinic on September 29th.  He attended group tonight. A:  Encouraged Keenon to focus on meeting his goals while he is here which include more effective communication and better coping skills.  R: Will continue to monitor for patient safety and medication effectiveness.

## 2015-05-01 NOTE — ED Provider Notes (Signed)
Terry, TSS called and states patient has been accepted at [facility] by Dr Dub Mikes  Patient's resting quietly in his room. He states he is "stable" when asked how he is feeling. He is informed that he has been accepted to behavioral health and he is agreeable to being transferred.  Devoria Albe, MD, Concha Pyo, MD 05/01/15 [phone]

## 2015-05-02 MED ORDER — NICOTINE POLACRILEX 2 MG MT GUM
2.0000 mg | CHEWING_GUM | OROMUCOSAL | Status: DC | PRN
  Administered 2015-05-01: 16:00:00 via ORAL
  Administered 2015-05-02 (×2): 2 mg via ORAL
  Filled 2015-05-02 (×2): qty 1

## 2015-05-02 MED ORDER — CLONIDINE HCL 0.1 MG PO TABS: 0.1000 mg | ORAL_TABLET | Freq: Two times a day (BID) | ORAL | Status: DC

## 2015-05-02 MED ORDER — BENZOCAINE 10 % MT GEL: Freq: Four times a day (QID) | OROMUCOSAL | Status: DC | PRN

## 2015-05-02 MED ORDER — TRAMADOL HCL 50 MG PO TABS
100.0000 mg | ORAL_TABLET | Freq: Four times a day (QID) | ORAL | Status: DC | PRN
  Administered 2015-05-02 – 2015-05-03 (×2): 100 mg via ORAL
  Filled 2015-05-02 (×2): qty 2

## 2015-05-02 MED ORDER — ZOLPIDEM TARTRATE 10 MG PO TABS
10.0000 mg | ORAL_TABLET | Freq: Every evening | ORAL | Status: DC | PRN
  Administered 2015-05-02: 10 mg via ORAL
  Filled 2015-05-02: qty 1

## 2015-05-02 NOTE — [facility] Suicide Risk Assessment (Signed)
[facility] INPATIENT:  Family/Significant Other Suicide Prevention Education  Suicide Prevention Education:  Contact Attempts: Consuelo Pandy (pt's mother) [phone] has been identified by the patient as the family member/significant other with whom the patient will be residing, and identified as the person(s) who will aid the patient in the event of a mental health crisis.  With written consent from the patient, two attempts were made to provide suicide prevention education, prior to and/or following the patient's discharge.  We were unsuccessful in providing suicide prevention education.  A suicide education pamphlet was given to the patient to share with family/significant other.  Date and time of first attempt: 05/01/15 at 4:30PM  Date and time of second attempt: 05/02/15 at 11:10AM (requested call back)   Smart, Erdine Hulen LCSWA  05/02/2015, 11:11 AM

## 2015-05-02 NOTE — [facility] Group Notes (Signed)
[facility] LCSW Aftercare Discharge Planning Group Note   05/02/2015 11:07 AM  Participation Quality:  Appropriate   Mood/Affect:  Appropriate  Depression Rating:  4  Anxiety Rating:  0  Thoughts of Suicide:  No Will you contract for safety?   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 Pt is relieved that mother found his wallet and reports good mood and no withdrawals today. Pt refusing to sign consents and does not want [place] to send medical records to his current [facility] and [employer].   Transportation Means: [facility] transport needed for 9pm pickup on Saturday 05/03/15  Supports: mother ; some friends   Counselling psychologist, Oncologist

## 2015-05-02 NOTE — [facility] Group Notes (Signed)
[facility] LCSW Group Therapy  05/02/2015 11:49 AM  Type of Therapy:  Group Therapy  Participation Level:  None  Participation Quality:  Inattentive  Modes of Intervention:  Confrontation, Discussion, Education, Exploration, Problem-solving, Rapport Building, Socialization and Support  Summary of Progress/Problems: Feelings around Relapse. Group members discussed the meaning of relapse and shared personal stories of relapse, how it affected them and others, and how they perceived themselves during this time. Group members were encouraged to identify triggers, warning signs and coping skills used when facing the possibility of relapse. Social supports were discussed and explored in detail. Post Acute Withdrawal Syndrome (handout provided) was introduced and examined. Pt's were encouraged to ask questions, talk about key points associated with PAWS, and process this information in terms of relapse prevention. Cole [name] came to group but had to be redirected due to his frequent interruptions to ask about personal matters. Cole [name] left after 10 minutes with NP and did not return. At this time, he continues to struggle in the group setting and demonstrates a lack of insight.   [name], Cole Keil LCSWA 05/02/2015, 11:49 AM

## 2015-05-02 NOTE — Progress Notes (Signed)
Patient attended the AA group meeting.  
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
## 2015-05-02 NOTE — Progress Notes (Signed)
DAR Note: Cole [name] has been at the nurse station much of the day.  He c/o of foot pain and wants an ace wrap, toe pain from ingrown toenail, tooth pain and tongue pain.  He requested something for his pain and took Ultram with no relief.  He stated that he is allergic to ultram.  Reminded him that he has been taking the medication for a few days.  He then requested to have the ultram increased to 100 mg.  He also stated that he would like some hydrocodone.  Informed him that he would need to discuss with doctor.  Talked with NP and she states that she would increase his ultram.  He complained that his tongue was swelling.  RN assessed but his tongue was normal and he went to the cafeteria to eat without difficulty.  He denied SI/HI or A/V hallucinations.  He would become irritable when he wasn't able to get his medications that he is requesting.  Orajel was ordered for his tooth pain, but when we were able to get the medication from the pharmacy, he stated that he was allergic to Orajel.  Offered heat pack and he took with minimal relief.  He attended groups but was disruptive.  Encouraged him to participate in group/unit activities.  Q 15 minute checks maintained for safety.
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
## 2015-05-02 NOTE — Progress Notes (Signed)
  [facility] Adult Case Management Discharge Plan :  Will you be returning to the same living situation after discharge:  Yes,  home with mother and grandmother At discharge, do you have transportation home?: Yes,  Juel Burrow will pick up pt at 9:00PM on Saturday 05/03/15 and transport him to [facility]. From there, pt's mother will take pt home. Do you have the ability to pay for your medications: Yes,  medicaid  Release of information consent forms completed and submitted to medical records by CSW-pt refused to sign  05/02/2015 11:14 AM  .  Patient to Follow up at: Follow-up Information    Follow up with Patient plans to secure his own follow-up and DOES NOT want records sent to any providers. .      Patient denies SI/HI: Yes,  during group/self report.    Safety Planning and Suicide Prevention discussed: Yes,  SPE completed with pt. Contact attempts made with pt's mother. SPI pamphlet also provided to pt and he was encouraged to share this with his support network.   Have you used any form of tobacco in the last 30 days? (Cigarettes, Smokeless Tobacco, Cigars, and/or Pipes): Yes  Has patient been referred to the Quitline?: Patient refused referral  Smart, Lebron Quam  05/02/2015, 11:12 AM

## 2015-05-03 ENCOUNTER — Encounter (HOSPITAL_COMMUNITY): Payer: Self-pay | Admitting: Registered Nurse

## 2015-05-03 DIAGNOSIS — F1994 Other psychoactive substance use, unspecified with psychoactive substance-induced mood disorder: Secondary | ICD-10-CM

## 2015-05-03 MED ORDER — TRAZODONE HCL 100 MG PO TABS: 100.0000 mg | ORAL_TABLET | Freq: Every evening | ORAL | Status: DC | PRN

## 2015-05-03 MED ORDER — CYCLOBENZAPRINE HCL 10 MG PO TABS: 10.0000 mg | ORAL_TABLET | Freq: Three times a day (TID) | ORAL | Status: DC | PRN

## 2015-05-03 MED ORDER — HYDROXYZINE HCL 50 MG PO TABS: 50.0000 mg | ORAL_TABLET | Freq: Every day | ORAL | Status: DC | PRN

## 2015-05-03 MED ORDER — GABAPENTIN 400 MG PO CAPS: 400.0000 mg | ORAL_CAPSULE | Freq: Three times a day (TID) | ORAL | Status: DC

## 2015-05-03 MED ORDER — TRAMADOL HCL 50 MG PO TABS: 50.0000 mg | ORAL_TABLET | Freq: Three times a day (TID) | ORAL | Status: DC | PRN

## 2015-05-03 MED ORDER — ALBUTEROL SULFATE HFA 108 (90 BASE) MCG/ACT IN AERS: 2.0000 | INHALATION_SPRAY | Freq: Four times a day (QID) | RESPIRATORY_TRACT | Status: DC | PRN

## 2015-05-03 MED ORDER — ADULT MULTIVITAMIN W/MINERALS CH: 1.0000 | ORAL_TABLET | Freq: Every day | ORAL | Status: DC

## 2015-05-03 MED ORDER — BENZOCAINE 10 % MT GEL: Freq: Four times a day (QID) | OROMUCOSAL | Status: DC | PRN

## 2015-05-03 NOTE — Progress Notes (Signed)
Patient ID: Cole [name], male   DOB: April 19, 1981, 34 y.o.   MRN: [id number] D: Patient in dayroom sitting on the table on approach. Pt wanted to know if hydrocodone was ordered and requested  for his back pain. Medication was not ordered but other alternative was offered. Pt rated pain as 8 on a 0-10 scale. Pt denies SI/HI/AVH. Pt was in and out of AA group this evening. Cooperative with assessment.   A: Met with pt 1:1. Medications administered as prescribed. Support and encouragement provided to attend groups and engage in milieu. Pt encouraged to discuss feelings and come to staff with any question or concerns.   R: Patient remains safe and complaint with medications. Pt report no relieve with back pain, rated as 7 on a 0-10 scale.

## 2015-05-03 NOTE — [facility] Suicide Risk Assessment (Signed)
[facility] Discharge Suicide Risk Assessment   Demographic Factors:  Low socioeconomic status  Total Time spent with patient: 30 minutes  Musculoskeletal: Strength & Muscle Tone: within normal limits Gait & Station: normal Patient leans: N/A  Psychiatric Specialty Exam: Physical Exam  ROS  Blood pressure 140/81, pulse 80, temperature 99.2 F (37.3 C), temperature source Oral, resp. rate 16, height  (1.753 m), weight 95.255 kg (210 lb).Body mass index is 31 kg/(m^2).  General Appearance: Casual  Eye Contact::  Good  Speech:  Normal Rate409  Volume:  Normal  Mood:  Anxious  Affect:  Appropriate  Thought Process:  Coherent  Orientation:  Full (Time, Place, and Person)  Thought Content:  WDL  Suicidal Thoughts:  No  Homicidal Thoughts:  No  Memory:  Immediate;   Fair Recent;   Fair Remote;   Fair  Judgement:  Fair  Insight:  Fair  Psychomotor Activity:  Decreased  Concentration:  Fair  Recall:  [employer] of Knowledge:Fair  Language: Fair  Akathisia:  No  Handed:  Right  AIMS (if indicated):     Assets:  Communication Skills Desire for Improvement Housing Physical Health Resilience Social Support  Sleep:  Number of Hours: 4.5  Cognition: WNL  ADL's:  Intact   Have you used any form of tobacco in the last 30 days? (Cigarettes, Smokeless Tobacco, Cigars, and/or Pipes): Yes  Has this patient used any form of tobacco in the last 30 days? (Cigarettes, Smokeless Tobacco, Cigars, and/or Pipes) No  Mental Status Per Nursing Assessment::   On Admission:  Suicidal ideation indicated by patient  Current Mental Status by Physician: see above  Loss Factors: Financial problems/change in socioeconomic status  Historical Factors: Family history of mental illness or substance abuse  Risk Reduction Factors:   Responsible for children under 65 years of age, Sense of responsibility to family, Religious beliefs about death, Living with another person, especially a relative,  Positive social support, Positive therapeutic relationship and Positive coping skills or problem solving skills  Continued Clinical Symptoms:  Alcohol/Substance Abuse/Dependencies Previous Psychiatric Diagnoses and Treatments  Cognitive Features That Contribute To Risk:  None    Suicide Risk:  Minimal: No identifiable suicidal ideation.  Patients presenting with no risk factors but with morbid ruminations; may be classified as minimal risk based on the severity of the depressive symptoms  Principal Problem: Substance induced mood disorder Discharge Diagnoses:  Patient Active Problem List   Diagnosis Date Noted  . Polysubstance dependence including opioid type drug, continuous use [F11.229] 05/01/2015  . Substance induced mood disorder [F19.94] 05/01/2015  . Hepatitis C reactive [R89.4] 01/22/2015  . PTSD (post-traumatic stress disorder) [F43.10] 01/20/2015  . Panic disorder [F41.0] 01/20/2015  . 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 Opioid use disorder, moderate, in controlled environment [F11.90] 01/20/2015  . 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 Hx of schizophrenia [Z86.59] 01/20/2015  . Chronic pain [G89.29] 10/17/2011  . Failed vision screen 05/12/2011    Follow-up Information    Follow up with Patient plans to secure his own follow-up and DOES NOT want records sent to any providers. .      Plan Of Care/Follow-up recommendations:  Activity:  As tolerated Diet:  Unchanged from the past  Is patient on multiple antipsychotic therapies at discharge:  No   Has Patient had three or more failed trials of antipsychotic monotherapy by  history:  No  Recommended Plan for Multiple Antipsychotic Therapies: NA    Angell Pincock T. 05/03/2015, 9:27 AM

## 2015-05-03 NOTE — [facility] Group Notes (Signed)
[facility] LCSW Group Therapy  05/03/2015   10 AM  Type of Therapy:  Group Therapy  Participation Level:  Minimal  Participation Quality:  Inattentive  Affect:  Flat  Insight:  None shared  Engagement in Therapy:  None  Modes of Intervention:  Limit-setting and Socialization   Summary of Progress/Problems: The main focus of today's process group was for the patient to identify ways in which they have in the past sabotaged their own recovery. Motivational Interviewing was utilized to ask the group members what they get out of their substance use, and what reasons they may have for wanting to change. The Stages of Change were explained using a handout, and patients identified where they currently are with regard to stages of change. Patient appeared to be trying to clean his toenails with lid of a soda bottle and was asked to go to nursing station to get proper tool and do task in his room. Patient returned shortly before group ended and sat on piano bench with arms hitting notes on piano; patient appeared oblivious of noise he was creating.    Cole [name]
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
## 2015-05-03 NOTE — Plan of Care (Signed)
Problem: Diagnosis: Increased Risk For Suicide Attempt Goal: STG-Patient Will Comply With Medication Regime Outcome: Progressing Pt compliant with medication regime     

## 2015-05-04 ENCOUNTER — Encounter (HOSPITAL_COMMUNITY): Payer: Self-pay | Admitting: *Deleted

## 2015-05-04 ENCOUNTER — Emergency Department (HOSPITAL_COMMUNITY)
Admission: EM | Admit: 2015-05-04 | Discharge: 2015-05-04 | Disposition: A | Discharge status: Home / Self Care | Payer: Medicaid Other | Attending: Emergency Medicine | Admitting: Emergency Medicine

## 2015-05-04 DIAGNOSIS — Z720 Tobacco use: Secondary | ICD-10-CM | POA: Diagnosis not present

## 2015-05-04 DIAGNOSIS — G8929 Other chronic pain: Secondary | ICD-10-CM | POA: Insufficient documentation

## 2015-05-04 DIAGNOSIS — G629 Polyneuropathy, unspecified: Secondary | ICD-10-CM | POA: Diagnosis not present

## 2015-05-04 DIAGNOSIS — F419 Anxiety disorder, unspecified: Secondary | ICD-10-CM | POA: Insufficient documentation

## 2015-05-04 DIAGNOSIS — J45909 Unspecified asthma, uncomplicated: Secondary | ICD-10-CM | POA: Diagnosis not present

## 2015-05-04 DIAGNOSIS — F329 Major depressive disorder, single episode, unspecified: Secondary | ICD-10-CM | POA: Diagnosis not present

## 2015-05-04 DIAGNOSIS — Z79899 Other long term (current) drug therapy: Secondary | ICD-10-CM | POA: Insufficient documentation

## 2015-05-04 DIAGNOSIS — G4700 Insomnia, unspecified: Secondary | ICD-10-CM | POA: Diagnosis not present

## 2015-05-04 DIAGNOSIS — Z87828 Personal history of other (healed) physical injury and trauma: Secondary | ICD-10-CM | POA: Diagnosis not present

## 2015-05-04 DIAGNOSIS — K120 Recurrent oral aphthae: Secondary | ICD-10-CM

## 2015-05-04 DIAGNOSIS — Z8619 Personal history of other infectious and parasitic diseases: Secondary | ICD-10-CM | POA: Diagnosis not present

## 2015-05-04 DIAGNOSIS — K088 Other specified disorders of teeth and supporting structures: Secondary | ICD-10-CM | POA: Diagnosis present

## 2015-05-04 MED ORDER — LIDOCAINE VISCOUS 2 % MT SOLN
15.0000 mL | Freq: Once | OROMUCOSAL | Status: AC
  Administered 2015-05-04: 15 mL via OROMUCOSAL
  Filled 2015-05-04: qty 15

## 2015-05-04 MED ORDER — HYDROCODONE-ACETAMINOPHEN 5-325 MG PO TABS
1.0000 | ORAL_TABLET | Freq: Once | ORAL | Status: AC
  Administered 2015-05-04: 1 via ORAL
  Filled 2015-05-04: qty 1

## 2015-05-04 NOTE — ED Notes (Signed)
Pt c/o tongue irritation to left side of mouth that started x 3 days ago

## 2015-05-04 NOTE — Discharge Instructions (Signed)
Oral Ulcers Oral ulcers are painful, shallow sores around the lining of the mouth. They can affect the gums, the inside of the lips, and the cheeks. (Sores on the outside of the lips and on the face are different.) They typically first occur in school-aged children and teenagers. Oral ulcers may also be called canker sores or cold sores. CAUSES  Canker sores and cold sores can be caused by many factors including:  Infection.  Injury.  Sun exposure.  Medications.  Emotional stress.  Food allergies.  Vitamin deficiencies.  Toothpastes containing sodium lauryl sulfate. The herpes virus can be the cause of mouth ulcers. The first infection can be severe and cause 10 or more ulcers on the gums, tongue, and lips with fever and difficulty in swallowing. This infection usually occurs between the ages of 75 and 3 years.  SYMPTOMS  The typical sore is about  inch (6 mm) in size and is an oval or round ulcer with red borders. DIAGNOSIS  Your caregiver can diagnose simple oral ulcers by examination. Additional testing is usually not required.  TREATMENT  Treatment is aimed at pain relief. Generally, oral ulcers resolve by themselves within 1 to 2 weeks without medication and are not contagious unless caused by herpes (and other viruses). Antibiotics are not effective with mouth sores. Avoid direct contact with others until the ulcer is completely healed. See your caregiver for follow-up care as recommended. Also:  Offer a soft diet.  Encourage plenty of fluids to prevent dehydration. Popsicles and milk shakes can be helpful.  Avoid acidic and salty foods and drinks such as orange juice.  Infants and young children will often refuse to drink because of pain. Using a teaspoon, cup, or syringe to give small amounts of fluids frequently can help prevent dehydration.  Cold compresses on the face may help reduce pain.  Pain medication can help control soreness.  A solution of diphenhydramine  mixed with a liquid antacid can be useful to decrease the soreness of ulcers. Consult a caregiver for the dosing.  Liquids or ointments with a numbing ingredient may be helpful when used as recommended.  Older children and teenagers can rinse their mouth with a salt-water mixture (1/2 teaspoon of salt in 8 ounces of water) four times a day. This treatment is uncomfortable but may reduce the time the ulcers are present.  There are many over-the-counter throat lozenges and medications available for oral ulcers. Their effectiveness has not been studied.  Consult your medical caregiver prior to using homeopathic treatments for oral ulcers. SEEK MEDICAL CARE IF:   You think your child needs to be seen.  The pain worsens and you cannot control it.  There are 4 or more ulcers.  The lips and gums begin to bleed and crust.  A single mouth ulcer is near a tooth that is causing a toothache or pain.  Your child has a fever, swollen face, or swollen glands.  The ulcers began after starting a medication.  Mouth ulcers keep reoccurring or last more than 2 weeks.  You think your child is not taking adequate fluids. SEEK IMMEDIATE MEDICAL CARE IF:   Your child has a high fever.  Your child is unable to swallow or becomes dehydrated.  Your child looks or acts very ill.  An ulcer caused by a chemical your child accidentally put in their mouth. Document Released: 09/23/2004 Document Revised: 12/31/2013 Document Reviewed: 05/08/2009 [facility] Patient Information 2015 [place], [place]. This information is not intended to replace advice  given to you by your health care provider. Make sure you discuss any questions you have with your health care provider.  I suspect your tongue ulcer came from rubbing the tongue against your sharp tooth edge.  Use the medicine given, you may apply a dab using a qtip every 4 hours if needed for pain relief.  Followup with your primary doctor as planned.  Avoid eating  salty, spicy or acidic foods (orange juice, lemon, tomato based foods).
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
## 2015-06-01 ENCOUNTER — Encounter (HOSPITAL_COMMUNITY): Payer: Self-pay | Admitting: Emergency Medicine

## 2015-06-01 ENCOUNTER — Emergency Department (HOSPITAL_COMMUNITY)
Admission: EM | Admit: 2015-06-01 | Discharge: 2015-06-01 | Disposition: A | Discharge status: Home / Self Care | Payer: Medicaid Other | Attending: Emergency Medicine | Admitting: Emergency Medicine

## 2015-06-01 DIAGNOSIS — F329 Major depressive disorder, single episode, unspecified: Secondary | ICD-10-CM | POA: Diagnosis not present

## 2015-06-01 DIAGNOSIS — Z87828 Personal history of other (healed) physical injury and trauma: Secondary | ICD-10-CM | POA: Diagnosis not present

## 2015-06-01 DIAGNOSIS — J45909 Unspecified asthma, uncomplicated: Secondary | ICD-10-CM | POA: Insufficient documentation

## 2015-06-01 DIAGNOSIS — K029 Dental caries, unspecified: Secondary | ICD-10-CM | POA: Diagnosis not present

## 2015-06-01 DIAGNOSIS — G629 Polyneuropathy, unspecified: Secondary | ICD-10-CM | POA: Insufficient documentation

## 2015-06-01 DIAGNOSIS — Z720 Tobacco use: Secondary | ICD-10-CM | POA: Insufficient documentation

## 2015-06-01 DIAGNOSIS — K0889 Other specified disorders of teeth and supporting structures: Secondary | ICD-10-CM | POA: Diagnosis present

## 2015-06-01 DIAGNOSIS — Z8619 Personal history of other infectious and parasitic diseases: Secondary | ICD-10-CM | POA: Diagnosis not present

## 2015-06-01 DIAGNOSIS — Z79899 Other long term (current) drug therapy: Secondary | ICD-10-CM | POA: Insufficient documentation

## 2015-06-01 DIAGNOSIS — F419 Anxiety disorder, unspecified: Secondary | ICD-10-CM | POA: Insufficient documentation

## 2015-06-01 DIAGNOSIS — G8929 Other chronic pain: Secondary | ICD-10-CM | POA: Insufficient documentation

## 2015-06-01 DIAGNOSIS — G4700 Insomnia, unspecified: Secondary | ICD-10-CM | POA: Diagnosis not present

## 2015-06-01 MED ORDER — OXYCODONE HCL 5 MG PO TABS
5.0000 mg | ORAL_TABLET | Freq: Once | ORAL | Status: AC
  Administered 2015-06-01: 5 mg via ORAL
  Filled 2015-06-01: qty 1

## 2015-06-01 NOTE — ED Notes (Signed)
Have dental appointment tomorrow at 1000 am.  Need pain medication until tomorrow.  Rates pain 8/10.
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
## 2015-06-01 NOTE — Discharge Instructions (Signed)
Dental Caries °Dental caries is tooth decay. This decay can cause a hole in teeth (cavity) that can get bigger and deeper over time. °HOME CARE °· Brush and floss your teeth. Do this at least two times a day. °· Use a fluoride toothpaste. °· Use a mouth rinse if told by your dentist or doctor. °· Eat less sugary and starchy foods. Drink less sugary drinks. °· Avoid snacking often on sugary and starchy foods. Avoid sipping often on sugary drinks. °· Keep regular checkups and cleanings with your dentist. °· Use fluoride supplements if told by your dentist or doctor. °· Allow fluoride to be applied to teeth if told by your dentist or doctor. °Document Released: 05/25/2008 Document Revised: 12/31/2013 Document Reviewed: 08/18/2012 °ExitCare® Patient Information ©2015 [employer]. This information is not intended to replace advice given to you by your health care provider. Make sure you discuss any questions you have with your health care provider. ° °Dental Pain °A tooth ache may be caused by cavities (tooth decay). Cavities expose the nerve of the tooth to air and hot or cold temperatures. It may come from an infection or abscess (also called a boil or furuncle) around your tooth. It is also often caused by dental caries (tooth decay). This causes the pain you are having. °DIAGNOSIS  °Your caregiver can diagnose this problem by exam. °TREATMENT  °· If caused by an infection, it may be treated with medications which kill germs (antibiotics) and pain medications as prescribed by your caregiver. Take medications as directed. °· Only take over-the-counter or prescription medicines for pain, discomfort, or fever as directed by your caregiver. °· Whether the tooth ache today is caused by infection or dental disease, you should see your dentist as soon as possible for further care. °SEEK MEDICAL CARE IF: °The exam and treatment you received today has been provided on an emergency basis only. This is not a substitute for  complete medical or dental care. If your problem worsens or new problems (symptoms) appear, and you are unable to meet with your dentist, call or return to this location. °SEEK IMMEDIATE MEDICAL CARE IF:  °· You have a fever. °· You develop redness and swelling of your face, jaw, or neck. °· You are unable to open your mouth. °· You have severe pain uncontrolled by pain medicine. °MAKE SURE YOU:  °· Understand these instructions. °· Will watch your condition. °· Will get help right away if you are not doing well or get worse. °Document Released: 08/16/2005 Document Revised: 11/08/2011 Document Reviewed: 04/03/2008 °ExitCare® Patient Information ©2015 [employer]. This information is not intended to replace advice given to you by your health care provider. Make sure you discuss any questions you have with your health care provider. ° °

## 2015-06-10 ENCOUNTER — Emergency Department (HOSPITAL_COMMUNITY)
Admission: EM | Admit: 2015-06-10 | Discharge: 2015-06-10 | Disposition: A | Discharge status: Home / Self Care | Payer: Medicaid Other | Attending: Physician Assistant | Admitting: Physician Assistant

## 2015-06-10 ENCOUNTER — Encounter (HOSPITAL_COMMUNITY): Payer: Self-pay | Admitting: Emergency Medicine

## 2015-06-10 DIAGNOSIS — G4700 Insomnia, unspecified: Secondary | ICD-10-CM | POA: Diagnosis not present

## 2015-06-10 DIAGNOSIS — J45909 Unspecified asthma, uncomplicated: Secondary | ICD-10-CM | POA: Diagnosis not present

## 2015-06-10 DIAGNOSIS — Z79899 Other long term (current) drug therapy: Secondary | ICD-10-CM | POA: Insufficient documentation

## 2015-06-10 DIAGNOSIS — Z87828 Personal history of other (healed) physical injury and trauma: Secondary | ICD-10-CM | POA: Insufficient documentation

## 2015-06-10 DIAGNOSIS — Z8619 Personal history of other infectious and parasitic diseases: Secondary | ICD-10-CM | POA: Insufficient documentation

## 2015-06-10 DIAGNOSIS — Z720 Tobacco use: Secondary | ICD-10-CM | POA: Diagnosis not present

## 2015-06-10 DIAGNOSIS — G629 Polyneuropathy, unspecified: Secondary | ICD-10-CM | POA: Insufficient documentation

## 2015-06-10 DIAGNOSIS — F419 Anxiety disorder, unspecified: Secondary | ICD-10-CM | POA: Diagnosis not present

## 2015-06-10 DIAGNOSIS — K0889 Other specified disorders of teeth and supporting structures: Secondary | ICD-10-CM | POA: Insufficient documentation

## 2015-06-10 DIAGNOSIS — Z98818 Other dental procedure status: Secondary | ICD-10-CM | POA: Diagnosis not present

## 2015-06-10 DIAGNOSIS — G8929 Other chronic pain: Secondary | ICD-10-CM | POA: Diagnosis not present

## 2015-06-10 MED ORDER — OXYCODONE-ACETAMINOPHEN 5-325 MG PO TABS
1.0000 | ORAL_TABLET | Freq: Once | ORAL | Status: AC
  Administered 2015-06-10: 1 via ORAL
  Filled 2015-06-10: qty 1

## 2015-06-10 NOTE — ED Notes (Signed)
Patient brought in by EMS with complaint of upper left dental pain. States he had a tooth pulled today and the pain has gotten worse. Patient also states he can't swallow.

## 2015-06-10 NOTE — ED Notes (Signed)
Pt given some room temp water to rinse mouth - pt's behavior after rinsing mouth indicated increase in pain
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
## 2015-06-13 ENCOUNTER — Emergency Department (HOSPITAL_COMMUNITY)
Admission: EM | Admit: 2015-06-13 | Discharge: 2015-06-13 | Disposition: A | Discharge status: Home / Self Care | Payer: Medicaid Other | Attending: Emergency Medicine | Admitting: Emergency Medicine

## 2015-06-13 ENCOUNTER — Encounter (HOSPITAL_COMMUNITY): Payer: Self-pay | Admitting: Emergency Medicine

## 2015-06-13 DIAGNOSIS — G4700 Insomnia, unspecified: Secondary | ICD-10-CM | POA: Insufficient documentation

## 2015-06-13 DIAGNOSIS — G8929 Other chronic pain: Secondary | ICD-10-CM | POA: Diagnosis not present

## 2015-06-13 DIAGNOSIS — F419 Anxiety disorder, unspecified: Secondary | ICD-10-CM | POA: Diagnosis not present

## 2015-06-13 DIAGNOSIS — K047 Periapical abscess without sinus: Secondary | ICD-10-CM | POA: Insufficient documentation

## 2015-06-13 DIAGNOSIS — Z8619 Personal history of other infectious and parasitic diseases: Secondary | ICD-10-CM | POA: Insufficient documentation

## 2015-06-13 DIAGNOSIS — Z79899 Other long term (current) drug therapy: Secondary | ICD-10-CM | POA: Insufficient documentation

## 2015-06-13 DIAGNOSIS — F329 Major depressive disorder, single episode, unspecified: Secondary | ICD-10-CM | POA: Insufficient documentation

## 2015-06-13 DIAGNOSIS — G629 Polyneuropathy, unspecified: Secondary | ICD-10-CM | POA: Insufficient documentation

## 2015-06-13 DIAGNOSIS — J45909 Unspecified asthma, uncomplicated: Secondary | ICD-10-CM | POA: Insufficient documentation

## 2015-06-13 DIAGNOSIS — Z720 Tobacco use: Secondary | ICD-10-CM | POA: Insufficient documentation

## 2015-06-13 DIAGNOSIS — M273 Alveolitis of jaws: Secondary | ICD-10-CM | POA: Diagnosis not present

## 2015-06-13 DIAGNOSIS — K0889 Other specified disorders of teeth and supporting structures: Secondary | ICD-10-CM | POA: Diagnosis present

## 2015-06-13 MED ORDER — OXYCODONE HCL 5 MG PO TABS
5.0000 mg | ORAL_TABLET | Freq: Once | ORAL | Status: AC
  Administered 2015-06-13: 5 mg via ORAL
  Filled 2015-06-13: qty 1

## 2015-06-13 NOTE — ED Notes (Signed)
Pt had 2 teeth removed left lower x 10 days ago. Pt seen in ED 06/10/15 for same. Pt c/o continued pain and swelling.

## 2015-06-13 NOTE — Discharge Instructions (Signed)
Dental Dry Socket °After a tooth is pulled (extracted), blood fills up the hole (socket) where the tooth once was. This blood hardens (clots) and protects the bone and nerves underneath. Normally, gums completely grow over the top of the bones and nerves and close an open socket in about a week. After several months, the clot is replaced by bone that grows into the socket. °However, when blood does not fill up the extraction socket, or the blood clot is lost for some reason, a dry socket may form. This condition leaves the bone and nerves exposed to air, food, liquid, or anything else that enters the mouth. The gums cannot grow over the extraction socket because there is nothing to grow over, and the dry socket remains open.  °CAUSES  °· Blood not filling up the extraction socket properly. °· Anything that can dislodge a forming blood clot. Forceful spitting or sucking through a straw can pull a blood clot completely out of the socket and cause a dry socket. °· Having a difficult extraction. The forceful pushing against the wall of the socket when the tooth is extracted causes the walls of the tooth socket to become crushed. This prevents bleeding into the socket because the blood vessels have been crushed closed. °· Alcoholic drinks may dry out the blood clot and cause a dry socket. °· Smoking can disturb blood clot formation and cause a dry socket. °Factors that put you at an increased risk for a dry socket include:  °· Having lower teeth extracted. °· Being male. °· Poor oral hygiene. °· Taking birth control pills. °· Having your wisdom teeth extracted. °SYMPTOMS °· Severe, constant, dull throbbing pain. The pain generally begins 2 to 3 days after the tooth extraction. The pain may last about a week after it begins. °· Bad smelling breath and bad taste in your mouth. °· Ear pain. °HOME CARE INSTRUCTIONS °· Follow your dentist's instructions. °· Only take over-the-counter or prescription medicines for pain,  discomfort, or fever as directed by your caregiver. If pain medication does not relieve the pain, you may need to see your dentist who can clean the socket and place a medicated dressing on the extraction to promote healing. °· Take your antibiotics as told, if prescribed. Finish them even if you start to feel better. °· Wait at least a day before rinsing with warm salt water to avoid possibly dissolving the new blood clot. When salt water rinsing, spit gently to avoid pressure on the clot. °· Avoid carbonated beverages. °· Avoid alcohol. °· Avoid smoking for a few days after surgery. °Patients who have recently had oral surgery should avoid anything that may irritate the extraction socket or anything that may cause the blood clot inside the extraction socket from being dislodged. Carefully follow your instructions for after surgery care.  °SEEK IMMEDIATE DENTAL CARE IF: °· Your medicine does not relieve pain. °· You have uncontrolled bleeding, marked swelling, or severe pain. °· You develop a fever above 102° F (38.9° C), not controlled by medication. °· You have difficulty swallowing or cannot open your mouth. °· You have other severe symptoms. °  °This information is not intended to replace advice given to you by your health care provider. Make sure you discuss any questions you have with your health care provider. °  °Document Released: 02/20/2003 Document Revised: 11/08/2011 Document Reviewed: 03/31/2015 °Elsevier Interactive Patient Education ©2016 [employer] ° °
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
## 2015-07-19 ENCOUNTER — Encounter (HOSPITAL_COMMUNITY): Payer: Self-pay | Admitting: *Deleted

## 2015-07-19 ENCOUNTER — Observation Stay (HOSPITAL_COMMUNITY)
Admission: AD | Admit: 2015-07-19 | Discharge: 2015-07-21 | Disposition: A | Discharge status: Home / Self Care | Payer: Medicaid Other | Source: Intra-hospital | Attending: Psychiatry | Admitting: Psychiatry

## 2015-07-19 ENCOUNTER — Emergency Department (HOSPITAL_COMMUNITY)
Admission: EM | Admit: 2015-07-19 | Discharge: 2015-07-19 | Disposition: A | Discharge status: Other Acute Inpatient Hospital | Payer: Medicaid Other | Attending: Emergency Medicine | Admitting: Emergency Medicine

## 2015-07-19 ENCOUNTER — Encounter (HOSPITAL_COMMUNITY): Payer: Self-pay

## 2015-07-19 DIAGNOSIS — M2550 Pain in unspecified joint: Secondary | ICD-10-CM | POA: Insufficient documentation

## 2015-07-19 DIAGNOSIS — R45851 Suicidal ideations: Secondary | ICD-10-CM | POA: Diagnosis present

## 2015-07-19 DIAGNOSIS — G4700 Insomnia, unspecified: Secondary | ICD-10-CM | POA: Diagnosis not present

## 2015-07-19 DIAGNOSIS — M542 Cervicalgia: Secondary | ICD-10-CM | POA: Diagnosis not present

## 2015-07-19 DIAGNOSIS — F1490 Cocaine use, unspecified, uncomplicated: Secondary | ICD-10-CM | POA: Diagnosis not present

## 2015-07-19 DIAGNOSIS — Z8619 Personal history of other infectious and parasitic diseases: Secondary | ICD-10-CM | POA: Insufficient documentation

## 2015-07-19 DIAGNOSIS — F209 Schizophrenia, unspecified: Secondary | ICD-10-CM | POA: Diagnosis not present

## 2015-07-19 DIAGNOSIS — R51 Headache: Secondary | ICD-10-CM | POA: Insufficient documentation

## 2015-07-19 DIAGNOSIS — F329 Major depressive disorder, single episode, unspecified: Secondary | ICD-10-CM | POA: Insufficient documentation

## 2015-07-19 DIAGNOSIS — B1920 Unspecified viral hepatitis C without hepatic coma: Secondary | ICD-10-CM | POA: Insufficient documentation

## 2015-07-19 DIAGNOSIS — J45909 Unspecified asthma, uncomplicated: Secondary | ICD-10-CM | POA: Insufficient documentation

## 2015-07-19 DIAGNOSIS — G629 Polyneuropathy, unspecified: Secondary | ICD-10-CM | POA: Insufficient documentation

## 2015-07-19 DIAGNOSIS — F1320 Sedative, hypnotic or anxiolytic dependence, uncomplicated: Secondary | ICD-10-CM | POA: Insufficient documentation

## 2015-07-19 DIAGNOSIS — F1920 Other psychoactive substance dependence, uncomplicated: Secondary | ICD-10-CM | POA: Diagnosis not present

## 2015-07-19 DIAGNOSIS — Z590 Homelessness: Secondary | ICD-10-CM | POA: Diagnosis not present

## 2015-07-19 DIAGNOSIS — R4689 Other symptoms and signs involving appearance and behavior: Secondary | ICD-10-CM

## 2015-07-19 DIAGNOSIS — F1290 Cannabis use, unspecified, uncomplicated: Secondary | ICD-10-CM | POA: Insufficient documentation

## 2015-07-19 DIAGNOSIS — Z79899 Other long term (current) drug therapy: Secondary | ICD-10-CM | POA: Insufficient documentation

## 2015-07-19 DIAGNOSIS — F419 Anxiety disorder, unspecified: Secondary | ICD-10-CM | POA: Insufficient documentation

## 2015-07-19 DIAGNOSIS — F17210 Nicotine dependence, cigarettes, uncomplicated: Secondary | ICD-10-CM | POA: Diagnosis not present

## 2015-07-19 DIAGNOSIS — F1120 Opioid dependence, uncomplicated: Secondary | ICD-10-CM | POA: Diagnosis not present

## 2015-07-19 DIAGNOSIS — F410 Panic disorder [episodic paroxysmal anxiety] without agoraphobia: Secondary | ICD-10-CM | POA: Insufficient documentation

## 2015-07-19 DIAGNOSIS — M549 Dorsalgia, unspecified: Secondary | ICD-10-CM | POA: Diagnosis not present

## 2015-07-19 DIAGNOSIS — G8929 Other chronic pain: Secondary | ICD-10-CM | POA: Insufficient documentation

## 2015-07-19 DIAGNOSIS — F4310 Post-traumatic stress disorder, unspecified: Secondary | ICD-10-CM | POA: Diagnosis not present

## 2015-07-19 DIAGNOSIS — G8381 Brown-Sequard syndrome: Secondary | ICD-10-CM | POA: Diagnosis not present

## 2015-07-19 DIAGNOSIS — R4589 Other symptoms and signs involving emotional state: Secondary | ICD-10-CM

## 2015-07-19 LAB — CBC WITH DIFFERENTIAL/PLATELET
BASOS PCT: 0 %
Basophils Absolute: 0 10*3/uL (ref 0.0–0.1)
EOS ABS: 0.2 10*3/uL (ref 0.0–0.7)
Eosinophils Relative: 3 %
HCT: 44.2 % (ref 39.0–52.0)
HEMOGLOBIN: 14 g/dL (ref 13.0–17.0)
Lymphocytes Relative: 28 %
Lymphs Abs: 1.7 10*3/uL (ref 0.7–4.0)
MCH: 24.4 pg — ABNORMAL LOW (ref 26.0–34.0)
MCHC: 31.7 g/dL (ref 30.0–36.0)
MCV: 77.1 fL — ABNORMAL LOW (ref 78.0–100.0)
MONO ABS: 0.4 10*3/uL (ref 0.1–1.0)
MONOS PCT: 7 %
NEUTROS PCT: 62 %
Neutro Abs: 3.7 10*3/uL (ref 1.7–7.7)
PLATELETS: 141 10*3/uL — AB (ref 150–400)
RBC: 5.73 MIL/uL (ref 4.22–5.81)
RDW: 13.5 % (ref 11.5–15.5)
WBC: 6.1 10*3/uL (ref 4.0–10.5)

## 2015-07-19 LAB — RAPID URINE DRUG SCREEN, HOSP PERFORMED
Amphetamines: NOT DETECTED
BARBITURATES: NOT DETECTED
BENZODIAZEPINES: NOT DETECTED
COCAINE: NOT DETECTED
Opiates: NOT DETECTED
TETRAHYDROCANNABINOL: NOT DETECTED

## 2015-07-19 LAB — COMPREHENSIVE METABOLIC PANEL
ALBUMIN: 4.4 g/dL (ref 3.5–5.0)
ALT: 42 U/L (ref 17–63)
ANION GAP: 5 (ref 5–15)
AST: 26 U/L (ref 15–41)
Alkaline Phosphatase: 51 U/L (ref 38–126)
BILIRUBIN TOTAL: 0.7 mg/dL (ref 0.3–1.2)
BUN: 14 mg/dL (ref 6–20)
CALCIUM: 9.1 mg/dL (ref 8.9–10.3)
CHLORIDE: 105 mmol/L (ref 101–111)
CO2: 30 mmol/L (ref 22–32)
Creatinine, Ser: 1.03 mg/dL (ref 0.61–1.24)
GFR calc Af Amer: >60 mL/min (ref >60)
GFR calc non Af Amer: >60 mL/min (ref >60)
GLUCOSE: 92 mg/dL (ref 65–99)
POTASSIUM: 3.9 mmol/L (ref 3.5–5.1)
SODIUM: 140 mmol/L (ref 135–145)
TOTAL PROTEIN: 8.1 g/dL (ref 6.5–8.1)

## 2015-07-19 LAB — URINALYSIS, ROUTINE W REFLEX MICROSCOPIC
BILIRUBIN URINE: NEGATIVE
Glucose, UA: NEGATIVE mg/dL
HGB URINE DIPSTICK: NEGATIVE
KETONES UR: NEGATIVE mg/dL
Leukocytes, UA: NEGATIVE
Nitrite: NEGATIVE
PH: 6 (ref 5.0–8.0)
Protein, ur: NEGATIVE mg/dL
SPECIFIC GRAVITY, URINE: 1.025 (ref 1.005–1.030)

## 2015-07-19 LAB — ACETAMINOPHEN LEVEL: Acetaminophen (Tylenol), Serum: <10 ug/mL — ABNORMAL LOW (ref 10–30)

## 2015-07-19 LAB — SALICYLATE LEVEL

## 2015-07-19 LAB — ETHANOL: Alcohol, Ethyl (B): <5 mg/dL (ref <5)

## 2015-07-19 MED ORDER — HALOPERIDOL 1 MG PO TABS: 2.0000 mg | ORAL_TABLET | Freq: Every day | ORAL | Status: DC | PRN

## 2015-07-19 MED ORDER — HYDROXYZINE HCL 50 MG PO TABS
50.0000 mg | ORAL_TABLET | Freq: Every day | ORAL | Status: DC | PRN
  Filled 2015-07-19: qty 1

## 2015-07-19 MED ORDER — OXYCODONE HCL 5 MG PO TABS
5.0000 mg | ORAL_TABLET | Freq: Once | ORAL | Status: AC
  Administered 2015-07-19: 5 mg via ORAL
  Filled 2015-07-19: qty 1

## 2015-07-19 MED ORDER — ADULT MULTIVITAMIN W/MINERALS CH
1.0000 | ORAL_TABLET | Freq: Every day | ORAL | Status: DC
  Administered 2015-07-20: 1 via ORAL
  Filled 2015-07-19: qty 1

## 2015-07-19 MED ORDER — ALUM & MAG HYDROXIDE-SIMETH 200-200-20 MG/5ML PO SUSP
30.0000 mL | ORAL | Status: DC | PRN
  Filled 2015-07-19: qty 30

## 2015-07-19 MED ORDER — ALBUTEROL SULFATE HFA 108 (90 BASE) MCG/ACT IN AERS: 2.0000 | INHALATION_SPRAY | Freq: Four times a day (QID) | RESPIRATORY_TRACT | Status: DC | PRN

## 2015-07-19 MED ORDER — GABAPENTIN 300 MG PO CAPS
300.0000 mg | ORAL_CAPSULE | Freq: Three times a day (TID) | ORAL | Status: DC
  Administered 2015-07-20 – 2015-07-21 (×4): 300 mg via ORAL
  Filled 2015-07-19 (×4): qty 1

## 2015-07-19 MED ORDER — MAGNESIUM HYDROXIDE 400 MG/5ML PO SUSP: 30.0000 mL | Freq: Every day | ORAL | Status: DC | PRN

## 2015-07-19 MED ORDER — TRAZODONE HCL 100 MG PO TABS: 100.0000 mg | ORAL_TABLET | Freq: Every evening | ORAL | Status: DC | PRN

## 2015-07-19 MED ORDER — BACLOFEN 10 MG PO TABS
10.0000 mg | ORAL_TABLET | Freq: Three times a day (TID) | ORAL | Status: DC
  Administered 2015-07-20 – 2015-07-21 (×3): 10 mg via ORAL
  Filled 2015-07-19 (×11): qty 1
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
## 2015-07-19 NOTE — ED Notes (Signed)
Pt discharged to [facility] for transport to behavioral health

## 2015-07-19 NOTE — ED Notes (Signed)
SW at bedside. [employer] Resource Guide given to patient.   TTS in progress.
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
## 2015-07-19 NOTE — Progress Notes (Signed)
CM spoke with patient regarding current admission.  Patient stated his relative he was living with does not want him there.   CM provided several resources [place] along with [facility] phone number to contact during the weekday for screening the patient for shelter assistance.  CM informed patient there are no shelters in the [place].  [place] in [place].  Patient [place] shelter.  CM provided [employer] resource information.  Informed that patient may need to contact family regarding situation.  Patient to be reviewed by Behavior Health Dept. Patient shows depression and has history of SI.

## 2015-07-20 DIAGNOSIS — F1320 Sedative, hypnotic or anxiolytic dependence, uncomplicated: Secondary | ICD-10-CM

## 2015-07-20 DIAGNOSIS — F329 Major depressive disorder, single episode, unspecified: Secondary | ICD-10-CM | POA: Diagnosis not present

## 2015-07-20 DIAGNOSIS — R45851 Suicidal ideations: Secondary | ICD-10-CM

## 2015-07-20 MED ORDER — HALOPERIDOL 1 MG PO TABS
2.0000 mg | ORAL_TABLET | Freq: Two times a day (BID) | ORAL | Status: DC
Start: 2015-07-20 — End: 2015-07-21
  Administered 2015-07-21: 2 mg via ORAL
  Filled 2015-07-20 (×2): qty 2

## 2015-07-20 MED ORDER — HYDROXYZINE HCL 50 MG PO TABS: 50.0000 mg | ORAL_TABLET | Freq: Four times a day (QID) | ORAL | Status: DC | PRN

## 2015-07-20 MED ORDER — TRAMADOL HCL 50 MG PO TABS: 50.0000 mg | ORAL_TABLET | Freq: Four times a day (QID) | ORAL | Status: DC | PRN

## 2015-07-20 NOTE — Progress Notes (Signed)
Nursing Note:  Patient has been asking RN numerous times about "getting my medicine" and "Did she write for medicine, she said she was [place] for [place] medicine".  New orders just acknowledged by RN then letting patient know that he has now been ordered Ultram for pain. Patient stating " I can't take that" which RN could not understand his response and asked him what he said and he responds "I can't take that". RN asking him why not and pt responds "cause it's not my medicine". RN asking patient if he is allergic to it and he states "no, it's just not my medicine and I'm not going to take it". Patient then asking RN to "call the doctor and ask her if she'll write an order" for "his" medicine. RN repsonding that he has already spoken to her and told her about his medicine right? He doesn't respond but RN informing him that MH providers do not usually write to continue a narcotic that was written by a Physician of another field, that the initial Physician or at least one of that field would be the one to write an order like that.  Patient now not talking but is sitting in a chair watching television with blanket pulled up to his face and shaking his leg rhythymically, then acquiring items needed to take a shower.

## 2015-07-20 NOTE — BHH Counselor (Signed)
Pt came in with the assumption that [facility] and was disappointed we did not admit him to Adult unit.  Pt stated that he wanted "old" room back and his expectation is to stay on adult unit for 15 days.  Pt has periods where he will laugh lightly out loud. Pt is unable to explain why he is laughing.  Pt. Refused scheduled med and prn meds.  Sts he is not normally on them and wont take them. Pt appears to not be fully vested in receiving the treatment that he needs. Pt recently refused to have his AM vitals taken. Pt is concerned about what type of medications that he will be getting and refuses to engage with any of the staff a respectful manner. Pt is very moody and agitated.Pt will be seen by AM extender to further determine disposition.

## 2015-07-20 NOTE — Progress Notes (Signed)
D: At begining of shift pt sts he had AV/H.  States he saw some people in Halloween costumes and sts he heard someone calling his name.  Did not report any AV/H to first shift nurse and sts no longer seeing or hearing anything.  Strong BO.  Acts someone aggressive but backs down easily when redirected or explanations given.  Constantly Guine[place]ary and demanding of certain snacks and drinks we don't have.  Wants coke and candy bars or neutra-grain bars.  Pt actively listens to staff conversations and if he thinks staff are discussing him he sits up in bed and makes comments. A: Pt given ginger ale and peanut butter and crackers.  advised pt that he needs to drink water as he has not had BM since 11/13.  Pt does have active bowel sounds and is passing foul smelling gas.  Continue to monitor for safety.  Giving encouragement and explanations for questions asked. R: Pt resting quietly in bed eating crackers and watching TV.  Q 15 min checks for safety.

## 2015-07-20 NOTE — Progress Notes (Signed)
[facility] INPATIENT:  Family/Significant Other Suicide Prevention Education  Suicide Prevention Education:  Patient Refusal for Family/Significant Other Suicide Prevention Education: The patient Ernst BreachJason M Votta has refused to provide written consent for family/significant other to be provided Family/Significant Other Suicide Prevention Education during admission and/or prior to discharge.  Physician notified.  Glenice LaineIbekwe, Darneshia Demary B 07/20/2015, 6:59 AM

## 2015-07-20 NOTE — Progress Notes (Signed)
Nursing Shift Assessment:  Patient is mostly talking out loud about "needing to be on my medicine" and asking Nurse "they don't have any narcotics around here do they"? Patient thus far has been compliant in taking his scheduled meds and has spoken with the Provider but continues asking the same questions about "my medicine". Patient states he is having SI but denies any HI or AVH. Q15 minute checks for safety, emotional support and therapeutic communication including consistency in regards to fielding his questions regarding "needing my medication". Patient remains safe on Unit and verbally contracts for safety.
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
## 2015-07-21 ENCOUNTER — Emergency Department (HOSPITAL_COMMUNITY)
Admission: EM | Admit: 2015-07-21 | Discharge: 2015-07-21 | Disposition: A | Discharge status: Home / Self Care | Payer: Medicaid Other | Attending: Emergency Medicine | Admitting: Emergency Medicine

## 2015-07-21 ENCOUNTER — Encounter (HOSPITAL_COMMUNITY): Payer: Self-pay | Admitting: Emergency Medicine

## 2015-07-21 DIAGNOSIS — G8929 Other chronic pain: Secondary | ICD-10-CM | POA: Insufficient documentation

## 2015-07-21 DIAGNOSIS — Z8619 Personal history of other infectious and parasitic diseases: Secondary | ICD-10-CM | POA: Insufficient documentation

## 2015-07-21 DIAGNOSIS — Y9389 Activity, other specified: Secondary | ICD-10-CM | POA: Diagnosis not present

## 2015-07-21 DIAGNOSIS — Y9289 Other specified places as the place of occurrence of the external cause: Secondary | ICD-10-CM | POA: Insufficient documentation

## 2015-07-21 DIAGNOSIS — S99911A Unspecified injury of right ankle, initial encounter: Secondary | ICD-10-CM | POA: Diagnosis present

## 2015-07-21 DIAGNOSIS — F419 Anxiety disorder, unspecified: Secondary | ICD-10-CM | POA: Diagnosis not present

## 2015-07-21 DIAGNOSIS — G629 Polyneuropathy, unspecified: Secondary | ICD-10-CM | POA: Insufficient documentation

## 2015-07-21 DIAGNOSIS — S93401A Sprain of unspecified ligament of right ankle, initial encounter: Secondary | ICD-10-CM | POA: Insufficient documentation

## 2015-07-21 DIAGNOSIS — F329 Major depressive disorder, single episode, unspecified: Secondary | ICD-10-CM | POA: Insufficient documentation

## 2015-07-21 DIAGNOSIS — X58XXXA Exposure to other specified factors, initial encounter: Secondary | ICD-10-CM | POA: Insufficient documentation

## 2015-07-21 DIAGNOSIS — Y998 Other external cause status: Secondary | ICD-10-CM | POA: Diagnosis not present

## 2015-07-21 DIAGNOSIS — F17210 Nicotine dependence, cigarettes, uncomplicated: Secondary | ICD-10-CM | POA: Diagnosis not present

## 2015-07-21 DIAGNOSIS — J45909 Unspecified asthma, uncomplicated: Secondary | ICD-10-CM | POA: Diagnosis not present

## 2015-07-21 DIAGNOSIS — Z79899 Other long term (current) drug therapy: Secondary | ICD-10-CM | POA: Insufficient documentation

## 2015-07-21 MED ORDER — CHLORHEXIDINE GLUCONATE 0.12 % MT SOLN: OROMUCOSAL | Status: DC

## 2015-07-21 MED ORDER — ADULT MULTIVITAMIN W/MINERALS CH: 1.0000 | ORAL_TABLET | Freq: Every day | ORAL | Status: DC

## 2015-07-21 MED ORDER — GABAPENTIN 300 MG PO CAPS: 300.0000 mg | ORAL_CAPSULE | Freq: Three times a day (TID) | ORAL | Status: DC

## 2015-07-21 NOTE — ED Notes (Signed)
Pt jsut left [facility] today, pt here c/o right ankle pain.  Rates pain 8/10.  Pt laughing and smiling during triage.  When asked what he was laughing at, he says "nothing".
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
## 2015-07-21 NOTE — Discharge Instructions (Signed)
Ankle Sprain  An ankle sprain is an injury to the strong, fibrous tissues (ligaments) that hold the bones of your ankle joint together.   CAUSES  An ankle sprain is usually caused by a fall or by twisting your ankle. Ankle sprains most commonly occur when you step on the outer edge of your foot, and your ankle turns inward. People who participate in sports are more prone to these types of injuries.   SYMPTOMS    Pain in your ankle. The pain may be present at rest or only when you are trying to stand or walk.   Swelling.   Bruising. Bruising may develop immediately or within 1 to 2 days after your injury.   Difficulty standing or walking, particularly when turning corners or changing directions.  DIAGNOSIS   Your caregiver will ask you details about your injury and perform a physical exam of your ankle to determine if you have an ankle sprain. During the physical exam, your caregiver will press on and apply pressure to specific areas of your foot and ankle. Your caregiver will try to move your ankle in certain ways. An X-ray exam may be done to be sure a bone was not broken or a ligament did not separate from one of the bones in your ankle (avulsion fracture).   TREATMENT   Certain types of braces can help stabilize your ankle. Your caregiver can make a recommendation for this. Your caregiver may recommend the use of medicine for pain. If your sprain is severe, your caregiver may refer you to a surgeon who helps to restore function to parts of your skeletal system (orthopedist) or a physical therapist.  HOME CARE INSTRUCTIONS    Apply ice to your injury for 1-2 days or as directed by your caregiver. Applying ice helps to reduce inflammation and pain.    Put ice in a plastic bag.    Place a towel between your skin and the bag.    Leave the ice on for 15-20 minutes at a time, every 2 hours while you are awake.   Only take over-the-counter or prescription medicines for pain, discomfort, or fever as directed by  your caregiver.   Elevate your injured ankle above the level of your heart as much as possible for 2-3 days.   If your caregiver recommends crutches, use them as instructed. Gradually put weight on the affected ankle. Continue to use crutches or a cane until you can walk without feeling pain in your ankle.   If you have a plaster splint, wear the splint as directed by your caregiver. Do not rest it on anything harder than a pillow for the first 24 hours. Do not put weight on it. Do not get it wet. You may take it off to take a shower or bath.   You may have been given an elastic bandage to wear around your ankle to provide support. If the elastic bandage is too tight (you have numbness or tingling in your foot or your foot becomes cold and blue), adjust the bandage to make it comfortable.   If you have an air splint, you may blow more air into it or let air out to make it more comfortable. You may take your splint off at night and before taking a shower or bath. Wiggle your toes in the splint several times per day to decrease swelling.  SEEK MEDICAL CARE IF:    You have rapidly increasing bruising or swelling.   Your toes feel   extremely cold or you lose feeling in your foot.   Your pain is not relieved with medicine.  SEEK IMMEDIATE MEDICAL CARE IF:   Your toes are numb or blue.   You have severe pain that is increasing.  MAKE SURE YOU:    Understand these instructions.   Will watch your condition.   Will get help right away if you are not doing well or get worse.     This information is not intended to replace advice given to you by your health care provider. Make sure you discuss any questions you have with your health care provider.     Document Released: 08/16/2005 Document Revised: 09/06/2014 Document Reviewed: 08/28/2011  [employer] Interactive Patient Education 2016 [employer]
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
## 2015-07-21 NOTE — Progress Notes (Signed)
Patient ID: Cole [name], male   DOB: 12/25/1980, 34 y.o.   MRN: [id number] Patient refused meds this AM.  Later requested meds stating he was hearing voices telling him to hurt himself telling him to "spaz out, jump around and holler".  Has slept most of am. Eating well. Frequently sitting in bed backwards and covering his head.

## 2015-07-21 NOTE — Progress Notes (Signed)
Patient ID: Cole BreachJason M [name], male   DOB: 01/27/1981, 34 y.o.   MRN: [id number] Patient discharged per MD orders. Patient given education regarding follow-up appointments and medications. Patient denies any questions or concerns about these instructions. Patient was escorted to locker and given belongings before discharge to hospital lobby. Patient currently denies SI/HI and auditory and visual hallucinations on discharge.

## 2015-08-06 ENCOUNTER — Emergency Department (HOSPITAL_COMMUNITY)
Admission: EM | Admit: 2015-08-06 | Discharge: 2015-08-06 | Discharge status: Against Medical Advice | Payer: Medicaid Other | Attending: Emergency Medicine | Admitting: Emergency Medicine

## 2015-08-06 ENCOUNTER — Encounter (HOSPITAL_COMMUNITY): Payer: Self-pay | Admitting: *Deleted

## 2015-08-06 DIAGNOSIS — J45909 Unspecified asthma, uncomplicated: Secondary | ICD-10-CM | POA: Diagnosis not present

## 2015-08-06 DIAGNOSIS — S0990XA Unspecified injury of head, initial encounter: Secondary | ICD-10-CM | POA: Insufficient documentation

## 2015-08-06 DIAGNOSIS — Y9289 Other specified places as the place of occurrence of the external cause: Secondary | ICD-10-CM | POA: Insufficient documentation

## 2015-08-06 DIAGNOSIS — Y9389 Activity, other specified: Secondary | ICD-10-CM | POA: Diagnosis not present

## 2015-08-06 DIAGNOSIS — Y998 Other external cause status: Secondary | ICD-10-CM | POA: Insufficient documentation

## 2015-08-06 DIAGNOSIS — F17210 Nicotine dependence, cigarettes, uncomplicated: Secondary | ICD-10-CM | POA: Diagnosis not present

## 2015-08-06 DIAGNOSIS — W11XXXA Fall on and from ladder, initial encounter: Secondary | ICD-10-CM | POA: Diagnosis not present

## 2015-08-06 DIAGNOSIS — G8929 Other chronic pain: Secondary | ICD-10-CM | POA: Insufficient documentation

## 2015-08-06 NOTE — ED Notes (Signed)
Pt states he fell off a ladder at work 3 work. Pt states he fell about 10-15 ft. Pt c/o back pain, weakness, and headache. Pt states he did hit the back of his head.

## 2015-08-06 NOTE — ED Notes (Signed)
Patient walked out without being seen.

## 2015-08-12 ENCOUNTER — Encounter: Payer: Self-pay | Admitting: Emergency Medicine

## 2015-08-12 ENCOUNTER — Emergency Department
Admission: EM | Admit: 2015-08-12 | Discharge: 2015-08-13 | Disposition: A | Discharge status: Other Acute Inpatient Hospital | Payer: Medicaid Other | Attending: Emergency Medicine | Admitting: Emergency Medicine

## 2015-08-12 DIAGNOSIS — F329 Major depressive disorder, single episode, unspecified: Secondary | ICD-10-CM | POA: Diagnosis not present

## 2015-08-12 DIAGNOSIS — R45851 Suicidal ideations: Secondary | ICD-10-CM | POA: Diagnosis present

## 2015-08-12 DIAGNOSIS — F333 Major depressive disorder, recurrent, severe with psychotic symptoms: Secondary | ICD-10-CM | POA: Diagnosis not present

## 2015-08-12 DIAGNOSIS — F209 Schizophrenia, unspecified: Secondary | ICD-10-CM | POA: Insufficient documentation

## 2015-08-12 DIAGNOSIS — Z79899 Other long term (current) drug therapy: Secondary | ICD-10-CM | POA: Insufficient documentation

## 2015-08-12 DIAGNOSIS — F1310 Sedative, hypnotic or anxiolytic abuse, uncomplicated: Secondary | ICD-10-CM | POA: Insufficient documentation

## 2015-08-12 DIAGNOSIS — F200 Paranoid schizophrenia: Secondary | ICD-10-CM | POA: Diagnosis present

## 2015-08-12 DIAGNOSIS — G8929 Other chronic pain: Secondary | ICD-10-CM | POA: Diagnosis present

## 2015-08-12 DIAGNOSIS — F17210 Nicotine dependence, cigarettes, uncomplicated: Secondary | ICD-10-CM | POA: Insufficient documentation

## 2015-08-12 LAB — COMPREHENSIVE METABOLIC PANEL
ALBUMIN: 4.2 g/dL (ref 3.5–5.0)
ALT: 41 U/L (ref 17–63)
AST: 21 U/L (ref 15–41)
Alkaline Phosphatase: 49 U/L (ref 38–126)
Anion gap: 5 (ref 5–15)
BILIRUBIN TOTAL: 0.6 mg/dL (ref 0.3–1.2)
BUN: 10 mg/dL (ref 6–20)
CHLORIDE: 106 mmol/L (ref 101–111)
CO2: 26 mmol/L (ref 22–32)
CREATININE: 0.87 mg/dL (ref 0.61–1.24)
Calcium: 8.9 mg/dL (ref 8.9–10.3)
GFR calc Af Amer: >60 mL/min (ref >60)
GLUCOSE: 86 mg/dL (ref 65–99)
POTASSIUM: 4.1 mmol/L (ref 3.5–5.1)
Sodium: 137 mmol/L (ref 135–145)
TOTAL PROTEIN: 7.8 g/dL (ref 6.5–8.1)

## 2015-08-12 LAB — CBC
HEMATOCRIT: 44.5 % (ref 40.0–52.0)
Hemoglobin: 14.5 g/dL (ref 13.0–18.0)
MCH: 25.3 pg — AB (ref 26.0–34.0)
MCHC: 32.5 g/dL (ref 32.0–36.0)
MCV: 77.8 fL — AB (ref 80.0–100.0)
Platelets: 134 10*3/uL — ABNORMAL LOW (ref 150–440)
RBC: 5.73 MIL/uL (ref 4.40–5.90)
RDW: 14.3 % (ref 11.5–14.5)
WBC: 5 10*3/uL (ref 3.8–10.6)

## 2015-08-12 LAB — ETHANOL: Alcohol, Ethyl (B): <5 mg/dL (ref <5)

## 2015-08-12 LAB — ACETAMINOPHEN LEVEL: Acetaminophen (Tylenol), Serum: <10 ug/mL — ABNORMAL LOW (ref 10–30)

## 2015-08-12 LAB — SALICYLATE LEVEL: Salicylate Lvl: <4 mg/dL (ref 2.8–30.0)

## 2015-08-12 MED ORDER — GABAPENTIN 300 MG PO CAPS
300.0000 mg | ORAL_CAPSULE | Freq: Three times a day (TID) | ORAL | Status: DC
  Administered 2015-08-12 – 2015-08-13 (×3): 300 mg via ORAL
  Filled 2015-08-12 (×3): qty 1

## 2015-08-12 MED ORDER — ALPRAZOLAM 0.5 MG PO TABS
1.0000 mg | ORAL_TABLET | Freq: Three times a day (TID) | ORAL | Status: DC | PRN
  Administered 2015-08-12 (×2): 1 mg via ORAL
  Filled 2015-08-12 (×2): qty 2

## 2015-08-12 MED ORDER — HALOPERIDOL 2 MG PO TABS
2.0000 mg | ORAL_TABLET | Freq: Two times a day (BID) | ORAL | Status: DC
  Administered 2015-08-12 – 2015-08-13 (×2): 2 mg via ORAL
  Filled 2015-08-12 (×2): qty 1

## 2015-08-12 MED ORDER — OXYCODONE HCL 5 MG PO TABS
5.0000 mg | ORAL_TABLET | Freq: Three times a day (TID) | ORAL | Status: DC | PRN
  Administered 2015-08-12 – 2015-08-13 (×2): 5 mg via ORAL
  Filled 2015-08-12 (×2): qty 1

## 2015-08-12 NOTE — ED Notes (Signed)
Patient provided with hs snack; graham crackers, peanut butter, and soda per his request.

## 2015-08-12 NOTE — ED Notes (Signed)
Patient reporting significant pain rated 8/10. Patient has flagged down MD in the hall several times to ask for pain medications. MD with [facility] orders for Oxycodone 5mg  PO per PRN orders. Order to be carried by this RN.

## 2015-08-12 NOTE — ED Notes (Signed)

## 2015-08-12 NOTE — ED Notes (Signed)
Pt. Noted in room lying in bed watching the tv; . No complaints or concerns voiced. No distress or abnormal behavior noted. Will continue to monitor with security cameras. Q 15 minute rounds continue. 
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
## 2015-08-12 NOTE — ED Notes (Signed)
Patient reporting that he is still hungry s/p hs snack. MD with [facility] to give patient a sandwich tray. Tray provided by this RN.

## 2015-08-12 NOTE — ED Notes (Signed)
"  I started feeling suicidal last night around 2300 or 0000."  Patient states "I was going to cut myself in my wrists with a knife." Patient states he is still feeling suicidal and his plan is to cut his wrists with a knife.  Denies HI.

## 2015-08-12 NOTE — ED Notes (Signed)
Supper provided along with an extra drink  Pt observed with no unusual behavior  Appropriate to stimulation  No verbalized needs or concerns at this time  NAD assessed  Continue to monitor 

## 2015-08-12 NOTE — ED Notes (Signed)
BEHAVIORAL HEALTH ROUNDING Patient sleeping: NO Patient alert and oriented: YES Behavior appropriate: YES Describe behavior: No inappropriate or unacceptable behaviors noted at this time.  Nutrition and fluids offered: YES Toileting and hygiene offered: YES Sitter present: Behavioral tech rounding every 15 minutes on patient to ensure safety.  Law enforcement present: YES Law enforcement agency: [employer] ([employer])  ENVIRONMENTAL ASSESSMENT Potentially harmful objects out of patient reach: YES Personal belongings secured: YES Patient dressed in hospital provided attire only: YES Plastic bags out of patient reach: YES Patient care equipment (cords, cables, call bells, lines, and drains) shortened, removed, or accounted for: YES Equipment and supplies removed from bottom of stretcher: YES Potentially toxic materials out of patient reach: YES Sharps container removed or out of patient reach: YES  
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
## 2015-08-12 NOTE — ED Notes (Signed)
Pt. Noted in room resting quietly;. No complaints or concerns voiced. No distress or abnormal behavior noted. Will continue to monitor with security cameras. Q 15 minute rounds continue. 

## 2015-08-12 NOTE — ED Notes (Signed)
Report received from Isaias CowmanBrian G., RN. Pt. Alert and oriented in no distress verbalizes having SI; denies having any HI, AVH and pain.  Pt. Instructed to come to me with problems or concerns.Will continue to monitor for safety via security cameras and Q 15 minute checks.

## 2015-08-12 NOTE — ED Notes (Signed)
BEHAVIORAL HEALTH ROUNDING Patient sleeping: Yes.   Patient alert and oriented: eyes closed  Appears asleep Behavior appropriate: Yes.  ; If no, describe:  Nutrition and fluids offered: Yes  Toileting and hygiene offered: sleeping Sitter present: q 15 minute observations and security camera monitoring Law enforcement present: yes  ODS 

## 2015-08-12 NOTE — ED Notes (Signed)
TTS speaking with patient at this time.  

## 2015-08-12 NOTE — ED Notes (Signed)
Pt. To ED-[facility] from ED ambulatory without difficulty, to room #4  . Report from RN. Pt. Is alert and oriented, warm and dry in no distress. Pt. Verbalizes having SI; denies having any HI, and AVH. Pt. Calm and cooperative. Pt. Made aware of security cameras and Q15 minute rounds. Pt. Encouraged to let Nursing staff know of any concerns or needs.

## 2015-08-13 ENCOUNTER — Inpatient Hospital Stay
Admission: EM | Admit: 2015-08-13 | Discharge: 2015-08-14 | DRG: 885 | Disposition: A | Discharge status: Home / Self Care | Payer: Medicaid Other | Attending: Psychiatry | Admitting: Psychiatry

## 2015-08-13 DIAGNOSIS — F17200 Nicotine dependence, unspecified, uncomplicated: Secondary | ICD-10-CM

## 2015-08-13 DIAGNOSIS — F331 Major depressive disorder, recurrent, moderate: Secondary | ICD-10-CM | POA: Diagnosis not present

## 2015-08-13 DIAGNOSIS — G8929 Other chronic pain: Secondary | ICD-10-CM | POA: Diagnosis present

## 2015-08-13 DIAGNOSIS — Z888 Allergy status to other drugs, medicaments and biological substances status: Secondary | ICD-10-CM | POA: Diagnosis not present

## 2015-08-13 DIAGNOSIS — F1310 Sedative, hypnotic or anxiolytic abuse, uncomplicated: Secondary | ICD-10-CM

## 2015-08-13 DIAGNOSIS — G4700 Insomnia, unspecified: Secondary | ICD-10-CM | POA: Diagnosis present

## 2015-08-13 DIAGNOSIS — Z79899 Other long term (current) drug therapy: Secondary | ICD-10-CM | POA: Diagnosis not present

## 2015-08-13 DIAGNOSIS — R768 Other specified abnormal immunological findings in serum: Secondary | ICD-10-CM | POA: Diagnosis present

## 2015-08-13 DIAGNOSIS — F1120 Opioid dependence, uncomplicated: Secondary | ICD-10-CM | POA: Diagnosis present

## 2015-08-13 DIAGNOSIS — J45909 Unspecified asthma, uncomplicated: Secondary | ICD-10-CM | POA: Diagnosis present

## 2015-08-13 DIAGNOSIS — Z818 Family history of other mental and behavioral disorders: Secondary | ICD-10-CM

## 2015-08-13 DIAGNOSIS — F17210 Nicotine dependence, cigarettes, uncomplicated: Secondary | ICD-10-CM | POA: Diagnosis present

## 2015-08-13 DIAGNOSIS — F209 Schizophrenia, unspecified: Secondary | ICD-10-CM | POA: Diagnosis present

## 2015-08-13 DIAGNOSIS — F333 Major depressive disorder, recurrent, severe with psychotic symptoms: Secondary | ICD-10-CM

## 2015-08-13 DIAGNOSIS — F4310 Post-traumatic stress disorder, unspecified: Secondary | ICD-10-CM | POA: Diagnosis present

## 2015-08-13 DIAGNOSIS — F419 Anxiety disorder, unspecified: Secondary | ICD-10-CM | POA: Diagnosis present

## 2015-08-13 DIAGNOSIS — F13239 Sedative, hypnotic or anxiolytic dependence with withdrawal, unspecified: Secondary | ICD-10-CM | POA: Diagnosis present

## 2015-08-13 DIAGNOSIS — S24144A Brown-Sequard syndrome at T11-T12 level of thoracic spinal cord, initial encounter: Secondary | ICD-10-CM | POA: Diagnosis present

## 2015-08-13 DIAGNOSIS — F1590 Other stimulant use, unspecified, uncomplicated: Secondary | ICD-10-CM | POA: Diagnosis present

## 2015-08-13 DIAGNOSIS — Z833 Family history of diabetes mellitus: Secondary | ICD-10-CM

## 2015-08-13 HISTORY — DX: Major depressive disorder, recurrent, moderate: F33.1

## 2015-08-13 HISTORY — DX: Opioid dependence, uncomplicated: F11.20

## 2015-08-13 HISTORY — DX: Sedative, hypnotic or anxiolytic abuse, uncomplicated: F13.10

## 2015-08-13 LAB — URINE DRUG SCREEN, QUALITATIVE (ARMC ONLY)
AMPHETAMINES, UR SCREEN: NOT DETECTED
Barbiturates, Ur Screen: NOT DETECTED
Benzodiazepine, Ur Scrn: POSITIVE — AB
CANNABINOID 50 NG, UR ~~LOC~~: NOT DETECTED
COCAINE METABOLITE, UR ~~LOC~~: NOT DETECTED
MDMA (ECSTASY) UR SCREEN: NOT DETECTED
Methadone Scn, Ur: NOT DETECTED
Opiate, Ur Screen: NOT DETECTED
Phencyclidine (PCP) Ur S: NOT DETECTED
TRICYCLIC, UR SCREEN: NOT DETECTED

## 2015-08-13 LAB — GLUCOSE, CAPILLARY: GLUCOSE-CAPILLARY: 102 mg/dL — AB (ref 65–99)

## 2015-08-13 MED ORDER — ALPRAZOLAM 0.5 MG PO TABS
1.0000 mg | ORAL_TABLET | Freq: Two times a day (BID) | ORAL | Status: DC | PRN
  Administered 2015-08-13: 1 mg via ORAL
  Filled 2015-08-13: qty 2

## 2015-08-13 MED ORDER — GABAPENTIN 400 MG PO CAPS
400.0000 mg | ORAL_CAPSULE | Freq: Three times a day (TID) | ORAL | Status: DC
  Administered 2015-08-13 – 2015-08-14 (×3): 400 mg via ORAL
  Filled 2015-08-13 (×3): qty 1

## 2015-08-13 MED ORDER — CYCLOBENZAPRINE HCL 10 MG PO TABS
10.0000 mg | ORAL_TABLET | Freq: Three times a day (TID) | ORAL | Status: DC | PRN
  Administered 2015-08-13: 10 mg via ORAL
  Filled 2015-08-13: qty 1

## 2015-08-13 MED ORDER — GABAPENTIN 300 MG PO CAPS: 300.0000 mg | ORAL_CAPSULE | Freq: Three times a day (TID) | ORAL | Status: DC

## 2015-08-13 MED ORDER — OXYCODONE-ACETAMINOPHEN 5-325 MG PO TABS
ORAL_TABLET | ORAL | Status: AC
  Filled 2015-08-13: qty 1

## 2015-08-13 MED ORDER — ALUM & MAG HYDROXIDE-SIMETH 200-200-20 MG/5ML PO SUSP: 30.0000 mL | ORAL | Status: DC | PRN

## 2015-08-13 MED ORDER — DULOXETINE HCL 20 MG PO CPEP
40.0000 mg | ORAL_CAPSULE | Freq: Every day | ORAL | Status: DC
  Administered 2015-08-13 – 2015-08-14 (×2): 40 mg via ORAL
  Filled 2015-08-13 (×2): qty 2
  Filled 2015-08-13: qty 1

## 2015-08-13 MED ORDER — CHLORDIAZEPOXIDE HCL 25 MG PO CAPS
50.0000 mg | ORAL_CAPSULE | Freq: Three times a day (TID) | ORAL | Status: DC
  Administered 2015-08-13 – 2015-08-14 (×3): 50 mg via ORAL
  Filled 2015-08-13 (×3): qty 2

## 2015-08-13 MED ORDER — TRAZODONE HCL 100 MG PO TABS: 100.0000 mg | ORAL_TABLET | Freq: Every evening | ORAL | Status: DC | PRN

## 2015-08-13 MED ORDER — LIDOCAINE 5 % EX PTCH
1.0000 | MEDICATED_PATCH | CUTANEOUS | Status: DC
  Filled 2015-08-13 (×2): qty 1

## 2015-08-13 MED ORDER — MAGNESIUM HYDROXIDE 400 MG/5ML PO SUSP: 30.0000 mL | Freq: Every day | ORAL | Status: DC | PRN

## 2015-08-13 MED ORDER — NICOTINE 21 MG/24HR TD PT24
21.0000 mg | MEDICATED_PATCH | Freq: Every day | TRANSDERMAL | Status: DC
  Administered 2015-08-14: 21 mg via TRANSDERMAL
  Filled 2015-08-13: qty 1

## 2015-08-13 NOTE — ED Notes (Signed)
Dr clapacs seeing pt
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
## 2015-08-13 NOTE — [facility] Group Notes (Signed)
LCSW Group Therapy   08/13/2015 1:15 PM   Type of Therapy: Group Therapy   Participation Level: Did Not Attend. Patient invited to participate but declined.    Dorothe PeaJonathan F. Anylah Scheib, MSW, LCSWA, LCAS

## 2015-08-13 NOTE — ED Notes (Signed)
Called report to unit no one available for report
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
## 2015-08-13 NOTE — ED Notes (Signed)
Pt. Noted in room. No complaints or concerns voiced. No distress or abnormal behavior noted. Will continue to monitor with security cameras. Q 15 minute rounds continue. 

## 2015-08-13 NOTE — ED Notes (Signed)
Pt adm to unit he is coop, has all belongings

## 2015-08-13 NOTE — ED Notes (Signed)
Pt up to bathroom no c/o voiced

## 2015-08-13 NOTE — ED Notes (Signed)
Pt c/o of anxiety , given meds as ordered also sent uds to the lab

## 2015-08-13 NOTE — BH Assessment (Signed)
[facility] Toni Amendlapacs.  Attending Physician will be Dr. Ardyth HarpsHernandez.   Patient has [facility].   Intake Paper Work has been signed and placed on patient chart.  ER staff is aware of the admission Cole [name](Glenda, ER Sect.; Dr. Royston CowperWillimams, ER MD; Windell Mouldinguth, Patient's Nurse & Lorene Dyhristie, Patient Access).

## 2015-08-13 NOTE — Plan of Care (Signed)
Problem: Ineffective individual coping Goal: STG: Patient will remain free from self harm Outcome: Progressing Patient has remained free from harm since admission.      
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
## 2015-08-13 NOTE — ED Notes (Signed)
Pt. Noted in room. No complaints or concerns voiced. No distress or abnormal behavior noted. Will continue to monitor with security cameras. Q 15 minute rounds continue.pt sound asleep

## 2015-08-13 NOTE — ED Notes (Signed)
Pt. Noted in room resting quietly;. No complaints or concerns voiced. No distress or abnormal behavior noted. Will continue to monitor with security cameras. Q 15 minute rounds continue. 

## 2015-08-13 NOTE — ED Notes (Signed)
Pt eating lunch

## 2015-08-13 NOTE — [facility] Suicide Risk Assessment (Signed)
[facility]   Nursing information obtained from:    Demographic factors:    Current Mental Status:    Loss Factors:    Historical Factors:    Risk Reduction Factors:    Total Time spent with patient: 1 hour Principal Problem: Major depressive disorder, recurrent episode, moderate (HCC) Diagnosis:   Patient Active Problem List   Diagnosis Date Noted  . Major depressive disorder, recurrent episode, moderate (HCC) [F33.1] 08/13/2015  . Opioid use disorder, severe, dependence (HCC) per history [F11.20] 08/13/2015  . Benzodiazepine abuse [F13.10] 08/13/2015  . Tobacco use disorder [F17.200] 08/13/2015  . Hepatitis C reactive [R89.4] 01/22/2015  . PTSD (post-traumatic stress disorder) [F43.10] 01/20/2015  . 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 Chronic pain [G89.29] 10/17/2011  . Failed vision screen 05/12/2011     Continued Clinical Symptoms:    The "Alcohol Use Disorders Identification Test", Guidelines for Use in Primary Care, Second Edition.  World Science writerHealth Organization [facility](WHO). Score between 0-7:  no or low risk or alcohol related problems. Score between 8-15:  moderate risk of alcohol related problems. Score between 16-19:  high risk of alcohol related problems. Score 20 or above:  warrants further diagnostic evaluation for alcohol dependence and treatment.   CLINICAL FACTORS:   Depression:   Impulsivity Alcohol/Substance Abuse/Dependencies Chronic Pain Previous Psychiatric Diagnoses and Treatments Medical Diagnoses and Treatments/Surgeries   Psychiatric Specialty Exam: Physical Exam  ROS    COGNITIVE FEATURES THAT CONTRIBUTE TO RISK:  Closed-mindedness    SUICIDE RISK:   Mild:  Suicidal ideation of limited frequency, intensity, duration, and specificity.  There are no identifiable plans, no associated intent, mild dysphoria and  related symptoms, good self-control (both objective and subjective assessment), few other risk factors, and identifiable protective factors, including available and accessible social support.  [facility]  Medical Decision Making:  Established Problem, Worsening (2)  I certify that inpatient services furnished can reasonably be expected to improve the patient's condition.   Jimmy FootmanHernandez-Gonzalez,  Bora Broner 08/13/2015, 4:38 PM

## 2015-08-13 NOTE — ED Notes (Signed)
Pt. Noted in room. No complaints or concerns voiced. No distress or abnormal behavior noted. Will continue to monitor with security cameras. Q 15 minute rounds continue.,also being seen by counselor TTS worker at this time

## 2015-08-13 NOTE — BH Assessment (Addendum)
Writer spoke with patient to get an update on his current mental and emotional status. He endorses SI with a plan to cut his wrist.

## 2015-08-13 NOTE — ED Notes (Signed)
Pt appears asleep at this time with no distress 

## 2015-08-13 NOTE — ED Notes (Signed)
Pt awake and pleasant alert and oriented just asking about when psych md would be here , watching tv in acute distress ,does contract for safety

## 2015-08-13 NOTE — ED Notes (Signed)

## 2015-08-14 MED ORDER — LIDOCAINE 5 % EX PTCH: 1.0000 | MEDICATED_PATCH | CUTANEOUS | Status: DC

## 2015-08-14 MED ORDER — TRAZODONE HCL 100 MG PO TABS: 100.0000 mg | ORAL_TABLET | Freq: Every evening | ORAL | Status: DC | PRN

## 2015-08-14 MED ORDER — GABAPENTIN 400 MG PO CAPS: 400.0000 mg | ORAL_CAPSULE | Freq: Three times a day (TID) | ORAL | Status: DC

## 2015-08-14 MED ORDER — INFLUENZA VAC SPLIT QUAD 0.5 ML IM SUSY: 0.5000 mL | PREFILLED_SYRINGE | INTRAMUSCULAR | Status: DC

## 2015-08-14 MED ORDER — DULOXETINE HCL 40 MG PO CPEP: 40.0000 mg | ORAL_CAPSULE | Freq: Every day | ORAL | Status: DC

## 2015-08-14 MED ORDER — CYCLOBENZAPRINE HCL 10 MG PO TABS: 10.0000 mg | ORAL_TABLET | Freq: Three times a day (TID) | ORAL | Status: DC | PRN

## 2015-08-14 NOTE — Plan of Care (Signed)
Problem: Alteration in mood Goal: LTG-Patient reports reduction in suicidal thoughts (Patient reports reduction in suicidal thoughts and is able to verbalize a safety plan for whenever patient is feeling suicidal)  Outcome: Progressing Patient denied SI at this time

## 2015-08-14 NOTE — [facility] Group Notes (Signed)
[facility] Group Notes:  (Nursing/MHT/Case Management/Adjunct)  Date:  08/14/2015  Time:  12:58 AM  Type of Therapy:  Group Therapy  Participation Level:  Minimal  Participation Quality:  Appropriate  Affect:  Appropriate  Cognitive:  Appropriate  Insight:  Limited  Engagement in Group:  Limited  Modes of Intervention:  n/a  Summary of Progress/Problems:  Cole [name] 08/14/2015, 12:58 AM

## 2015-08-14 NOTE — Progress Notes (Signed)
Patient denies SI/HI, denies A/V hallucinations. Patient verbalizes understanding of discharge instructions, follow up care and prescriptions. Patient given all belongings from locker. Patient escorted out by staff. 

## 2015-08-14 NOTE — [facility] Suicide Risk Assessment (Signed)
[facility]   Demographic Factors:  Male, Low socioeconomic status and Unemployed  Total Time spent with patient: 30 minutes    Psychiatric Specialty Exam: Physical Exam  ROS                                                            Has this patient used any form of tobacco in the last 30 days? (Cigarettes, Smokeless Tobacco, Cigars, and/or Pipes) Yes, A prescription for an FDA-approved tobacco cessation medication was offered at discharge and the patient refused  Mental Status Per Nursing Assessment::   On Admission:     Current Mental Status by Physician: denies SI, HI or hallucinations.  Denies hopelessness or helplessness.  No psychosis, mania or hypomania.  Loss Factors: Decline in physical health  Historical Factors: Impulsivity  Risk Reduction Factors:   Living with another person, especially a relative  Continued Clinical Symptoms:  Depression:   Impulsivity Alcohol/Substance Abuse/Dependencies Chronic Pain Previous Psychiatric Diagnoses and Treatments Medical Diagnoses and Treatments/Surgeries  Cognitive Features That Contribute To Risk:  Closed-mindedness    Suicide Risk:  Minimal: No identifiable suicidal ideation.  Patients presenting with no risk factors but with morbid ruminations; may be classified as minimal risk based on the severity of the depressive symptoms  Principal Problem: Major depressive disorder, recurrent episode, moderate (HCC) Discharge Diagnoses:  Patient Active Problem List   Diagnosis Date Noted  . Major depressive disorder, recurrent episode, moderate (HCC) [F33.1] 08/13/2015  . Opioid use disorder, severe, dependence (HCC) per history [F11.20] 08/13/2015  . Benzodiazepine abuse [F13.10] 08/13/2015  . Tobacco use disorder [F17.200] 08/13/2015  . Hepatitis C reactive [R89.4] 01/22/2015  . PTSD (post-traumatic stress disorder) [F43.10] 01/20/2015  . Stimulant use disorder (HCC)  [F15.90] 01/20/2015  . 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 Chronic pain [G89.29] 10/17/2011  . Failed vision screen 05/12/2011      Is patient on multiple antipsychotic therapies at discharge:  No   Has Patient had three or more failed trials of antipsychotic monotherapy by history:  No  Recommended Plan for Multiple Antipsychotic Therapies: NA    Jimmy FootmanHernandez-Gonzalez,  Latiesha Harada 08/14/2015, 7:22 AM

## 2015-08-14 NOTE — Progress Notes (Signed)
Patient state that he no longer felt like doing harm to hisself and was looking forward to being release. Did not want to talk. Clifton JamesMelvin Erasto Sleight, Chaplain [phone]
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
## 2015-08-14 NOTE — [facility] Suicide Risk Assessment (Signed)
[facility] INPATIENT:  Family/Significant Other Suicide Prevention Education  Suicide Prevention Education:  Patient Refusal for Family/Significant Other Suicide Prevention Education: The patient [name] has refused to provide written consent for family/significant other to be provided Family/Significant Other Suicide Prevention Education during admission and/or prior to discharge.  Physician notified.  Lulu RidingIngle, Cashawn T, MSW, LCSWA 08/14/2015, 1:08 PM

## 2015-08-14 NOTE — Progress Notes (Signed)
  [facility]  Will you be returning to the same living situation after discharge:  Yes,  home with his mom and grandmother At discharge, do you have transportation home?: No. Patient refused [employer] ticket to his cousin's house [place] and will discharge from hospital to [facility] Do you have the ability to pay for your medications: Yes,  patient has Medicaid  Release of information consent forms completed and in the chart;  Patient's signature needed at discharge.  Patient to Follow up at: Follow-up Information    Follow up with [facility] on 08/21/2015.   Why:  For follow-up care appt Thursday 08/21/15 at 10:40am   Contact information:   [address] [place] [phone] fax [phone]      Next level of care provider [facility] Link:no  Patient denies SI/HI: Yes,  patient denies SI/HI    Safety Planning and Suicide Prevention discussed: Yes,  SPE discussed with patient but patient refused family consent to provide further SPE     Has patient been referred to the Quitline?: Patient refused referral  Patient has been referred for addiction treatment: Pt. refused referral  Lulu RidingIngle, Jyquan T, MSW, LCSWA 08/14/2015, 1:18 PM

## 2015-08-14 NOTE — Tx Team (Signed)
Interdisciplinary Treatment Plan Update (Adult)  Date:  08/14/2015 Time Reviewed:  1:14 PM  Progress in Treatment: Attending groups: No. Participating in groups:  No. Taking medication as prescribed:  Yes. Tolerating medication:  Yes. Family/Significant othe contact made:  No, will contact:  if patient provides consent Patient understands diagnosis:  Yes. Discussing patient identified problems/goals with staff:  Yes. Medical problems stabilized or resolved:  Yes. Denies suicidal/homicidal ideation: Yes. Issues/concerns per patient self-inventory:  No. Other:  New problem(s) identified: No, Describe:  none reported  Discharge Plan or Barriers:Patient will discharge home to his mother and grandmother's home and follow up with Dr. Kasandra Knudsen at [facility] in [place]. Patient is referred to [facility] Coordinator due to multiple hospitalizations.   Reason for Continuation of Hospitalization: Depression Suicidal ideation  Comments:  Estimated length of stay:0 days expected discharge today  New goal(s):  Review of initial/current patient goals per problem list:   1.  Goal(s):participate in care planning  Met:  Yes  Target date: by discharge  2.  Goal (s):eliminate SI  Met:  Yes  Target date:by discharge  As evidenced by:by patient report  3.  Goal(s):reduce depression  Met:  Yes  Target date:  As evidenced by:by patient report   Attendees: Physician:  Merlyn Albert, MD 12/15/20161:14 PM  Nursing:   Terressa Koyanagi, RN 12/15/20161:14 PM  Other:  Carmell Austria, LCSWA 12/15/20161:14 PM  Other:  Everitt Amber, [place] 12/15/20161:14 PM  Other:   12/15/20161:14 PM  Other:  12/15/20161:14 PM  Other:  12/15/20161:14 PM  Other:  12/15/20161:14 PM  Other:  12/15/20161:14 PM  Other:  12/15/20161:14 PM  Other:  12/15/20161:14 PM  Other:   12/15/20161:14 PM   Scribe for Treatment Team:   Keene Breath, MSW, LCSWA [phone] 08/14/2015, 1:14 PM

## 2015-08-14 NOTE — BHH Counselor (Signed)
Adult Comprehensive Assessment  Patient ID: Ernst BreachJason M Langhorst, male   DOB: 04/02/1981, 34 y.o.   MRN: [id number]  Information Source: Information source: Patient  Current Stressors:  Family Relationships: arguing with mom, had SI  Living/Environment/Situation:  Living Arrangements: Parent (lives with mother and grandmother) How long has patient lived in current situation?: 10-20 yrs What is atmosphere in current home: Comfortable  Family History:  Marital status: Single Are you sexually active?: No What is your sexual orientation?: heterosexual Does patient have children?: Yes How many children?: 1 How is patient's relationship with their children?: 312 yo girl-good relationship  Childhood History:  By whom was/is the patient raised?: Mother Additional childhood history information: dad in [place]. talk to him on the phone-not much of a relationship Description of patient's relationship with caregiver when they were a child: great Patient's description of current relationship with people who raised him/her: still great How were you disciplined when you got in trouble as a child/adolescent?: no trouble to get discipline Does patient have siblings?: Yes Number of Siblings: 1 Description of patient's current relationship with siblings: 32 yo brother-good relationship Did patient suffer any verbal/emotional/physical/sexual abuse as a child?: No Did patient suffer from severe childhood neglect?: No Has patient ever been sexually abused/assaulted/raped as an adolescent or adult?: No Was the patient ever a victim of a crime or a disaster?: No Witnessed domestic violence?: No Has patient been effected by domestic violence as an adult?: No  Education:  Highest grade of school patient has completed: 11 Currently a student?: No Name of school: [employer] Learning disability?: No  Employment/Work Situation:   Employment situation: Unemployed (trying to get disability) Patient's job has  been impacted by current illness: Yes Describe how patient's job has been impacted: anxiety, depression What is the longest time patient has a held a job?: 2 years Where was the patient employed at that time?: [employer] Has patient ever been in the Eli Lilly and Companymilitary?: No Has patient ever served in combat?: No Did You Receive Any Psychiatric Treatment/Services While in Equities traderthe Military?: No Are There Guns or Other Weapons in Your Home?: No Are These ComptrollerWeapons Safely Secured?:  (n/a)  Financial Resources:   Financial resources: Support from parents / caregiver Does patient have a Lawyerrepresentative payee or guardian?: No  Alcohol/Substance Abuse:   What has been your use of drugs/alcohol within the last 12 months?: denies Alcohol/Substance Abuse Treatment Hx: Denies past history  Social Support System:   Forensic psychologistatient's Community Support System: Production assistant, radioGood Describe Community Support System: mom, grandmother, [facility], aunt and uncle Type of faith/religion: denies  Leisure/Recreation:   Leisure and Hobbies: denies  Strengths/Needs:   What things does the patient do well?: play basketball In what areas does patient struggle / problems for patient: computers  Discharge Plan:   Does patient have access to transportation?: No Plan for no access to transportation at discharge: [employer] bus pass [address] [place], [place] Will patient be returning to same living situation after discharge?: Yes Currently receiving community mental health services: Yes (From Whom) ([facility] [name] [phone]) Does patient have financial barriers related to discharge medications?: No  Summary/Recommendations:  Patient is a 34 yo AA male admitted for depression. Patient lives with his grandmother and mother and got into argument with his mother and is able to return. Patient has no transportation but was offered a [employer] ticket to his cousins [place]. Patient refused and will discharge from the hospital possibly today. Patient has been  hospitalized several times in  [place] and will be referred to Care Coordinator and patient has Medicaid. Patient has appointment with [facility] [phone] in [facility] for follow up. Patient refused family consent with this Clinical research associate. Patient is encouraged to participate in medication management, group therapy, and therapeutic milieu.     Lulu Riding., MSW, Theresia Majors [phone]  08/14/2015

## 2015-08-14 NOTE — Progress Notes (Signed)
D: Patient appears flat. Speech is slow. CIWA 1. Patient is anxious but does not exhibit any other withdrawal symptoms. Denies SI/HI/AVH at this time. Patient states he has pain but it's a chronic pain and does not want to take anything.  A: Medication was given with education. Encouragement was provided. CIWA was performed.  R: Patient was compliant with medication. He has been calm and cooperative. Safety maintained with 15 min checks.

## 2016-03-25 ENCOUNTER — Emergency Department (HOSPITAL_COMMUNITY)
Admission: EM | Admit: 2016-03-25 | Discharge: 2016-03-25 | Disposition: A | Discharge status: Home / Self Care | Payer: Medicaid Other | Attending: Emergency Medicine | Admitting: Emergency Medicine

## 2016-03-25 ENCOUNTER — Encounter (HOSPITAL_COMMUNITY): Payer: Self-pay | Admitting: Emergency Medicine

## 2016-03-25 DIAGNOSIS — M5441 Lumbago with sciatica, right side: Secondary | ICD-10-CM | POA: Insufficient documentation

## 2016-03-25 DIAGNOSIS — Z79899 Other long term (current) drug therapy: Secondary | ICD-10-CM | POA: Insufficient documentation

## 2016-03-25 DIAGNOSIS — Z046 Encounter for general psychiatric examination, requested by authority: Secondary | ICD-10-CM | POA: Diagnosis present

## 2016-03-25 DIAGNOSIS — J45909 Unspecified asthma, uncomplicated: Secondary | ICD-10-CM | POA: Insufficient documentation

## 2016-03-25 DIAGNOSIS — F17210 Nicotine dependence, cigarettes, uncomplicated: Secondary | ICD-10-CM | POA: Insufficient documentation

## 2016-03-25 LAB — CBC
HCT: 40.2 % (ref 39.0–52.0)
HEMOGLOBIN: 13.6 g/dL (ref 13.0–17.0)
MCH: 26.1 pg (ref 26.0–34.0)
MCHC: 33.8 g/dL (ref 30.0–36.0)
MCV: 77.2 fL — ABNORMAL LOW (ref 78.0–100.0)
Platelets: 155 10*3/uL (ref 150–400)
RBC: 5.21 MIL/uL (ref 4.22–5.81)
RDW: 13.7 % (ref 11.5–15.5)
WBC: 6.2 10*3/uL (ref 4.0–10.5)

## 2016-03-25 LAB — BASIC METABOLIC PANEL
ANION GAP: 6 (ref 5–15)
BUN: 7 mg/dL (ref 6–20)
CHLORIDE: 106 mmol/L (ref 101–111)
CO2: 28 mmol/L (ref 22–32)
CREATININE: 1.01 mg/dL (ref 0.61–1.24)
Calcium: 8.8 mg/dL — ABNORMAL LOW (ref 8.9–10.3)
GFR calc non Af Amer: >60 mL/min (ref >60)
Glucose, Bld: 122 mg/dL — ABNORMAL HIGH (ref 65–99)
POTASSIUM: 3.4 mmol/L — AB (ref 3.5–5.1)
SODIUM: 140 mmol/L (ref 135–145)

## 2016-03-25 LAB — RAPID URINE DRUG SCREEN, HOSP PERFORMED
AMPHETAMINES: NOT DETECTED
BENZODIAZEPINES: NOT DETECTED
Barbiturates: NOT DETECTED
COCAINE: POSITIVE — AB
OPIATES: NOT DETECTED
TETRAHYDROCANNABINOL: NOT DETECTED

## 2016-03-25 MED ORDER — CYCLOBENZAPRINE HCL 10 MG PO TABS: 10.0000 mg | ORAL_TABLET | Freq: Three times a day (TID) | ORAL | 0 refills | Status: DC | PRN

## 2016-03-25 MED ORDER — CYCLOBENZAPRINE HCL 10 MG PO TABS
10.0000 mg | ORAL_TABLET | Freq: Once | ORAL | Status: AC
  Administered 2016-03-25: 10 mg via ORAL
  Filled 2016-03-25: qty 1

## 2016-03-25 MED ORDER — POTASSIUM CHLORIDE CRYS ER 20 MEQ PO TBCR
40.0000 meq | EXTENDED_RELEASE_TABLET | Freq: Once | ORAL | Status: AC
  Administered 2016-03-25: 40 meq via ORAL
  Filled 2016-03-25: qty 2

## 2016-03-25 NOTE — ED Triage Notes (Signed)
Pt reports paranoid schizophrenia with hallucinations, voices telling him to kill himself. Pt is suicidal at this time, denies depression.  Pt states he has no reason for having SI. Pt has no active plan at this time.  Pt is calm and cooperative at this time.
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
## 2016-03-25 NOTE — ED Notes (Signed)
Pt wanded by security. 

## 2016-03-25 NOTE — ED Notes (Signed)
Patient informed of pending discharge. States he will call his ride from the waiting room. Awaiting d/c paperwork at this time.
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
## 2016-06-25 ENCOUNTER — Emergency Department (HOSPITAL_COMMUNITY)
Admission: EM | Admit: 2016-06-25 | Discharge: 2016-06-25 | Disposition: A | Discharge status: Home / Self Care | Payer: Medicaid Other | Attending: Emergency Medicine | Admitting: Emergency Medicine

## 2016-06-25 ENCOUNTER — Encounter (HOSPITAL_COMMUNITY): Payer: Self-pay | Admitting: Emergency Medicine

## 2016-06-25 DIAGNOSIS — M545 Low back pain: Secondary | ICD-10-CM | POA: Diagnosis present

## 2016-06-25 DIAGNOSIS — G8929 Other chronic pain: Secondary | ICD-10-CM | POA: Diagnosis not present

## 2016-06-25 DIAGNOSIS — Z79899 Other long term (current) drug therapy: Secondary | ICD-10-CM | POA: Diagnosis not present

## 2016-06-25 DIAGNOSIS — J45909 Unspecified asthma, uncomplicated: Secondary | ICD-10-CM | POA: Diagnosis not present

## 2016-06-25 DIAGNOSIS — M5442 Lumbago with sciatica, left side: Secondary | ICD-10-CM | POA: Diagnosis not present

## 2016-06-25 DIAGNOSIS — K0889 Other specified disorders of teeth and supporting structures: Secondary | ICD-10-CM | POA: Insufficient documentation

## 2016-06-25 DIAGNOSIS — F17210 Nicotine dependence, cigarettes, uncomplicated: Secondary | ICD-10-CM | POA: Diagnosis not present

## 2016-06-25 DIAGNOSIS — M5441 Lumbago with sciatica, right side: Secondary | ICD-10-CM | POA: Diagnosis not present

## 2016-06-25 MED ORDER — OXYCODONE-ACETAMINOPHEN 5-325 MG PO TABS
1.0000 | ORAL_TABLET | Freq: Once | ORAL | Status: AC
  Administered 2016-06-25: 1 via ORAL
  Filled 2016-06-25: qty 1

## 2016-06-25 MED ORDER — PREDNISONE 10 MG PO TABS: ORAL_TABLET | ORAL | 0 refills | Status: DC

## 2016-06-25 MED ORDER — AMOXICILLIN 500 MG PO CAPS: 500.0000 mg | ORAL_CAPSULE | Freq: Three times a day (TID) | ORAL | 0 refills | Status: DC

## 2016-06-25 NOTE — ED Triage Notes (Signed)
Pt lower back pain from pulling tobacco and right upper dental pain x 1 week.

## 2016-06-25 NOTE — Discharge Instructions (Signed)
Follow-up with pain management next week.  Call one of the dentist listed to arrange a follow-up
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
## 2016-07-02 ENCOUNTER — Emergency Department (HOSPITAL_COMMUNITY)
Admission: EM | Admit: 2016-07-02 | Discharge: 2016-07-02 | Disposition: A | Discharge status: Home / Self Care | Payer: Medicaid Other | Attending: Emergency Medicine | Admitting: Emergency Medicine

## 2016-07-02 ENCOUNTER — Encounter (HOSPITAL_COMMUNITY): Payer: Self-pay | Admitting: Emergency Medicine

## 2016-07-02 DIAGNOSIS — F17210 Nicotine dependence, cigarettes, uncomplicated: Secondary | ICD-10-CM | POA: Insufficient documentation

## 2016-07-02 DIAGNOSIS — J45909 Unspecified asthma, uncomplicated: Secondary | ICD-10-CM | POA: Diagnosis not present

## 2016-07-02 DIAGNOSIS — K029 Dental caries, unspecified: Secondary | ICD-10-CM | POA: Insufficient documentation

## 2016-07-02 DIAGNOSIS — Z7982 Long term (current) use of aspirin: Secondary | ICD-10-CM | POA: Diagnosis not present

## 2016-07-02 DIAGNOSIS — Z79899 Other long term (current) drug therapy: Secondary | ICD-10-CM | POA: Diagnosis not present

## 2016-07-02 DIAGNOSIS — K0889 Other specified disorders of teeth and supporting structures: Secondary | ICD-10-CM | POA: Diagnosis present

## 2016-07-02 MED ORDER — IBUPROFEN 800 MG PO TABS
800.0000 mg | ORAL_TABLET | Freq: Once | ORAL | Status: AC
  Administered 2016-07-02: 800 mg via ORAL
  Filled 2016-07-02: qty 1

## 2016-07-02 MED ORDER — DIPHENHYDRAMINE HCL 25 MG PO CAPS
25.0000 mg | ORAL_CAPSULE | Freq: Once | ORAL | Status: AC
  Administered 2016-07-02: 25 mg via ORAL
  Filled 2016-07-02: qty 1

## 2016-07-02 MED ORDER — IBUPROFEN 800 MG PO TABS: 800.0000 mg | ORAL_TABLET | Freq: Three times a day (TID) | ORAL | 0 refills | Status: DC

## 2016-07-02 MED ORDER — DIPHENHYDRAMINE HCL 25 MG PO TABS: 25.0000 mg | ORAL_TABLET | Freq: Four times a day (QID) | ORAL | 0 refills | Status: DC

## 2016-07-02 NOTE — ED Triage Notes (Signed)
Returns for dental pain on right side. Has dental appointment next Wednesday. Taking antibiotic without relief.

## 2016-07-02 NOTE — ED Notes (Signed)
Pt did not return to room.  Checked with registration and pt is not in waiting area.  Trisha Mangle. Triplett informed.
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
## 2016-07-02 NOTE — ED Notes (Signed)
Pt walked out of room towards main entrance.  Says, I going to my mother.

## 2016-07-02 NOTE — Discharge Instructions (Signed)
Follow-up with your dentist next week as discussed.

## 2016-07-10 ENCOUNTER — Encounter (HOSPITAL_COMMUNITY): Payer: Self-pay | Admitting: *Deleted

## 2016-07-10 ENCOUNTER — Emergency Department (HOSPITAL_COMMUNITY)
Admission: EM | Admit: 2016-07-10 | Discharge: 2016-07-10 | Disposition: A | Discharge status: Home / Self Care | Payer: Medicaid Other | Attending: Emergency Medicine | Admitting: Emergency Medicine

## 2016-07-10 ENCOUNTER — Emergency Department (HOSPITAL_COMMUNITY): Payer: Medicaid Other

## 2016-07-10 DIAGNOSIS — Z79899 Other long term (current) drug therapy: Secondary | ICD-10-CM | POA: Diagnosis not present

## 2016-07-10 DIAGNOSIS — Z791 Long term (current) use of non-steroidal anti-inflammatories (NSAID): Secondary | ICD-10-CM | POA: Diagnosis not present

## 2016-07-10 DIAGNOSIS — T07XXXA Unspecified multiple injuries, initial encounter: Secondary | ICD-10-CM

## 2016-07-10 DIAGNOSIS — Y999 Unspecified external cause status: Secondary | ICD-10-CM | POA: Diagnosis not present

## 2016-07-10 DIAGNOSIS — Y929 Unspecified place or not applicable: Secondary | ICD-10-CM | POA: Insufficient documentation

## 2016-07-10 DIAGNOSIS — S0990XA Unspecified injury of head, initial encounter: Secondary | ICD-10-CM | POA: Diagnosis present

## 2016-07-10 DIAGNOSIS — F17210 Nicotine dependence, cigarettes, uncomplicated: Secondary | ICD-10-CM | POA: Insufficient documentation

## 2016-07-10 DIAGNOSIS — S01511A Laceration without foreign body of lip, initial encounter: Secondary | ICD-10-CM | POA: Diagnosis not present

## 2016-07-10 DIAGNOSIS — Y939 Activity, unspecified: Secondary | ICD-10-CM | POA: Insufficient documentation

## 2016-07-10 DIAGNOSIS — J45909 Unspecified asthma, uncomplicated: Secondary | ICD-10-CM | POA: Insufficient documentation

## 2016-07-10 DIAGNOSIS — S0083XA Contusion of other part of head, initial encounter: Secondary | ICD-10-CM

## 2016-07-10 MED ORDER — POVIDONE-IODINE 10 % EX SOLN
CUTANEOUS | Status: AC
  Filled 2016-07-10: qty 118

## 2016-07-10 MED ORDER — CYCLOBENZAPRINE HCL 10 MG PO TABS
10.0000 mg | ORAL_TABLET | Freq: Once | ORAL | Status: AC
  Administered 2016-07-10: 10 mg via ORAL
  Filled 2016-07-10: qty 1

## 2016-07-10 MED ORDER — PENICILLIN V POTASSIUM 500 MG PO TABS: 500.0000 mg | ORAL_TABLET | Freq: Four times a day (QID) | ORAL | 0 refills | Status: AC

## 2016-07-10 MED ORDER — CYCLOBENZAPRINE HCL 5 MG PO TABS: 5.0000 mg | ORAL_TABLET | Freq: Three times a day (TID) | ORAL | 0 refills | Status: DC | PRN

## 2016-07-10 MED ORDER — PENICILLIN V POTASSIUM 250 MG PO TABS
500.0000 mg | ORAL_TABLET | Freq: Once | ORAL | Status: AC
  Administered 2016-07-10: 500 mg via ORAL
  Filled 2016-07-10: qty 2

## 2016-07-10 MED ORDER — LIDOCAINE-EPINEPHRINE (PF) 2 %-1:200000 IJ SOLN
10.0000 mL | Freq: Once | INTRAMUSCULAR | Status: AC
  Administered 2016-07-10: 10 mL
  Filled 2016-07-10: qty 20

## 2016-07-10 NOTE — ED Notes (Signed)
Pt alert & oriented x4, stable gait. Patient given discharge instructions, paperwork & prescription(s). Patient verbalized understanding. Pt left department in wheelchair escorted by staff. Pt left w/ no further questions. 

## 2016-07-10 NOTE — Discharge Instructions (Signed)
Keep the lacerations and abrasions clean and dry. Use ice packs for comfort. You can take the flexeril for pain as needed. The sutures in your lip need to be removed in about 1 week. Take the Pen VK to prevent and infection in your lip laceration. Return to the ED for any problems listed on the head injury sheet. If you feel you need something stronger for pain, you will need to [facility].

## 2016-07-10 NOTE — ED Notes (Signed)
Pt resting w/ eyes closed. NAD noted
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
## 2016-08-18 ENCOUNTER — Emergency Department (HOSPITAL_COMMUNITY)
Admission: EM | Admit: 2016-08-18 | Discharge: 2016-08-18 | Disposition: A | Discharge status: Home / Self Care | Payer: Medicaid Other | Attending: Emergency Medicine | Admitting: Emergency Medicine

## 2016-08-18 ENCOUNTER — Encounter (HOSPITAL_COMMUNITY): Payer: Self-pay | Admitting: Emergency Medicine

## 2016-08-18 DIAGNOSIS — L02412 Cutaneous abscess of left axilla: Secondary | ICD-10-CM

## 2016-08-18 DIAGNOSIS — F17210 Nicotine dependence, cigarettes, uncomplicated: Secondary | ICD-10-CM | POA: Diagnosis not present

## 2016-08-18 DIAGNOSIS — J45909 Unspecified asthma, uncomplicated: Secondary | ICD-10-CM | POA: Diagnosis not present

## 2016-08-18 DIAGNOSIS — Z79899 Other long term (current) drug therapy: Secondary | ICD-10-CM | POA: Diagnosis not present

## 2016-08-18 MED ORDER — OXYCODONE HCL 5 MG PO TABS
5.0000 mg | ORAL_TABLET | Freq: Once | ORAL | Status: AC
  Administered 2016-08-18: 5 mg via ORAL
  Filled 2016-08-18: qty 1

## 2016-08-18 MED ORDER — OXYCODONE HCL 5 MG PO TABS: 5.0000 mg | ORAL_TABLET | ORAL | 0 refills | Status: DC | PRN

## 2016-08-18 MED ORDER — SULFAMETHOXAZOLE-TRIMETHOPRIM 800-160 MG PO TABS: 1.0000 | ORAL_TABLET | Freq: Two times a day (BID) | ORAL | 0 refills | Status: AC

## 2016-08-18 MED ORDER — LIDOCAINE HCL (PF) 1 % IJ SOLN
5.0000 mL | Freq: Once | INTRAMUSCULAR | Status: AC
  Administered 2016-08-18: 5 mL
  Filled 2016-08-18: qty 5

## 2016-08-18 NOTE — Discharge Instructions (Signed)
Take your entire course of antibiotics as prescribed.  You may take the hydrocodone prescribed for pain relief.  This will make you drowsy - do not drive within 4 hours of taking this medication.  Apply a warm soak or hot shower treatment to your abscess site twice daily until improved.

## 2016-08-18 NOTE — ED Triage Notes (Signed)
Pt reports abscess in his L axillary region.
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
## 2016-11-12 ENCOUNTER — Ambulatory Visit (HOSPITAL_COMMUNITY)
Admission: RE | Admit: 2016-11-12 | Discharge: 2016-11-12 | Disposition: A | Discharge status: Home / Self Care | Payer: Medicaid Other | Source: Ambulatory Visit | Attending: Family Medicine | Admitting: Family Medicine

## 2016-11-12 ENCOUNTER — Other Ambulatory Visit (HOSPITAL_COMMUNITY): Payer: Self-pay | Admitting: Family Medicine

## 2016-11-12 DIAGNOSIS — J939 Pneumothorax, unspecified: Secondary | ICD-10-CM

## 2016-11-12 DIAGNOSIS — Z8709 Personal history of other diseases of the respiratory system: Secondary | ICD-10-CM | POA: Diagnosis not present

## 2017-01-09 ENCOUNTER — Encounter (HOSPITAL_COMMUNITY): Payer: Self-pay | Admitting: *Deleted

## 2017-01-09 ENCOUNTER — Emergency Department (HOSPITAL_COMMUNITY)
Admission: EM | Admit: 2017-01-09 | Discharge: 2017-01-09 | Disposition: A | Discharge status: Home / Self Care | Payer: Medicaid Other | Attending: Emergency Medicine | Admitting: Emergency Medicine

## 2017-01-09 DIAGNOSIS — R42 Dizziness and giddiness: Secondary | ICD-10-CM | POA: Diagnosis present

## 2017-01-09 DIAGNOSIS — J45909 Unspecified asthma, uncomplicated: Secondary | ICD-10-CM | POA: Insufficient documentation

## 2017-01-09 DIAGNOSIS — Z79899 Other long term (current) drug therapy: Secondary | ICD-10-CM | POA: Insufficient documentation

## 2017-01-09 DIAGNOSIS — J0110 Acute frontal sinusitis, unspecified: Secondary | ICD-10-CM | POA: Diagnosis not present

## 2017-01-09 DIAGNOSIS — F17210 Nicotine dependence, cigarettes, uncomplicated: Secondary | ICD-10-CM | POA: Insufficient documentation

## 2017-01-09 LAB — COMPREHENSIVE METABOLIC PANEL
ALBUMIN: 4.1 g/dL (ref 3.5–5.0)
ALK PHOS: 48 U/L (ref 38–126)
ALT: 15 U/L — ABNORMAL LOW (ref 17–63)
ANION GAP: 5 (ref 5–15)
AST: 17 U/L (ref 15–41)
BUN: 9 mg/dL (ref 6–20)
CALCIUM: 9.2 mg/dL (ref 8.9–10.3)
CO2: 31 mmol/L (ref 22–32)
Chloride: 103 mmol/L (ref 101–111)
Creatinine, Ser: 0.98 mg/dL (ref 0.61–1.24)
GFR calc Af Amer: >60 mL/min (ref >60)
GFR calc non Af Amer: >60 mL/min (ref >60)
GLUCOSE: 63 mg/dL — AB (ref 65–99)
POTASSIUM: 3.3 mmol/L — AB (ref 3.5–5.1)
SODIUM: 139 mmol/L (ref 135–145)
Total Bilirubin: 1.3 mg/dL — ABNORMAL HIGH (ref 0.3–1.2)
Total Protein: 7.5 g/dL (ref 6.5–8.1)

## 2017-01-09 LAB — CBG MONITORING, ED: Glucose-Capillary: 122 mg/dL — ABNORMAL HIGH (ref 65–99)

## 2017-01-09 LAB — CBC WITH DIFFERENTIAL/PLATELET
Basophils Absolute: 0 10*3/uL (ref 0.0–0.1)
Basophils Relative: 0 %
EOS ABS: 0.3 10*3/uL (ref 0.0–0.7)
Eosinophils Relative: 4 %
HCT: 44.9 % (ref 39.0–52.0)
HEMOGLOBIN: 15.1 g/dL (ref 13.0–17.0)
LYMPHS ABS: 2 10*3/uL (ref 0.7–4.0)
Lymphocytes Relative: 32 %
MCH: 26.5 pg (ref 26.0–34.0)
MCHC: 33.6 g/dL (ref 30.0–36.0)
MCV: 78.9 fL (ref 78.0–100.0)
Monocytes Absolute: 0.5 10*3/uL (ref 0.1–1.0)
Monocytes Relative: 9 %
Neutro Abs: 3.4 10*3/uL (ref 1.7–7.7)
Neutrophils Relative %: 55 %
Platelets: 165 10*3/uL (ref 150–400)
RBC: 5.69 MIL/uL (ref 4.22–5.81)
RDW: 14.1 % (ref 11.5–15.5)
WBC: 6.3 10*3/uL (ref 4.0–10.5)

## 2017-01-09 MED ORDER — AMOXICILLIN 500 MG PO CAPS: 500.0000 mg | ORAL_CAPSULE | Freq: Three times a day (TID) | ORAL | 0 refills | Status: AC

## 2017-01-09 MED ORDER — CETIRIZINE-PSEUDOEPHEDRINE ER 5-120 MG PO TB12: 1.0000 | ORAL_TABLET | Freq: Two times a day (BID) | ORAL | 0 refills | Status: DC
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
## 2017-01-09 NOTE — Discharge Instructions (Signed)
Take the medicines prescribed.  Steam as discussed can also help open your sinus's.  See your doctor for a recheck if not improving over the next several days.

## 2017-01-09 NOTE — ED Notes (Signed)
Pt sleeping upon rounding. 

## 2017-01-09 NOTE — ED Triage Notes (Signed)
Pt comes in with dizziness starting while walking to get breakfast this morning. Pt is ambulatory upon triage. He has sinus congestion as well.

## 2017-01-26 ENCOUNTER — Other Ambulatory Visit (HOSPITAL_COMMUNITY)
Admission: RE | Admit: 2017-01-26 | Discharge: 2017-01-26 | Disposition: A | Discharge status: Home / Self Care | Payer: Medicaid Other | Source: Ambulatory Visit | Attending: Internal Medicine | Admitting: Internal Medicine

## 2017-01-26 DIAGNOSIS — D510 Vitamin B12 deficiency anemia due to intrinsic factor deficiency: Secondary | ICD-10-CM | POA: Diagnosis present

## 2017-01-26 DIAGNOSIS — D500 Iron deficiency anemia secondary to blood loss (chronic): Secondary | ICD-10-CM | POA: Insufficient documentation

## 2017-01-26 LAB — COMPREHENSIVE METABOLIC PANEL
ALBUMIN: 4.2 g/dL (ref 3.5–5.0)
ALK PHOS: 53 U/L (ref 38–126)
ALT: 18 U/L (ref 17–63)
ANION GAP: 6 (ref 5–15)
AST: 15 U/L (ref 15–41)
BUN: 8 mg/dL (ref 6–20)
CALCIUM: 9.1 mg/dL (ref 8.9–10.3)
CHLORIDE: 103 mmol/L (ref 101–111)
CO2: 29 mmol/L (ref 22–32)
Creatinine, Ser: 0.99 mg/dL (ref 0.61–1.24)
GFR calc non Af Amer: >60 mL/min (ref >60)
GLUCOSE: 100 mg/dL — AB (ref 65–99)
POTASSIUM: 3.2 mmol/L — AB (ref 3.5–5.1)
SODIUM: 138 mmol/L (ref 135–145)
Total Bilirubin: 0.9 mg/dL (ref 0.3–1.2)
Total Protein: 7.7 g/dL (ref 6.5–8.1)

## 2017-01-26 LAB — CBC WITH DIFFERENTIAL/PLATELET
BASOS PCT: 0 %
Basophils Absolute: 0 10*3/uL (ref 0.0–0.1)
Eosinophils Absolute: 0.1 10*3/uL (ref 0.0–0.7)
Eosinophils Relative: 3 %
HEMATOCRIT: 45.9 % (ref 39.0–52.0)
HEMOGLOBIN: 15.5 g/dL (ref 13.0–17.0)
LYMPHS ABS: 1.4 10*3/uL (ref 0.7–4.0)
LYMPHS PCT: 30 %
MCH: 26.5 pg (ref 26.0–34.0)
MCHC: 33.8 g/dL (ref 30.0–36.0)
MCV: 78.5 fL (ref 78.0–100.0)
MONO ABS: 0.4 10*3/uL (ref 0.1–1.0)
MONOS PCT: 9 %
NEUTROS ABS: 2.7 10*3/uL (ref 1.7–7.7)
NEUTROS PCT: 58 %
Platelets: 163 10*3/uL (ref 150–400)
RBC: 5.85 MIL/uL — ABNORMAL HIGH (ref 4.22–5.81)
RDW: 14 % (ref 11.5–15.5)
WBC: 4.6 10*3/uL (ref 4.0–10.5)

## 2017-01-26 LAB — IRON AND TIBC
IRON: 43 ug/dL — AB (ref 45–182)
Saturation Ratios: 10 % — ABNORMAL LOW (ref 17.9–39.5)
TIBC: 412 ug/dL (ref 250–450)
UIBC: 369 ug/dL

## 2017-01-26 LAB — VITAMIN B12: VITAMIN B 12: 378 pg/mL (ref 180–914)

## 2017-01-26 LAB — FERRITIN: Ferritin: 29 ng/mL (ref 24–336)

## 2017-01-27 LAB — VITAMIN D 25 HYDROXY (VIT D DEFICIENCY, FRACTURES): Vit D, 25-Hydroxy: 17.3 ng/mL — ABNORMAL LOW (ref 30.0–100.0)

## 2017-02-08 ENCOUNTER — Emergency Department (HOSPITAL_COMMUNITY): Payer: Medicaid Other

## 2017-02-08 ENCOUNTER — Encounter (HOSPITAL_COMMUNITY): Payer: Self-pay | Admitting: Emergency Medicine

## 2017-02-08 ENCOUNTER — Emergency Department (HOSPITAL_COMMUNITY)
Admission: EM | Admit: 2017-02-08 | Discharge: 2017-02-08 | Disposition: A | Discharge status: Home / Self Care | Payer: Medicaid Other | Attending: Emergency Medicine | Admitting: Emergency Medicine

## 2017-02-08 DIAGNOSIS — J45909 Unspecified asthma, uncomplicated: Secondary | ICD-10-CM | POA: Insufficient documentation

## 2017-02-08 DIAGNOSIS — Y939 Activity, unspecified: Secondary | ICD-10-CM | POA: Insufficient documentation

## 2017-02-08 DIAGNOSIS — F419 Anxiety disorder, unspecified: Secondary | ICD-10-CM

## 2017-02-08 DIAGNOSIS — Z79899 Other long term (current) drug therapy: Secondary | ICD-10-CM | POA: Insufficient documentation

## 2017-02-08 DIAGNOSIS — Y999 Unspecified external cause status: Secondary | ICD-10-CM | POA: Insufficient documentation

## 2017-02-08 DIAGNOSIS — Y92410 Unspecified street and highway as the place of occurrence of the external cause: Secondary | ICD-10-CM | POA: Diagnosis not present

## 2017-02-08 DIAGNOSIS — F17210 Nicotine dependence, cigarettes, uncomplicated: Secondary | ICD-10-CM | POA: Insufficient documentation

## 2017-02-08 DIAGNOSIS — S99921A Unspecified injury of right foot, initial encounter: Secondary | ICD-10-CM | POA: Diagnosis present

## 2017-02-08 DIAGNOSIS — S93601A Unspecified sprain of right foot, initial encounter: Secondary | ICD-10-CM | POA: Insufficient documentation

## 2017-02-08 MED ORDER — ALPRAZOLAM ER 2 MG PO TB24: 2.0000 mg | ORAL_TABLET | Freq: Two times a day (BID) | ORAL | 0 refills | Status: DC

## 2017-02-08 MED ORDER — OXYCODONE HCL 5 MG PO TABS
5.0000 mg | ORAL_TABLET | Freq: Once | ORAL | Status: AC
  Administered 2017-02-08: 5 mg via ORAL
  Filled 2017-02-08: qty 1

## 2017-02-08 MED ORDER — ALPRAZOLAM 0.5 MG PO TABS
1.0000 mg | ORAL_TABLET | Freq: Once | ORAL | Status: AC
  Administered 2017-02-08: 1 mg via ORAL
  Filled 2017-02-08: qty 2

## 2017-02-08 MED ORDER — OXYCODONE HCL 5 MG PO TABS: 5.0000 mg | ORAL_TABLET | ORAL | 0 refills | Status: DC | PRN

## 2017-02-08 MED ORDER — ALPRAZOLAM 0.5 MG PO TABS
0.5000 mg | ORAL_TABLET | Freq: Once | ORAL | Status: DC
  Filled 2017-02-08: qty 1

## 2017-02-08 NOTE — Discharge Instructions (Addendum)
Wear the post-op shoe, use crutches as needed. Use naproxen (Naprosyn) for less severe pain.
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
## 2017-02-12 ENCOUNTER — Encounter (HOSPITAL_COMMUNITY): Payer: Self-pay | Admitting: Adult Health

## 2017-02-12 ENCOUNTER — Emergency Department (HOSPITAL_COMMUNITY)
Admission: EM | Admit: 2017-02-12 | Discharge: 2017-02-12 | Disposition: A | Discharge status: Home Health | Payer: Medicaid Other | Attending: Emergency Medicine | Admitting: Emergency Medicine

## 2017-02-12 DIAGNOSIS — M79671 Pain in right foot: Secondary | ICD-10-CM

## 2017-02-12 DIAGNOSIS — Y929 Unspecified place or not applicable: Secondary | ICD-10-CM | POA: Insufficient documentation

## 2017-02-12 DIAGNOSIS — S99921A Unspecified injury of right foot, initial encounter: Secondary | ICD-10-CM | POA: Diagnosis present

## 2017-02-12 DIAGNOSIS — J45909 Unspecified asthma, uncomplicated: Secondary | ICD-10-CM | POA: Diagnosis not present

## 2017-02-12 DIAGNOSIS — Y939 Activity, unspecified: Secondary | ICD-10-CM | POA: Diagnosis not present

## 2017-02-12 DIAGNOSIS — F17210 Nicotine dependence, cigarettes, uncomplicated: Secondary | ICD-10-CM | POA: Diagnosis not present

## 2017-02-12 DIAGNOSIS — Y999 Unspecified external cause status: Secondary | ICD-10-CM | POA: Diagnosis not present

## 2017-02-12 DIAGNOSIS — X58XXXA Exposure to other specified factors, initial encounter: Secondary | ICD-10-CM | POA: Insufficient documentation

## 2017-02-12 MED ORDER — LIDOCAINE 5 % EX PTCH: 1.0000 | MEDICATED_PATCH | CUTANEOUS | 0 refills | Status: DC

## 2017-02-12 MED ORDER — LIDOCAINE 5 % EX PTCH
1.0000 | MEDICATED_PATCH | CUTANEOUS | Status: DC
  Administered 2017-02-12: 1 via TRANSDERMAL
  Filled 2017-02-12 (×3): qty 1

## 2017-02-12 MED ORDER — OXYCODONE HCL 5 MG PO TABS
5.0000 mg | ORAL_TABLET | Freq: Once | ORAL | Status: AC
Start: 2017-02-12 — End: 2017-02-12
  Administered 2017-02-12: 5 mg via ORAL
  Filled 2017-02-12: qty 1
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
## 2017-02-12 NOTE — ED Triage Notes (Addendum)
Pt complains R foot pain- Seen here "was only given a weeks worth of oxycodone"  Dr Roxy Horsemanondeigo is PCP  Pt noted by this RN and registration to walk in the department as well to ED with heel to toe ambulation as well as no limp, stagger, or drift

## 2017-02-12 NOTE — ED Notes (Signed)
Before patient was discharged, he wanted a personal belongings bag, peanut butter and crackers, and drinks to take with him.  I advised patient that I could not give him food to go.

## 2017-02-12 NOTE — Discharge Instructions (Signed)
As discussed, continue to use your ankle brace and the crutches you were given at your last visit here. Use ice and elevation to help with pain.  You have been prescribed a topical pain reliever patch which you can apply to your foot daily.  See your doctor on Monday as planned for followup care of this injury.
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
## 2017-03-03 ENCOUNTER — Emergency Department (HOSPITAL_COMMUNITY): Payer: Medicaid Other

## 2017-03-03 ENCOUNTER — Emergency Department (HOSPITAL_COMMUNITY)
Admission: EM | Admit: 2017-03-03 | Discharge: 2017-03-03 | Disposition: A | Discharge status: Home / Self Care | Payer: Medicaid Other | Source: Home / Self Care | Attending: Emergency Medicine | Admitting: Emergency Medicine

## 2017-03-03 ENCOUNTER — Emergency Department (HOSPITAL_COMMUNITY)
Admission: EM | Admit: 2017-03-03 | Discharge: 2017-03-03 | Disposition: A | Discharge status: Home / Self Care | Payer: Medicaid Other | Attending: Emergency Medicine | Admitting: Emergency Medicine

## 2017-03-03 ENCOUNTER — Encounter (HOSPITAL_COMMUNITY): Payer: Self-pay

## 2017-03-03 DIAGNOSIS — M25571 Pain in right ankle and joints of right foot: Secondary | ICD-10-CM

## 2017-03-03 DIAGNOSIS — F17210 Nicotine dependence, cigarettes, uncomplicated: Secondary | ICD-10-CM | POA: Insufficient documentation

## 2017-03-03 DIAGNOSIS — R52 Pain, unspecified: Secondary | ICD-10-CM

## 2017-03-03 DIAGNOSIS — J45909 Unspecified asthma, uncomplicated: Secondary | ICD-10-CM | POA: Diagnosis not present

## 2017-03-03 MED ORDER — TRAMADOL HCL 50 MG PO TABS: 50.0000 mg | ORAL_TABLET | Freq: Four times a day (QID) | ORAL | 0 refills | Status: DC | PRN

## 2017-03-03 MED ORDER — DIPHENHYDRAMINE HCL 12.5 MG/5ML PO SYRP: 12.5000 mg | ORAL_SOLUTION | Freq: Four times a day (QID) | ORAL | 1 refills | Status: DC | PRN

## 2017-03-03 MED ORDER — PREDNISONE 20 MG PO TABS
40.0000 mg | ORAL_TABLET | Freq: Once | ORAL | Status: AC
  Administered 2017-03-03: 40 mg via ORAL
  Filled 2017-03-03: qty 2

## 2017-03-03 MED ORDER — DIPHENHYDRAMINE HCL 25 MG PO CAPS
25.0000 mg | ORAL_CAPSULE | Freq: Once | ORAL | Status: AC
  Administered 2017-03-03: 25 mg via ORAL
  Filled 2017-03-03: qty 1

## 2017-03-03 MED ORDER — TRAMADOL HCL 50 MG PO TABS
100.0000 mg | ORAL_TABLET | Freq: Once | ORAL | Status: AC
  Administered 2017-03-03: 100 mg via ORAL
  Filled 2017-03-03: qty 2

## 2017-03-03 MED ORDER — DEXAMETHASONE 4 MG PO TABS: 4.0000 mg | ORAL_TABLET | Freq: Two times a day (BID) | ORAL | 0 refills | Status: DC

## 2017-03-03 NOTE — ED Triage Notes (Signed)
Pt [facility] due to right ankle pain. States he hurt ankle 2 weeks ago playing basketball. Pt had walked 2 miles this morning to friends house and ankle was hurting so called EMS. Once pt was here questioned if he would be out of ED in 30 mins because he had to go to work. Pt decides to leave and walk to work to tell them he was going to be seen in ED. Pt states he would return later.  Pt ambulated out of ED

## 2017-03-03 NOTE — Discharge Instructions (Signed)
Your x-ray is negative for new fracture or dislocation. There is no fluid in the joint. No significant change in the old fracture visualized on this x-ray.  Please see your primary physician to finalize the plans for your seeing a specialist. Use Decadron 2 times daily with food. Use Ultram every 6 hours if needed for pain. May use Benadryl every 6 hours along with it if any itching. Benadryl and Ultram may cause drowsiness. Please do not drive, operate machinery, drink alcohol, participate in activities requiring concentration when taking Benadryl and Ultram.

## 2017-03-03 NOTE — ED Notes (Signed)
Pt c/o right foot and ankle pain that started 2 weeks ago. Pt reports he has been seen by his PCP, Dr. Janna ArchonDiego, and has been taking Neurontin and Naproxen with minimal relief. Pt has appt set up for Friday to see Dr. Romeo AppleHarrison. Pt is ambulatory on foot/ankle, but reports it is painful.

## 2017-03-03 NOTE — ED Triage Notes (Signed)
Pt reports he hurt his right ankle 2 weeks ago and continues to have pain. Reports that he has been seen by Dr Romeo AppleHarrison
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
## 2017-03-13 ENCOUNTER — Emergency Department (HOSPITAL_COMMUNITY)
Admission: EM | Admit: 2017-03-13 | Discharge: 2017-03-13 | Disposition: A | Discharge status: Home / Self Care | Payer: Medicaid Other | Attending: Emergency Medicine | Admitting: Emergency Medicine

## 2017-03-13 ENCOUNTER — Encounter (HOSPITAL_COMMUNITY): Payer: Self-pay | Admitting: Emergency Medicine

## 2017-03-13 DIAGNOSIS — Y999 Unspecified external cause status: Secondary | ICD-10-CM | POA: Insufficient documentation

## 2017-03-13 DIAGNOSIS — B1710 Acute hepatitis C without hepatic coma: Secondary | ICD-10-CM | POA: Insufficient documentation

## 2017-03-13 DIAGNOSIS — S20411A Abrasion of right back wall of thorax, initial encounter: Secondary | ICD-10-CM | POA: Diagnosis not present

## 2017-03-13 DIAGNOSIS — Y929 Unspecified place or not applicable: Secondary | ICD-10-CM | POA: Insufficient documentation

## 2017-03-13 DIAGNOSIS — F17210 Nicotine dependence, cigarettes, uncomplicated: Secondary | ICD-10-CM | POA: Diagnosis not present

## 2017-03-13 DIAGNOSIS — Z79899 Other long term (current) drug therapy: Secondary | ICD-10-CM | POA: Insufficient documentation

## 2017-03-13 DIAGNOSIS — S299XXA Unspecified injury of thorax, initial encounter: Secondary | ICD-10-CM | POA: Diagnosis present

## 2017-03-13 DIAGNOSIS — J45909 Unspecified asthma, uncomplicated: Secondary | ICD-10-CM | POA: Diagnosis not present

## 2017-03-13 DIAGNOSIS — Y939 Activity, unspecified: Secondary | ICD-10-CM | POA: Diagnosis not present

## 2017-03-13 DIAGNOSIS — S00511A Abrasion of lip, initial encounter: Secondary | ICD-10-CM | POA: Diagnosis not present

## 2017-03-13 DIAGNOSIS — Z87898 Personal history of other specified conditions: Secondary | ICD-10-CM | POA: Insufficient documentation
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
## 2017-03-13 NOTE — Discharge Instructions (Signed)
Expect your soreness to increase over the next 2-3 days. Take it easy, but do not lay around too much as this may make any stiffness worse.  °Antiinflammatory medications: Take 600 mg of ibuprofen every 6 hours or 440 mg (over the counter dose) to 500 mg (prescription dose) of naproxen every 12 hours or for the next 3 days. After this time, these medications may be used as needed for pain. Take these medications with food to avoid upset stomach. Choose only one of these medications, do not take them together.  ° °Muscle relaxer: Robaxin is a muscle relaxer and may help loosen stiff muscles. Do not take the Robaxin while driving or performing other dangerous activities.  ° °Lidocaine patches: These are available via either prescription or over-the-counter. The over-the-counter option may be more economical one and are likely just as effective. There are multiple over-the-counter brands, such as Salonpas. ° °Exercises: Be sure to perform the attached exercises starting with three times a week and working up to performing them daily. This is an essential part of preventing long term problems.  ° °Follow up with a primary care provider for any future management of these complaints. °
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
## 2017-03-30 ENCOUNTER — Encounter: Payer: Self-pay | Admitting: Orthopaedic Surgery

## 2017-04-01 ENCOUNTER — Encounter (HOSPITAL_COMMUNITY): Payer: Self-pay | Admitting: Emergency Medicine

## 2017-04-01 ENCOUNTER — Emergency Department (HOSPITAL_COMMUNITY)
Admission: EM | Admit: 2017-04-01 | Discharge: 2017-04-01 | Discharge status: Against Medical Advice | Payer: Medicaid Other | Attending: Emergency Medicine | Admitting: Emergency Medicine

## 2017-04-01 DIAGNOSIS — Z5321 Procedure and treatment not carried out due to patient leaving prior to being seen by health care provider: Secondary | ICD-10-CM | POA: Insufficient documentation

## 2017-04-01 DIAGNOSIS — M545 Low back pain: Secondary | ICD-10-CM | POA: Diagnosis present

## 2017-04-01 NOTE — ED Notes (Signed)
Pt left without being seen.  He told registration he would be right back and has not returned.

## 2017-04-01 NOTE — ED Triage Notes (Signed)
Per EMS pt is having lower lumbar back pain.  Pt states that is has been hurting, but has worsened due to topping tobacco.  Pt has a history of anxiety and chronic back pain.

## 2017-04-09 ENCOUNTER — Emergency Department (HOSPITAL_COMMUNITY): Payer: Medicaid Other

## 2017-04-09 ENCOUNTER — Emergency Department (HOSPITAL_COMMUNITY)
Admission: EM | Admit: 2017-04-09 | Discharge: 2017-04-09 | Disposition: A | Discharge status: Home / Self Care | Payer: Medicaid Other | Attending: Emergency Medicine | Admitting: Emergency Medicine

## 2017-04-09 ENCOUNTER — Encounter (HOSPITAL_COMMUNITY): Payer: Self-pay | Admitting: Emergency Medicine

## 2017-04-09 DIAGNOSIS — S0990XA Unspecified injury of head, initial encounter: Secondary | ICD-10-CM | POA: Diagnosis present

## 2017-04-09 DIAGNOSIS — M791 Myalgia: Secondary | ICD-10-CM | POA: Insufficient documentation

## 2017-04-09 DIAGNOSIS — S62619A Displaced fracture of proximal phalanx of unspecified finger, initial encounter for closed fracture: Secondary | ICD-10-CM | POA: Insufficient documentation

## 2017-04-09 DIAGNOSIS — J45909 Unspecified asthma, uncomplicated: Secondary | ICD-10-CM | POA: Insufficient documentation

## 2017-04-09 DIAGNOSIS — Z79899 Other long term (current) drug therapy: Secondary | ICD-10-CM | POA: Insufficient documentation

## 2017-04-09 DIAGNOSIS — Y939 Activity, unspecified: Secondary | ICD-10-CM | POA: Insufficient documentation

## 2017-04-09 DIAGNOSIS — F17210 Nicotine dependence, cigarettes, uncomplicated: Secondary | ICD-10-CM | POA: Insufficient documentation

## 2017-04-09 DIAGNOSIS — Y929 Unspecified place or not applicable: Secondary | ICD-10-CM | POA: Insufficient documentation

## 2017-04-09 DIAGNOSIS — Y999 Unspecified external cause status: Secondary | ICD-10-CM | POA: Diagnosis not present

## 2017-04-09 MED ORDER — HYDROGEN PEROXIDE 3 % EX SOLN
Freq: Once | CUTANEOUS | Status: AC
  Administered 2017-04-09: 01:00:00 via TOPICAL

## 2017-04-09 MED ORDER — LIDOCAINE-EPINEPHRINE (PF) 1 %-1:200000 IJ SOLN
20.0000 mL | Freq: Once | INTRAMUSCULAR | Status: AC
  Administered 2017-04-09: 20 mL
  Filled 2017-04-09: qty 30

## 2017-04-09 MED ORDER — OXYCODONE HCL 5 MG PO TABS
5.0000 mg | ORAL_TABLET | Freq: Once | ORAL | Status: AC
  Administered 2017-04-09: 5 mg via ORAL
  Filled 2017-04-09: qty 1

## 2017-04-09 MED ORDER — HYDROGEN PEROXIDE 3 % EX SOLN
CUTANEOUS | Status: AC
  Filled 2017-04-09: qty 473
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
## 2017-04-09 NOTE — ED Notes (Signed)
Pt transported to CT/Radiology by Radiology Tech

## 2017-04-09 NOTE — ED Notes (Addendum)
Discharge AVS reviewed with pt.  PT gives verbalized understandings.  Pt states he has no ride home and he will walk to a friends home nearby.  Pt informed he can wait until daybreak or until someone is able to pick him up this morning.  Pt requesting PRN analgesia prescription for scalp and hand pain.  Dr Manus Gunningancour notified.

## 2017-04-09 NOTE — ED Notes (Signed)
ED MD at bedside evaluating pt.

## 2017-04-09 NOTE — ED Notes (Signed)
Finger splints applied per MD order
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
## 2017-04-09 NOTE — Discharge Instructions (Signed)
Follow up in one week for removal of your staples. Follow-up with Dr. Romeo AppleHarrison for further evaluation of your finger fractures. Return to the ED if you develop new or worsening symptoms.

## 2017-04-09 NOTE — ED Provider Notes (Signed)
Pt of Dr. Randel Booksancour's, I was asked to repair scalp lacerations.  LACERATION REPAIR Performed by: Burgess AmorIDOL, Demarie Hyneman Authorized by: Burgess AmorIDOL, Labron Bloodgood Consent: Verbal consent obtained. Risks and benefits: risks, benefits and alternatives were discussed Consent given by: patient Patient identity confirmed: provided demographic data Prepped and Draped in normal sterile fashion Wound explored  Laceration Location: left parietal scalp  Laceration Length: 2 cm, stellate laceration  No Foreign Bodies seen or palpated  Anesthesia: local infiltration  Local anesthetic: lidocaine 1% with epinephrine  Anesthetic total: 3 ml  Irrigation method: syringe Amount of cleaning: standard  Skin closure: staples  Number of staples: 5  Technique: staples  Patient tolerance: Patient tolerated the procedure well with no immediate complications.     LACERATION REPAIR - right parietal scalp Performed by: Tommie Dejoseph Authorized by: Burgess AmorIDOL, Phyllip Claw Consent: Verbal consent obtained. Risks and benefits: risks, benefits and alternatives were discussed Consent given by: patient Patient identity confirmed: provided demographic data Prepped and Draped in normal sterile fashion Wound explored  Laceration Location: right parietal scalp  Laceration Length: 1 cm  No Foreign Bodies seen or palpated  Anesthesia: local infiltration  Local anesthetic: lidocaine 1% with epinephrine  Anesthetic total: 2 ml  Irrigation method: syringe Amount of cleaning: standard  Skin closure: staples  Number of staples : 2  Technique: staples  Patient tolerance: Patient tolerated the procedure well with no immediate complications.     Burgess Amordol, Bethany Cumming, PA-C 04/09/17 0124    Glynn Octaveancour, Stephen, MD 04/09/17 [phone]

## 2017-04-09 NOTE — ED Notes (Signed)
Pt discharged ambulatory from ED with all belongings.

## 2017-04-12 ENCOUNTER — Encounter (HOSPITAL_COMMUNITY): Payer: Self-pay | Admitting: *Deleted

## 2017-04-12 ENCOUNTER — Emergency Department (HOSPITAL_COMMUNITY)
Admission: EM | Admit: 2017-04-12 | Discharge: 2017-04-12 | Disposition: A | Discharge status: Home / Self Care | Payer: Medicaid Other | Attending: Emergency Medicine | Admitting: Emergency Medicine

## 2017-04-12 DIAGNOSIS — F17210 Nicotine dependence, cigarettes, uncomplicated: Secondary | ICD-10-CM | POA: Diagnosis not present

## 2017-04-12 DIAGNOSIS — Y9389 Activity, other specified: Secondary | ICD-10-CM | POA: Insufficient documentation

## 2017-04-12 DIAGNOSIS — M79641 Pain in right hand: Secondary | ICD-10-CM | POA: Insufficient documentation

## 2017-04-12 DIAGNOSIS — R52 Pain, unspecified: Secondary | ICD-10-CM

## 2017-04-12 DIAGNOSIS — Y929 Unspecified place or not applicable: Secondary | ICD-10-CM | POA: Diagnosis not present

## 2017-04-12 DIAGNOSIS — M545 Low back pain: Secondary | ICD-10-CM | POA: Insufficient documentation

## 2017-04-12 DIAGNOSIS — Z79899 Other long term (current) drug therapy: Secondary | ICD-10-CM | POA: Diagnosis not present

## 2017-04-12 DIAGNOSIS — J45909 Unspecified asthma, uncomplicated: Secondary | ICD-10-CM | POA: Insufficient documentation

## 2017-04-12 DIAGNOSIS — Y998 Other external cause status: Secondary | ICD-10-CM | POA: Diagnosis not present

## 2017-04-12 MED ORDER — HYDROCODONE-ACETAMINOPHEN 5-325 MG PO TABS: 1.0000 | ORAL_TABLET | Freq: Four times a day (QID) | ORAL | 0 refills | Status: DC | PRN

## 2017-04-12 MED ORDER — CYCLOBENZAPRINE HCL 10 MG PO TABS
5.0000 mg | ORAL_TABLET | Freq: Once | ORAL | Status: AC
  Administered 2017-04-12: 5 mg via ORAL
  Filled 2017-04-12: qty 1

## 2017-04-12 MED ORDER — CYCLOBENZAPRINE HCL 5 MG PO TABS: 5.0000 mg | ORAL_TABLET | Freq: Two times a day (BID) | ORAL | 0 refills | Status: DC | PRN

## 2017-04-12 MED ORDER — KETOROLAC TROMETHAMINE 30 MG/ML IJ SOLN
30.0000 mg | Freq: Once | INTRAMUSCULAR | Status: AC
  Administered 2017-04-12: 30 mg via INTRAMUSCULAR
  Filled 2017-04-12: qty 1
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
## 2017-04-19 ENCOUNTER — Emergency Department (HOSPITAL_COMMUNITY)
Admission: EM | Admit: 2017-04-19 | Discharge: 2017-04-19 | Disposition: A | Discharge status: Home / Self Care | Payer: Medicaid Other | Attending: Emergency Medicine | Admitting: Emergency Medicine

## 2017-04-19 ENCOUNTER — Encounter (HOSPITAL_COMMUNITY): Payer: Self-pay | Admitting: *Deleted

## 2017-04-19 DIAGNOSIS — F17210 Nicotine dependence, cigarettes, uncomplicated: Secondary | ICD-10-CM | POA: Insufficient documentation

## 2017-04-19 DIAGNOSIS — J45909 Unspecified asthma, uncomplicated: Secondary | ICD-10-CM | POA: Diagnosis not present

## 2017-04-19 DIAGNOSIS — Z4802 Encounter for removal of sutures: Secondary | ICD-10-CM | POA: Diagnosis not present

## 2017-04-19 DIAGNOSIS — S62349D Nondisplaced fracture of base of unspecified metacarpal bone, subsequent encounter for fracture with routine healing: Secondary | ICD-10-CM | POA: Diagnosis not present

## 2017-04-19 DIAGNOSIS — R519 Headache, unspecified: Secondary | ICD-10-CM

## 2017-04-19 DIAGNOSIS — Z79899 Other long term (current) drug therapy: Secondary | ICD-10-CM | POA: Insufficient documentation

## 2017-04-19 DIAGNOSIS — R51 Headache: Secondary | ICD-10-CM | POA: Diagnosis not present

## 2017-04-19 MED ORDER — DICLOFENAC SODIUM 25 MG PO TBEC
25.0000 mg | DELAYED_RELEASE_TABLET | Freq: Once | ORAL | Status: DC
  Filled 2017-04-19: qty 1

## 2017-04-19 MED ORDER — DICLOFENAC SODIUM 50 MG PO TBEC
50.0000 mg | DELAYED_RELEASE_TABLET | Freq: Once | ORAL | Status: AC
  Administered 2017-04-19: 50 mg via ORAL

## 2017-04-19 MED ORDER — DICLOFENAC SODIUM 50 MG PO TBEC
DELAYED_RELEASE_TABLET | ORAL | Status: AC
  Filled 2017-04-19: qty 1

## 2017-04-19 MED ORDER — DICLOFENAC SODIUM 50 MG PO TBEC: 50.0000 mg | DELAYED_RELEASE_TABLET | Freq: Two times a day (BID) | ORAL | 0 refills | Status: DC
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
## 2017-04-21 ENCOUNTER — Encounter: Payer: Self-pay | Admitting: Orthopaedic Surgery

## 2017-04-21 ENCOUNTER — Ambulatory Visit (INDEPENDENT_AMBULATORY_CARE_PROVIDER_SITE_OTHER): Payer: Medicaid Other | Admitting: Orthopaedic Surgery

## 2017-04-21 VITALS — BP 139/89 | HR 71 | Temp 97.5°F | Ht 71.0 in | Wt 187.0 lb

## 2017-04-21 DIAGNOSIS — F17210 Nicotine dependence, cigarettes, uncomplicated: Secondary | ICD-10-CM

## 2017-04-21 DIAGNOSIS — S62610A Displaced fracture of proximal phalanx of right index finger, initial encounter for closed fracture: Secondary | ICD-10-CM | POA: Diagnosis not present

## 2017-04-21 DIAGNOSIS — M21961 Unspecified acquired deformity of right lower leg: Secondary | ICD-10-CM

## 2017-04-21 DIAGNOSIS — S62644A Nondisplaced fracture of proximal phalanx of right ring finger, initial encounter for closed fracture: Secondary | ICD-10-CM | POA: Diagnosis not present
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
## 2017-04-21 NOTE — Patient Instructions (Addendum)
Steps to Quit Smoking Smoking tobacco can be bad for your health. It can also affect almost every organ in your body. Smoking puts you and people around you at risk for many serious Cole [name]-lasting (chronic) diseases. Quitting smoking is hard, but it is one of the best things that you can do for your health. It is never too late to quit. What are the benefits of quitting smoking? When you quit smoking, you lower your risk for getting serious diseases and conditions. They can include:  Lung cancer or lung disease.  Heart disease.  Stroke.  Heart attack.  Not being able to have children (infertility).  Weak bones (osteoporosis) and broken bones (fractures).  If you have coughing, wheezing, and shortness of breath, those symptoms may get better when you quit. You may also get sick less often. If you are pregnant, quitting smoking can help to lower your chances of having a baby of low birth weight. What can I do to help me quit smoking? Talk with your doctor about what can help you quit smoking. Some things you can do (strategies) include:  Quitting smoking totally, instead of slowly cutting back how much you smoke over a period of time.  Going to in-person counseling. You are more likely to quit if you go to many counseling sessions.  Using resources and support systems, such as: ? Online chats with a counselor. ? Phone quitlines. ? Printed self-help materials. ? Support groups or group counseling. ? Text messaging programs. ? Mobile phone apps or applications.  Taking medicines. Some of these medicines may have nicotine in them. If you are pregnant or breastfeeding, do not take any medicines to quit smoking unless your doctor says it is okay. Talk with your doctor about counseling or other things that can help you.  Talk with your doctor about using more than one strategy at the same time, such as taking medicines while you are also going to in-person counseling. This can help make  quitting easier. What things can I do to make it easier to quit? Quitting smoking might feel very hard at first, but there is a lot that you can do to make it easier. Take these steps:  Talk to your family and friends. Ask them to support and encourage you.  Call phone quitlines, reach out to support groups, or work with a counselor.  Ask people who smoke to not smoke around you.  Avoid places that make you want (trigger) to smoke, such as: ? Bars. ? Parties. ? Smoke-break areas at work.  Spend time with people who do not smoke.  Lower the stress in your life. Stress can make you want to smoke. Try these things to help your stress: ? Getting regular exercise. ? Deep-breathing exercises. ? Yoga. ? Meditating. ? Doing a body scan. To do this, close your eyes, focus on one area of your body at a time from head to toe, and notice which parts of your body are tense. Try to relax the muscles in those areas.  Download or buy apps on your mobile phone or tablet that can help you stick to your quit plan. There are many free apps, such as QuitGuide from the CDC (Centers for Disease Control and Prevention). You can find more support from smokefree.gov and other websites.  This information is not intended to replace advice given to you by your health care provider. Make sure you discuss any questions you have with your health care provider. Document Released: 06/12/2009 Document   Revised: 04/13/2016 Document Reviewed: 12/31/2014 [employer] Interactive Patient Education  2018 [employer]  

## 2017-04-24 ENCOUNTER — Emergency Department (HOSPITAL_COMMUNITY)
Admission: EM | Admit: 2017-04-24 | Discharge: 2017-04-24 | Disposition: A | Discharge status: Home / Self Care | Payer: Medicaid Other | Attending: Emergency Medicine | Admitting: Emergency Medicine

## 2017-04-24 ENCOUNTER — Emergency Department (HOSPITAL_COMMUNITY): Payer: Medicaid Other

## 2017-04-24 DIAGNOSIS — J45909 Unspecified asthma, uncomplicated: Secondary | ICD-10-CM | POA: Diagnosis not present

## 2017-04-24 DIAGNOSIS — Z79899 Other long term (current) drug therapy: Secondary | ICD-10-CM | POA: Diagnosis not present

## 2017-04-24 DIAGNOSIS — F17210 Nicotine dependence, cigarettes, uncomplicated: Secondary | ICD-10-CM | POA: Diagnosis not present

## 2017-04-24 DIAGNOSIS — M79641 Pain in right hand: Secondary | ICD-10-CM | POA: Insufficient documentation

## 2017-04-24 DIAGNOSIS — M79671 Pain in right foot: Secondary | ICD-10-CM | POA: Diagnosis not present

## 2017-04-24 MED ORDER — OXYCODONE HCL 5 MG PO TABS: 5.0000 mg | ORAL_TABLET | Freq: Four times a day (QID) | ORAL | 0 refills | Status: DC | PRN

## 2017-04-24 MED ORDER — OXYCODONE HCL 5 MG PO TABS
5.0000 mg | ORAL_TABLET | Freq: Once | ORAL | Status: AC
  Administered 2017-04-24: 5 mg via ORAL
  Filled 2017-04-24: qty 1
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
## 2017-04-25 ENCOUNTER — Telehealth: Payer: Self-pay | Admitting: Orthopaedic Surgery

## 2017-04-25 NOTE — Telephone Encounter (Signed)
Patient wants refill on Oxycodone/Roxicodone) 5 mg.  Immediate Release Tablets  Sig: Take 1 tablet (5 mg total) by mouth every 6 (six) hours as needed for severe pain.

## 2017-04-26 ENCOUNTER — Emergency Department (HOSPITAL_COMMUNITY)
Admission: EM | Admit: 2017-04-26 | Discharge: 2017-04-26 | Disposition: A | Discharge status: Home / Self Care | Payer: Medicaid Other | Source: Home / Self Care | Attending: Emergency Medicine | Admitting: Emergency Medicine

## 2017-04-26 ENCOUNTER — Encounter (HOSPITAL_COMMUNITY): Payer: Self-pay | Admitting: Emergency Medicine

## 2017-04-26 ENCOUNTER — Emergency Department (HOSPITAL_COMMUNITY)
Admission: EM | Admit: 2017-04-26 | Discharge: 2017-04-26 | Disposition: A | Discharge status: Home / Self Care | Payer: Medicaid Other | Attending: Emergency Medicine | Admitting: Emergency Medicine

## 2017-04-26 DIAGNOSIS — Y939 Activity, unspecified: Secondary | ICD-10-CM | POA: Diagnosis not present

## 2017-04-26 DIAGNOSIS — S62640D Nondisplaced fracture of proximal phalanx of right index finger, subsequent encounter for fracture with routine healing: Secondary | ICD-10-CM | POA: Diagnosis not present

## 2017-04-26 DIAGNOSIS — M25561 Pain in right knee: Secondary | ICD-10-CM | POA: Insufficient documentation

## 2017-04-26 DIAGNOSIS — F17210 Nicotine dependence, cigarettes, uncomplicated: Secondary | ICD-10-CM | POA: Diagnosis not present

## 2017-04-26 DIAGNOSIS — S62644D Nondisplaced fracture of proximal phalanx of right ring finger, subsequent encounter for fracture with routine healing: Secondary | ICD-10-CM | POA: Insufficient documentation

## 2017-04-26 DIAGNOSIS — Z79899 Other long term (current) drug therapy: Secondary | ICD-10-CM | POA: Diagnosis not present

## 2017-04-26 DIAGNOSIS — G8929 Other chronic pain: Secondary | ICD-10-CM

## 2017-04-26 DIAGNOSIS — M545 Low back pain: Secondary | ICD-10-CM

## 2017-04-26 DIAGNOSIS — Y999 Unspecified external cause status: Secondary | ICD-10-CM | POA: Insufficient documentation

## 2017-04-26 DIAGNOSIS — Y929 Unspecified place or not applicable: Secondary | ICD-10-CM | POA: Insufficient documentation

## 2017-04-26 DIAGNOSIS — S6991XD Unspecified injury of right wrist, hand and finger(s), subsequent encounter: Secondary | ICD-10-CM | POA: Diagnosis present

## 2017-04-26 MED ORDER — DICLOFENAC SODIUM 75 MG PO TBEC: 75.0000 mg | DELAYED_RELEASE_TABLET | Freq: Two times a day (BID) | ORAL | 0 refills | Status: DC

## 2017-04-26 MED ORDER — KETOROLAC TROMETHAMINE 10 MG PO TABS
10.0000 mg | ORAL_TABLET | Freq: Once | ORAL | Status: AC
  Administered 2017-04-26: 10 mg via ORAL
  Filled 2017-04-26: qty 1

## 2017-04-26 MED ORDER — KETOROLAC TROMETHAMINE 60 MG/2ML IM SOLN: 60.0000 mg | Freq: Once | INTRAMUSCULAR | Status: DC

## 2017-04-26 NOTE — ED Notes (Signed)
Pt up to desk without difficulty .  States he is leaving because he has to be at work at [employer].  [place] he will come back after work.

## 2017-04-26 NOTE — ED Notes (Signed)
Pt says he's here to get a work note.  Says he was here this am but had to leave, "I just need a work note."
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
## 2017-04-26 NOTE — Telephone Encounter (Signed)
No narcotics 

## 2017-04-26 NOTE — ED Triage Notes (Signed)
Lower middle back pain that started last night.

## 2017-04-26 NOTE — ED Triage Notes (Signed)
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

## 2017-04-26 NOTE — Discharge Instructions (Signed)
It is important for you to wear your splint at all times to protect your injuries.  Keep your appointments as have been arranged.
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
## 2017-05-06 ENCOUNTER — Emergency Department (HOSPITAL_COMMUNITY)
Admission: EM | Admit: 2017-05-06 | Discharge: 2017-05-06 | Discharge status: Against Medical Advice | Payer: Medicaid Other | Attending: Emergency Medicine | Admitting: Emergency Medicine

## 2017-05-06 ENCOUNTER — Encounter (HOSPITAL_COMMUNITY): Payer: Self-pay | Admitting: Emergency Medicine

## 2017-05-06 DIAGNOSIS — M79643 Pain in unspecified hand: Secondary | ICD-10-CM | POA: Insufficient documentation

## 2017-05-06 DIAGNOSIS — M545 Low back pain, unspecified: Secondary | ICD-10-CM

## 2017-05-06 DIAGNOSIS — Z5321 Procedure and treatment not carried out due to patient leaving prior to being seen by health care provider: Secondary | ICD-10-CM | POA: Diagnosis not present

## 2017-05-06 NOTE — ED Triage Notes (Signed)
Patient complains of lower back and right hand pain x 3 weeks. Patient states he was diagnosed with a right index finger fracture 3 weeks ago from a fall.

## 2017-05-06 NOTE — ED Notes (Signed)
Patient stated he could not wait and had to go to work. I asked patient where he worked and he stated that it was only down the road, and left.

## 2017-05-09 ENCOUNTER — Ambulatory Visit: Payer: Medicaid Other | Admitting: Podiatry

## 2017-05-10 ENCOUNTER — Emergency Department (HOSPITAL_COMMUNITY)
Admission: EM | Admit: 2017-05-10 | Discharge: 2017-05-10 | Disposition: A | Discharge status: Home / Self Care | Payer: Medicaid Other | Attending: Emergency Medicine | Admitting: Emergency Medicine

## 2017-05-10 ENCOUNTER — Encounter (HOSPITAL_COMMUNITY): Payer: Self-pay | Admitting: Cardiology

## 2017-05-10 DIAGNOSIS — M545 Low back pain: Secondary | ICD-10-CM | POA: Insufficient documentation

## 2017-05-10 DIAGNOSIS — G8929 Other chronic pain: Secondary | ICD-10-CM | POA: Diagnosis not present

## 2017-05-10 DIAGNOSIS — F17210 Nicotine dependence, cigarettes, uncomplicated: Secondary | ICD-10-CM | POA: Diagnosis not present

## 2017-05-10 DIAGNOSIS — J45909 Unspecified asthma, uncomplicated: Secondary | ICD-10-CM | POA: Diagnosis not present

## 2017-05-10 DIAGNOSIS — Z79899 Other long term (current) drug therapy: Secondary | ICD-10-CM | POA: Diagnosis not present

## 2017-05-10 DIAGNOSIS — M549 Dorsalgia, unspecified: Secondary | ICD-10-CM | POA: Diagnosis present

## 2017-05-10 MED ORDER — METHOCARBAMOL 500 MG PO TABS: 1000.0000 mg | ORAL_TABLET | Freq: Once | ORAL | Status: DC

## 2017-05-10 MED ORDER — METHOCARBAMOL 500 MG PO TABS: 500.0000 mg | ORAL_TABLET | Freq: Two times a day (BID) | ORAL | 0 refills | Status: DC

## 2017-05-10 NOTE — ED Triage Notes (Signed)
Here by EMS for back pain 2-3 days.  States he hurt his back lifting weights.
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
## 2017-05-10 NOTE — Discharge Instructions (Signed)
Follow-up with your pain management physician on Thursday at your self-described appointment time

## 2017-05-16 ENCOUNTER — Encounter: Payer: Self-pay | Admitting: Podiatry

## 2017-05-16 ENCOUNTER — Ambulatory Visit: Payer: Medicaid Other

## 2017-05-16 ENCOUNTER — Ambulatory Visit (INDEPENDENT_AMBULATORY_CARE_PROVIDER_SITE_OTHER): Payer: Medicaid Other | Admitting: Podiatry

## 2017-05-16 DIAGNOSIS — M898X7 Other specified disorders of bone, ankle and foot: Secondary | ICD-10-CM

## 2017-05-16 DIAGNOSIS — M792 Neuralgia and neuritis, unspecified: Secondary | ICD-10-CM

## 2017-05-16 DIAGNOSIS — M79671 Pain in right foot: Secondary | ICD-10-CM

## 2017-05-16 NOTE — Progress Notes (Signed)
   Subjective:    Patient ID: Cole [name], male    DOB: 1981-03-21, 36 y.o.   MRN: [id number]  HPI    Review of Systems  Musculoskeletal: Positive for arthralgias, back pain and myalgias.  Neurological: Positive for numbness and headaches.  Hematological: Positive for adenopathy. Bruises/bleeds easily.  All other systems reviewed and are negative.      Objective:   Physical Exam        Assessment & Plan:

## 2017-05-16 NOTE — Patient Instructions (Signed)
Pre-Operative Instructions  Congratulations, you have decided to take an important step towards improving your quality of life.  You can be assured that the doctors and staff at [facility] will be with you every step of the way.  Here are some important things you should know:  1. Plan to be at the surgery center/hospital at least 1 (one) hour prior to your scheduled time, unless otherwise directed by the surgical center/hospital staff.  You must have a responsible adult accompany you, remain during the surgery and drive you home.  Make sure you have directions to the surgical center/hospital to ensure you arrive on time. 2. If you are having surgery at [facility] or [facility], you will need a copy of your medical history and physical form from your family physician within one month prior to the date of surgery. We will give you a form for your primary physician to complete.  3. We make every effort to accommodate the date you request for surgery.  However, there are times where surgery dates or times have to be moved.  We will contact you as soon as possible if a change in schedule is required.   4. No aspirin/ibuprofen for one week before surgery.  If you are on aspirin, any non-steroidal anti-inflammatory medications (Mobic, Aleve, Ibuprofen) should not be taken seven (7) days prior to your surgery.  You make take Tylenol for pain prior to surgery.  5. Medications - If you are taking daily heart and blood pressure medications, seizure, reflux, allergy, asthma, anxiety, pain or diabetes medications, make sure you notify the surgery center/hospital before the day of surgery so they can tell you which medications you should take or avoid the day of surgery. 6. No food or drink after midnight the night before surgery unless directed otherwise by surgical center/hospital staff. 7. No alcoholic beverages 24-hours prior to surgery.  No smoking 24-hours prior or 24-hours after  surgery. 8. Wear loose pants or shorts. They should be loose enough to fit over bandages, boots, and casts. 9. Don't wear slip-on shoes. Sneakers are preferred. 10. Bring your boot with you to the surgery center/hospital.  Also bring crutches or a walker if your physician has prescribed it for you.  If you do not have this equipment, it will be provided for you after surgery. 11. If you have not been contacted by the surgery center/hospital by the day before your surgery, call to confirm the date and time of your surgery. 12. Leave-time from work may vary depending on the type of surgery you have.  Appropriate arrangements should be made prior to surgery with your employer. 13. Prescriptions will be provided immediately following surgery by your doctor.  Fill these as soon as possible after surgery and take the medication as directed. Pain medications will not be refilled on weekends and must be approved by the doctor. 14. Remove nail polish on the operative foot and avoid getting pedicures prior to surgery. 15. Wash the night before surgery.  The night before surgery wash the foot and leg well with water and the antibacterial soap provided. Be sure to pay special attention to beneath the toenails and in between the toes.  Wash for at least three (3) minutes. Rinse thoroughly with water and dry well with a towel.  Perform this wash unless told not to do so by your physician.  Enclosed: 1 Ice pack (please put in freezer the night before surgery)   1 Hibiclens skin cleaner     Pre-op instructions  If you have any questions regarding the instructions, please do not hesitate to call our office.  [place]: [address], [place], [place] [address] -- [phone]  [place]: [address] [place], [place] [address] -- [phone]  [place]: [address]  [place], [place] [address] -- [phone]  [place]: [address], [address], [place], [place] [address] -- [phone]  Website:  [email or web address] 
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
## 2017-05-17 ENCOUNTER — Telehealth: Payer: Self-pay | Admitting: Orthopaedic Surgery

## 2017-05-17 ENCOUNTER — Ambulatory Visit (INDEPENDENT_AMBULATORY_CARE_PROVIDER_SITE_OTHER): Payer: Medicaid Other | Admitting: Orthopaedic Surgery

## 2017-05-17 ENCOUNTER — Ambulatory Visit (INDEPENDENT_AMBULATORY_CARE_PROVIDER_SITE_OTHER): Payer: Medicaid Other

## 2017-05-17 DIAGNOSIS — S62610D Displaced fracture of proximal phalanx of right index finger, subsequent encounter for fracture with routine healing: Secondary | ICD-10-CM

## 2017-05-17 DIAGNOSIS — S62644D Nondisplaced fracture of proximal phalanx of right ring finger, subsequent encounter for fracture with routine healing: Secondary | ICD-10-CM

## 2017-05-17 DIAGNOSIS — F17210 Nicotine dependence, cigarettes, uncomplicated: Secondary | ICD-10-CM

## 2017-05-17 MED ORDER — HYDROCODONE-ACETAMINOPHEN 5-325 MG PO TABS: ORAL_TABLET | ORAL | 0 refills | Status: DC
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
## 2017-05-17 NOTE — Telephone Encounter (Signed)
Pt walked in today stating that his hand was hurting and he needed something for pain.  I reminded him that when we last asked, Dr. Hilda Lias stated he wouldn't give this pt anymore narcotics.  He said that his hand was hurting and that he couldn't take over the counter Advil or Aleve.  He wanted me to ask again about pain medication or what he could do about his hand hurting.  I told him I would send a message  Please advise  Thanks

## 2017-05-17 NOTE — Patient Instructions (Signed)
Steps to Quit Smoking Smoking tobacco can be bad for your health. It can also affect almost every organ in your body. Smoking puts you and people around you at risk for many serious long-lasting (chronic) diseases. Quitting smoking is hard, but it is one of the best things that you can do for your health. It is never too late to quit. What are the benefits of quitting smoking? When you quit smoking, you lower your risk for getting serious diseases and conditions. They can include:  Lung cancer or lung disease.  Heart disease.  Stroke.  Heart attack.  Not being able to have children (infertility).  Weak bones (osteoporosis) and broken bones (fractures).  If you have coughing, wheezing, and shortness of breath, those symptoms may get better when you quit. You may also get sick less often. If you are pregnant, quitting smoking can help to lower your chances of having a baby of low birth weight. What can I do to help me quit smoking? Talk with your doctor about what can help you quit smoking. Some things you can do (strategies) include:  Quitting smoking totally, instead of slowly cutting back how much you smoke over a period of time.  Going to in-person counseling. You are more likely to quit if you go to many counseling sessions.  Using resources and support systems, such as: ? Online chats with a counselor. ? Phone quitlines. ? Printed self-help materials. ? Support groups or group counseling. ? Text messaging programs. ? Mobile phone apps or applications.  Taking medicines. Some of these medicines may have nicotine in them. If you are pregnant or breastfeeding, do not take any medicines to quit smoking unless your doctor says it is okay. Talk with your doctor about counseling or other things that can help you.  Talk with your doctor about using more than one strategy at the same time, such as taking medicines while you are also going to in-person counseling. This can help make  quitting easier. What things can I do to make it easier to quit? Quitting smoking might feel very hard at first, but there is a lot that you can do to make it easier. Take these steps:  Talk to your family and friends. Ask them to support and encourage you.  Call phone quitlines, reach out to support groups, or work with a counselor.  Ask people who smoke to not smoke around you.  Avoid places that make you want (trigger) to smoke, such as: ? Bars. ? Parties. ? Smoke-break areas at work.  Spend time with people who do not smoke.  Lower the stress in your life. Stress can make you want to smoke. Try these things to help your stress: ? Getting regular exercise. ? Deep-breathing exercises. ? Yoga. ? Meditating. ? Doing a body scan. To do this, close your eyes, focus on one area of your body at a time from head to toe, and notice which parts of your body are tense. Try to relax the muscles in those areas.  Download or buy apps on your mobile phone or tablet that can help you stick to your quit plan. There are many free apps, such as QuitGuide from the CDC (Centers for Disease Control and Prevention). You can find more support from smokefree.gov and other websites.  This information is not intended to replace advice given to you by your health care provider. Make sure you discuss any questions you have with your health care provider. Document Released: 06/12/2009 Document   Revised: 04/13/2016 Document Reviewed: 12/31/2014 Elsevier Interactive Patient Education  2018 Elsevier Inc.  

## 2017-05-17 NOTE — Telephone Encounter (Signed)
He needs appointment, repeat x-rays if that much pain. I had told him possibility of needing to see hand surgeon.

## 2017-05-26 ENCOUNTER — Encounter: Payer: Self-pay | Admitting: Orthopaedic Surgery

## 2017-05-26 ENCOUNTER — Telehealth: Payer: Self-pay | Admitting: Orthopedic Surgery

## 2017-05-26 ENCOUNTER — Telehealth: Payer: Self-pay | Admitting: Podiatry

## 2017-05-26 NOTE — Telephone Encounter (Signed)
I was calling back to see if you had heard from the doctor yet? I told the pt we haven't and that he might still be in surgery. I said once we hear back from Dr. Logan Bores we would be in touch. Pt stated he is currently using the number [phone] and asked what number he could call back before 5:00 pm today because the connection is in and out on his phone. I gave him [phone] as the number to call back.

## 2017-05-26 NOTE — Telephone Encounter (Signed)
Pt of Dr. Hilda Lias requests refill on Hydrocodone/Acetaminophen 5-325  Mgs.   Qty  28       Sig: One tablet by mouth every six hours as needed for pain. Seven day limit.    Patient is scheduled at [facility] this coming Monday per Dr. Sanjuan Dame referral. He does not have enough pain medication to last until Monday.

## 2017-05-26 NOTE — Telephone Encounter (Signed)
Dr Logan Bores, what do you want to prescribed for pain management

## 2017-05-26 NOTE — Telephone Encounter (Signed)
I am scheduled for surgery on 11 October but I was calling to see if I can get some pain medication. I'm in severe pain and I don't want to keep going to the emergency room as I have gone so much and they keep telling me the same thing. I told the pt I would get the message to the nurse and she would contact Dr. Logan Bores about his request. Pt wanted to know if he would get a call back today. I told the pt it depended on when the nurse heard back from Dr. Logan Bores. I explained that today is his surgery today and if not today it would probably be tomorrow when he's back in the clinic. I told the pt to have a good afternoon and pt hung up.

## 2017-05-27 ENCOUNTER — Other Ambulatory Visit: Payer: Self-pay | Admitting: Orthopedic Surgery

## 2017-05-27 MED ORDER — HYDROCODONE-ACETAMINOPHEN 5-325 MG PO TABS: ORAL_TABLET | ORAL | 0 refills | Status: DC

## 2017-05-27 NOTE — Telephone Encounter (Signed)
Shanda Bumps, please advise. Thank you.

## 2017-05-27 NOTE — Telephone Encounter (Signed)
Due to history of opiod abuse I cannot prescribe pain meds until after surgery. At that time I will prescribe one week of opiod medication and taper from there. Thanks, Dr. Logan Bores

## 2017-05-30 ENCOUNTER — Ambulatory Visit (INDEPENDENT_AMBULATORY_CARE_PROVIDER_SITE_OTHER): Payer: Medicaid Other | Admitting: Physician Assistant

## 2017-05-30 ENCOUNTER — Encounter (INDEPENDENT_AMBULATORY_CARE_PROVIDER_SITE_OTHER): Payer: Self-pay | Admitting: Physician Assistant

## 2017-05-30 VITALS — Ht 71.0 in | Wt 205.0 lb

## 2017-05-30 DIAGNOSIS — M79641 Pain in right hand: Secondary | ICD-10-CM
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
## 2017-05-30 NOTE — Addendum Note (Signed)
Addended by: Rogers Seeds on: 05/30/2017 03:10 PM   Modules accepted: Orders

## 2017-05-30 NOTE — Telephone Encounter (Signed)
I was calling back to see if the nurse has heard anything from the doctor about pain medication. Please call me back at [phone].

## 2017-05-30 NOTE — Telephone Encounter (Addendum)
I reviewed Dr. Logan Bores 05/27/2017 reply to pt request for pain medications. I spoke with male answering the phone number pt had given and she stated pt was not there at this time. Pt call surgery coordinator - D. Meadows extension and she transferred to me. Pt states he doesn't know how his situation got all over the office, and I told pt he left a message 05/26/2017 for the current nurse taking care of triage and the a message this morning for the triage nurse - me, then he had called on the surgery coordinators line. I told pt that we did not prescribe pain medications prior to surgery, but Dr. Logan Bores would take care of his prescriptions after surgery

## 2017-06-02 ENCOUNTER — Ambulatory Visit: Payer: Self-pay | Admitting: Orthopaedic Surgery

## 2017-06-03 ENCOUNTER — Ambulatory Visit (HOSPITAL_COMMUNITY): Payer: Medicaid Other | Attending: Physician Assistant | Admitting: Occupational Therapy

## 2017-06-03 ENCOUNTER — Encounter (HOSPITAL_COMMUNITY): Payer: Self-pay | Admitting: Occupational Therapy

## 2017-06-03 DIAGNOSIS — M79644 Pain in right finger(s): Secondary | ICD-10-CM | POA: Diagnosis not present

## 2017-06-03 DIAGNOSIS — R29898 Other symptoms and signs involving the musculoskeletal system: Secondary | ICD-10-CM | POA: Insufficient documentation
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
## 2017-06-03 NOTE — Patient Instructions (Signed)
Complete all exercises 10x each, 2-3x/day  1) Towel crunch Place a small towel on a firm table top. Flatten out the towel and then place your hand on one end of it.  Next, flex your fingers 2-5 (index finger through pinky finger) as you pull the towel towards your hand.    2) Digit composite flexion/adduction (make a fist) Hold your hand up as shown. Open and close your hand into a fist and repeat. If you cannot make a full fist, then make a partial fist.     3) Finger Taps Start with the hand flat and fingers slightly spread.  One at a time, starting with the thumb, lift each finger up separately.     4) Digit Abduction/Adduction Hold hand palm down flat on table. Spread your fingers apart and back together.    Home Exercises Program Theraputty Exercises  Do the following exercises 2 times a day using your affected hand.  1. Roll putty into a ball.  2. Make into a pancake.  3. Roll putty into a roll.  4. Pinch along log with first finger and thumb.   5. Make into a ball.  6. Roll it back into a log.   7. Pinch using thumb and side of first finger.  8. Roll into a ball, then flatten into a pancake.  9. Using your fingers, make putty into a mountain.

## 2017-06-06 ENCOUNTER — Emergency Department: Payer: Medicaid Other

## 2017-06-06 ENCOUNTER — Encounter: Payer: Self-pay | Admitting: Radiology

## 2017-06-06 ENCOUNTER — Emergency Department
Admission: EM | Admit: 2017-06-06 | Discharge: 2017-06-06 | Disposition: A | Discharge status: Home / Self Care | Payer: Medicaid Other | Attending: Emergency Medicine | Admitting: Emergency Medicine

## 2017-06-06 DIAGNOSIS — J45909 Unspecified asthma, uncomplicated: Secondary | ICD-10-CM | POA: Diagnosis not present

## 2017-06-06 DIAGNOSIS — Z79899 Other long term (current) drug therapy: Secondary | ICD-10-CM | POA: Diagnosis not present

## 2017-06-06 DIAGNOSIS — F17210 Nicotine dependence, cigarettes, uncomplicated: Secondary | ICD-10-CM | POA: Diagnosis not present

## 2017-06-06 DIAGNOSIS — G8929 Other chronic pain: Secondary | ICD-10-CM | POA: Insufficient documentation

## 2017-06-06 DIAGNOSIS — R079 Chest pain, unspecified: Secondary | ICD-10-CM | POA: Diagnosis present

## 2017-06-06 LAB — CBC
HEMATOCRIT: 41.9 % (ref 40.0–52.0)
Hemoglobin: 14 g/dL (ref 13.0–18.0)
MCH: 26.1 pg (ref 26.0–34.0)
MCHC: 33.5 g/dL (ref 32.0–36.0)
MCV: 77.9 fL — AB (ref 80.0–100.0)
PLATELETS: 136 10*3/uL — AB (ref 150–440)
RBC: 5.38 MIL/uL (ref 4.40–5.90)
RDW: 13.9 % (ref 11.5–14.5)
WBC: 5.6 10*3/uL (ref 3.8–10.6)

## 2017-06-06 LAB — TROPONIN I
Troponin I: <0.03 ng/mL (ref <0.03)
Troponin I: <0.03 ng/mL (ref <0.03)

## 2017-06-06 LAB — COMPREHENSIVE METABOLIC PANEL
ALK PHOS: 45 U/L (ref 38–126)
ALT: 13 U/L — AB (ref 17–63)
AST: 15 U/L (ref 15–41)
Albumin: 4.3 g/dL (ref 3.5–5.0)
Anion gap: 6 (ref 5–15)
BUN: 10 mg/dL (ref 6–20)
CALCIUM: 8.9 mg/dL (ref 8.9–10.3)
CHLORIDE: 106 mmol/L (ref 101–111)
CO2: 26 mmol/L (ref 22–32)
CREATININE: 0.84 mg/dL (ref 0.61–1.24)
Glucose, Bld: 136 mg/dL — ABNORMAL HIGH (ref 65–99)
Potassium: 2.9 mmol/L — ABNORMAL LOW (ref 3.5–5.1)
Sodium: 138 mmol/L (ref 135–145)
Total Bilirubin: 0.9 mg/dL (ref 0.3–1.2)
Total Protein: 7.7 g/dL (ref 6.5–8.1)

## 2017-06-06 LAB — CBC WITH DIFFERENTIAL/PLATELET
Basophils Absolute: 0 10*3/uL (ref 0–0.1)
Basophils Relative: 0 %
EOS ABS: 0.1 10*3/uL (ref 0–0.7)
Eosinophils Relative: 1 %
HEMATOCRIT: 42.9 % (ref 40.0–52.0)
HEMOGLOBIN: 14.4 g/dL (ref 13.0–18.0)
LYMPHS ABS: 1.9 10*3/uL (ref 1.0–3.6)
LYMPHS PCT: 19 %
MCH: 26.2 pg (ref 26.0–34.0)
MCHC: 33.6 g/dL (ref 32.0–36.0)
MCV: 78 fL — AB (ref 80.0–100.0)
MONOS PCT: 8 %
Monocytes Absolute: 0.8 10*3/uL (ref 0.2–1.0)
NEUTROS ABS: 7.2 10*3/uL — AB (ref 1.4–6.5)
Neutrophils Relative %: 72 %
Platelets: 140 10*3/uL — ABNORMAL LOW (ref 150–440)
RBC: 5.51 MIL/uL (ref 4.40–5.90)
RDW: 13.7 % (ref 11.5–14.5)
WBC: 10 10*3/uL (ref 3.8–10.6)

## 2017-06-06 LAB — BASIC METABOLIC PANEL
Anion gap: 7 (ref 5–15)
BUN: 17 mg/dL (ref 6–20)
CHLORIDE: 106 mmol/L (ref 101–111)
CO2: 25 mmol/L (ref 22–32)
CREATININE: 0.86 mg/dL (ref 0.61–1.24)
Calcium: 9 mg/dL (ref 8.9–10.3)
GFR calc Af Amer: >60 mL/min (ref >60)
GFR calc non Af Amer: >60 mL/min (ref >60)
GLUCOSE: 100 mg/dL — AB (ref 65–99)
POTASSIUM: 3.9 mmol/L (ref 3.5–5.1)
Sodium: 138 mmol/L (ref 135–145)

## 2017-06-06 MED ORDER — IOPAMIDOL (ISOVUE-370) INJECTION 76%
75.0000 mL | Freq: Once | INTRAVENOUS | Status: AC | PRN
  Administered 2017-06-06: 75 mL via INTRAVENOUS
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
## 2017-06-06 NOTE — ED Provider Notes (Signed)
-----------------------------------------   10:23 AM on 06/06/2017 -----------------------------------------  Patient care is him from Dr. Juliette Alcide. Patient's workup has been largely negative including negative troponin. CT angiography of the chest is negative. Patient will be discharged home with PCP follow-up.   Minna Antis, MD 06/06/17 1024

## 2017-06-06 NOTE — ED Triage Notes (Signed)
EMS pt to room 15 from [facility]. Pt reports centralized cp for 3 days after eating a chicken biscuit. Pt also reports right hand pain and right foot pain for 3 to 4 weeks. Having therapy for those pains and scheduled for foot surgery. Pt falling asleep and has to be awaken several times during the triage process.

## 2017-06-06 NOTE — Discharge Instructions (Signed)
You have been seen in the emergency department today for chest pain. Your workup has shown normal results. As we discussed please follow-up with your primary care physician in the next 1-2 days for recheck. Return to the emergency department for any further chest pain, trouble breathing, or any other symptom personally concerning to yourself. °
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
## 2017-06-07 ENCOUNTER — Emergency Department
Admission: EM | Admit: 2017-06-07 | Discharge: 2017-06-07 | Disposition: A | Discharge status: Home / Self Care | Payer: Medicaid Other | Source: Home / Self Care | Attending: Emergency Medicine | Admitting: Emergency Medicine

## 2017-06-07 DIAGNOSIS — Z765 Malingerer [conscious simulation]: Secondary | ICD-10-CM

## 2017-06-07 DIAGNOSIS — R0789 Other chest pain: Secondary | ICD-10-CM

## 2017-06-07 DIAGNOSIS — J069 Acute upper respiratory infection, unspecified: Secondary | ICD-10-CM

## 2017-06-07 NOTE — Discharge Instructions (Signed)
Please make an appointment to follow up with primary care physician as needed and return to the emergency department for any concerns.  It was a pleasure to take care of you today, and thank you for coming to our emergency department.  If you have any questions or concerns before leaving please ask the nurse to grab me and I'm more than happy to go through your aftercare instructions again.  If you were prescribed any opioid pain medication today such as Norco, Vicodin, Percocet, morphine, hydrocodone, or oxycodone please make sure you do not drive when you are taking this medication as it can alter your ability to drive safely.  If you have any concerns once you are home that you are not improving or are in fact getting worse before you can make it to your follow-up appointment, please do not hesitate to call 911 and come back for further evaluation.  Merrily Brittle, MD  Results for orders placed or performed during the hospital encounter of 06/07/17  Basic metabolic panel  Result Value Ref Range   Sodium 138 135 - 145 mmol/L   Potassium 3.9 3.5 - 5.1 mmol/L   Chloride 106 101 - 111 mmol/L   CO2 25 22 - 32 mmol/L   Glucose, Bld 100 (H) 65 - 99 mg/dL   BUN 17 6 - 20 mg/dL   Creatinine, Ser 1.61 0.61 - 1.24 mg/dL   Calcium 9.0 8.9 - 09.6 mg/dL   GFR calc non Af Amer >60 >60 mL/min   GFR calc Af Amer >60 >60 mL/min   Anion gap 7 5 - 15  CBC  Result Value Ref Range   WBC 5.6 3.8 - 10.6 K/uL   RBC 5.38 4.40 - 5.90 MIL/uL   Hemoglobin 14.0 13.0 - 18.0 g/dL   HCT 04.5 40.9 - 81.1 %   MCV 77.9 (L) 80.0 - 100.0 fL   MCH 26.1 26.0 - 34.0 pg   MCHC 33.5 32.0 - 36.0 g/dL   RDW 91.4 78.2 - 95.6 %   Platelets 136 (L) 150 - 440 K/uL  Troponin I  Result Value Ref Range   Troponin I <0.03 <0.03 ng/mL   Dg Chest 2 View  Result Date: 06/06/2017 CLINICAL DATA:  Two week history of right-sided chest pain. EXAM: CHEST  2 VIEW COMPARISON:  06/06/2017 FINDINGS: The lungs are clear. The pulmonary  vasculature is normal. Heart size is normal. Hilar and mediastinal contours are unremarkable. There is no pleural effusion. IMPRESSION: No active cardiopulmonary disease. Electronically Signed   By: Ellery Plunk M.D.   On: 06/06/2017 23:48   Ct Angio Chest Pe W And/or Wo Contrast  Result Date: 06/06/2017 CLINICAL DATA:  Chest pain for 3 days. EXAM: CT ANGIOGRAPHY CHEST WITH CONTRAST TECHNIQUE: Multidetector CT imaging of the chest was performed using the standard protocol during bolus administration of intravenous contrast. Multiplanar CT image reconstructions and MIPs were obtained to evaluate the vascular anatomy. CONTRAST:  75 mL Isovue 370 COMPARISON:  Chest radiograph 06/06/2017 FINDINGS: Cardiovascular: Pulmonary arterial opacification is adequate without evidence of emboli. There is no evidence of thoracic aortic aneurysm or dissection. The heart is normal in size. There is no pericardial effusion. Mediastinum/Nodes: Borderline enlarged lymph nodes measure up to 10 mm in short axis in the AP window and right hilum. The esophagus is mostly decompressed and grossly unremarkable. Lungs/Pleura: No pleural effusion or pneumothorax. Mild dependent atelectasis in the right greater than left lower lobes and right upper lobe. No mass. Upper Abdomen:  Unremarkable. Musculoskeletal: No acute osseous abnormality or suspicious osseous lesion. Review of the MIP images confirms the above findings. IMPRESSION: No evidence of pulmonary emboli or other acute abnormality in the chest. Electronically Signed   By: Sebastian Ache M.D.   On: 06/06/2017 09:38   Dg Chest Portable 1 View  Result Date: 06/06/2017 CLINICAL DATA:  Acute onset of central chest pain. Initial encounter. EXAM: PORTABLE CHEST 1 VIEW COMPARISON:  None. FINDINGS: The lungs are well-aerated and clear. There is no evidence of focal opacification, pleural effusion or pneumothorax. The cardiomediastinal silhouette is within normal limits. No acute osseous  abnormalities are seen. IMPRESSION: No acute cardiopulmonary process seen. Electronically Signed   By: Roanna Raider M.D.   On: 06/06/2017 06:25   Dg Hand Complete Right  Result Date: 05/17/2017 Clinical:  Hand injury right X-rays were done of the right hand, three views. There is a small displaced avulsion fracture of the base ulnar side of the proximal phalanx of the index finger.  There is a nondisplaced fracture of the base of the right ring finger proximal phalanx.  No other fracture is noted.  Bone quality is good. Impression:  Fracture (avulsion type) of the base of the proximal phalanx of the right index and ring fingers ulnar side, index finger with some displacement. Electronically Signed Darreld Mclean, MD 9/18/20183:20 PM
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
## 2017-06-07 NOTE — ED Notes (Signed)
Pt refuses to be discharged. Charge RN made aware. Consulting civil engineer and security at bedside.

## 2017-06-07 NOTE — ED Notes (Signed)
ED Provider at bedside. 

## 2017-06-08 ENCOUNTER — Telehealth: Payer: Self-pay | Admitting: Podiatry

## 2017-06-08 NOTE — Telephone Encounter (Signed)
Pt called wanting to know if we had a print out of his ID that we received and/or could we send one to the surgical center. Pt stated he cannot find his picture I.D. I told the pt I would take a message and the surgical coordinator would call him back. After hanging up, the pt called back requesting the number to the surgical center. I told him that number is [phone].

## 2017-06-09 ENCOUNTER — Encounter (HOSPITAL_COMMUNITY): Payer: Self-pay | Admitting: Emergency Medicine

## 2017-06-09 ENCOUNTER — Telehealth: Payer: Self-pay | Admitting: *Deleted

## 2017-06-09 ENCOUNTER — Encounter: Payer: Self-pay | Admitting: Podiatry

## 2017-06-09 ENCOUNTER — Emergency Department (HOSPITAL_COMMUNITY)
Admission: EM | Admit: 2017-06-09 | Discharge: 2017-06-09 | Disposition: A | Discharge status: Home / Self Care | Payer: Medicaid Other | Attending: Emergency Medicine | Admitting: Emergency Medicine

## 2017-06-09 DIAGNOSIS — M79641 Pain in right hand: Secondary | ICD-10-CM | POA: Insufficient documentation

## 2017-06-09 DIAGNOSIS — J45909 Unspecified asthma, uncomplicated: Secondary | ICD-10-CM | POA: Diagnosis not present

## 2017-06-09 DIAGNOSIS — F17210 Nicotine dependence, cigarettes, uncomplicated: Secondary | ICD-10-CM | POA: Insufficient documentation

## 2017-06-09 DIAGNOSIS — Z79899 Other long term (current) drug therapy: Secondary | ICD-10-CM | POA: Diagnosis not present

## 2017-06-09 DIAGNOSIS — M79674 Pain in right toe(s): Secondary | ICD-10-CM | POA: Diagnosis present

## 2017-06-09 MED ORDER — HYDROCODONE-ACETAMINOPHEN 5-325 MG PO TABS
1.0000 | ORAL_TABLET | Freq: Once | ORAL | Status: AC
  Administered 2017-06-09: 1 via ORAL
  Filled 2017-06-09: qty 1

## 2017-06-09 MED ORDER — HYDROCODONE-ACETAMINOPHEN 5-325 MG PO TABS: 1.0000 | ORAL_TABLET | Freq: Four times a day (QID) | ORAL | 0 refills | Status: DC | PRN
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
## 2017-06-09 NOTE — ED Triage Notes (Addendum)
Per EMS-states left great toe pain for 3 months-states he was suppose to have surgery today but was "turned away from the surgical center" was seen for same symptoms at [facility] on the 8th and 9th-wants pain management until her can get surgery done-patient was picked up at grocery store-patient ate his pizza on the way here

## 2017-06-09 NOTE — Discharge Instructions (Signed)
I have given you a resource guide for shelters in the area.   Please schedule an appointment with your primary doctor for chronic management of your right toe pain. Your blood pressure was elevated in the ER today, please have this rechecked at your primary doctor when you have your upcoming appointment.

## 2017-06-09 NOTE — Telephone Encounter (Signed)
"  I need to reschedule my surgery for later this week or next week.  I went today but I didn't have anyone with me so I have to reschedule."  He can do it on October 25.  "What day is that and what time will I need to be there?"  That is a Thursday.  Someone from the surgical center will call with the arrival time a day or two prior to surgery date.
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
## 2017-06-10 ENCOUNTER — Emergency Department (HOSPITAL_COMMUNITY)
Admission: EM | Admit: 2017-06-10 | Discharge: 2017-06-10 | Disposition: A | Discharge status: Home / Self Care | Payer: Medicaid Other | Source: Home / Self Care | Attending: Emergency Medicine | Admitting: Emergency Medicine

## 2017-06-10 ENCOUNTER — Telehealth: Payer: Self-pay | Admitting: Podiatry

## 2017-06-10 DIAGNOSIS — M79671 Pain in right foot: Secondary | ICD-10-CM

## 2017-06-10 DIAGNOSIS — M79641 Pain in right hand: Secondary | ICD-10-CM

## 2017-06-10 HISTORY — DX: Malingerer (conscious simulation): Z76.5

## 2017-06-10 NOTE — Telephone Encounter (Signed)
I was calling to see if you could talk to Dr. Logan Bores about me getting some medication. My number is [phone]. Also, I want to see if I can get my surgery earlier. I transferred pt to [facility] our surgical coordinator for her to help him with that.
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
## 2017-06-10 NOTE — Telephone Encounter (Addendum)
Unable to leave a message the mailbox is full and is not able to accept any messages. Pt called for pain medication. I informed him that Dr.Evans did not prescribe narcotic pain medication prior to surgery. Pt states understanding.

## 2017-06-10 NOTE — Discharge Instructions (Signed)
1. Medications: usual home medications °2. Treatment: rest, drink plenty of fluids,  °3. Follow Up: Please followup with your primary doctor for discussion of your diagnoses and further evaluation after today's visit; ° ° ° °

## 2017-06-10 NOTE — Telephone Encounter (Signed)
"  I was calling to see if I can make an earlier surgery date."  Joya San informed him that Dr. Logan Bores does not have anything earlier than October 25.

## 2017-06-13 ENCOUNTER — Encounter (HOSPITAL_COMMUNITY): Payer: Self-pay | Admitting: Emergency Medicine

## 2017-06-13 DIAGNOSIS — F17210 Nicotine dependence, cigarettes, uncomplicated: Secondary | ICD-10-CM | POA: Insufficient documentation

## 2017-06-13 DIAGNOSIS — G8929 Other chronic pain: Secondary | ICD-10-CM | POA: Diagnosis not present

## 2017-06-13 DIAGNOSIS — R05 Cough: Secondary | ICD-10-CM | POA: Insufficient documentation

## 2017-06-13 DIAGNOSIS — M79641 Pain in right hand: Secondary | ICD-10-CM | POA: Diagnosis present

## 2017-06-13 DIAGNOSIS — Z79899 Other long term (current) drug therapy: Secondary | ICD-10-CM | POA: Insufficient documentation

## 2017-06-13 DIAGNOSIS — J45909 Unspecified asthma, uncomplicated: Secondary | ICD-10-CM | POA: Insufficient documentation

## 2017-06-13 DIAGNOSIS — M79674 Pain in right toe(s): Secondary | ICD-10-CM | POA: Insufficient documentation

## 2017-06-13 NOTE — ED Triage Notes (Signed)
Pt states he has a fracture in his hand and a broken toe and is having pain in both  Fractures are from August in his hand  Pt states he needs surgery on his toe  Pt states he has a productive cough with yellow sputum that he has had for about a week  Pt states his chest is sore from coughing

## 2017-06-14 ENCOUNTER — Emergency Department (HOSPITAL_COMMUNITY)
Admission: EM | Admit: 2017-06-14 | Discharge: 2017-06-14 | Disposition: A | Discharge status: Home / Self Care | Payer: Medicaid Other | Attending: Emergency Medicine | Admitting: Emergency Medicine

## 2017-06-14 ENCOUNTER — Emergency Department (HOSPITAL_COMMUNITY): Payer: Medicaid Other

## 2017-06-14 DIAGNOSIS — R059 Cough, unspecified: Secondary | ICD-10-CM

## 2017-06-14 DIAGNOSIS — M79674 Pain in right toe(s): Secondary | ICD-10-CM

## 2017-06-14 DIAGNOSIS — R05 Cough: Secondary | ICD-10-CM

## 2017-06-14 DIAGNOSIS — G8929 Other chronic pain: Secondary | ICD-10-CM

## 2017-06-14 DIAGNOSIS — M79641 Pain in right hand: Secondary | ICD-10-CM

## 2017-06-14 MED ORDER — BENZONATATE 100 MG PO CAPS
100.0000 mg | ORAL_CAPSULE | Freq: Once | ORAL | Status: AC
  Administered 2017-06-14: 100 mg via ORAL
  Filled 2017-06-14: qty 1

## 2017-06-14 MED ORDER — IPRATROPIUM-ALBUTEROL 0.5-2.5 (3) MG/3ML IN SOLN
3.0000 mL | Freq: Once | RESPIRATORY_TRACT | Status: AC
  Administered 2017-06-14: 3 mL via RESPIRATORY_TRACT
  Filled 2017-06-14: qty 3

## 2017-06-14 MED ORDER — ALBUTEROL SULFATE HFA 108 (90 BASE) MCG/ACT IN AERS
1.0000 | INHALATION_SPRAY | Freq: Once | RESPIRATORY_TRACT | Status: AC
Start: 2017-06-14 — End: 2017-06-14
  Administered 2017-06-14: 1 via RESPIRATORY_TRACT
  Filled 2017-06-14: qty 6.7

## 2017-06-14 MED ORDER — PREDNISONE 20 MG PO TABS: 40.0000 mg | ORAL_TABLET | Freq: Every day | ORAL | 0 refills | Status: DC

## 2017-06-14 MED ORDER — BENZONATATE 100 MG PO CAPS: 100.0000 mg | ORAL_CAPSULE | Freq: Three times a day (TID) | ORAL | 0 refills | Status: DC

## 2017-06-14 MED ORDER — PREDNISONE 20 MG PO TABS
40.0000 mg | ORAL_TABLET | Freq: Once | ORAL | Status: AC
Start: 2017-06-14 — End: 2017-06-14
  Administered 2017-06-14: 40 mg via ORAL
  Filled 2017-06-14: qty 2

## 2017-06-14 NOTE — Discharge Instructions (Signed)
ER workup and imaging has been reassuring. This is likely a viral bronchitis. Take the prednisone starting tomorrow morning as prescribed for 3 days. Take the Tessalon for cough. Use over-the-counter Mucinex for chest congestion. Use the inhaler for wheezing. May take Motrin and Tylenol for your toe and finger pain. It is important that she follow up with your primary doctor and your orthopedic doctor. If he develop any worsening symptoms including shortness of breath, coughing up blood, fevers return to the ED.

## 2017-06-14 NOTE — ED Notes (Signed)
Patient given a bus ticket, a sandwich, and a ginger ale.

## 2017-06-15 ENCOUNTER — Encounter: Payer: Medicaid Other | Admitting: Podiatry

## 2017-06-16 ENCOUNTER — Telehealth: Payer: Self-pay | Admitting: Orthopaedic Surgery

## 2017-06-16 NOTE — Telephone Encounter (Signed)
Patient stopped into office, arrived about 3 minutes ago; he is asking to speak w/Dr Hilda LiasKeeling or schedule appointment, for his hand problem, or for managing his pain.  Upon asking about having been referred to other providers in [place], he states he's been seeing the [facility] doctors for his foot(toe) however, said that the orthopaedic doctor for his hand was unable to do the surgery as scheduled on 06/09/17, as he arrived at hospital with no one that could stay or drive him home. Asking for Dr Hilda LiasKeeling to advise.

## 2017-06-16 NOTE — Telephone Encounter (Signed)
Per Dr Hilda LiasKeeling, not going to see patient. I relayed; patient voiced understanding, said thank you, and left.

## 2017-06-17 ENCOUNTER — Telehealth (HOSPITAL_COMMUNITY): Payer: Self-pay | Admitting: Family Medicine
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
## 2017-06-17 NOTE — Telephone Encounter (Signed)
06/17/17  Called to let him know that 3 visits were approved for him thru 06/26/17.  The lady that I spoke to said he just left and I couldn't leave a message

## 2017-06-21 ENCOUNTER — Ambulatory Visit (HOSPITAL_COMMUNITY): Payer: Medicaid Other | Admitting: Occupational Therapy

## 2017-06-21 ENCOUNTER — Telehealth (HOSPITAL_COMMUNITY): Payer: Self-pay | Admitting: Family Medicine

## 2017-06-21 NOTE — Telephone Encounter (Signed)
06/21/17  he left a messge to cx and does want to reschedule -- he said that he didn't notify transportation in time to bring him

## 2017-06-22 ENCOUNTER — Encounter: Payer: Medicaid Other | Admitting: Podiatry

## 2017-06-23 ENCOUNTER — Ambulatory Visit (HOSPITAL_COMMUNITY): Payer: Medicaid Other | Admitting: Occupational Therapy

## 2017-06-23 DIAGNOSIS — G5750 Tarsal tunnel syndrome, unspecified lower limb: Secondary | ICD-10-CM | POA: Diagnosis not present

## 2017-06-23 DIAGNOSIS — M2570 Osteophyte, unspecified joint: Secondary | ICD-10-CM | POA: Diagnosis not present

## 2017-06-28 ENCOUNTER — Telehealth: Payer: Self-pay | Admitting: *Deleted

## 2017-06-28 NOTE — Telephone Encounter (Signed)
Pt states he was suppose to be in tomorrow but transportation does not come on Wednesdays and he is rescheduled to 07/04/2017 and would like a refill of the pain medication.

## 2017-06-28 NOTE — Telephone Encounter (Signed)
Okay to refill. Rx Percocet 5/325mg  #21 q4h prn pain. Thanks, Dr. Logan BoresEvans

## 2017-06-29 ENCOUNTER — Telehealth: Payer: Self-pay | Admitting: Podiatry

## 2017-06-29 ENCOUNTER — Encounter: Payer: Medicaid Other | Admitting: Podiatry

## 2017-06-29 ENCOUNTER — Encounter (HOSPITAL_COMMUNITY): Payer: Self-pay | Admitting: Emergency Medicine

## 2017-06-29 ENCOUNTER — Emergency Department (HOSPITAL_COMMUNITY)
Admission: EM | Admit: 2017-06-29 | Discharge: 2017-06-29 | Disposition: A | Discharge status: Home / Self Care | Payer: Medicaid Other | Attending: Emergency Medicine | Admitting: Emergency Medicine

## 2017-06-29 DIAGNOSIS — Z5189 Encounter for other specified aftercare: Secondary | ICD-10-CM | POA: Diagnosis not present

## 2017-06-29 DIAGNOSIS — Y999 Unspecified external cause status: Secondary | ICD-10-CM | POA: Diagnosis not present

## 2017-06-29 DIAGNOSIS — Y939 Activity, unspecified: Secondary | ICD-10-CM | POA: Diagnosis not present

## 2017-06-29 DIAGNOSIS — J45909 Unspecified asthma, uncomplicated: Secondary | ICD-10-CM | POA: Diagnosis not present

## 2017-06-29 DIAGNOSIS — F17210 Nicotine dependence, cigarettes, uncomplicated: Secondary | ICD-10-CM | POA: Diagnosis not present

## 2017-06-29 DIAGNOSIS — X58XXXD Exposure to other specified factors, subsequent encounter: Secondary | ICD-10-CM | POA: Insufficient documentation

## 2017-06-29 DIAGNOSIS — Y929 Unspecified place or not applicable: Secondary | ICD-10-CM | POA: Insufficient documentation

## 2017-06-29 DIAGNOSIS — Z4802 Encounter for removal of sutures: Secondary | ICD-10-CM | POA: Insufficient documentation

## 2017-06-29 DIAGNOSIS — Z79899 Other long term (current) drug therapy: Secondary | ICD-10-CM | POA: Insufficient documentation

## 2017-06-29 DIAGNOSIS — S99929D Unspecified injury of unspecified foot, subsequent encounter: Secondary | ICD-10-CM | POA: Diagnosis present

## 2017-06-29 DIAGNOSIS — S91113D Laceration without foreign body of unspecified great toe without damage to nail, subsequent encounter: Secondary | ICD-10-CM | POA: Diagnosis not present

## 2017-06-29 MED ORDER — OXYCODONE HCL 5 MG PO TABS: 5.0000 mg | ORAL_TABLET | ORAL | 0 refills | Status: DC | PRN

## 2017-06-29 NOTE — Telephone Encounter (Signed)
Yes I was calling to see if you could fax the [facility] to [facility]. You can get in touch with me at [phone].

## 2017-06-29 NOTE — Telephone Encounter (Signed)
I informed pt's grandmother, Juliane Lackrla Wagoner, pt would have to pick up the rx in the [place] office it could not be faxed to the pharmacy.

## 2017-06-29 NOTE — Discharge Instructions (Signed)
Make sure to follow up with your surgeon in office for staple removal and post-op evaluation.  The wound appears to be well-healing without evidence of infection or swelling at this time.

## 2017-06-29 NOTE — ED Triage Notes (Signed)
Pt states he is coming into the ED for his staples to be removed from his left great toe. Pt states he has some welling in the left great toe since the staples were placed. Pt states he has placed Ice packs on the toe and it has not decreased the swelling. Pt states he is having pain in his left foot, rated a 7 on a scale of 0 to 10.

## 2017-06-29 NOTE — ED Notes (Signed)
Pt got up from stretcher and informed registration clerk that he was leaving since we are not taking the staples out tonight.

## 2017-06-29 NOTE — ED Notes (Signed)
Pt left prior to receiving discharge instructions.
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
## 2017-06-29 NOTE — Telephone Encounter (Signed)
I informed pt the prescription for oxycodone could be pick up in the [place] office.

## 2017-06-30 ENCOUNTER — Ambulatory Visit (HOSPITAL_COMMUNITY): Payer: Medicaid Other | Admitting: Specialist

## 2017-07-04 ENCOUNTER — Encounter: Payer: Self-pay | Admitting: Podiatry

## 2017-07-04 ENCOUNTER — Ambulatory Visit (INDEPENDENT_AMBULATORY_CARE_PROVIDER_SITE_OTHER): Payer: Medicaid Other

## 2017-07-04 ENCOUNTER — Ambulatory Visit (INDEPENDENT_AMBULATORY_CARE_PROVIDER_SITE_OTHER): Payer: Medicaid Other | Admitting: Podiatry

## 2017-07-04 VITALS — BP 139/83 | HR 61 | Temp 96.0°F

## 2017-07-04 DIAGNOSIS — M2570 Osteophyte, unspecified joint: Secondary | ICD-10-CM | POA: Diagnosis not present

## 2017-07-04 DIAGNOSIS — Z9889 Other specified postprocedural states: Secondary | ICD-10-CM

## 2017-07-04 DIAGNOSIS — M898X7 Other specified disorders of bone, ankle and foot: Secondary | ICD-10-CM

## 2017-07-05 ENCOUNTER — Emergency Department (HOSPITAL_COMMUNITY): Payer: Medicaid Other

## 2017-07-05 ENCOUNTER — Emergency Department (HOSPITAL_COMMUNITY)
Admission: EM | Admit: 2017-07-05 | Discharge: 2017-07-05 | Disposition: A | Discharge status: Home / Self Care | Payer: Medicaid Other | Attending: Emergency Medicine | Admitting: Emergency Medicine

## 2017-07-05 DIAGNOSIS — Z5321 Procedure and treatment not carried out due to patient leaving prior to being seen by health care provider: Secondary | ICD-10-CM | POA: Insufficient documentation

## 2017-07-05 DIAGNOSIS — T1490XA Injury, unspecified, initial encounter: Secondary | ICD-10-CM

## 2017-07-05 DIAGNOSIS — R2231 Localized swelling, mass and lump, right upper limb: Secondary | ICD-10-CM | POA: Insufficient documentation

## 2017-07-05 NOTE — ED Triage Notes (Signed)
Pt fell down some stairs injuring 5th metacarpal on Rt hand. Pt has obvious swelling to that digit as well as part of hand.

## 2017-07-05 NOTE — ED Provider Notes (Signed)
Pt was transported to the ED by EMS and left within 10 minutes.   Cole [name], Cole Barbar, MD 07/05/17 [phone]

## 2017-07-06 ENCOUNTER — Encounter: Payer: Medicaid Other | Admitting: Podiatry
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
## 2017-07-08 ENCOUNTER — Telehealth: Payer: Self-pay | Admitting: Podiatry

## 2017-07-08 ENCOUNTER — Ambulatory Visit (HOSPITAL_COMMUNITY): Payer: Medicaid Other | Attending: Physician Assistant | Admitting: Occupational Therapy

## 2017-07-08 NOTE — Telephone Encounter (Addendum)
I informed pt of Dr. Logan BoresEvans denying Oxycodone, and instructions to keep his appt on 07/18/2017 and that Dr. Logan BoresEvans had prescribed Motrin 800mg  #60 one tablet every 8 hours +1refill. Pt states understanding. I reviewed pt's allergies and he is allergic to Motrin, and I informed Dr.Evans, so he could change.

## 2017-07-08 NOTE — Telephone Encounter (Signed)
I was calling to see if Dr. Logan BoresEvans would give me one last refill of my medication. If you would give me a call back at [phone]. Thank you.

## 2017-07-08 NOTE — Telephone Encounter (Signed)
No refills. He needs to come into the office next week to re-evaluate the toe. No additional pain meds. He can have Motrin 800 #60 1 refill Rx if he wants it for pain and swelling.  Thanks, Dr. Logan BoresEvans

## 2017-07-08 NOTE — Telephone Encounter (Signed)
I was calling to see if Dr. Logan BoresEvans would refill my pain medication one more time. My toe is still swollen and painful. You can reach me at [phone]. Thank you.

## 2017-07-09 ENCOUNTER — Encounter (HOSPITAL_COMMUNITY): Payer: Self-pay | Admitting: Emergency Medicine

## 2017-07-09 ENCOUNTER — Emergency Department (HOSPITAL_COMMUNITY): Payer: Medicaid Other

## 2017-07-09 ENCOUNTER — Other Ambulatory Visit: Payer: Self-pay

## 2017-07-09 ENCOUNTER — Emergency Department (HOSPITAL_COMMUNITY)
Admission: EM | Admit: 2017-07-09 | Discharge: 2017-07-09 | Disposition: A | Discharge status: Home / Self Care | Payer: Medicaid Other | Attending: Emergency Medicine | Admitting: Emergency Medicine

## 2017-07-09 DIAGNOSIS — Z79899 Other long term (current) drug therapy: Secondary | ICD-10-CM | POA: Insufficient documentation

## 2017-07-09 DIAGNOSIS — W228XXA Striking against or struck by other objects, initial encounter: Secondary | ICD-10-CM | POA: Insufficient documentation

## 2017-07-09 DIAGNOSIS — J45909 Unspecified asthma, uncomplicated: Secondary | ICD-10-CM | POA: Insufficient documentation

## 2017-07-09 DIAGNOSIS — Y999 Unspecified external cause status: Secondary | ICD-10-CM | POA: Insufficient documentation

## 2017-07-09 DIAGNOSIS — S62626A Displaced fracture of medial phalanx of right little finger, initial encounter for closed fracture: Secondary | ICD-10-CM | POA: Diagnosis not present

## 2017-07-09 DIAGNOSIS — F17210 Nicotine dependence, cigarettes, uncomplicated: Secondary | ICD-10-CM | POA: Insufficient documentation

## 2017-07-09 DIAGNOSIS — Y939 Activity, unspecified: Secondary | ICD-10-CM | POA: Diagnosis not present

## 2017-07-09 DIAGNOSIS — Y929 Unspecified place or not applicable: Secondary | ICD-10-CM | POA: Insufficient documentation

## 2017-07-09 DIAGNOSIS — S67196A Crushing injury of right little finger, initial encounter: Secondary | ICD-10-CM | POA: Diagnosis present

## 2017-07-09 MED ORDER — DIPHENHYDRAMINE HCL 12.5 MG/5ML PO ELIX
12.5000 mg | ORAL_SOLUTION | Freq: Once | ORAL | Status: AC
  Administered 2017-07-09: 12.5 mg via ORAL
  Filled 2017-07-09: qty 5

## 2017-07-09 MED ORDER — MORPHINE SULFATE (PF) 4 MG/ML IV SOLN
4.0000 mg | Freq: Once | INTRAVENOUS | Status: AC
  Administered 2017-07-09: 4 mg via INTRAMUSCULAR
  Filled 2017-07-09: qty 1

## 2017-07-09 MED ORDER — OXYCODONE HCL 5 MG PO TABS: 5.0000 mg | ORAL_TABLET | Freq: Four times a day (QID) | ORAL | 0 refills | Status: DC | PRN

## 2017-07-09 NOTE — ED Notes (Signed)
Meal provided per pt request

## 2017-07-09 NOTE — Discharge Instructions (Signed)
Your x-ray shows a fracture of the fifth finger.  Please keep your hand elevated above your heart as much as possible.  Use your splint until seen by an orthopedic specialist.

## 2017-07-09 NOTE — ED Notes (Signed)
To radiology

## 2017-07-09 NOTE — ED Notes (Signed)
Here for continued care for hand he reports he shut into a car door on Monday and was seen for previously.   He has taken no med for pain management and is here for continued pain

## 2017-07-09 NOTE — ED Notes (Signed)
ED Provider at bedside. 
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
## 2017-07-09 NOTE — ED Triage Notes (Signed)
Patient brought up here via EMS. Alert, oriented, and ambulatory. Patient c/o right hand pain with swelling. Patient states shut his hand in car door on Monday. Patient seen here in ER on Tuesday for pain. Denies taking anything for pain.

## 2017-07-09 NOTE — ED Notes (Signed)
From Rad 

## 2017-07-09 NOTE — ED Notes (Signed)
Pt has minimal swelling with full ROM  Speaks of need for his medicaid - rehab    Pt request nabs

## 2017-07-11 ENCOUNTER — Telehealth: Payer: Self-pay | Admitting: Orthopedic Surgery

## 2017-07-11 ENCOUNTER — Telehealth (HOSPITAL_COMMUNITY): Payer: Self-pay | Admitting: Occupational Therapy

## 2017-07-11 NOTE — Telephone Encounter (Signed)
Patient called, previously seen by Dr Hilda LiasKeeling and referred to other orthopaedic specialist - patient states was [facility] on 07/[facility]/17 for new injury of right hand:  "IMPRESSION:Acute oblique fracture of the proximal phalanx of the fifth digit."   Patient aware our providers are out of clinic and had not been on call at [facility], although hospital physician "referred to Dr Romeo AppleHarrison."  [place] Please review and advise. Ph [phone]

## 2017-07-11 NOTE — Telephone Encounter (Signed)
Pt called to R/S, he has not seen his MD yet, Beth advised that patient needs to see MD first and then call us back to R/S. NF

## 2017-07-12 NOTE — Telephone Encounter (Signed)
WHY NOT JUST SEE DR Hilda LiasKEELING ???

## 2017-07-12 NOTE — Telephone Encounter (Signed)
I am in office now and next two days.  If he would like to be seen, I am here.

## 2017-07-13 NOTE — Telephone Encounter (Signed)
Noted  

## 2017-07-14 NOTE — Telephone Encounter (Signed)
Per [facility] Epic chart, patient was [place]. Relayed to patient per Dr Hilda LiasKeeling, Dr Hilda LiasKeeling recommends to go to that appointment, as he does not treat or perform hand surgery.

## 2017-07-15 ENCOUNTER — Emergency Department: Payer: Medicaid Other

## 2017-07-15 ENCOUNTER — Emergency Department
Admission: EM | Admit: 2017-07-15 | Discharge: 2017-07-16 | Disposition: A | Discharge status: Other Acute Inpatient Hospital | Payer: Medicaid Other | Attending: Emergency Medicine | Admitting: Emergency Medicine

## 2017-07-15 DIAGNOSIS — Z046 Encounter for general psychiatric examination, requested by authority: Secondary | ICD-10-CM | POA: Diagnosis not present

## 2017-07-15 DIAGNOSIS — J45909 Unspecified asthma, uncomplicated: Secondary | ICD-10-CM | POA: Diagnosis not present

## 2017-07-15 DIAGNOSIS — R45851 Suicidal ideations: Secondary | ICD-10-CM | POA: Diagnosis not present

## 2017-07-15 DIAGNOSIS — S62646D Nondisplaced fracture of proximal phalanx of right little finger, subsequent encounter for fracture with routine healing: Secondary | ICD-10-CM | POA: Diagnosis not present

## 2017-07-15 DIAGNOSIS — Z9119 Patient's noncompliance with other medical treatment and regimen: Secondary | ICD-10-CM | POA: Diagnosis not present

## 2017-07-15 DIAGNOSIS — F17210 Nicotine dependence, cigarettes, uncomplicated: Secondary | ICD-10-CM | POA: Insufficient documentation

## 2017-07-15 DIAGNOSIS — W2209XD Striking against other stationary object, subsequent encounter: Secondary | ICD-10-CM | POA: Insufficient documentation

## 2017-07-15 DIAGNOSIS — Z79899 Other long term (current) drug therapy: Secondary | ICD-10-CM | POA: Diagnosis not present

## 2017-07-15 DIAGNOSIS — M79644 Pain in right finger(s): Secondary | ICD-10-CM | POA: Diagnosis present

## 2017-07-15 LAB — CBC
HEMATOCRIT: 43 % (ref 40.0–52.0)
Hemoglobin: 14.2 g/dL (ref 13.0–18.0)
MCH: 25.6 pg — ABNORMAL LOW (ref 26.0–34.0)
MCHC: 32.9 g/dL (ref 32.0–36.0)
MCV: 77.9 fL — AB (ref 80.0–100.0)
PLATELETS: 146 10*3/uL — AB (ref 150–440)
RBC: 5.52 MIL/uL (ref 4.40–5.90)
RDW: 13.6 % (ref 11.5–14.5)
WBC: 5.7 10*3/uL (ref 3.8–10.6)

## 2017-07-15 LAB — SALICYLATE LEVEL: Salicylate Lvl: <7 mg/dL (ref 2.8–30.0)

## 2017-07-15 LAB — ETHANOL

## 2017-07-15 LAB — COMPREHENSIVE METABOLIC PANEL
ALK PHOS: 53 U/L (ref 38–126)
ALT: 13 U/L — ABNORMAL LOW (ref 17–63)
ANION GAP: 7 (ref 5–15)
AST: 15 U/L (ref 15–41)
Albumin: 4.1 g/dL (ref 3.5–5.0)
BUN: 15 mg/dL (ref 6–20)
CALCIUM: 8.8 mg/dL — AB (ref 8.9–10.3)
CO2: 25 mmol/L (ref 22–32)
Chloride: 105 mmol/L (ref 101–111)
Creatinine, Ser: 0.92 mg/dL (ref 0.61–1.24)
Glucose, Bld: 99 mg/dL (ref 65–99)
POTASSIUM: 3.7 mmol/L (ref 3.5–5.1)
Sodium: 137 mmol/L (ref 135–145)
TOTAL PROTEIN: 7.6 g/dL (ref 6.5–8.1)
Total Bilirubin: 0.7 mg/dL (ref 0.3–1.2)

## 2017-07-15 LAB — ACETAMINOPHEN LEVEL

## 2017-07-15 NOTE — ED Notes (Signed)

## 2017-07-15 NOTE — ED Notes (Signed)
BEHAVIORAL HEALTH ROUNDING  Patient sleeping: No.  Patient alert and oriented: yes  Behavior appropriate: Yes. ; If no, describe:  Nutrition and fluids offered: Yes  Toileting and hygiene offered: Yes  Sitter present: not applicable, Q 15 min safety rounds and observation.  Law enforcement present: Yes ODS  

## 2017-07-15 NOTE — ED Triage Notes (Signed)
Patient also reports blisters to bilateral feet. Patient reports having surgery on right great toe 3 weeks ago and is worried about infection.
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
## 2017-07-15 NOTE — ED Triage Notes (Signed)
Patient reports SI with plan to cut wrists. Patient reports he is homeless and unable to find work.  Patient reports he slammed his right hand in a door 1 week ago. Patient c/o right hand pain.

## 2017-07-15 NOTE — ED Notes (Signed)
Report given to SOC MD.  

## 2017-07-16 ENCOUNTER — Inpatient Hospital Stay
Admission: AD | Admit: 2017-07-16 | Discharge: 2017-07-20 | DRG: 885 | Disposition: A | Discharge status: Home / Self Care | Payer: Medicaid Other | Source: Intra-hospital | Attending: Psychiatry | Admitting: Psychiatry

## 2017-07-16 ENCOUNTER — Other Ambulatory Visit: Payer: Self-pay

## 2017-07-16 DIAGNOSIS — Z791 Long term (current) use of non-steroidal anti-inflammatories (NSAID): Secondary | ICD-10-CM | POA: Diagnosis not present

## 2017-07-16 DIAGNOSIS — G629 Polyneuropathy, unspecified: Secondary | ICD-10-CM | POA: Diagnosis present

## 2017-07-16 DIAGNOSIS — Z79899 Other long term (current) drug therapy: Secondary | ICD-10-CM | POA: Diagnosis not present

## 2017-07-16 DIAGNOSIS — Z590 Homelessness: Secondary | ICD-10-CM

## 2017-07-16 DIAGNOSIS — F332 Major depressive disorder, recurrent severe without psychotic features: Principal | ICD-10-CM | POA: Diagnosis present

## 2017-07-16 DIAGNOSIS — Z23 Encounter for immunization: Secondary | ICD-10-CM | POA: Diagnosis not present

## 2017-07-16 DIAGNOSIS — S62646D Nondisplaced fracture of proximal phalanx of right little finger, subsequent encounter for fracture with routine healing: Secondary | ICD-10-CM | POA: Diagnosis not present

## 2017-07-16 DIAGNOSIS — G4700 Insomnia, unspecified: Secondary | ICD-10-CM | POA: Diagnosis present

## 2017-07-16 DIAGNOSIS — F419 Anxiety disorder, unspecified: Secondary | ICD-10-CM | POA: Diagnosis present

## 2017-07-16 DIAGNOSIS — M25562 Pain in left knee: Secondary | ICD-10-CM | POA: Diagnosis present

## 2017-07-16 DIAGNOSIS — Z653 Problems related to other legal circumstances: Secondary | ICD-10-CM

## 2017-07-16 DIAGNOSIS — J45909 Unspecified asthma, uncomplicated: Secondary | ICD-10-CM | POA: Diagnosis present

## 2017-07-16 DIAGNOSIS — M542 Cervicalgia: Secondary | ICD-10-CM | POA: Diagnosis present

## 2017-07-16 DIAGNOSIS — G8929 Other chronic pain: Secondary | ICD-10-CM | POA: Diagnosis present

## 2017-07-16 DIAGNOSIS — F17210 Nicotine dependence, cigarettes, uncomplicated: Secondary | ICD-10-CM | POA: Diagnosis present

## 2017-07-16 DIAGNOSIS — Z888 Allergy status to other drugs, medicaments and biological substances status: Secondary | ICD-10-CM | POA: Diagnosis not present

## 2017-07-16 DIAGNOSIS — M549 Dorsalgia, unspecified: Secondary | ICD-10-CM | POA: Diagnosis present

## 2017-07-16 DIAGNOSIS — F333 Major depressive disorder, recurrent, severe with psychotic symptoms: Secondary | ICD-10-CM | POA: Diagnosis not present

## 2017-07-16 DIAGNOSIS — M25561 Pain in right knee: Secondary | ICD-10-CM | POA: Diagnosis present

## 2017-07-16 DIAGNOSIS — Z886 Allergy status to analgesic agent status: Secondary | ICD-10-CM | POA: Diagnosis not present

## 2017-07-16 LAB — URINE DRUG SCREEN, QUALITATIVE (ARMC ONLY)
Amphetamines, Ur Screen: NOT DETECTED
Barbiturates, Ur Screen: NOT DETECTED
Benzodiazepine, Ur Scrn: NOT DETECTED
CANNABINOID 50 NG, UR ~~LOC~~: NOT DETECTED
COCAINE METABOLITE, UR ~~LOC~~: NOT DETECTED
MDMA (Ecstasy)Ur Screen: NOT DETECTED
Methadone Scn, Ur: NOT DETECTED
OPIATE, UR SCREEN: NOT DETECTED
PHENCYCLIDINE (PCP) UR S: NOT DETECTED
Tricyclic, Ur Screen: NOT DETECTED

## 2017-07-16 MED ORDER — ALPRAZOLAM ER 1 MG PO TB24
2.0000 mg | ORAL_TABLET | Freq: Every day | ORAL | Status: DC
  Administered 2017-07-16 – 2017-07-18 (×3): 2 mg via ORAL
  Filled 2017-07-16 (×3): qty 2

## 2017-07-16 MED ORDER — INFLUENZA VAC SPLIT QUAD 0.5 ML IM SUSY
0.5000 mL | PREFILLED_SYRINGE | INTRAMUSCULAR | Status: AC
  Administered 2017-07-17: 0.5 mL via INTRAMUSCULAR
  Filled 2017-07-16: qty 0.5

## 2017-07-16 MED ORDER — ALUM & MAG HYDROXIDE-SIMETH 200-200-20 MG/5ML PO SUSP: 30.0000 mL | ORAL | Status: DC | PRN

## 2017-07-16 MED ORDER — PREDNISONE 20 MG PO TABS: 40.0000 mg | ORAL_TABLET | Freq: Every day | ORAL | Status: DC

## 2017-07-16 MED ORDER — ACETAMINOPHEN 325 MG PO TABS: 650.0000 mg | ORAL_TABLET | Freq: Four times a day (QID) | ORAL | Status: DC | PRN

## 2017-07-16 MED ORDER — BENZONATATE 100 MG PO CAPS
100.0000 mg | ORAL_CAPSULE | Freq: Three times a day (TID) | ORAL | Status: DC
  Administered 2017-07-16 – 2017-07-20 (×12): 100 mg via ORAL
  Filled 2017-07-16 (×13): qty 1

## 2017-07-16 MED ORDER — GABAPENTIN 300 MG PO CAPS
600.0000 mg | ORAL_CAPSULE | Freq: Three times a day (TID) | ORAL | Status: DC
  Administered 2017-07-16 – 2017-07-20 (×12): 600 mg via ORAL
  Filled 2017-07-16 (×12): qty 2

## 2017-07-16 MED ORDER — HYDROXYZINE HCL 50 MG PO TABS
50.0000 mg | ORAL_TABLET | Freq: Four times a day (QID) | ORAL | Status: DC | PRN
  Administered 2017-07-19: 50 mg via ORAL
  Filled 2017-07-16: qty 1

## 2017-07-16 MED ORDER — TRAZODONE HCL 50 MG PO TABS
50.0000 mg | ORAL_TABLET | Freq: Every evening | ORAL | Status: DC | PRN
  Administered 2017-07-19: 50 mg via ORAL
  Filled 2017-07-16 (×3): qty 1

## 2017-07-16 MED ORDER — OXYCODONE HCL 5 MG PO TABS
5.0000 mg | ORAL_TABLET | Freq: Four times a day (QID) | ORAL | Status: DC | PRN
  Administered 2017-07-16 – 2017-07-18 (×6): 5 mg via ORAL
  Filled 2017-07-16 (×6): qty 1

## 2017-07-16 MED ORDER — MAGNESIUM HYDROXIDE 400 MG/5ML PO SUSP: 30.0000 mL | Freq: Every day | ORAL | Status: DC | PRN
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
## 2017-07-16 NOTE — Progress Notes (Signed)
Pt A & O X4. Denies AVH and pain at this time. Continues to endorse +SI & HI on assessment . Presents guarded, isolative to room with depressed affect and mood. Tolerates all PO intake well. Pt in agreement with transfer to BMU for continuation of care. Report called to receiving nurse Tyawn RN on BMU. Emotional support, encouragement and availability offered to pt.   Q 15 minutes safety checks maintained without self harm gestures or outburst to note thus far.

## 2017-07-16 NOTE — Plan of Care (Signed)
Patient newly admitted

## 2017-07-16 NOTE — ED Notes (Signed)
[facility]. [facility]

## 2017-07-16 NOTE — Progress Notes (Signed)
Splint ordered for fx'd right pinky finger. [facility], the finger splint could not be used on the unit d/t the metal in the splint. A splint made for patient with paper and tape to secure finger.
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
## 2017-07-16 NOTE — ED Notes (Signed)

## 2017-07-16 NOTE — ED Notes (Signed)
Pt. To BHU from ED ambulatory without difficulty, to room [facility]. [facility]. Pt. Is alert and oriented, warm and dry in no distress. Pt. Denies SI, HI, and AVH. Pt. Calm and cooperative. Pt's right 5th finger re-buddy tapped to fourth finger and pt educated on importance of keeping finger taped to other finger because of brake in 5th finger. Pt. Made aware of security cameras and Q15 minute rounds. Pt. Encouraged to let Nursing staff know of any concerns or needs.
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
## 2017-07-16 NOTE — ED Notes (Signed)
BEHAVIORAL HEALTH ROUNDING Patient sleeping: Yes.   Patient alert and oriented: not applicable SLEEPING Behavior appropriate: Yes.  ; If no, describe: SLEEPING Nutrition and fluids offered: No SLEEPING Toileting and hygiene offered: NoSLEEPING Sitter present: not applicable, Q 15 min safety rounds and observation. Law enforcement present: Yes ODS 

## 2017-07-16 NOTE — ED Notes (Addendum)
[facility] Dr[facility] Mikey CollegeMcKnew.  Attending Physician will be Dr. Mikey CollegeMcKnew.   Patient has been assigned to room 322, [facility] Nurse  Warren AFBPhyllis.   Intake Paper Work has been signed and placed on patient chart.  ER staff is aware of the admission [facility](  [facility].; ER MD;  [facility] & Marylene LandAngela Patient Access).

## 2017-07-17 DIAGNOSIS — F333 Major depressive disorder, recurrent, severe with psychotic symptoms: Secondary | ICD-10-CM

## 2017-07-17 MED ORDER — HALOPERIDOL 5 MG PO TABS
10.0000 mg | ORAL_TABLET | Freq: Every day | ORAL | Status: DC
  Administered 2017-07-17 – 2017-07-19 (×3): 10 mg via ORAL
  Filled 2017-07-17 (×3): qty 2

## 2017-07-17 MED ORDER — NICOTINE 21 MG/24HR TD PT24
21.0000 mg | MEDICATED_PATCH | Freq: Every day | TRANSDERMAL | Status: DC
  Administered 2017-07-17 – 2017-07-18 (×2): 21 mg via TRANSDERMAL
  Filled 2017-07-17 (×3): qty 1
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
## 2017-07-17 NOTE — BHH Group Notes (Signed)
07/17/2017 1:15pm  Type of Therapy and Topic:  Group Therapy:  Healthy Self Image and Positive Change  Participation Level:  Active   Description of Group:  In this group, patients will compare and contrast their current "I am...." statements to the visions they identify as desirable for their lives.  Patients discuss fears and how they can make positive changes in their cognitions that will positively impact their behaviors.  Facilitator played a motivational 3-minute speech and patients were left with the task of thinking about what "I am...." statements they can start using in their lives immediately.  Therapeutic Goals: 1. Patient will state their current self-perception as expressed in an "I Am" statement 2. Patient will contrast this with their desired vision for their live 3. Patient will identify 3 fears that negatively impact their behavior 4. Patient will discuss cognitive distortions that stem from their fears 5. Patient will verbalize statements that challenge their cognitive distortions  Summary of Patient Progress:  Pt stated " I am a handsome man". He reports that he is stressed but glad that he is in group. Pt identified his cognitive distortions related to his fears and the challenges to stay positive. Pt actively participated in group.     Therapeutic Modalities Cognitive Behavioral Therapy Motivational Interviewing  Johnnye SimaNNIA  CUEBAS-COLON, LCSW 07/17/2017 12:43 PM

## 2017-07-17 NOTE — Progress Notes (Signed)
Patient is exhibiting poor sleeping habits, patient is sleeping on the floor and refused the bed  And up every 2 hours or less walking around the unit aimlessly, patient is monitored for safety, wants to know when his morning medications are due, patient is stable no distress notes.

## 2017-07-17 NOTE — Progress Notes (Signed)
D: Patient is guarded and forwards little.  He is interacting better with staff and the day progresses.  He has requested several numbers and states, "Everybody has a job.  I don't have an apartment because I have no job.  Everybody gets disability.  I can't get disability.  Do you know a good lawyer?"  Patient reports passive thoughts of self harm and is able to contract for safety on the unit.  He is compliant with his medications.  Patient continues to have pain in right pinky finger which he states "is broken."  Patient states he "slammed my hand in a door." Right hand has been x-rayed and a fracture is evident on fifth finger (pinky) of right hand. A: Continue to monitor medication management and MD orders.  Safety checks completed every 15 minutes per protocol.  Offer support and encouragement as needed. R: Patient is receptive to staff; his behavior is appropriate.

## 2017-07-17 NOTE — BHH Suicide Risk Assessment (Signed)
B[facility] INPATIENT:  Family/Significant Other Suicide Prevention Education  Suicide Prevention Education:  Patient Refusal for Family/Significant Other Suicide Prevention Education: The patient Cole [name] has refused to provide written consent for family/significant other to be provided Family/Significant Other Suicide Prevention Education during admission and/or prior to discharge.  Physician notified.  Charelle Petrakis  CUEBAS-COLON 07/17/2017, 5:12 PM

## 2017-07-17 NOTE — BHH Counselor (Signed)
Adult Comprehensive Assessment  Patient ID: Cole [name], male   DOB: 04/21/1981, 36 y.o.   MRN: [id number]  Information Source: Information source: Patient  Current Stressors:  Educational / Learning stressors: none reported Employment / Job issues: pt reports that he cannot understand what was going on Family Relationships: pt report that they are bad, 34 yo girl-good relationship family argue with him all the time for no reason Financial / Lack of resources (include bankruptcy): unemployed, problems with unpaid child support, and Engineer, drillingprobation officer Housing / Lack of housing: homeless Physical health (include injuries & life threatening diseases): hand, foot, back injuries Social relationships: pt reports that he does not have a lot of friends Substance abuse: none reported Bereavement / Loss: pt reports that his aunt passed las Friday  Living/Environment/Situation:  Living Arrangements: Alone Living conditions (as described by patient or guardian): homeless How long has patient lived in current situation?: 3 weeks What is atmosphere in current home: Chaotic, Dangerous, Temporary  Family History:  Marital status: Single Are you sexually active?: Yes What is your sexual orientation?: heterosexual Has your sexual activity been affected by drugs, alcohol, medication, or emotional stress?: yes- emtional stress Does patient have children?: Yes How many children?: 1 How is patient's relationship with their children?: 34 yo girl-good relationship  Childhood History:  By whom was/is the patient raised?: Mother Additional childhood history information: dad in [place]. talk to him on the phone-not much of a relationship Description of patient's relationship with caregiver when they were a child: great Patient's description of current relationship with people who raised him/her: pt reports that mom argues with him all the time How were you disciplined when you got in trouble as a child/adolescent?: no  trouble to get discipline Does patient have siblings?: Yes Number of Siblings: 4 Description of patient's current relationship with siblings: 34 yo brother maternal and 1 brother and 2 sisters paternal side Did patient suffer any verbal/emotional/physical/sexual abuse as a child?: No Did patient suffer from severe childhood neglect?: No Has patient ever been sexually abused/assaulted/raped as an adolescent or adult?: No Was the patient ever a victim of a crime or a disaster?: No Witnessed domestic violence?: No Has patient been effected by domestic violence as an adult?: No  Education:  Highest grade of school patient has completed: 12 Currently a student?: No Name of school: [employer] Learning disability?: No  Employment/Work Situation:   Employment situation: Unemployed Patient's job has been impacted by current illness: Yes Describe how patient's job has been impacted: anxiety, depression What is the longest time patient has a held a job?: 2 years Where was the patient employed at that time?: [employer] Has patient ever been in the Eli Lilly and Companymilitary?: No Has patient ever served in combat?: No Did You Receive Any Psychiatric Treatment/Services While in Equities traderthe Military?: No Are There Guns or Other Weapons in Your Home?: No  Financial Resources:   Financial resources: No income Does patient have a Lawyerrepresentative payee or guardian?: No  Alcohol/Substance Abuse:   What has been your use of drugs/alcohol within the last 12 months?: Pt denies If attempted suicide, did drugs/alcohol play a role in this?: No Alcohol/Substance Abuse Treatment Hx: Past Tx, Outpatient Has alcohol/substance abuse ever caused legal problems?: No  Social Support System:   Conservation officer, natureatient's Community Support System: Good Describe Community Support System: pt reports that he has a good support system- psychiatrist, aunt Type of faith/religion: Baptist How does patient's faith help to cope with current illness?: praying    Leisure/Recreation:  Leisure and Hobbies: basketball, reading, go to the internet  Strengths/Needs:   What things does the patient do well?: work, help other people In what areas does patient struggle / problems for patient: could never keep a job  Discharge Plan:   Does patient have access to transportation?: Yes Will patient be returning to same living situation after discharge?: Yes Currently receiving community mental health services: Yes (From Whom)([name]) If no, would patient like referral for services when discharged?: ([facility]) Does patient have financial barriers related to discharge medications?: No  Summary/Recommendations:   Summary and Recommendations (to be completed by the evaluator): Pt is a 36 year old male from [place], with a history of depression with psychosis, anxiety, substance use history, presenting with suicidal ideations without intent or plan, in context of multiple stressors. Pt is diagnosed with severe recurrent major depression. Recommendations for pt. include crisis stabilization, therapeutic milieu, attend and participate in groups, medication management, and development of comprehensive mental wellness and substance use recovery plan.  Larri Brewton  CUEBAS-COLON. 07/17/2017
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
## 2017-07-18 ENCOUNTER — Encounter: Payer: Medicaid Other | Admitting: Podiatry

## 2017-07-18 MED ORDER — ALPRAZOLAM ER 1 MG PO TB24
1.0000 mg | ORAL_TABLET | Freq: Every day | ORAL | Status: DC
  Administered 2017-07-19 – 2017-07-20 (×2): 1 mg via ORAL
  Filled 2017-07-18 (×2): qty 1

## 2017-07-18 MED ORDER — TRAMADOL HCL 50 MG PO TABS
50.0000 mg | ORAL_TABLET | Freq: Four times a day (QID) | ORAL | Status: DC | PRN
  Administered 2017-07-18 – 2017-07-19 (×2): 50 mg via ORAL
  Filled 2017-07-18 (×2): qty 1

## 2017-07-18 NOTE — Plan of Care (Signed)
Patient is quiet and soft spoken. He is passive about si, with no plan. He denies hi, avh. He is med compliant. Pain in his right pinky states he broke it a week ago. It is splinted up. He will make his needs known. Will continue to monitor.

## 2017-07-18 NOTE — [facility] Group Notes (Signed)
07/18/2017 0930   Type of Therapy and Topic:  Group Therapy:  Overcoming Obstacles   Participation Level:  Active   Description of Group:   In this group patients will be encouraged to explore what they see as obstacles to their own wellness and recovery. They will be guided to discuss their thoughts, feelings, and behaviors related to these obstacles. The group will process together ways to cope with barriers, with attention given to specific choices patients can make. Each patient will be challenged to identify changes they are motivated to make in order to overcome their obstacles. This group will be process-oriented, with patients participating in exploration of their own experiences, giving and receiving support, and processing challenge from other group members.   Therapeutic Goals: 1. Patient will identify personal and current obstacles as they relate to admission. 2. Patient will identify barriers that currently interfere with their wellness or overcoming obstacles.  3. Patient will identify feelings, thought process and behaviors related to these barriers. 4. Patient will identify two changes they are willing to make to overcome these obstacles:      Summary of Patient Progress Pt continues to work towards his treatment goals. Pt attended group for the last ten minutes. Pt was able to participate appropriately in group discussions and was able to offer validation and support to other group members. Pt reported that his short term obstacle is, "being unemployed and homeless." Pt reported that his long term obstacle is, "probation, and being able to get a job with my criminal history." Pt reported that something he can do to work towards overcoming these obstacles are, "trying to be stable and staying on medication."     Therapeutic Modalities:   Cognitive Behavioral Therapy Solution Focused Therapy Motivational Interviewing Relapse Prevention Therapy  Heidi DachKelsey Alayssa Flinchum, MSW,  LCSW 07/18/2017 12:38 PM

## 2017-07-18 NOTE — Progress Notes (Signed)
Recreation Therapy Notes   Date: 11.19.18  Time: 1:00 pm  Location: Craft Room  Behavioral response: N/A  Intervention Topic: Coping Skills  Discussion/Intervention: Patient did not attend group. Clinical Observations/Feedback:  Patient did not attend group. Amarra Sawyer LRT/CTRS            Hughey Rittenberry 07/18/2017 2:48 PM

## 2017-07-18 NOTE — Plan of Care (Signed)
  Coping: Ability to cope will improve 07/18/2017 0516 - Progressing by Trula OreAjetunmobi, Natalyah Cummiskey Abisola, RN
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
## 2017-07-18 NOTE — [facility] Group Notes (Signed)
[facility] Group Notes:  (Nursing/MHT/Case Management/Adjunct)  Date:  07/18/2017  Time:  4:10 PM  Type of Therapy:  Psychoeducational Skills  Participation Level:  Minimal  Participation Quality:  Drowsy and Sharing  Affect:  Appropriate  Cognitive:  Lacking  Insight:  Lacking  Engagement in Group:  Engaged  Modes of Intervention:  Discussion and Education  Summary of Progress/Problems:  Mickey Farberamela M Moses Ellison 07/18/2017, 4:10 PM

## 2017-07-18 NOTE — Progress Notes (Signed)
D: Patient is alert and oriented x 3 with confusion to situation noted, denies SI/HI/AVH. Pt is pleasant and cooperative with treatment plan, he appears less anxious and he is interacting with peers and staff appropriately. Patient's thought process was noted to be disorganized, speech tangential.  A: Pt was offered support and encouragement and given scheduled medications. Pt was encouraged to attend groups, 15 minute checks were done for safety.  R: Pt attends group and interacts well with peers and staff, he is complaint with medication and receptive to treatment. Safety maintained on unit will continue to monitor.

## 2017-07-18 NOTE — [facility] Group Notes (Signed)
[facility] Group Notes:  (Nursing/MHT/Case Management/Adjunct)  Date:  07/18/2017  Time:  2:34 AM  Type of Therapy:  Psychoeducational Skills  Participation Level:  Active  Participation Quality:  Appropriate and Drowsy  Affect:  Appropriate  Cognitive:  Appropriate  Insight:  Appropriate and Good  Engagement in Group:  Engaged  Modes of Intervention:  Discussion, Socialization and Support  Summary of Progress/Problems:  Wilson SingerJustin  Angellina Ferdinand 07/18/2017, 2:34 AM

## 2017-07-18 NOTE — Plan of Care (Signed)
  Coping: Ability to cope will improve 07/18/2017 0517 - Progressing by Trula OreAjetunmobi, Komal Stangelo Abisola, RN 07/18/2017 [phone] - Progressing by Trula OreAjetunmobi, Leray Garverick Abisola, RN

## 2017-07-19 MED ORDER — GABAPENTIN 600 MG PO TABS: 600.0000 mg | ORAL_TABLET | Freq: Three times a day (TID) | ORAL | 0 refills | Status: DC

## 2017-07-19 MED ORDER — TRAMADOL HCL 50 MG PO TABS: 50.0000 mg | ORAL_TABLET | Freq: Four times a day (QID) | ORAL | 0 refills | Status: DC | PRN

## 2017-07-19 MED ORDER — HALOPERIDOL 10 MG PO TABS: 10.0000 mg | ORAL_TABLET | Freq: Every day | ORAL | 0 refills | Status: DC

## 2017-07-19 NOTE — Progress Notes (Signed)
Recreation Therapy Notes  Date: 11.20.18  Time: 1:00 pm  Location: Craft Room  Behavioral response: N/A  Intervention Topic: Time Management  Discussion/Intervention: Patient did not attend group. Clinical Observations/Feedback:  Patient did not attend group. Atlas Kuc LRT/CTRS           Reshonda Koerber 07/19/2017 2:36 PM
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
## 2017-07-19 NOTE — Progress Notes (Signed)
D: Pt is alert and oriented x 4, endorses passive SI but contracts for safety, denies HI/AVH. Patient is cooperative with treatment, he appears less anxious and he is interacting with peers and staff appropriately.  A: Pt was offered support and encouragement, given scheduled medications and  encouraged to attend groups. Q 15 minute checks were done for safety.  R:Pt attends groups and interacts well with peers and staff. Pt is taking medication. Pt has no complaints.Pt receptive to treatment and safety maintained on unit.
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
## 2017-07-19 NOTE — [facility] Group Notes (Signed)
[facility] Group Notes:  (Nursing/MHT/Case Management/Adjunct)  Date:  07/19/2017  Time:  12:16 AM  Type of Therapy:  Evening Wrap-up Group  Participation Level:  Active  Participation Quality:  Appropriate  Affect:  Appropriate  Cognitive:  Appropriate  Insight:  Appropriate and Improving  Engagement in Group:  Developing/Improving and Engaged  Modes of Intervention:  Discussion  Summary of Progress/Problems:  Cole MorrowChelsea Nanta [name] 07/19/2017, 12:16 AM

## 2017-07-19 NOTE — Plan of Care (Signed)
  Activity: Interest or engagement in leisure activities will improve 07/19/2017 0429 - Progressing by Trula OreAjetunmobi, Canisha Issac Abisola, RN

## 2017-07-19 NOTE — Progress Notes (Signed)
Recreation Therapy Notes  INPATIENT RECREATION THERAPY ASSESSMENT  Patient Details Name: Ernst BreachJason M Tyndall MRN: [id number] DOB: 06/08/1981 Today's Date: 07/19/2017  Patient Stressors: Family, Friends, Work, Other (Comment)(Being homeless)  Coping Skills:   Exercise  Personal Challenges: Work Nutritional therapisterformance  Leisure Interests (2+):  Games - Clinical cytogeneticistVideo games  Awareness of Community Resources:  Yes  Community Resources:  [employer]  Current Use: Yes  If no, Barriers?:    Patient Strengths:  Helping others  Patient Identified Areas of Improvement:  Take care of responsibilities  Current Recreation Participation:  Playing the game  Patient Goal for Hospitalization:  To improve people skills  [place]  County of Residence:  [place]   Current SI (including self-harm):  No  Current HI:  No  Consent to Intern Participation: N/A   Sherwin Hollingshed 07/19/2017, 3:14 PM

## 2017-07-19 NOTE — Progress Notes (Signed)
D- Patient alert and oriented. Patient presents in a pleasant mood. Patient denies SI, HI, AVH, and pain at this time. Patient denies that he is having any problems to this writer at this time. Patient's most important goal is to "find somewhere to stay" and he will accomplish this goal by "talk to someone".                                                                                                                               A- Scheduled medications administered to patient, per MD orders. Support and encouragement provided.  Routine safety checks conducted every 15 minutes.  Patient informed to notify staff with problems or concerns.  R- No adverse drug reactions noted. Patient contracts for safety at this time. Patient compliant with medications and treatment plan. Patient receptive, calm, and cooperative. Patient interacts well with others on the unit.  Patient remains safe at this time.

## 2017-07-20 ENCOUNTER — Ambulatory Visit (INDEPENDENT_AMBULATORY_CARE_PROVIDER_SITE_OTHER): Payer: Medicaid Other | Admitting: Orthopaedic Surgery

## 2017-07-20 MED ORDER — GABAPENTIN 600 MG PO TABS: 600.0000 mg | ORAL_TABLET | Freq: Three times a day (TID) | ORAL | 0 refills | Status: DC

## 2017-07-20 MED ORDER — ALPRAZOLAM ER 1 MG PO TB24: 1.0000 mg | ORAL_TABLET | Freq: Every day | ORAL | Status: DC

## 2017-07-20 MED ORDER — HALOPERIDOL 10 MG PO TABS: 10.0000 mg | ORAL_TABLET | Freq: Every day | ORAL | 0 refills | Status: DC

## 2017-07-20 NOTE — [facility] Group Notes (Signed)
  [facility] LCSW Group Therapy Note  Date/Time: 07/20/17, 0930  Type of Therapy/Topic:  Group Therapy:  Emotion Regulation  Participation Level:  Did Not Attend   Mood:  Description of Group:    The purpose of this group is to assist patients in learning to regulate negative emotions and experience positive emotions. Patients will be guided to discuss ways in which they have been vulnerable to their negative emotions. These vulnerabilities will be juxtaposed with experiences of positive emotions or situations, and patients challenged to use positive emotions to combat negative ones. Special emphasis will be placed on coping with negative emotions in conflict situations, and patients will process healthy conflict resolution skills.  Therapeutic Goals: 1. Patient will identify two positive emotions or experiences to reflect on in order to balance out negative emotions:  2. Patient will label two or more emotions that they find the most difficult to experience:  3. Patient will be able to demonstrate positive conflict resolution skills through discussion or role plays:   Summary of Patient Progress:       Therapeutic Modalities:   Cognitive Behavioral Therapy Feelings Identification Dialectical Behavioral Therapy  Greg Jasara Corrigan, LCSW 

## 2017-07-20 NOTE — Progress Notes (Signed)
  [name]  Will you be returning to the same living situation after discharge:  Yes,    At discharge, do you have transportation home?: Yes,  [facility] FARE PROVIDED Do you have the ability to pay for your medications: Yes,   However given 7 day supply as he has no income until 12/1  Release of information consent forms completed and in the chart;  Patient's signature needed at discharge.  Patient to Follow up at: Follow-up Information    Lynett FishSu, [name], MD Follow up.   Specialty:  Psychiatry Why:  Please attend your follow up appointment with [name] on Monday, 08/15/17, at 9:00am.  Please bring a copy of your hospital discharge paperwork. Contact information: [address] [place] [address] [phone]           Next level of care provider [facility]no  Safety Planning and Suicide Prevention discussed: Yes,     Have you used any form of tobacco in the last 30 days? (Cigarettes, Smokeless Tobacco, Cigars, and/or Pipes): Yes  Has patient been referred to the Quitline?: Patient refused referral  Patient has been referred for addiction treatment: Yes  [name], LCSW 07/20/2017, 2:54 PM

## 2017-07-20 NOTE — Tx Team (Signed)
Interdisciplinary Treatment and Diagnostic Plan Update  07/20/2017 Time of Session: 11:10am Cole BreachJason M [name] MRN: [id number]  Principal Diagnosis: Severe recurrent major depression (HCC)  Secondary Diagnoses: Principal Problem:   Severe recurrent major depression (HCC)   Current Medications:  Current Facility-Administered Medications  Medication Dose Route Frequency Provider Last Rate Last Dose  . ALPRAZolam (XANAX XR) 24 hr tablet 1 mg  1 mg Oral Daily McNew, Ileene HutchinsonHolly R, MD   1 mg at 07/20/17 0909  . alum & mag hydroxide-simeth (MAALOX/MYLANTA) 200-200-20 MG/5ML suspension 30 mL  30 mL Oral Q4H PRN Beverly SessionsSubedi, Jagannath, MD      . benzonatate (TESSALON) capsule 100 mg  100 mg Oral TID Beverly SessionsSubedi, Jagannath, MD   100 mg at 07/20/17 1214  . gabapentin (NEURONTIN) capsule 600 mg  600 mg Oral TID Beverly SessionsSubedi, Jagannath, MD   600 mg at 07/20/17 1214  . haloperidol (HALDOL) tablet 10 mg  10 mg Oral QHS Beverly SessionsSubedi, Jagannath, MD   10 mg at 07/19/17 2151  . hydrOXYzine (ATARAX/VISTARIL) tablet 50 mg  50 mg Oral Q6H PRN Beverly SessionsSubedi, Jagannath, MD   50 mg at 07/19/17 2151  . magnesium hydroxide (MILK OF MAGNESIA) suspension 30 mL  30 mL Oral Daily PRN Beverly SessionsSubedi, Jagannath, MD      . nicotine (NICODERM CQ - dosed in mg/24 hours) patch 21 mg  21 mg Transdermal Daily McNew, Ileene HutchinsonHolly R, MD   21 mg at 07/18/17 0810  . traMADol (ULTRAM) tablet 50 mg  50 mg Oral Q6H PRN Haskell RilingMcNew, Holly R, MD   50 mg at 07/19/17 2151  . traZODone (DESYREL) tablet 50 mg  50 mg Oral QHS PRN Beverly SessionsSubedi, Jagannath, MD   50 mg at 07/19/17 2151   PTA Medications: Medications Prior to Admission  Medication Sig Dispense Refill Last Dose  . benzonatate (TESSALON) 100 MG capsule Take 1 capsule (100 mg total) by mouth every 8 (eight) hours. 21 capsule 0   . cetirizine-pseudoephedrine (ZYRTEC-D) 5-120 MG tablet Take 1 tablet by mouth 2 (two) times daily. (Patient not taking: Reported on 05/16/2017) 20 tablet 0 Not Taking  . cyclobenzaprine (FLEXERIL) 5 MG tablet Take  1 tablet (5 mg total) by mouth 2 (two) times daily as needed for muscle spasms. (Patient not taking: Reported on 05/30/2017) 5 tablet 0 Not Taking  . dexamethasone (DECADRON) 4 MG tablet Take 1 tablet (4 mg total) by mouth 2 (two) times daily with a meal. (Patient not taking: Reported on 05/16/2017) 12 tablet 0 Not Taking  . diclofenac (VOLTAREN) 75 MG EC tablet Take 1 tablet (75 mg total) by mouth 2 (two) times daily. (Patient not taking: Reported on 05/16/2017) 20 tablet 0 Not Taking  . diphenhydrAMINE (BENYLIN) 12.5 MG/5ML syrup Take 5 mLs (12.5 mg total) by mouth every 6 (six) hours as needed for allergies. Use every 6 hours with ultram if needed. (Patient not taking: Reported on 05/16/2017) 120 mL 1 Not Taking  . HYDROcodone-acetaminophen (NORCO/VICODIN) 5-325 MG tablet One tablet by mouth every six hours as needed for pain.  Seven day limit. (Patient not taking: Reported on 06/03/2017) 28 tablet 0 Not Taking  . HYDROcodone-acetaminophen (NORCO/VICODIN) 5-325 MG tablet Take 1 tablet by mouth every 6 (six) hours as needed. 4 tablet 0   . lidocaine (LIDODERM) 5 % Place 1 patch onto the skin daily. Remove & Discard patch within 12 hours or as directed by MD (Patient not taking: Reported on 05/16/2017) 30 patch 0 Not Taking  . methocarbamol (ROBAXIN) 500 MG tablet Take  1 tablet (500 mg total) by mouth 2 (two) times daily. (Patient not taking: Reported on 05/16/2017) 20 tablet 0 Not Taking  . naproxen (NAPROSYN) 250 MG tablet Take by mouth 3 (three) times daily with meals.   Taking  . oxyCODONE (ROXICODONE) 5 MG immediate release tablet Take 1 tablet (5 mg total) every 6 (six) hours as needed by mouth for severe pain. 12 tablet 0   . predniSONE (DELTASONE) 20 MG tablet Take 2 tablets (40 mg total) by mouth daily with breakfast. 6 tablet 0     Patient Stressors:    Patient Strengths:    Treatment Modalities: Medication Management, Group therapy, Case management,  1 to 1 session with clinician,  Psychoeducation, Recreational therapy.   Physician Treatment Plan for Primary Diagnosis: Severe recurrent major depression (HCC) Long Term Goal(s): Improvement in symptoms so as ready for discharge Improvement in symptoms so as ready for discharge   Short Term Goals: Ability to identify changes in lifestyle to reduce recurrence of condition will improve Ability to verbalize feelings will improve Ability to disclose and discuss suicidal ideas Ability to demonstrate self-control will improve Ability to identify and develop effective coping behaviors will improve Ability to maintain clinical measurements within normal limits will improve Compliance with prescribed medications will improve Ability to identify triggers associated with substance abuse/mental health issues will improve Ability to identify changes in lifestyle to reduce recurrence of condition will improve Ability to verbalize feelings will improve Ability to disclose and discuss suicidal ideas Ability to demonstrate self-control will improve Ability to identify and develop effective coping behaviors will improve Ability to maintain clinical measurements within normal limits will improve Compliance with prescribed medications will improve Ability to identify triggers associated with substance abuse/mental health issues will improve  Medication Management: Evaluate patient's response, side effects, and tolerance of medication regimen.  Therapeutic Interventions: 1 to 1 sessions, Unit Group sessions and Medication administration.  Evaluation of Outcomes: Adequate for discharge  Physician Treatment Plan for Secondary Diagnosis: Principal Problem:   Severe recurrent major depression (HCC)  Long Term Goal(s): Improvement in symptoms so as ready for discharge Improvement in symptoms so as ready for discharge   Short Term Goals: Ability to identify changes in lifestyle to reduce recurrence of condition will improve Ability to  verbalize feelings will improve Ability to disclose and discuss suicidal ideas Ability to demonstrate self-control will improve Ability to identify and develop effective coping behaviors will improve Ability to maintain clinical measurements within normal limits will improve Compliance with prescribed medications will improve Ability to identify triggers associated with substance abuse/mental health issues will improve Ability to identify changes in lifestyle to reduce recurrence of condition will improve Ability to verbalize feelings will improve Ability to disclose and discuss suicidal ideas Ability to demonstrate self-control will improve Ability to identify and develop effective coping behaviors will improve Ability to maintain clinical measurements within normal limits will improve Compliance with prescribed medications will improve Ability to identify triggers associated with substance abuse/mental health issues will improve     Medication Management: Evaluate patient's response, side effects, and tolerance of medication regimen.  Therapeutic Interventions: 1 to 1 sessions, Unit Group sessions and Medication administration.  Evaluation of Outcomes: Adequate for Discharge   RN Treatment Plan for Primary Diagnosis: Severe recurrent major depression (HCC) Long Term Goal(s): Knowledge of disease and therapeutic regimen to maintain health will improve  Short Term Goals: Ability to participate in decision making will improve, Ability to identify and develop effective coping behaviors  will improve and Compliance with prescribed medications will improve  Medication Management: RN will administer medications as ordered by provider, will assess and evaluate patient's response and provide education to patient for prescribed medication. RN will report any adverse and/or side effects to prescribing provider.  Therapeutic Interventions: 1 on 1 counseling sessions, Psychoeducation, Medication  administration, Evaluate responses to treatment, Monitor vital signs and CBGs as ordered, Perform/monitor CIWA, COWS, AIMS and Fall Risk screenings as ordered, Perform wound care treatments as ordered.  Evaluation of Outcomes: Adequate for Discharge   LCSW Treatment Plan for Primary Diagnosis: Severe recurrent major depression (HCC) Long Term Goal(s): Safe transition to appropriate next level of care at discharge, Engage patient in therapeutic group addressing interpersonal concerns.  Short Term Goals: Engage patient in aftercare planning with referrals and resources, Identify triggers associated with mental health/substance abuse issues and Increase skills for wellness and recovery  Therapeutic Interventions: Assess for all discharge needs, 1 to 1 time with Social worker, Explore available resources and support systems, Assess for adequacy in community support network, Educate family and significant other(s) on suicide prevention, Complete Psychosocial Assessment, Interpersonal group therapy.  Evaluation of Outcomes: Adequate for Discharge   Progress in Treatment: Attending groups: Yes. Participating in groups: Yes. Taking medication as prescribed: Yes. Toleration medication: Yes. Family/Significant other contact made: No, will contact:  refused Patient understands diagnosis: Yes. Discussing patient identified problems/goals with staff: Yes. Medical problems stabilized or resolved: Yes. Denies suicidal/homicidal ideation: Yes. Issues/concerns per patient self-inventory: No. Other:    New problem(s) identified: No, Describe:     New Short Term/Long Term Goal(s):  Discharge Plan or Barriers:   Reason for Continuation of Hospitalization: Medication stabilization  Estimated Length of Stay: 0 days  Attendees: Patient: 07/20/2017 2:50 PM  Physician: Corinna GabHolly McNew 07/20/2017 2:50 PM  Nursing: Hulan AmatoGwen FArrish, RN 07/20/2017 2:50 PM  RN Care Manager: 07/20/2017 2:50 PM  Social Worker:  Jake SharkSara Sonali Wivell, LCSW 07/20/2017 2:50 PM  Recreational Therapist: Garret ReddishShay Outlaw, LRT 07/20/2017 2:50 PM  Other:  07/20/2017 2:50 PM  Other:  07/20/2017 2:50 PM  Other: 07/20/2017 2:50 PM    Scribe for Treatment Team: Glennon MacSara P Bethanee Redondo, LCSW 07/20/2017 2:50 PM

## 2017-07-20 NOTE — Progress Notes (Signed)
Recreation Therapy Notes  INPATIENT RECREATION TR PLAN  Patient Details Name: Cole [name] MRN: [id number] DOB: 26-Mar-1981 Today's Date: 07/20/2017  Rec Therapy Plan Is patient appropriate for Therapeutic Recreation?: Yes Treatment times per week: at least 3 Estimated Length of Stay: 5-7 days TR Treatment/Interventions: Group participation (Comment)(Appropriate patticipation in recreation therapy tx.)  Discharge Criteria Pt will be discharged from therapy if:: Discharged Treatment plan/goals/alternatives discussed and agreed upon by:: Patient/family  Discharge Summary Short term goals set: STG-The Patient will attend and participate in Recreation Therapy Group Sessions x5 days.  Short term goals met: Adequate for discharge Progress toward goals comments: Groups attended Which groups?: Leisure education Reason goals not met: N/A Therapeutic equipment acquired: N/A Reason patient discharged from therapy: Discharge from hospital Pt/family agrees with progress & goals achieved: Yes Date patient discharged from therapy: 07/20/17   Shandricka Monroy 07/20/2017, 4:20 PM

## 2017-07-20 NOTE — Progress Notes (Signed)
Patient discharged home. DC instructions provided and explained. Medications reviewed. Rx given. All questions answered. Denies SI, HI, AVH. Belongings returned. AVS, transition, and suicide risk assessment given to patient. Patient stable at discharge.

## 2017-07-20 NOTE — Progress Notes (Signed)
D: Pt is alert and oriented x 3 with periods of confusion to situation. Patient denies SI/HI/AVH, affect is blunted, he is mostly labile, noticed patient shuffling down the hall and he stated " my leg hurts" Patient is cooperative with treatment, he appears less anxious and he is interacting with peers and staff appropriately.  A: Pt was offered support and encouragement, given medication and  encouraged to attend groups. 15 minute checks were done for safety.  R:Pt attends groups and interacts well with peers and staff. Pt is complaint with medication.  and receptive to treatment, safety maintained on unit will continue to monitor.

## 2017-07-20 NOTE — Progress Notes (Signed)
  [facility]  Will you be returning to the same living situation after discharge:  Yes,    At discharge, do you have transportation home?: Yes,   [facility] BUS Do you have the ability to pay for your medications: Yes,   given supply as he won't be paid until 12/1  Release of information consent forms completed and in the chart;  Patient's signature needed at discharge.  Patient to Follow up at: Follow-up Information    Lynett FishSu, [name], MD Follow up.   Specialty:  Psychiatry Why:  Please attend your follow up appointment with [name] on Monday, 08/15/17, at 9:00am.  Please bring a copy of your hospital discharge paperwork. Contact information: [address] [place] [address] [phone]           Next level of care provider has access to [facility] Link:no  Safety Planning and Suicide Prevention discussed: Yes,     Have you used any form of tobacco in the last 30 days? (Cigarettes, Smokeless Tobacco, Cigars, and/or Pipes): Yes  Has patient been referred to the Quitline?: Patient refused referral  Patient has been referred for addiction treatment: Yes  Glennon MacSara P Shantia Sanford, LCSW 07/20/2017, 3:14 PM

## 2017-07-20 NOTE — [facility] Suicide Risk Assessment (Signed)
[facility]   Principal Problem: Severe recurrent major depression [facility](HCC) Discharge Diagnoses:  Patient Active Problem List   Diagnosis Date Noted  . Severe recurrent major depression (HCC) [F33.2] 07/16/2017  . Major depressive disorder, recurrent episode, moderate (HCC) [F33.1] 08/13/2015  . Opioid use disorder, severe, dependence (HCC) per history [F11.20] 08/13/2015  . Benzodiazepine abuse (HCC) [F13.10] 08/13/2015  . Tobacco use disorder [F17.200] 08/13/2015  . Hepatitis C reactive [R76.8] 01/22/2015  . PTSD (post-traumatic stress disorder) [F43.10] 01/20/2015  . 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 Chronic pain [G89.29] 10/17/2011  . Failed vision screen 05/12/2011    Total Time spent with patient: 20 minutes  Musculoskeletal: Strength & Muscle Tone: within normal limits Gait & Station: normal Patient leans: N/A  Psychiatric Specialty Exam: ROS  Blood pressure 96/79, pulse 82, temperature 97.7 F (36.5 C), temperature source Oral, resp. rate 18, height 5\' 11"  (1.803 m), weight 83.9 kg (185 lb), SpO2 100 %.Body mass index is 25.8 kg/m.   Mental Status Per Nursing Assessment::   On Admission:     Demographic Factors:  Male and Unemployed  Loss Factors: Decrease in vocational status  Historical Factors: Impulsivity  Risk Reduction Factors:   Positive therapeutic relationship and Positive coping skills or problem solving skills  Continued Clinical Symptoms:  Depression:   Anhedonia  Cognitive Features That Contribute To Risk:  None    Suicide Risk:  Minimal: No identifiable suicidal ideation.  Patients presenting with no risk factors but with morbid ruminations; may be classified as minimal risk based on the severity of the depressive symptoms  Follow-up Information    Cole [name], [name], MD Follow up.   Specialty:   Psychiatry Why:  Please attend your follow up appointment with [name] on Monday, 08/15/17, at 9:00am.  Please bring a copy of your hospital discharge paperwork. Contact information: [address] [place] [address] [phone]           Plan Of Care/Follow-up recommendations:  Follow up with [name].  Haskell RilingHolly R Keiarra Charon, MD 07/20/2017, 12:27 PM
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
## 2017-07-20 NOTE — Progress Notes (Signed)
Recreation Therapy Notes   Date: 11.21.18  Time: 1:00pm  Location: Craft Room  Behavioral response: Disengaged  Intervention Topic: Leisure  Discussion/Intervention: Group content today was focused on leisure. The group defined what leisure is and some positive leisure activities they participate in. Individuals identified the difference between good and bad leisure. Participants expressed how they feel after participating in the leisure of their choice. The group discussed how they go about picking a leisure activity and if others are involved in their leisure activities. The patient stated how many leisure activities they too choose from and reasons why it is important to have leisure time. Individuals participated in the intervention "Leisure Jeopardy" where they had a chance to identify new leisure activities as well as benefits of leisure. Clinical Observations/Feedback:  Patient came to group late for unknown reasons. He was disengaged during group and made no eye contact with peers or staff. Individual was not social with peers or staff during group.  Yudit Modesitt LRT/CTRS         Kateryna Grantham 07/20/2017 2:10 PM

## 2017-07-25 ENCOUNTER — Emergency Department (HOSPITAL_COMMUNITY)
Admission: EM | Admit: 2017-07-25 | Discharge: 2017-07-25 | Disposition: A | Discharge status: Home / Self Care | Payer: Medicaid Other | Attending: Emergency Medicine | Admitting: Emergency Medicine

## 2017-07-25 ENCOUNTER — Telehealth: Payer: Self-pay | Admitting: Orthopedic Surgery

## 2017-07-25 ENCOUNTER — Other Ambulatory Visit: Payer: Self-pay

## 2017-07-25 ENCOUNTER — Encounter (HOSPITAL_COMMUNITY): Payer: Self-pay | Admitting: *Deleted

## 2017-07-25 DIAGNOSIS — K0889 Other specified disorders of teeth and supporting structures: Secondary | ICD-10-CM | POA: Diagnosis present

## 2017-07-25 DIAGNOSIS — Z5321 Procedure and treatment not carried out due to patient leaving prior to being seen by health care provider: Secondary | ICD-10-CM | POA: Insufficient documentation

## 2017-07-25 NOTE — ED Notes (Signed)
Called x1, no answer

## 2017-07-25 NOTE — ED Notes (Signed)
Called x2, no answer 

## 2017-07-25 NOTE — ED Triage Notes (Signed)
Pt reports pain to a tooth in the upper right side of his mouth. Pt is also c/o pain to an old injury to his right hand and states he feels like he has a bump in his head.

## 2017-07-25 NOTE — ED Notes (Signed)
Called x 3, no answer.  
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
## 2017-07-26 NOTE — Progress Notes (Signed)
This encounter was created in error - please disregard.

## 2017-08-11 ENCOUNTER — Telehealth: Payer: Self-pay

## 2017-08-11 NOTE — Telephone Encounter (Signed)
Pt is on a list of Top ED visitors. Pt was called to the number provided [phone].My title and reason for my call was given to the patients grandmother who answered the call. Pt grandmother, Verdon CumminsOla, answered the phone stating the pt was not home at this time. Tried to leave some contact information for patient to return call but the grandmother stated she did not have a pencil or paper to write down the number. Told pt's grandmother I will give the patient another call some time next week.    Citlalic Norlander R. Misao Fackrell LPN [phone]

## 2017-08-20 ENCOUNTER — Other Ambulatory Visit: Payer: Self-pay

## 2017-08-20 ENCOUNTER — Emergency Department (HOSPITAL_COMMUNITY)
Admission: EM | Admit: 2017-08-20 | Discharge: 2017-08-20 | Disposition: A | Discharge status: Home / Self Care | Payer: Medicaid Other | Attending: Emergency Medicine | Admitting: Emergency Medicine

## 2017-08-20 ENCOUNTER — Encounter (HOSPITAL_COMMUNITY): Payer: Self-pay | Admitting: Emergency Medicine

## 2017-08-20 DIAGNOSIS — M79641 Pain in right hand: Secondary | ICD-10-CM | POA: Insufficient documentation

## 2017-08-20 DIAGNOSIS — J45909 Unspecified asthma, uncomplicated: Secondary | ICD-10-CM | POA: Insufficient documentation

## 2017-08-20 DIAGNOSIS — Z79899 Other long term (current) drug therapy: Secondary | ICD-10-CM | POA: Insufficient documentation

## 2017-08-20 DIAGNOSIS — F17210 Nicotine dependence, cigarettes, uncomplicated: Secondary | ICD-10-CM | POA: Diagnosis not present

## 2017-08-20 NOTE — ED Triage Notes (Signed)
Pt c/o of right hand pain. Pt states he fractured his right hand pain a month ago but never saw anyone for it. No new injury.
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
## 2017-08-20 NOTE — Discharge Instructions (Signed)
Please read attached information. If you experience any new or worsening signs or symptoms please return to the emergency room for evaluation. Please follow-up with your primary care provider or specialist as discussed.  °

## 2017-08-24 ENCOUNTER — Encounter (HOSPITAL_COMMUNITY): Payer: Self-pay | Admitting: Occupational Therapy

## 2017-08-24 NOTE — Therapy (Signed)
[place], [place], [address] Phone: [phone]   Fax:  [phone]  Patient Details  Name: Cole [name] MRN: [id number] Date of Birth: 1981/02/17 Referring Provider:  No ref. provider found  Encounter Date: 08/24/2017   OCCUPATIONAL THERAPY DISCHARGE SUMMARY  Visits from Start of Care: 1  Current functional level related to goals / functional outcomes: Unknown. Pt did not attend therapy visits and has had additional injuries since evaluation.    Remaining deficits: Unknown.    Education / Equipment: Pt educated on HEP at evaluation.   Plan: Patient agrees to discharge.  Patient goals were not met. Patient is being discharged due to not returning since the last visit.  ?????      Guadelupe Sabin, OTR/L  [phone] 08/24/2017, 9:43 AM  [facility] [address] [place], [place], [address] Phone: [phone]   Fax:  [phone]

## 2017-09-07 ENCOUNTER — Telehealth: Payer: Self-pay

## 2017-09-07 NOTE — Telephone Encounter (Signed)
Pt is on a list of Top ED visitors. Second call attempt was made today 09/07/17 to the number provided [phone]. Pt grandmother, Verdon CumminsOla, answered the phone stating the pt was not home at this time. Pt grandmother stated she did not know when pt will be back. Left message with grandmother to tell pt to give me a call back. Told pt I would try to contact him at a later date and time.    Carsynn Bethune R. Pebble Botkin LPN [phone]

## 2017-09-12 ENCOUNTER — Other Ambulatory Visit: Payer: Self-pay

## 2017-09-12 ENCOUNTER — Emergency Department (HOSPITAL_COMMUNITY)
Admission: EM | Admit: 2017-09-12 | Discharge: 2017-09-12 | Disposition: A | Discharge status: Home / Self Care | Payer: Medicaid Other | Attending: Emergency Medicine | Admitting: Emergency Medicine

## 2017-09-12 ENCOUNTER — Encounter (HOSPITAL_COMMUNITY): Payer: Self-pay | Admitting: Emergency Medicine

## 2017-09-12 DIAGNOSIS — F17210 Nicotine dependence, cigarettes, uncomplicated: Secondary | ICD-10-CM | POA: Diagnosis not present

## 2017-09-12 DIAGNOSIS — K0889 Other specified disorders of teeth and supporting structures: Secondary | ICD-10-CM | POA: Diagnosis not present

## 2017-09-12 DIAGNOSIS — J45909 Unspecified asthma, uncomplicated: Secondary | ICD-10-CM | POA: Diagnosis not present

## 2017-09-12 DIAGNOSIS — Z79899 Other long term (current) drug therapy: Secondary | ICD-10-CM | POA: Diagnosis not present

## 2017-09-12 MED ORDER — DIPHENHYDRAMINE HCL 12.5 MG/5ML PO ELIX
12.5000 mg | ORAL_SOLUTION | Freq: Once | ORAL | Status: AC
  Administered 2017-09-12: 12.5 mg via ORAL
  Filled 2017-09-12: qty 5

## 2017-09-12 MED ORDER — TRAMADOL HCL 50 MG PO TABS
100.0000 mg | ORAL_TABLET | Freq: Once | ORAL | Status: AC
Start: 2017-09-12 — End: 2017-09-12
  Administered 2017-09-12: 100 mg via ORAL
  Filled 2017-09-12: qty 2
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
## 2017-09-12 NOTE — Discharge Instructions (Signed)
Your vital signs are within normal limits.  You have some swelling around the tooth extraction area.  This is to be expected in such a fresh wound following the procedure.  I would recommend salt water swishes 3 or 4 times daily.  Continue your antibiotic as prescribed.  Please contact your dentist first thing tomorrow to work out your pain management situation.  You were treated tonight with Ultram and Benadryl.  Both of these can cause drowsiness.  Please use caution getting around.

## 2017-09-12 NOTE — ED Triage Notes (Signed)
Pt states he had a tooth pulled on Thursday and has had gum swelling since then that has worsened, pt states he called the dentist but cannot be seen again until 09/15/17, denies fever, has taken Hydrocodone and mouth wash for pain

## 2017-09-30 NOTE — Progress Notes (Signed)
DOS 06/09/17 Exostectomy Rt great toe,  Neurolysis of nerve Rt great toe
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
## 2017-12-19 ENCOUNTER — Other Ambulatory Visit: Payer: Self-pay

## 2017-12-19 ENCOUNTER — Encounter (HOSPITAL_COMMUNITY): Payer: Self-pay | Admitting: Emergency Medicine

## 2017-12-19 ENCOUNTER — Emergency Department (HOSPITAL_COMMUNITY)
Admission: EM | Admit: 2017-12-19 | Discharge: 2017-12-20 | Disposition: A | Discharge status: Home / Self Care | Payer: Medicaid Other | Attending: Emergency Medicine | Admitting: Emergency Medicine

## 2017-12-19 DIAGNOSIS — J45909 Unspecified asthma, uncomplicated: Secondary | ICD-10-CM | POA: Diagnosis not present

## 2017-12-19 DIAGNOSIS — F17210 Nicotine dependence, cigarettes, uncomplicated: Secondary | ICD-10-CM | POA: Insufficient documentation

## 2017-12-19 DIAGNOSIS — M545 Low back pain, unspecified: Secondary | ICD-10-CM

## 2017-12-19 DIAGNOSIS — Z79899 Other long term (current) drug therapy: Secondary | ICD-10-CM | POA: Diagnosis not present

## 2017-12-19 NOTE — ED Triage Notes (Addendum)
Low back pain, pt walked to 3-4 miles today to pick up suboxone prescription .  Pt also reports seeing some "red" in his bowel movement.

## 2017-12-20 LAB — I-STAT CHEM 8, ED
BUN: 10 mg/dL (ref 6–20)
CHLORIDE: 103 mmol/L (ref 101–111)
CREATININE: 0.9 mg/dL (ref 0.61–1.24)
Calcium, Ion: 1.13 mmol/L — ABNORMAL LOW (ref 1.15–1.40)
GLUCOSE: 95 mg/dL (ref 65–99)
HEMATOCRIT: 41 % (ref 39.0–52.0)
Hemoglobin: 13.9 g/dL (ref 13.0–17.0)
POTASSIUM: 3.5 mmol/L (ref 3.5–5.1)
Sodium: 142 mmol/L (ref 135–145)
TCO2: 26 mmol/L (ref 22–32)

## 2017-12-20 LAB — URINALYSIS, ROUTINE W REFLEX MICROSCOPIC
Bilirubin Urine: NEGATIVE
GLUCOSE, UA: NEGATIVE mg/dL
Hgb urine dipstick: NEGATIVE
Ketones, ur: NEGATIVE mg/dL
LEUKOCYTES UA: NEGATIVE
Nitrite: NEGATIVE
PH: 6 (ref 5.0–8.0)
Protein, ur: NEGATIVE mg/dL
SPECIFIC GRAVITY, URINE: 1.016 (ref 1.005–1.030)

## 2017-12-20 MED ORDER — KETOROLAC TROMETHAMINE 30 MG/ML IJ SOLN
30.0000 mg | Freq: Once | INTRAMUSCULAR | Status: AC
  Administered 2017-12-20: 30 mg via INTRAMUSCULAR
  Filled 2017-12-20: qty 1
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
## 2017-12-20 NOTE — Discharge Instructions (Addendum)
You were seen today for back pain.  You need to follow-up with your primary physician or pain specialist for ongoing pain medications.  Your lab work does not show any evidence of significant anemia or low red blood cells.

## 2018-01-21 ENCOUNTER — Emergency Department (HOSPITAL_COMMUNITY)
Admission: EM | Admit: 2018-01-21 | Discharge: 2018-01-21 | Disposition: A | Discharge status: Home / Self Care | Payer: Medicaid Other | Attending: Emergency Medicine | Admitting: Emergency Medicine

## 2018-01-21 ENCOUNTER — Emergency Department (HOSPITAL_COMMUNITY): Payer: Medicaid Other

## 2018-01-21 ENCOUNTER — Encounter (HOSPITAL_COMMUNITY): Payer: Self-pay | Admitting: Emergency Medicine

## 2018-01-21 ENCOUNTER — Other Ambulatory Visit: Payer: Self-pay

## 2018-01-21 DIAGNOSIS — G8929 Other chronic pain: Secondary | ICD-10-CM | POA: Insufficient documentation

## 2018-01-21 DIAGNOSIS — S93602A Unspecified sprain of left foot, initial encounter: Secondary | ICD-10-CM | POA: Diagnosis not present

## 2018-01-21 DIAGNOSIS — Y929 Unspecified place or not applicable: Secondary | ICD-10-CM | POA: Diagnosis not present

## 2018-01-21 DIAGNOSIS — X58XXXA Exposure to other specified factors, initial encounter: Secondary | ICD-10-CM | POA: Diagnosis not present

## 2018-01-21 DIAGNOSIS — Z79899 Other long term (current) drug therapy: Secondary | ICD-10-CM | POA: Insufficient documentation

## 2018-01-21 DIAGNOSIS — Y999 Unspecified external cause status: Secondary | ICD-10-CM | POA: Insufficient documentation

## 2018-01-21 DIAGNOSIS — F17210 Nicotine dependence, cigarettes, uncomplicated: Secondary | ICD-10-CM | POA: Diagnosis not present

## 2018-01-21 DIAGNOSIS — Y9389 Activity, other specified: Secondary | ICD-10-CM | POA: Diagnosis not present

## 2018-01-21 DIAGNOSIS — S83421A Sprain of lateral collateral ligament of right knee, initial encounter: Secondary | ICD-10-CM

## 2018-01-21 DIAGNOSIS — S99922A Unspecified injury of left foot, initial encounter: Secondary | ICD-10-CM | POA: Diagnosis present
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
## 2018-01-21 NOTE — Discharge Instructions (Addendum)
Minimize walking until your pain and symptoms improve. They need to wear Ace wrap on your foot, or a knee sleeve until your symptoms have improved. Your allergy to anti-inflammatories, you can use ice and Tylenol. Contact Dr. Romeo Apple, orthopedic physician, if your symptoms are not improving within the next week.

## 2018-01-25 ENCOUNTER — Telehealth: Payer: Self-pay | Admitting: Orthopaedic Surgery

## 2018-01-25 NOTE — Telephone Encounter (Signed)
Patient called, states would like to make an appointment - I asked for him to repeat his name, and for his date of birth, and asked if I can place him on a brief hold, and he hung up. (Patient seen previously at our clinic and referred out per chart notes)
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
## 2019-02-01 ENCOUNTER — Encounter: Payer: Self-pay | Admitting: Emergency Medicine

## 2019-02-01 ENCOUNTER — Other Ambulatory Visit: Payer: Self-pay

## 2019-02-01 ENCOUNTER — Emergency Department
Admission: EM | Admit: 2019-02-01 | Discharge: 2019-02-01 | Disposition: A | Discharge status: Home / Self Care | Payer: Medicaid Other | Attending: Emergency Medicine | Admitting: Emergency Medicine

## 2019-02-01 DIAGNOSIS — J45909 Unspecified asthma, uncomplicated: Secondary | ICD-10-CM | POA: Insufficient documentation

## 2019-02-01 DIAGNOSIS — Z79899 Other long term (current) drug therapy: Secondary | ICD-10-CM | POA: Diagnosis not present

## 2019-02-01 DIAGNOSIS — R21 Rash and other nonspecific skin eruption: Secondary | ICD-10-CM | POA: Diagnosis present

## 2019-02-01 DIAGNOSIS — M545 Low back pain, unspecified: Secondary | ICD-10-CM

## 2019-02-01 DIAGNOSIS — F17210 Nicotine dependence, cigarettes, uncomplicated: Secondary | ICD-10-CM | POA: Diagnosis not present

## 2019-02-01 DIAGNOSIS — Z590 Homelessness: Secondary | ICD-10-CM | POA: Diagnosis not present

## 2019-02-01 DIAGNOSIS — B356 Tinea cruris: Secondary | ICD-10-CM | POA: Diagnosis not present

## 2019-02-01 MED ORDER — IBUPROFEN 600 MG PO TABS
ORAL_TABLET | ORAL | Status: AC
  Administered 2019-02-01: 11:00:00
  Filled 2019-02-01: qty 1

## 2019-02-01 NOTE — ED Triage Notes (Signed)
Pt states rash to arms and groin per pt, has been incarcerated and wants to make sure it's okay. Rash does not itch.

## 2019-02-01 NOTE — ED Notes (Signed)
Pt verbalizes to nurse and provider no allergies to food or medicine.
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
## 2019-02-01 NOTE — ED Notes (Signed)
Pt given graham crackers, coke, peanut butter, scrubs pants and shirt, and bus pass.

## 2019-02-01 NOTE — ED Notes (Signed)
No signature d/t downtime.  Pt has no further questions about instructions, follow up, or prescriptions.

## 2019-02-02 ENCOUNTER — Encounter: Payer: Self-pay | Admitting: *Deleted

## 2019-02-02 ENCOUNTER — Emergency Department
Admission: EM | Admit: 2019-02-02 | Discharge: 2019-02-02 | Disposition: A | Discharge status: Home / Self Care | Payer: Medicaid Other | Attending: Emergency Medicine | Admitting: Emergency Medicine

## 2019-02-02 ENCOUNTER — Other Ambulatory Visit: Payer: Self-pay

## 2019-02-02 DIAGNOSIS — J45909 Unspecified asthma, uncomplicated: Secondary | ICD-10-CM | POA: Insufficient documentation

## 2019-02-02 DIAGNOSIS — B356 Tinea cruris: Secondary | ICD-10-CM | POA: Diagnosis not present

## 2019-02-02 DIAGNOSIS — Z590 Homelessness: Secondary | ICD-10-CM | POA: Insufficient documentation

## 2019-02-02 DIAGNOSIS — F17210 Nicotine dependence, cigarettes, uncomplicated: Secondary | ICD-10-CM | POA: Insufficient documentation

## 2019-02-02 DIAGNOSIS — Z1159 Encounter for screening for other viral diseases: Secondary | ICD-10-CM | POA: Insufficient documentation

## 2019-02-02 DIAGNOSIS — Z79899 Other long term (current) drug therapy: Secondary | ICD-10-CM | POA: Insufficient documentation

## 2019-02-02 DIAGNOSIS — R21 Rash and other nonspecific skin eruption: Secondary | ICD-10-CM | POA: Diagnosis present

## 2019-02-02 LAB — URINALYSIS, COMPLETE (UACMP) WITH MICROSCOPIC
Bacteria, UA: NONE SEEN
Bilirubin Urine: NEGATIVE
Glucose, UA: NEGATIVE mg/dL
Hgb urine dipstick: NEGATIVE
Ketones, ur: 5 mg/dL — AB
Leukocytes,Ua: NEGATIVE
Nitrite: NEGATIVE
Protein, ur: 30 mg/dL — AB
Specific Gravity, Urine: 1.021 (ref 1.005–1.030)
pH: 5 (ref 5.0–8.0)

## 2019-02-02 LAB — SARS CORONAVIRUS 2 BY RT PCR (HOSPITAL ORDER, PERFORMED IN ~~LOC~~ HOSPITAL LAB): SARS Coronavirus 2: NEGATIVE

## 2019-02-02 LAB — CHLAMYDIA/NGC RT PCR (ARMC ONLY)??????????

## 2019-02-02 LAB — CHLAMYDIA/NGC RT PCR (ARMC ONLY)
Chlamydia Tr: NOT DETECTED
N gonorrhoeae: NOT DETECTED

## 2019-02-02 MED ORDER — NYSTATIN 100000 UNIT/GM EX POWD
Freq: Once | CUTANEOUS | Status: AC
  Administered 2019-02-02: 06:00:00 via TOPICAL
  Filled 2019-02-02: qty 15

## 2019-02-02 NOTE — Discharge Instructions (Signed)
Apply antifungal powder as directed.  Return to the ER for worsening symptoms, persistent vomiting, difficulty breathing or other concerns.

## 2019-02-02 NOTE — ED Notes (Signed)
Pt reports he was recently released from jail and is concerned about an irritation to his groin area; denies any penile discharge; denies any accomp symptoms; pt seen yesterday for same

## 2019-02-02 NOTE — ED Triage Notes (Signed)
Pt brought in via ems.  Pt was seen here recently for rash to groin area and did not get rx filled.  Pt requesting lab test tonight and to be checked for covid.  Pt denies any sx.  Pt alert.
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
## 2020-04-19 ENCOUNTER — Other Ambulatory Visit: Payer: Self-pay

## 2020-04-19 ENCOUNTER — Encounter (HOSPITAL_COMMUNITY): Payer: Self-pay | Admitting: Emergency Medicine

## 2020-04-19 ENCOUNTER — Emergency Department (HOSPITAL_COMMUNITY)
Admission: EM | Admit: 2020-04-19 | Discharge: 2020-04-19 | Disposition: A | Discharge status: Home / Self Care | Payer: Medicaid Other | Attending: Emergency Medicine | Admitting: Emergency Medicine

## 2020-04-19 DIAGNOSIS — M545 Low back pain, unspecified: Secondary | ICD-10-CM

## 2020-04-19 DIAGNOSIS — Y9289 Other specified places as the place of occurrence of the external cause: Secondary | ICD-10-CM | POA: Insufficient documentation

## 2020-04-19 DIAGNOSIS — F17210 Nicotine dependence, cigarettes, uncomplicated: Secondary | ICD-10-CM | POA: Diagnosis not present

## 2020-04-19 DIAGNOSIS — Y9389 Activity, other specified: Secondary | ICD-10-CM | POA: Diagnosis not present

## 2020-04-19 DIAGNOSIS — Y998 Other external cause status: Secondary | ICD-10-CM | POA: Insufficient documentation

## 2020-04-19 DIAGNOSIS — J45909 Unspecified asthma, uncomplicated: Secondary | ICD-10-CM | POA: Diagnosis not present

## 2020-04-19 MED ORDER — CYCLOBENZAPRINE HCL 10 MG PO TABS
5.0000 mg | ORAL_TABLET | Freq: Once | ORAL | Status: AC
  Administered 2020-04-19: 5 mg via ORAL
  Filled 2020-04-19: qty 1

## 2020-04-19 MED ORDER — PREDNISONE 20 MG PO TABS
40.0000 mg | ORAL_TABLET | Freq: Once | ORAL | Status: AC
  Administered 2020-04-19: 40 mg via ORAL
  Filled 2020-04-19: qty 2

## 2020-04-19 MED ORDER — PREDNISONE 10 MG PO TABS: ORAL_TABLET | ORAL | 0 refills | Status: DC

## 2020-04-19 MED ORDER — CYCLOBENZAPRINE HCL 5 MG PO TABS: 5.0000 mg | ORAL_TABLET | Freq: Three times a day (TID) | ORAL | 0 refills | Status: DC | PRN

## 2020-04-19 NOTE — Discharge Instructions (Signed)
Ice packs to the injured or sore muscles for the next several days then start using heat. Take the medications for pain and muscle spasms. Return to the ED for any problems listed on the head injury sheet. Recheck if you aren't improving in the next week.  You can be rechecked by Dr. Romeo Apple, the orthopedist on-call.  Call his office to get an appointment.
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
## 2020-05-13 ENCOUNTER — Other Ambulatory Visit: Payer: Self-pay

## 2020-05-13 ENCOUNTER — Encounter (HOSPITAL_COMMUNITY): Payer: Self-pay

## 2020-05-13 ENCOUNTER — Emergency Department (HOSPITAL_COMMUNITY)
Admission: EM | Admit: 2020-05-13 | Discharge: 2020-05-13 | Disposition: A | Discharge status: Home / Self Care | Payer: Medicaid Other | Attending: Emergency Medicine | Admitting: Emergency Medicine

## 2020-05-13 DIAGNOSIS — F331 Major depressive disorder, recurrent, moderate: Secondary | ICD-10-CM | POA: Diagnosis not present

## 2020-05-13 DIAGNOSIS — F4310 Post-traumatic stress disorder, unspecified: Secondary | ICD-10-CM | POA: Diagnosis not present

## 2020-05-13 DIAGNOSIS — K752 Nonspecific reactive hepatitis: Secondary | ICD-10-CM | POA: Diagnosis not present

## 2020-05-13 DIAGNOSIS — F1310 Sedative, hypnotic or anxiolytic abuse, uncomplicated: Secondary | ICD-10-CM | POA: Insufficient documentation

## 2020-05-13 DIAGNOSIS — F17210 Nicotine dependence, cigarettes, uncomplicated: Secondary | ICD-10-CM | POA: Insufficient documentation

## 2020-05-13 DIAGNOSIS — F1120 Opioid dependence, uncomplicated: Secondary | ICD-10-CM | POA: Diagnosis not present

## 2020-05-13 DIAGNOSIS — I10 Essential (primary) hypertension: Secondary | ICD-10-CM | POA: Diagnosis not present

## 2020-05-13 DIAGNOSIS — Z79899 Other long term (current) drug therapy: Secondary | ICD-10-CM | POA: Diagnosis not present

## 2020-05-13 DIAGNOSIS — G8929 Other chronic pain: Secondary | ICD-10-CM | POA: Diagnosis not present

## 2020-05-13 DIAGNOSIS — L02411 Cutaneous abscess of right axilla: Secondary | ICD-10-CM | POA: Insufficient documentation

## 2020-05-13 DIAGNOSIS — J45909 Unspecified asthma, uncomplicated: Secondary | ICD-10-CM | POA: Insufficient documentation

## 2020-05-13 MED ORDER — LIDOCAINE HCL (PF) 1 % IJ SOLN: 5.0000 mL | Freq: Once | INTRAMUSCULAR | Status: AC

## 2020-05-13 MED ORDER — SULFAMETHOXAZOLE-TRIMETHOPRIM 800-160 MG PO TABS: 1.0000 | ORAL_TABLET | Freq: Two times a day (BID) | ORAL | 0 refills | Status: DC

## 2020-05-13 MED ORDER — LIDOCAINE 5 % EX PTCH: 1.0000 | MEDICATED_PATCH | Freq: Two times a day (BID) | CUTANEOUS | 0 refills | Status: AC

## 2020-05-13 MED ORDER — METOPROLOL TARTRATE 50 MG PO TABS: 50.0000 mg | ORAL_TABLET | Freq: Every day | ORAL | 1 refills | Status: AC

## 2020-05-13 MED ORDER — LIDOCAINE HCL (PF) 1 % IJ SOLN
INTRAMUSCULAR | Status: AC
  Administered 2020-05-13: 5 mL
  Filled 2020-05-13: qty 5

## 2020-05-13 NOTE — ED Triage Notes (Signed)
Pt reports abscess under r arm x 2 or 3 days.
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
## 2020-05-13 NOTE — Discharge Instructions (Signed)
Schedule appointment for recheck of your blood pressure

## 2020-05-29 ENCOUNTER — Ambulatory Visit: Payer: Medicaid Other | Admitting: Orthopaedic Surgery

## 2020-06-12 ENCOUNTER — Emergency Department (HOSPITAL_COMMUNITY): Payer: Medicaid Other

## 2020-06-12 ENCOUNTER — Encounter (HOSPITAL_COMMUNITY): Payer: Self-pay | Admitting: Emergency Medicine

## 2020-06-12 ENCOUNTER — Emergency Department (HOSPITAL_COMMUNITY)
Admission: EM | Admit: 2020-06-12 | Discharge: 2020-06-12 | Disposition: A | Discharge status: Home / Self Care | Payer: Medicaid Other | Attending: Emergency Medicine | Admitting: Emergency Medicine

## 2020-06-12 ENCOUNTER — Other Ambulatory Visit: Payer: Self-pay

## 2020-06-12 DIAGNOSIS — Y9389 Activity, other specified: Secondary | ICD-10-CM | POA: Diagnosis not present

## 2020-06-12 DIAGNOSIS — Y999 Unspecified external cause status: Secondary | ICD-10-CM | POA: Insufficient documentation

## 2020-06-12 DIAGNOSIS — M549 Dorsalgia, unspecified: Secondary | ICD-10-CM | POA: Diagnosis not present

## 2020-06-12 DIAGNOSIS — J45909 Unspecified asthma, uncomplicated: Secondary | ICD-10-CM | POA: Insufficient documentation

## 2020-06-12 DIAGNOSIS — F17210 Nicotine dependence, cigarettes, uncomplicated: Secondary | ICD-10-CM | POA: Insufficient documentation

## 2020-06-12 DIAGNOSIS — Y92410 Unspecified street and highway as the place of occurrence of the external cause: Secondary | ICD-10-CM | POA: Insufficient documentation

## 2020-06-12 DIAGNOSIS — Z79899 Other long term (current) drug therapy: Secondary | ICD-10-CM | POA: Diagnosis not present

## 2020-06-12 MED ORDER — METHOCARBAMOL 750 MG PO TABS: 750.0000 mg | ORAL_TABLET | Freq: Every evening | ORAL | 0 refills | Status: AC | PRN
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
## 2020-06-12 NOTE — Discharge Instructions (Addendum)
You may alternate taking Tylenol as needed for pain control. You may take [id number] mg of Tylenol every 6 hours. Do not exceed 4000 mg of Tylenol daily as this can lead to liver damage. You may use warm and cold compresses to help with your symptoms.   You were given a prescription for Robaxin which is a muscle relaxer.  You should not drive, work, or operate machinery while taking this medication as it can make you very drowsy.    Please follow up with your primary doctor within the next 7-10 days for re-evaluation and further treatment of your symptoms.   Please return to the ER sooner if you have any new or worsening symptoms.

## 2020-07-04 ENCOUNTER — Encounter (HOSPITAL_COMMUNITY): Payer: Self-pay

## 2020-07-04 ENCOUNTER — Other Ambulatory Visit: Payer: Self-pay

## 2020-07-04 ENCOUNTER — Emergency Department (HOSPITAL_COMMUNITY)
Admission: EM | Admit: 2020-07-04 | Discharge: 2020-07-04 | Disposition: A | Discharge status: Home / Self Care | Payer: Medicaid Other | Attending: Emergency Medicine | Admitting: Emergency Medicine

## 2020-07-04 DIAGNOSIS — Z79899 Other long term (current) drug therapy: Secondary | ICD-10-CM | POA: Diagnosis not present

## 2020-07-04 DIAGNOSIS — M21611 Bunion of right foot: Secondary | ICD-10-CM | POA: Diagnosis not present

## 2020-07-04 DIAGNOSIS — J45909 Unspecified asthma, uncomplicated: Secondary | ICD-10-CM | POA: Diagnosis not present

## 2020-07-04 DIAGNOSIS — F17210 Nicotine dependence, cigarettes, uncomplicated: Secondary | ICD-10-CM | POA: Diagnosis not present

## 2020-07-04 DIAGNOSIS — M79674 Pain in right toe(s): Secondary | ICD-10-CM | POA: Diagnosis present

## 2020-07-04 MED ORDER — METHOCARBAMOL 500 MG PO TABS: 500.0000 mg | ORAL_TABLET | Freq: Two times a day (BID) | ORAL | 0 refills | Status: DC

## 2020-07-04 MED ORDER — METHOCARBAMOL 500 MG PO TABS
500.0000 mg | ORAL_TABLET | Freq: Once | ORAL | Status: AC
  Administered 2020-07-04: 500 mg via ORAL
  Filled 2020-07-04: qty 1

## 2020-07-04 NOTE — ED Triage Notes (Signed)
Pt presents to ED with bunion on big right toe he wants to get checked out, states its been there for "a while"
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
## 2020-07-04 NOTE — Discharge Instructions (Signed)
Follow up with the foot specialist listed below. You can try the following to help with pain: Wearing shoes that have a wide toe box. Using bunion pads to cushion the affected area. Taping your toes together to keep them in a normal position. Placing a device inside your shoe (orthotics) to help reduce pressure on your toe joint. Taking medicine to ease pain, inflammation, and swelling. Applying heat or ice to the affected area. Doing stretching exercises. Surgery to remove scar tissue and move the toes back into their normal position. This treatment is rare.  Return to the ED for injuries, fall, numbness, redness or swelling of joint.

## 2020-09-08 ENCOUNTER — Emergency Department (HOSPITAL_COMMUNITY)
Admission: EM | Admit: 2020-09-08 | Discharge: 2020-09-08 | Disposition: A | Discharge status: Home / Self Care | Payer: Medicaid Other

## 2020-11-07 ENCOUNTER — Encounter (HOSPITAL_COMMUNITY): Payer: Self-pay

## 2020-11-07 ENCOUNTER — Other Ambulatory Visit: Payer: Self-pay

## 2020-11-07 ENCOUNTER — Emergency Department (HOSPITAL_COMMUNITY)
Admission: EM | Admit: 2020-11-07 | Discharge: 2020-11-07 | Disposition: A | Discharge status: Home / Self Care | Payer: Medicaid Other | Source: Home / Self Care | Attending: Emergency Medicine | Admitting: Emergency Medicine

## 2020-11-07 ENCOUNTER — Emergency Department (HOSPITAL_COMMUNITY)
Admission: EM | Admit: 2020-11-07 | Discharge: 2020-11-07 | Disposition: A | Discharge status: Home / Self Care | Payer: Medicaid Other | Attending: Emergency Medicine | Admitting: Emergency Medicine

## 2020-11-07 DIAGNOSIS — J45909 Unspecified asthma, uncomplicated: Secondary | ICD-10-CM | POA: Insufficient documentation

## 2020-11-07 DIAGNOSIS — R238 Other skin changes: Secondary | ICD-10-CM | POA: Insufficient documentation

## 2020-11-07 DIAGNOSIS — F17210 Nicotine dependence, cigarettes, uncomplicated: Secondary | ICD-10-CM | POA: Insufficient documentation

## 2020-11-07 DIAGNOSIS — Z79899 Other long term (current) drug therapy: Secondary | ICD-10-CM | POA: Insufficient documentation

## 2020-11-07 DIAGNOSIS — K0889 Other specified disorders of teeth and supporting structures: Secondary | ICD-10-CM | POA: Diagnosis not present

## 2020-11-07 DIAGNOSIS — R239 Unspecified skin changes: Secondary | ICD-10-CM

## 2020-11-07 MED ORDER — PENICILLIN V POTASSIUM 500 MG PO TABS
500.0000 mg | ORAL_TABLET | Freq: Three times a day (TID) | ORAL | 0 refills | Status: AC
Start: 2020-11-07 — End: 2020-11-14

## 2020-11-07 NOTE — ED Triage Notes (Signed)
Pt discharged, walked to lobby and checked back in for "a bump under arm"
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
## 2020-11-07 NOTE — ED Notes (Signed)
Pt left without V/S or discharge instructions

## 2020-11-07 NOTE — ED Triage Notes (Signed)
Pt reports right upper dental pain with intermittent swelling, pt concerned for infection, no significant swelling noted at this time. Pt has an appointment on the 28th with a dentist.

## 2020-11-07 NOTE — Discharge Instructions (Signed)
Call your primary care doctor or specialist as discussed in the next 2-3 days.   Return immediately back to the ER if:  Your symptoms worsen within the next 12-24 hours. You develop new symptoms such as new fevers, persistent vomiting, new pain, shortness of breath, or new weakness or numbness, or if you have any other concerns.  

## 2021-01-29 ENCOUNTER — Other Ambulatory Visit: Payer: Self-pay

## 2021-01-29 ENCOUNTER — Emergency Department (HOSPITAL_COMMUNITY)
Admission: EM | Admit: 2021-01-29 | Discharge: 2021-01-29 | Discharge status: Against Medical Advice | Payer: Medicaid Other

## 2021-03-25 ENCOUNTER — Other Ambulatory Visit: Payer: Self-pay

## 2021-03-25 ENCOUNTER — Encounter (HOSPITAL_COMMUNITY): Payer: Self-pay | Admitting: *Deleted

## 2021-03-25 ENCOUNTER — Emergency Department (HOSPITAL_COMMUNITY)
Admission: EM | Admit: 2021-03-25 | Discharge: 2021-03-25 | Disposition: A | Discharge status: Home / Self Care | Payer: Medicaid Other | Attending: Emergency Medicine | Admitting: Emergency Medicine

## 2021-03-25 DIAGNOSIS — H5713 Ocular pain, bilateral: Secondary | ICD-10-CM | POA: Insufficient documentation

## 2021-03-25 DIAGNOSIS — Z5321 Procedure and treatment not carried out due to patient leaving prior to being seen by health care provider: Secondary | ICD-10-CM | POA: Insufficient documentation

## 2021-03-25 NOTE — ED Triage Notes (Signed)
Pt c/o bilateral eye pain x 2 days

## 2021-03-26 ENCOUNTER — Other Ambulatory Visit: Payer: Self-pay

## 2021-03-26 ENCOUNTER — Emergency Department (HOSPITAL_COMMUNITY)
Admission: EM | Admit: 2021-03-26 | Discharge: 2021-03-26 | Disposition: A | Discharge status: Home / Self Care | Payer: Medicaid Other | Attending: Emergency Medicine | Admitting: Emergency Medicine

## 2021-03-26 ENCOUNTER — Encounter (HOSPITAL_COMMUNITY): Payer: Self-pay | Admitting: Emergency Medicine

## 2021-03-26 DIAGNOSIS — H00012 Hordeolum externum right lower eyelid: Secondary | ICD-10-CM

## 2021-03-26 DIAGNOSIS — H5789 Other specified disorders of eye and adnexa: Secondary | ICD-10-CM | POA: Insufficient documentation

## 2021-03-26 DIAGNOSIS — J45909 Unspecified asthma, uncomplicated: Secondary | ICD-10-CM | POA: Diagnosis not present

## 2021-03-26 DIAGNOSIS — F17210 Nicotine dependence, cigarettes, uncomplicated: Secondary | ICD-10-CM | POA: Diagnosis not present

## 2021-03-26 LAB — URINALYSIS, ROUTINE W REFLEX MICROSCOPIC
Bilirubin Urine: NEGATIVE
Glucose, UA: NEGATIVE mg/dL
Hgb urine dipstick: NEGATIVE
Ketones, ur: NEGATIVE mg/dL
Leukocytes,Ua: NEGATIVE
Nitrite: NEGATIVE
Protein, ur: NEGATIVE mg/dL
Specific Gravity, Urine: 1.02 (ref 1.005–1.030)
pH: 5.5 (ref 5.0–8.0)

## 2021-03-26 MED ORDER — FLUORESCEIN SODIUM 1 MG OP STRP
1.0000 | ORAL_STRIP | Freq: Once | OPHTHALMIC | Status: AC
  Administered 2021-03-26: 1 via OPHTHALMIC
  Filled 2021-03-26: qty 1

## 2021-03-26 MED ORDER — ERYTHROMYCIN 5 MG/GM OP OINT
TOPICAL_OINTMENT | Freq: Once | OPHTHALMIC | Status: AC
  Filled 2021-03-26: qty 3.5

## 2021-03-26 MED ORDER — TETRACAINE HCL 0.5 % OP SOLN
2.0000 [drp] | Freq: Once | OPHTHALMIC | Status: AC
  Administered 2021-03-26: 2 [drp] via OPHTHALMIC
  Filled 2021-03-26 (×2): qty 4

## 2021-03-26 NOTE — Discharge Instructions (Addendum)
Apply erythromycin ophthalmic ointment 3 times daily for the next several days.  Apply warm compresses to the eye as frequently as possible for the next several days.  We will call you if your urine sample indicates you require further treatment or need to take additional action.

## 2021-03-26 NOTE — ED Triage Notes (Signed)
Pt c/o bilateral eye pain x2 days, pt here earlier for same but LWBS.
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
## 2021-03-27 LAB — GC/CHLAMYDIA PROBE AMP (~~LOC~~) NOT AT ARMC
Chlamydia: NEGATIVE
Comment: NEGATIVE
Comment: NORMAL
Neisseria Gonorrhea: NEGATIVE

## 2021-05-18 ENCOUNTER — Emergency Department (HOSPITAL_COMMUNITY)
Admission: EM | Admit: 2021-05-18 | Discharge: 2021-05-19 | Disposition: A | Discharge status: Home / Self Care | Payer: Medicaid Other | Attending: Emergency Medicine | Admitting: Emergency Medicine

## 2021-05-18 ENCOUNTER — Other Ambulatory Visit: Payer: Self-pay

## 2021-05-18 DIAGNOSIS — F17210 Nicotine dependence, cigarettes, uncomplicated: Secondary | ICD-10-CM | POA: Diagnosis not present

## 2021-05-18 DIAGNOSIS — J45909 Unspecified asthma, uncomplicated: Secondary | ICD-10-CM | POA: Diagnosis not present

## 2021-05-18 DIAGNOSIS — B353 Tinea pedis: Secondary | ICD-10-CM | POA: Diagnosis not present

## 2021-05-18 DIAGNOSIS — Z79899 Other long term (current) drug therapy: Secondary | ICD-10-CM | POA: Insufficient documentation

## 2021-05-18 DIAGNOSIS — K0889 Other specified disorders of teeth and supporting structures: Secondary | ICD-10-CM | POA: Diagnosis present

## 2021-05-18 DIAGNOSIS — F32A Depression, unspecified: Secondary | ICD-10-CM | POA: Insufficient documentation

## 2021-05-18 DIAGNOSIS — Y900 Blood alcohol level of less than 20 mg/100 ml: Secondary | ICD-10-CM | POA: Diagnosis not present

## 2021-05-18 LAB — COMPREHENSIVE METABOLIC PANEL
ALT: 19 U/L (ref 0–44)
AST: 19 U/L (ref 15–41)
Albumin: 4.2 g/dL (ref 3.5–5.0)
Alkaline Phosphatase: 50 U/L (ref 38–126)
Anion gap: 5 (ref 5–15)
BUN: 16 mg/dL (ref 6–20)
CO2: 26 mmol/L (ref 22–32)
Calcium: 8.4 mg/dL — ABNORMAL LOW (ref 8.9–10.3)
Chloride: 108 mmol/L (ref 98–111)
Creatinine, Ser: 0.86 mg/dL (ref 0.61–1.24)
GFR, Estimated: >60 mL/min (ref >60)
Glucose, Bld: 103 mg/dL — ABNORMAL HIGH (ref 70–99)
Potassium: 3.5 mmol/L (ref 3.5–5.1)
Sodium: 139 mmol/L (ref 135–145)
Total Bilirubin: 1 mg/dL (ref 0.3–1.2)
Total Protein: 7.9 g/dL (ref 6.5–8.1)

## 2021-05-18 LAB — CBC WITH DIFFERENTIAL/PLATELET
Abs Immature Granulocytes: 0.02 10*3/uL (ref 0.00–0.07)
Basophils Absolute: 0 10*3/uL (ref 0.0–0.1)
Basophils Relative: 0 %
Eosinophils Absolute: 0.1 10*3/uL (ref 0.0–0.5)
Eosinophils Relative: 1 %
HCT: 40.6 % (ref 39.0–52.0)
Hemoglobin: 12.9 g/dL — ABNORMAL LOW (ref 13.0–17.0)
Immature Granulocytes: 0 %
Lymphocytes Relative: 34 %
Lymphs Abs: 2.5 10*3/uL (ref 0.7–4.0)
MCH: 25.8 pg — ABNORMAL LOW (ref 26.0–34.0)
MCHC: 31.8 g/dL (ref 30.0–36.0)
MCV: 81.2 fL (ref 80.0–100.0)
Monocytes Absolute: 0.6 10*3/uL (ref 0.1–1.0)
Monocytes Relative: 8 %
Neutro Abs: 4.1 10*3/uL (ref 1.7–7.7)
Neutrophils Relative %: 57 %
Platelets: 185 10*3/uL (ref 150–400)
RBC: 5 MIL/uL (ref 4.22–5.81)
RDW: 13.6 % (ref 11.5–15.5)
WBC: 7.3 10*3/uL (ref 4.0–10.5)
nRBC: 0 % (ref 0.0–0.2)

## 2021-05-18 LAB — ETHANOL: Alcohol, Ethyl (B): <10 mg/dL (ref <10)

## 2021-05-18 MED ORDER — TOLNAFTATE 1 % EX CREA: 1.0000 "application " | TOPICAL_CREAM | Freq: Two times a day (BID) | CUTANEOUS | 0 refills | Status: DC
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
## 2021-05-18 NOTE — ED Notes (Signed)
Pt provided with clean socks

## 2021-05-18 NOTE — ED Notes (Signed)
Called for triage no answer  

## 2021-05-18 NOTE — ED Triage Notes (Signed)
Pt c/o bilateral toe pain for several weeks. Denies any injury. Ambulatory in triage.

## 2021-05-18 NOTE — Discharge Instructions (Addendum)
Follow-up with your therapist as soon as possible if you have ongoing thoughts of suicide.  See a dentist as soon as possible for treatment of your tooth ache.  In the meantime use Tylenol for pain.

## 2021-05-18 NOTE — ED Notes (Signed)
Called x 1 no answer

## 2021-05-19 NOTE — ED Notes (Signed)
Went to have Pt. Sign the Dispo since the other signature didn't show. Pt. Just walked out of the ED instead.

## 2021-05-19 NOTE — ED Notes (Signed)
This pt. Was smoking in the room. Pt. Has been placed in front of the nurses station.

## 2021-05-19 NOTE — BH Assessment (Addendum)
Comprehensive Clinical Assessment (CCA) Screening, Triage and Referral Note  05/19/2021 MOSS BERRY [id number]  Disposition: Dorena Bodo, NP, patient psych-cleared with resources for homeless shelters   Chief Complaint: 40 year old male reporting to [facility]-Ed with multiple complaints.  Pt has toe pain.  Pt complains of a bad tooth that he needs removed and feeling like he needs to go to behavioral health.  Pt states he needs his mental health taken care off.  Pt admits to depression.  Pt thinks he needs to be on medications.   During assessment patient reported passive suicidal ideations. Patient reports his homeless and asked for resources to assist with housing. Patient denied homicidal ideations, denied auditory/visual hallucination. Denied being on medication but reports he needs medication. Patient requested that he comes into the hospital for a few days so he can get checked out.   Chief Complaint  Patient presents with   Toe Pain   Visit Diagnosis: Depression   Patient Reported Information How did you hear about [place]? Self  What Is the Reason for Your Visit/Call Today? Patient reported issues with depression he stated, "I want to see if ya'll could see me for a couple days inpatient to evaluate what's going on." Report passive suicidal ideations pressent two days, denied homicidal ideations, and denied auditory/visual hallucinations. Report psychiatrist at [facility] (Dr. Recardo Evangelist). Report seen last week. Report hx of depression, current symptoms reported anxiety, finanical stressors, homeless, transportation,  How Long Has This Been Causing You Problems? <Week (report history of depression and passive suicidal ideations)  What Do You Feel Would Help You the Most Today? Stress Management   Have You Recently Had Any Thoughts About Hurting Yourself? No  Are You Planning to Commit Suicide/Harm Yourself At This time? No   Have you Recently Had Thoughts About Hurting  Someone [name]? No  Are You Planning to Harm Someone at This Time? No  Explanation: No data recorded  Have You Used Any Alcohol or Drugs in the Past 24 Hours? No  How Long Ago Did You Use Drugs or Alcohol? No data recorded What Did You Use and How Much? report history of cocaine last usage 2 days ago   Do You Currently Have a Therapist/Psychiatrist? Yes  Name of Therapist/Psychiatrist: Psychiatrist, [employer] [employer]   Have You Been Recently Discharged From Any Office Practice or Programs? No  Explanation of Discharge From Practice/Program: No data recorded   CCA Screening Triage Referral Assessment Type of Contact: Tele-Assessment  Telemedicine Service Delivery: Telemedicine service delivery: This service was provided via telemedicine using a 2-way, interactive audio and video technology  Is this Initial or Reassessment? Initial Assessment  Date Telepsych consult ordered in [facility]:  05/18/21  Time Telepsych consult ordered in [facility]:  2335  Location of Assessment: [facility] ED  Provider Location: [facility] Assessment Services   Collateral Involvement: n/a   Does Patient Have a Automotive engineer Guardian? No data recorded Name and Contact of Legal Guardian: No data recorded If Minor and Not Living with Parent(s), Who has Custody? No data recorded Is CPS involved or ever been involved? Never  Is APS involved or ever been involved? Never   Patient Determined To Be At Risk for Harm To Self or Others Based on Review of Patient Reported Information or Presenting Complaint? No (report passive suicidal ideations, stressors with housing (homeless), transportation, and financial)  Method: No data recorded Availability of Means: No data recorded Intent: No data recorded Notification Required: No data recorded Additional Information for  Danger to Others Potential: No data recorded Additional Comments for Danger to Others Potential: No data recorded Are There Guns or Other Weapons in  Your Home? No data recorded Types of Guns/Weapons: No data recorded Are These Weapons Safely Secured?                            No data recorded Who Could Verify You Are Able To Have These Secured: No data recorded Do You Have any Outstanding Charges, Pending Court Dates, Parole/Probation? No data recorded Contacted To Inform of Risk of Harm To Self or Others: No data recorded  Does Patient Present under Involuntary Commitment? No  IVC Papers Initial File Date: No data recorded  [place] of Residence: [place]   Patient Currently Receiving the Following Services: Medication Management   Determination of Need: Routine (7 days)   Options For Referral: Medication Management (pt need to contact his psychiatrist (reported all medication filled last week))   Discharge Disposition:     Sahiba Granholm, LCAS

## 2021-05-19 NOTE — ED Notes (Signed)
Requested urine from patient. 

## 2021-06-17 ENCOUNTER — Other Ambulatory Visit: Payer: Self-pay

## 2021-06-17 DIAGNOSIS — J45909 Unspecified asthma, uncomplicated: Secondary | ICD-10-CM | POA: Diagnosis not present

## 2021-06-17 DIAGNOSIS — M545 Low back pain, unspecified: Secondary | ICD-10-CM | POA: Diagnosis not present

## 2021-06-17 DIAGNOSIS — K0889 Other specified disorders of teeth and supporting structures: Secondary | ICD-10-CM | POA: Diagnosis not present

## 2021-06-17 DIAGNOSIS — F17210 Nicotine dependence, cigarettes, uncomplicated: Secondary | ICD-10-CM | POA: Diagnosis not present

## 2021-06-18 ENCOUNTER — Encounter (HOSPITAL_COMMUNITY): Payer: Self-pay

## 2021-06-18 ENCOUNTER — Emergency Department (HOSPITAL_COMMUNITY)
Admission: EM | Admit: 2021-06-18 | Discharge: 2021-06-18 | Disposition: A | Discharge status: Home / Self Care | Payer: Medicaid Other | Attending: Emergency Medicine | Admitting: Emergency Medicine

## 2021-06-18 DIAGNOSIS — K0889 Other specified disorders of teeth and supporting structures: Secondary | ICD-10-CM

## 2021-06-18 MED ORDER — PENICILLIN V POTASSIUM 500 MG PO TABS: 500.0000 mg | ORAL_TABLET | Freq: Four times a day (QID) | ORAL | 0 refills | Status: AC
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
## 2021-06-18 NOTE — ED Triage Notes (Addendum)
Lower back pain for a couple days. Top left tooth pain a week ago.  Said that he thinks he had a kidney stone because when he went to have a BM he noticed a silver stone in the toilet after he flushed  When asked why it was so bad tonight if this is an on going issue. He states he only came in here to charge his phone but figured he would check in to get it looked at.

## 2021-06-18 NOTE — Discharge Instructions (Addendum)
You were evaluated in the Emergency Department and after careful evaluation, we did not find any emergent condition requiring admission or further testing in the hospital.  Your exam/testing today was overall reassuring.  Please return to the Emergency Department if you experience any worsening of your condition.  Thank you for allowing us to be a part of your care.  

## 2021-07-22 ENCOUNTER — Other Ambulatory Visit: Payer: Self-pay

## 2021-07-22 ENCOUNTER — Encounter (HOSPITAL_COMMUNITY): Payer: Self-pay

## 2021-07-22 ENCOUNTER — Emergency Department (HOSPITAL_COMMUNITY)
Admission: EM | Admit: 2021-07-22 | Discharge: 2021-07-22 | Disposition: A | Discharge status: Home / Self Care | Payer: Medicaid Other | Attending: Emergency Medicine | Admitting: Emergency Medicine

## 2021-07-22 DIAGNOSIS — J45909 Unspecified asthma, uncomplicated: Secondary | ICD-10-CM | POA: Diagnosis not present

## 2021-07-22 DIAGNOSIS — F17210 Nicotine dependence, cigarettes, uncomplicated: Secondary | ICD-10-CM | POA: Diagnosis not present

## 2021-07-22 DIAGNOSIS — K029 Dental caries, unspecified: Secondary | ICD-10-CM | POA: Insufficient documentation

## 2021-07-22 DIAGNOSIS — K047 Periapical abscess without sinus: Secondary | ICD-10-CM | POA: Diagnosis not present

## 2021-07-22 DIAGNOSIS — K0889 Other specified disorders of teeth and supporting structures: Secondary | ICD-10-CM | POA: Diagnosis present

## 2021-07-22 MED ORDER — AMOXICILLIN 250 MG PO CAPS
500.0000 mg | ORAL_CAPSULE | Freq: Once | ORAL | Status: AC
  Administered 2021-07-22: 500 mg via ORAL
  Filled 2021-07-22: qty 2

## 2021-07-22 MED ORDER — AMOXICILLIN 500 MG PO CAPS: 500.0000 mg | ORAL_CAPSULE | Freq: Three times a day (TID) | ORAL | 0 refills | Status: DC

## 2021-07-22 NOTE — ED Triage Notes (Signed)
Complaints of broken tooth a couple weeks prior.

## 2021-07-22 NOTE — Discharge Instructions (Signed)
As discussed you do have a deep cavity in the tooth that is causing you pain and there may be an early infection around this tooth as well.  Take the entire course of the antibiotics prescribed.  You may also use a hydrogen peroxide (1 teaspoon) swish and spit to help keep this site cleaned out.  Plan to follow-up with dentistry as planned for definitive care at this cavity.
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
## 2021-09-22 ENCOUNTER — Emergency Department (HOSPITAL_COMMUNITY)
Admission: EM | Admit: 2021-09-22 | Discharge: 2021-09-22 | Disposition: A | Discharge status: Home / Self Care | Payer: Medicaid Other | Attending: Emergency Medicine | Admitting: Emergency Medicine

## 2021-09-22 ENCOUNTER — Encounter (HOSPITAL_COMMUNITY): Payer: Self-pay | Admitting: *Deleted

## 2021-09-22 DIAGNOSIS — K0889 Other specified disorders of teeth and supporting structures: Secondary | ICD-10-CM | POA: Diagnosis present

## 2021-09-22 DIAGNOSIS — K029 Dental caries, unspecified: Secondary | ICD-10-CM | POA: Diagnosis not present

## 2021-09-22 MED ORDER — AMOXICILLIN-POT CLAVULANATE 875-125 MG PO TABS: 1.0000 | ORAL_TABLET | Freq: Two times a day (BID) | ORAL | 0 refills | Status: DC

## 2021-09-22 MED ORDER — AMOXICILLIN-POT CLAVULANATE 875-125 MG PO TABS
1.0000 | ORAL_TABLET | Freq: Once | ORAL | Status: AC
  Administered 2021-09-22: 19:00:00 1 via ORAL
  Filled 2021-09-22: qty 1

## 2021-09-22 NOTE — ED Provider Notes (Signed)
[facility] EMERGENCY DEPARTMENT Provider Note   CSN: [id number] Arrival date & time: 09/22/21  1720     History  Chief Complaint  Patient presents with   Dental Problem    Cole [name] is a 41 y.o. male This is an overall well-appearing patient who complains of dental pain for the last 2 weeks.  Patient reports that he has known cavities on the upper left of his mouth.  Patient reports that he has a dentist appointment scheduled for June, but he has had some gum swelling, pain with swallowing, and tooth pain.  Not taking anything for pain currently.  Patient denies fever, chills, inability to swallow.  Patient denies sour taste in his mouth.  Patient has some pain with swallowing.  No abdominal pain, chest pain, shortness of breath.  Overall well-appearing patient with stable vital signs, no active fever well-appearing patient with stable vital signs, afebrile       Home Medications Prior to Admission medications   Medication Sig Start Date End Date Taking? Authorizing Provider  amoxicillin-clavulanate (AUGMENTIN) 875-125 MG tablet Take 1 tablet by mouth every 12 (twelve) hours. 09/22/21  Yes Johnica Armwood H, PA-C  ALPRAZolam (XANAX XR) 1 MG 24 hr tablet Take 1 tablet (1 mg total) by mouth daily. Patient not taking: No sig reported 07/20/17   McNew, Ileene Hutchinson, MD  ALPRAZolam Prudy Feeler) 1 MG tablet Take 1 mg by mouth 3 (three) times daily as needed for anxiety. 03/12/21   [provider]  gabapentin (NEURONTIN) 600 MG tablet Take 1 tablet (600 mg total) by mouth 3 (three) times daily. Patient not taking: Reported on 05/19/2021 07/20/17   Haskell Riling, MD  gabapentin (NEURONTIN) 800 MG tablet Take 800 mg by mouth 4 (four) times daily. 04/24/21   [provider]  haloperidol (HALDOL) 10 MG tablet Take 1 tablet (10 mg total) by mouth at bedtime. Patient taking differently: Take 10 mg by mouth at bedtime as needed (anxiety). 07/20/17   McNew, Ileene Hutchinson, MD   methocarbamol (ROBAXIN) 500 MG tablet Take 1 tablet (500 mg total) by mouth 2 (two) times daily. Patient not taking: No sig reported 07/04/20   Khatri, Hina, PA-C  predniSONE (DELTASONE) 10 MG tablet Take 2 p.o. daily x4 days then 1 p.o. daily x4 days Patient not taking: No sig reported 04/19/20   Devoria Albe, MD  SUBOXONE 4-1 MG FILM Place 1 strip under the tongue daily. 03/18/21   [provider]  tolnaftate (TINACTIN) 1 % cream Apply 1 application topically 2 (two) times daily. 05/18/21   Elson Areas, PA-C      Allergies    Aleve [naproxen sodium], Aspirin, Ibuprofen, Seroquel [quetiapine fumerate], Tramadol, and Tylenol [acetaminophen]    Review of Systems   Review of Systems  HENT:  Positive for dental problem.   All other systems reviewed and are negative.  Physical Exam Updated Vital Signs BP 134/85 (BP Location: Left Arm)    Pulse 62    Temp 98.3 F (36.8 C) (Oral)    Resp 14    SpO2 100%  Physical Exam Vitals and nursing note reviewed.  Constitutional:      General: He is not in acute distress.    Appearance: Normal appearance.  HENT:     Head: Normocephalic and atraumatic.     Mouth/Throat:     Comments: Patient with clear oropharynx.  Some halitosis noted.  Poor dentition noted especially in the upper left.  No evidence of dental  abscess, periapical abscess, peritonsillar abscess noted.  Tonsils are 1+ bilaterally.  Uvula is midline. Eyes:     General:        Right eye: No discharge.        Left eye: No discharge.  Cardiovascular:     Rate and Rhythm: Normal rate and regular rhythm.  Pulmonary:     Effort: Pulmonary effort is normal. No respiratory distress.  Musculoskeletal:        General: No deformity.     Cervical back: Neck supple. No rigidity.  Lymphadenopathy:     Cervical: No cervical adenopathy.  Skin:    General: Skin is warm and dry.  Neurological:     Mental Status: He is alert and oriented to person, place, and time.  Psychiatric:         Mood and Affect: Mood normal.        Behavior: Behavior normal.    ED Results / Procedures / Treatments   Labs (all labs ordered are listed, but only abnormal results are displayed) Labs Reviewed - No data to display  EKG None  Radiology No results found.  Procedures Procedures    Medications Ordered in ED Medications  amoxicillin-clavulanate (AUGMENTIN) 875-125 MG per tablet 1 tablet (1 tablet Oral Given 09/22/21 1908)    ED Course/ Medical Decision Making/ A&P                            Medical Decision Making Risk Prescription drug management.   Overall well appearing patient with clear vital signs, afebrile, who can swallow who presents with concern for dental pain.  A thorough exam of his oropharynx shows no concern for active abscess, he does have some gum swelling, and evidence of poor dentition.  He does not have significant reactive lymphadenopathy. This patient has a plan to follow-up with dentistry in June, but is trying to get a visit sooner than this.  Given evidence of dental caries, we will discharge with prescription for Augmentin.  Encouraged ibuprofen, Tylenol for pain control.  Encourage return if pain worsens despite treatment, patient develops fever that does not respond to ibuprofen, Tylenol, or he becomes unable to swallow.  Patient discharged in stable condition at this time, return precautions given. Final Clinical Impression(s) / ED Diagnoses Final diagnoses:  Pain due to dental caries    Rx / DC Orders ED Discharge Orders          Ordered    amoxicillin-clavulanate (AUGMENTIN) 875-125 MG tablet  Every 12 hours        09/22/21 1851              Sharvil Hoey, Edyth Gunnels 09/22/21 2132    Gerhard Munch, MD 09/22/21 [phone]

## 2021-09-22 NOTE — Discharge Instructions (Addendum)
Please use Tylenol or ibuprofen for pain.  You may use 600 mg ibuprofen every 6 hours or 1000 mg of Tylenol every 6 hours.  You may choose to alternate between the 2.  This would be most effective.  Not to exceed 4 g of Tylenol within 24 hours.  Not to exceed 3200 mg ibuprofen 24 hours.  Please take the entire course of antibiotics that we have prescribed.  You may want to take it on full stomach, with an probiotic to help with stomach upset, diarrhea.  As we discussed please follow-up with a dentist at your earliest convenience for further evaluation.

## 2021-09-22 NOTE — ED Triage Notes (Signed)
Dental pain x 2 weeks.

## 2021-11-15 ENCOUNTER — Encounter (HOSPITAL_COMMUNITY): Payer: Self-pay | Admitting: Emergency Medicine

## 2021-11-15 ENCOUNTER — Emergency Department (HOSPITAL_COMMUNITY)
Admission: EM | Admit: 2021-11-15 | Discharge: 2021-11-15 | Disposition: A | Discharge status: Home / Self Care | Payer: Medicaid Other | Attending: Emergency Medicine | Admitting: Emergency Medicine

## 2021-11-15 DIAGNOSIS — J029 Acute pharyngitis, unspecified: Secondary | ICD-10-CM | POA: Insufficient documentation

## 2021-11-15 LAB — GROUP A STREP BY PCR: Group A Strep by PCR: NOT DETECTED

## 2021-11-15 NOTE — Discharge Instructions (Addendum)
You were seen in the emergency department for sore throat.  ? ?You tested negative for strep.  I think your symptoms are related to a virus.  Antibiotics would not be helpful in this case.  We normally treat viruses with over-the-counter medications like ibuprofen or Tylenol as needed for pain or fever.  I also recommend cough drops or cough syrup, to help soothe your throat. ?

## 2021-11-15 NOTE — ED Triage Notes (Signed)
Sore throat that started today. 

## 2021-11-15 NOTE — ED Provider Notes (Signed)
?[facility] EMERGENCY DEPARTMENT ?Provider Note ? ? ?CSN: [id number] ?Arrival date & time: 11/15/21  1344 ? ?  ? ?History ? ?Chief Complaint  ?Patient presents with  ? Sore Throat  ? ? ?Cole [name] is a 41 y.o. male who presents to the emergency department for sore throat since this morning.  Patient states that he woke up with a sore throat and nasal congestion.  He has not taken anything for his symptoms.  No fevers, chills, chest pain, difficulty breathing, abdominal pain, nausea or vomiting. ? ? ?Sore Throat ?Pertinent negatives include no chest pain, no abdominal pain and no shortness of breath.  ? ?  ? ?Home Medications ?Prior to Admission medications   ?Medication Sig Start Date End Date Taking? Authorizing Provider  ?ALPRAZolam (XANAX XR) 1 MG 24 hr tablet Take 1 tablet (1 mg total) by mouth daily. ?Patient not taking: No sig reported 07/20/17   McNew, Ileene Hutchinson, MD  ?ALPRAZolam Prudy Feeler) 1 MG tablet Take 1 mg by mouth 3 (three) times daily as needed for anxiety. 03/12/21   [provider]  ?amoxicillin-clavulanate (AUGMENTIN) 875-125 MG tablet Take 1 tablet by mouth every 12 (twelve) hours. 09/22/21   Prosperi, Christian H, PA-C  ?gabapentin (NEURONTIN) 600 MG tablet Take 1 tablet (600 mg total) by mouth 3 (three) times daily. ?Patient not taking: Reported on 05/19/2021 07/20/17   Haskell Riling, MD  ?gabapentin (NEURONTIN) 800 MG tablet Take 800 mg by mouth 4 (four) times daily. 04/24/21   [provider]  ?haloperidol (HALDOL) 10 MG tablet Take 1 tablet (10 mg total) by mouth at bedtime. ?Patient taking differently: Take 10 mg by mouth at bedtime as needed (anxiety). 07/20/17   McNew, Ileene Hutchinson, MD  ?methocarbamol (ROBAXIN) 500 MG tablet Take 1 tablet (500 mg total) by mouth 2 (two) times daily. ?Patient not taking: No sig reported 07/04/20   Khatri, Hina, PA-C  ?predniSONE (DELTASONE) 10 MG tablet Take 2 p.o. daily x4 days then 1 p.o. daily x4 days ?Patient not taking: No sig reported 04/19/20    Devoria Albe, MD  ?SUBOXONE 4-1 MG FILM Place 1 strip under the tongue daily. 03/18/21   [provider]  ?tolnaftate (TINACTIN) 1 % cream Apply 1 application topically 2 (two) times daily. 05/18/21   Elson Areas, PA-C  ?   ? ?Allergies    ?Aleve [naproxen sodium], Aspirin, Ibuprofen, Seroquel [quetiapine fumerate], Tramadol, and Tylenol [acetaminophen]   ? ?Review of Systems   ?Review of Systems  ?Constitutional:  Negative for chills and fever.  ?HENT:  Positive for congestion and sore throat. Negative for trouble swallowing.   ?Respiratory:  Negative for cough and shortness of breath.   ?Cardiovascular:  Negative for chest pain.  ?Gastrointestinal:  Negative for abdominal pain, diarrhea, nausea and vomiting.  ?All other systems reviewed and are negative. ? ?Physical Exam ?Updated Vital Signs ?BP 131/74 (BP Location: Right Arm)   Pulse 88   Temp 98.7 ?F (37.1 ?C) (Oral)   Resp 16   SpO2 99%  ?Physical Exam ?Vitals and nursing note reviewed.  ?Constitutional:   ?   Appearance: Normal appearance.  ?HENT:  ?   Head: Normocephalic and atraumatic.  ?   Mouth/Throat:  ?   Lips: Pink.  ?   Mouth: Mucous membranes are moist.  ?   Pharynx: Uvula midline. Posterior oropharyngeal erythema present.  ?   Tonsils: Tonsillar exudate present. No tonsillar abscesses. 2+ on the right. 1+ on the left.  ?  Eyes:  ?   Conjunctiva/sclera: Conjunctivae normal.  ?Cardiovascular:  ?   Rate and Rhythm: Normal rate and regular rhythm.  ?Pulmonary:  ?   Effort: Pulmonary effort is normal. No respiratory distress.  ?   Breath sounds: Normal breath sounds.  ?Abdominal:  ?   General: There is no distension.  ?   Palpations: Abdomen is soft.  ?   Tenderness: There is no abdominal tenderness.  ?Skin: ?   General: Skin is warm and dry.  ?Neurological:  ?   General: No focal deficit present.  ?   Mental Status: He is alert.  ? ? ?ED Results / Procedures / Treatments   ?Labs ?(all labs ordered are listed, but only abnormal results are  displayed) ?Labs Reviewed  ?GROUP A STREP BY PCR  ? ? ?EKG ?None ? ?Radiology ?No results found. ? ?Procedures ?Procedures  ? ? ?Medications Ordered in ED ?Medications - No data to display ? ?ED Course/ Medical Decision Making/ A&P ?  ?                        ?Medical Decision Making ? ?This patient is a 41 year old male who presents to the ED for concern of sore throat x1 day.  ? ?Differential diagnoses prior to evaluation: ?Viral pharyngitis, strep pharyngitis, esophagitis, dental abnormality, reflux.  This is not an exhaustive differential. ? ?Past Medical History / Co-morbidities / Social History: ?Zaidi, depression, polysubstance abuse, hepatitis C ? ?Physical Exam: ?Physical exam performed. The pertinent findings include: Patient is afebrile, not tachycardic, not hypoxic, no acute distress.  He has 2+ right tonsillar swelling, and 1+ left tonsillar swelling with white exudate.  No evidence of abscess.  Overall oropharynx with erythema, no edema.  Patient is tolerating p.o, and is lying comfortably in exam bed. ? ?Labs: ?Patient tested negative for group A strep. ?  ?Disposition: ?After consideration of the diagnostic results and the patients response to treatment, I feel that patient's not requiring mission or inpatient treatment for symptoms.  As he tested negative for strep, think his symptoms are likely related to a viral pharyngitis.  There is no evidence of tonsillar abscess.  He is overall clinically well-appearing, and I think that we can treat in the outpatient setting symptomatically with over-the-counter medications.  Discussed reasons to return to the emergency department, and the patient is agreeable to the plan. ? ?Final Clinical Impression(s) / ED Diagnoses ?Final diagnoses:  ?Viral pharyngitis  ? ? ?Rx / DC Orders ?ED Discharge Orders   ? ? None  ? ?  ? ?Portions of this report may have been transcribed using voice recognition software. Every effort was made to ensure accuracy; however,  inadvertent computerized transcription errors may be present. ? ?  ?Su Monks, PA-C ?11/15/21 1550 ? ?  ?Gloris Manchester, MD ?11/18/21 1802 ? ?

## 2022-08-27 ENCOUNTER — Emergency Department (HOSPITAL_COMMUNITY): Payer: Medicaid Other

## 2022-08-27 ENCOUNTER — Encounter (HOSPITAL_COMMUNITY): Payer: Self-pay

## 2022-08-27 ENCOUNTER — Emergency Department (HOSPITAL_COMMUNITY)
Admission: EM | Admit: 2022-08-27 | Discharge: 2022-08-27 | Disposition: A | Discharge status: Home / Self Care | Payer: Medicaid Other | Attending: Emergency Medicine | Admitting: Emergency Medicine

## 2022-08-27 DIAGNOSIS — Z202 Contact with and (suspected) exposure to infections with a predominantly sexual mode of transmission: Secondary | ICD-10-CM

## 2022-08-27 DIAGNOSIS — M542 Cervicalgia: Secondary | ICD-10-CM | POA: Diagnosis not present

## 2022-08-27 DIAGNOSIS — R309 Painful micturition, unspecified: Secondary | ICD-10-CM

## 2022-08-27 DIAGNOSIS — M25561 Pain in right knee: Secondary | ICD-10-CM

## 2022-08-27 LAB — HIV ANTIBODY (ROUTINE TESTING W REFLEX): HIV Screen 4th Generation wRfx: NONREACTIVE

## 2022-08-27 LAB — URINALYSIS, ROUTINE W REFLEX MICROSCOPIC
Bilirubin Urine: NEGATIVE
Glucose, UA: NEGATIVE mg/dL
Hgb urine dipstick: NEGATIVE
Ketones, ur: NEGATIVE mg/dL
Leukocytes,Ua: NEGATIVE
Nitrite: NEGATIVE
Protein, ur: NEGATIVE mg/dL
Specific Gravity, Urine: 1.02 (ref 1.005–1.030)
pH: 7 (ref 5.0–8.0)

## 2022-08-27 MED ORDER — METRONIDAZOLE 500 MG PO TABS: 2000.0000 mg | ORAL_TABLET | Freq: Once | ORAL | 0 refills | Status: AC

## 2022-08-27 MED ORDER — AZITHROMYCIN 250 MG PO TABS
1000.0000 mg | ORAL_TABLET | Freq: Once | ORAL | Status: AC
  Administered 2022-08-27: 1000 mg via ORAL
  Filled 2022-08-27: qty 4

## 2022-08-27 MED ORDER — CEFTRIAXONE SODIUM 500 MG IJ SOLR
500.0000 mg | Freq: Once | INTRAMUSCULAR | Status: AC
  Administered 2022-08-27: 500 mg via INTRAMUSCULAR
  Filled 2022-08-27: qty 500

## 2022-08-27 MED ORDER — LIDOCAINE HCL (PF) 1 % IJ SOLN
1.0000 mL | Freq: Once | INTRAMUSCULAR | Status: AC
  Administered 2022-08-27: 1 mL
  Filled 2022-08-27: qty 5
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
## 2022-08-28 LAB — RPR: RPR Ser Ql: NONREACTIVE

## 2022-08-31 LAB — GC/CHLAMYDIA PROBE AMP (~~LOC~~) NOT AT ARMC
Chlamydia: NEGATIVE
Comment: NEGATIVE
Comment: NORMAL
Neisseria Gonorrhea: NEGATIVE

## 2022-11-13 ENCOUNTER — Encounter (HOSPITAL_COMMUNITY): Payer: Self-pay | Admitting: *Deleted

## 2022-11-13 ENCOUNTER — Other Ambulatory Visit: Payer: Self-pay

## 2022-11-13 ENCOUNTER — Emergency Department (HOSPITAL_COMMUNITY)
Admission: EM | Admit: 2022-11-13 | Discharge: 2022-11-13 | Disposition: A | Discharge status: Home / Self Care | Payer: Medicaid Other | Attending: Emergency Medicine | Admitting: Emergency Medicine

## 2022-11-13 DIAGNOSIS — Z5900 Homelessness unspecified: Secondary | ICD-10-CM | POA: Diagnosis not present

## 2022-11-13 DIAGNOSIS — R21 Rash and other nonspecific skin eruption: Secondary | ICD-10-CM | POA: Diagnosis present

## 2022-11-13 MED ORDER — HYDROCORTISONE 2.5 % EX LOTN: TOPICAL_LOTION | Freq: Two times a day (BID) | CUTANEOUS | 0 refills | Status: DC

## 2022-11-13 MED ORDER — DIPHENHYDRAMINE HCL 25 MG PO CAPS
25.0000 mg | ORAL_CAPSULE | Freq: Once | ORAL | Status: AC
  Administered 2022-11-13: 25 mg via ORAL
  Filled 2022-11-13: qty 1

## 2022-11-13 MED ORDER — DEXAMETHASONE SODIUM PHOSPHATE 10 MG/ML IJ SOLN
10.0000 mg | Freq: Once | INTRAMUSCULAR | Status: AC
  Administered 2022-11-13: 10 mg via INTRAMUSCULAR
  Filled 2022-11-13: qty 1

## 2022-11-13 NOTE — ED Provider Notes (Signed)
[facility] Provider Note   CSN: [id number] Arrival date & time: 11/13/22  2119     History  Chief Complaint  Patient presents with   Rash    Cole [name] is a 42 y.o. male.  Patient presents emergency department complaining of a pruritic rash to the bilateral inner thighs secondary to shaving.  Patient denies fevers, nausea, vomiting, or systemic symptoms.  Patient denies any new medication, detergent, soap use.  Patient is homeless and asks about possibly being able to stay in her room overnight due to housing insecurity.  Past medical history significant for anxiety, chronic back pain, substance abuse, hepatitis C  HPI     Home Medications Prior to Admission medications   Medication Sig Start Date End Date Taking? Authorizing Provider  hydrocortisone 2.5 % lotion Apply topically 2 (two) times daily. 11/13/22  Yes Dorothyann Peng, PA-C  ALPRAZolam (XANAX XR) 1 MG 24 hr tablet Take 1 tablet (1 mg total) by mouth daily. Patient not taking: No sig reported 07/20/17   McNew, Tyson Babinski, MD  ALPRAZolam Duanne Moron) 1 MG tablet Take 1 mg by mouth 3 (three) times daily as needed for anxiety. 03/12/21   [provider]  amoxicillin-clavulanate (AUGMENTIN) 875-125 MG tablet Take 1 tablet by mouth every 12 (twelve) hours. 09/22/21   Prosperi, Christian H, PA-C  gabapentin (NEURONTIN) 600 MG tablet Take 1 tablet (600 mg total) by mouth 3 (three) times daily. Patient not taking: Reported on 05/19/2021 07/20/17   Marylin Crosby, MD  gabapentin (NEURONTIN) 800 MG tablet Take 800 mg by mouth 4 (four) times daily. 04/24/21   [provider]  haloperidol (HALDOL) 10 MG tablet Take 1 tablet (10 mg total) by mouth at bedtime. Patient taking differently: Take 10 mg by mouth at bedtime as needed (anxiety). 07/20/17   McNew, Tyson Babinski, MD  methocarbamol (ROBAXIN) 500 MG tablet Take 1 tablet (500 mg total) by mouth 2 (two) times daily. Patient not  taking: No sig reported 07/04/20   Khatri, Hina, PA-C  predniSONE (DELTASONE) 10 MG tablet Take 2 p.o. daily x4 days then 1 p.o. daily x4 days Patient not taking: No sig reported 04/19/20   [name], MD  SUBOXONE 4-1 MG FILM Place 1 strip under the tongue daily. 03/18/21   [provider]  tolnaftate (TINACTIN) 1 % cream Apply 1 application topically 2 (two) times daily. 05/18/21   Fransico Meadow, PA-C      Allergies    Aleve [naproxen sodium], Aspirin, Ibuprofen, Seroquel [quetiapine fumerate], Tramadol, and Tylenol [acetaminophen]    Review of Systems   Review of Systems  Skin:  Positive for rash.    Physical Exam Updated Vital Signs BP (!) 142/92 (BP Location: Right Arm)   Pulse 84   Temp 98.2 F (36.8 C) (Oral)   SpO2 98%  Physical Exam HENT:     Head: Normocephalic and atraumatic.  Eyes:     Conjunctiva/sclera: Conjunctivae normal.  Pulmonary:     Effort: Pulmonary effort is normal. No respiratory distress.  Musculoskeletal:        General: No signs of injury.     Cervical back: Normal range of motion.  Skin:    General: Skin is dry.     Findings: Rash (Red areas on inner thighs consistent with irritant contact dermatitis) present.  Neurological:     Mental Status: He is alert.  Psychiatric:        Speech: Speech  normal.        Behavior: Behavior normal.     ED Results / Procedures / Treatments   Labs (all labs ordered are listed, but only abnormal results are displayed) Labs Reviewed - No data to display  EKG None  Radiology No results found.  Procedures Procedures    Medications Ordered in ED Medications  dexamethasone (DECADRON) injection 10 mg (has no administration in time range)  diphenhydrAMINE (BENADRYL) capsule 25 mg (has no administration in time range)    ED Course/ Medical Decision Making/ A&P                             Medical Decision Making Risk Prescription drug management.   Patient presents with a chief complaint  of a rash.  Differential includes contact dermatitis, SJS, TENS, others  I see no indication at this time for lab work as the patient has no systemic symptoms.  I also see no indication for imaging  The patient's rash appears consistent with contact dermatitis.  This is likely as a result of the patient's shaving.  I did order the patient Decadron for inflammation and Benadryl to help with the pruritus.  Upon reassessment the patient stated he did not feel as "itchy" as he did prior to the medication.  The patient also requested a room to stand tonight and I explained that there is no reason to admit the patient to the hospital at this time.  I did offer to provide resource guides for shelters in the area.  Patient to be discharged at this time.  Patient will follow-up with his primary care provider for further evaluation and management of the rash as needed.        Final Clinical Impression(s) / ED Diagnoses Final diagnoses:  Rash    Rx / DC Orders ED Discharge Orders          Ordered    hydrocortisone 2.5 % lotion  2 times daily        11/13/22 2142              Ronny Bacon 11/13/22 2153    Regan Lemming, MD 11/14/22 1223

## 2022-11-13 NOTE — Discharge Instructions (Addendum)
You were evaluated today for a rash which looks to be due to irritation from shaving.  I have prescribed a cream/lotion that can be applied twice daily over the affected area.  Please follow-up with your primary care provider for further evaluation and management. As discussed, I have provided a resource guide for local shelters in the area.

## 2022-11-13 NOTE — ED Triage Notes (Signed)
Patient with reported rash to both groin/inner thighs after shaving.  He is also complaining of lower back pain.  Patient also requests information regarding shelters.

## 2022-11-15 ENCOUNTER — Emergency Department (HOSPITAL_COMMUNITY): Payer: Medicaid Other

## 2022-11-15 ENCOUNTER — Emergency Department (HOSPITAL_COMMUNITY)
Admission: EM | Admit: 2022-11-15 | Discharge: 2022-11-15 | Disposition: A | Discharge status: Home / Self Care | Payer: Medicaid Other | Attending: Emergency Medicine | Admitting: Emergency Medicine

## 2022-11-15 ENCOUNTER — Encounter (HOSPITAL_COMMUNITY): Payer: Self-pay | Admitting: Emergency Medicine

## 2022-11-15 ENCOUNTER — Other Ambulatory Visit: Payer: Self-pay

## 2022-11-15 ENCOUNTER — Encounter (HOSPITAL_COMMUNITY): Payer: Self-pay

## 2022-11-15 ENCOUNTER — Emergency Department (HOSPITAL_COMMUNITY)
Admission: EM | Admit: 2022-11-15 | Discharge: 2022-11-15 | Disposition: A | Discharge status: Home / Self Care | Payer: Medicaid Other | Source: Home / Self Care | Attending: Emergency Medicine | Admitting: Emergency Medicine

## 2022-11-15 DIAGNOSIS — W228XXA Striking against or struck by other objects, initial encounter: Secondary | ICD-10-CM | POA: Insufficient documentation

## 2022-11-15 DIAGNOSIS — S8002XA Contusion of left knee, initial encounter: Secondary | ICD-10-CM | POA: Insufficient documentation

## 2022-11-15 DIAGNOSIS — M25562 Pain in left knee: Secondary | ICD-10-CM | POA: Diagnosis present

## 2022-11-15 DIAGNOSIS — Y92410 Unspecified street and highway as the place of occurrence of the external cause: Secondary | ICD-10-CM | POA: Insufficient documentation

## 2022-11-15 NOTE — ED Provider Notes (Signed)
[facility]  Provider Note  CSN: [id number] Arrival date & time: 11/15/22 0305  History Chief Complaint  Patient presents with   Knee Pain    Cole [name] is a 42 y.o. male brought by EMS for L knee pain. States he walked from [place] to [place] earlier today. Had some pick him up and reports he 'bumped his knee' on the car door as he was getting out. A few hours later while in bed he reports pain in L knee was worse and associated with swelling. He was able to bear weight with EMS and on arrival here.    Home Medications Prior to Admission medications   Medication Sig Start Date End Date Taking? Authorizing Provider  ALPRAZolam (XANAX XR) 1 MG 24 hr tablet Take 1 tablet (1 mg total) by mouth daily. Patient not taking: No sig reported 07/20/17   McNew, Tyson Babinski, MD  ALPRAZolam Duanne Moron) 1 MG tablet Take 1 mg by mouth 3 (three) times daily as needed for anxiety. 03/12/21   [provider]  amoxicillin-clavulanate (AUGMENTIN) 875-125 MG tablet Take 1 tablet by mouth every 12 (twelve) hours. 09/22/21   Prosperi, Christian H, PA-C  gabapentin (NEURONTIN) 600 MG tablet Take 1 tablet (600 mg total) by mouth 3 (three) times daily. Patient not taking: Reported on 05/19/2021 07/20/17   Marylin Crosby, MD  gabapentin (NEURONTIN) 800 MG tablet Take 800 mg by mouth 4 (four) times daily. 04/24/21   [provider]  haloperidol (HALDOL) 10 MG tablet Take 1 tablet (10 mg total) by mouth at bedtime. Patient taking differently: Take 10 mg by mouth at bedtime as needed (anxiety). 07/20/17   McNew, Tyson Babinski, MD  hydrocortisone 2.5 % lotion Apply topically 2 (two) times daily. 11/13/22   Dorothyann Peng, PA-C  methocarbamol (ROBAXIN) 500 MG tablet Take 1 tablet (500 mg total) by mouth 2 (two) times daily. Patient not taking: No sig reported 07/04/20   Khatri, Hina, PA-C  predniSONE (DELTASONE) 10 MG tablet Take 2 p.o. daily x4 days then 1 p.o.  daily x4 days Patient not taking: No sig reported 04/19/20   [name], MD  SUBOXONE 4-1 MG FILM Place 1 strip under the tongue daily. 03/18/21   [provider]  tolnaftate (TINACTIN) 1 % cream Apply 1 application topically 2 (two) times daily. 05/18/21   Fransico Meadow, PA-C     Allergies    Aleve [naproxen sodium], Aspirin, Ibuprofen, Seroquel [quetiapine fumerate], Tramadol, and Tylenol [acetaminophen]   Review of Systems   Review of Systems Please see HPI for pertinent positives and negatives  Physical Exam BP (!) 120/107 (BP Location: Right Arm)   Pulse 71   Temp 98.6 F (37 C) (Oral)   Resp 18   Ht 5\' 11"  (1.803 m)   Wt 82 kg   SpO2 99%   BMI 25.21 kg/m   Physical Exam Vitals and nursing note reviewed.  HENT:     Head: Normocephalic.     Nose: Nose normal.  Eyes:     Extraocular Movements: Extraocular movements intact.  Pulmonary:     Effort: Pulmonary effort is normal.  Musculoskeletal:        General: No swelling, tenderness or deformity. Normal range of motion.     Cervical back: Neck supple.  Skin:    Findings: No rash (on exposed skin).  Neurological:     Mental Status: He is alert and oriented to person,  place, and time.  Psychiatric:        Mood and Affect: Mood normal.     ED Results / Procedures / Treatments   EKG None  Procedures Procedures  Medications Ordered in the ED Medications - No data to display  Initial Impression and Plan  Patient with L knee pain after minor injury. No external signs of injury. No exam evidence of bony injury. Patient advised imaging is not indicated, although he is requesting an xray or MRI. He is also asking for a knee wrap. He is allergic to NSAIDs and APAP. Advised to follow up with PCP. Rest, ice and elevation.   ED Course       MDM Rules/Calculators/A&P Medical Decision Making Problems Addressed: Acute pain of left knee: acute illness or injury     Final Clinical Impression(s) / ED  Diagnoses Final diagnoses:  Acute pain of left knee    Rx / DC Orders ED Discharge Orders     None        Truddie Hidden, MD 11/15/22 [phone]

## 2022-11-15 NOTE — ED Triage Notes (Signed)
Pt brought in by EMS after he walked home from [facility] to [place]. Had someone pick him up and he hit his knee on car door. Pt went to bed and then woke up with pain in L knee. Pt ambulatory to stretcher for EMS as well as ambulatory to room from EMS bay.

## 2022-11-15 NOTE — ED Provider Notes (Signed)
[name] Note   CSN: [id number] Arrival date & time: 11/15/22  1617     History  Chief Complaint  Patient presents with   wants medication    [name] is a 42 y.o. male.  HPI   42 year old male history of chronic anxiety medication use in the form of alprazolam, reports that he has a history of chronic pain and is supposed to be taking Suboxone at least he was in the past currently not taking any chronic medications.  States that last night while he was getting out of his car he accidentally shut the door striking his left knee on the lateral aspect.  He was seen last night, placed in a knee brace and sent home.  He states that the pain continues today and though he is ambulatory on it he is concerned about the ongoing pain.  He has his first visit with the pain clinic tomorrow morning  Home Medications Prior to Admission medications   Medication Sig Start Date End Date Taking? Authorizing Provider  ALPRAZolam (XANAX XR) 1 MG 24 hr tablet Take 1 tablet (1 mg total) by mouth daily. Patient not taking: No sig reported 07/20/17   McNew, Tyson Babinski, MD  ALPRAZolam Duanne Moron) 1 MG tablet Take 1 mg by mouth 3 (three) times daily as needed for anxiety. 03/12/21   [provider]  amoxicillin-clavulanate (AUGMENTIN) 875-125 MG tablet Take 1 tablet by mouth every 12 (twelve) hours. 09/22/21   Prosperi, Christian H, PA-C  gabapentin (NEURONTIN) 600 MG tablet Take 1 tablet (600 mg total) by mouth 3 (three) times daily. Patient not taking: Reported on 05/19/2021 07/20/17   Marylin Crosby, MD  gabapentin (NEURONTIN) 800 MG tablet Take 800 mg by mouth 4 (four) times daily. 04/24/21   [provider]  haloperidol (HALDOL) 10 MG tablet Take 1 tablet (10 mg total) by mouth at bedtime. Patient taking differently: Take 10 mg by mouth at bedtime as needed (anxiety). 07/20/17   McNew, Tyson Babinski, MD  hydrocortisone 2.5 % lotion Apply  topically 2 (two) times daily. 11/13/22   Dorothyann Peng, PA-C  methocarbamol (ROBAXIN) 500 MG tablet Take 1 tablet (500 mg total) by mouth 2 (two) times daily. Patient not taking: No sig reported 07/04/20   Khatri, Hina, PA-C  predniSONE (DELTASONE) 10 MG tablet Take 2 p.o. daily x4 days then 1 p.o. daily x4 days Patient not taking: No sig reported 04/19/20   [name], MD  SUBOXONE 4-1 MG FILM Place 1 strip under the tongue daily. 03/18/21   [provider]  tolnaftate (TINACTIN) 1 % cream Apply 1 application topically 2 (two) times daily. 05/18/21   Fransico Meadow, PA-C      Allergies    Aleve [naproxen sodium], Aspirin, Ibuprofen, Seroquel [quetiapine fumerate], Tramadol, and Tylenol [acetaminophen]    Review of Systems   Review of Systems  All other systems reviewed and are negative.   Physical Exam Updated Vital Signs BP 139/88   Pulse 69   Temp 98.2 F (36.8 C) (Oral)   Resp 18   Ht 1.803 m (5\' 11" )   Wt 81.6 kg   SpO2 98%   BMI 25.10 kg/m  Physical Exam Vitals and nursing note reviewed.  Constitutional:      General: He is not in acute distress.    Appearance: He is well-developed.  HENT:     Head: Normocephalic and atraumatic.     Mouth/Throat:  Pharynx: No oropharyngeal exudate.  Eyes:     General: No scleral icterus.       Right eye: No discharge.        Left eye: No discharge.     Conjunctiva/sclera: Conjunctivae normal.     Pupils: Pupils are equal, round, and reactive to light.  Neck:     Thyroid: No thyromegaly.     Vascular: No JVD.  Cardiovascular:     Rate and Rhythm: Normal rate and regular rhythm.     Heart sounds: Normal heart sounds. No murmur heard.    No friction rub. No gallop.  Pulmonary:     Effort: Pulmonary effort is normal. No respiratory distress.     Breath sounds: Normal breath sounds. No wheezing or rales.  Abdominal:     General: Bowel sounds are normal. There is no distension.     Palpations: Abdomen is soft.  There is no mass.     Tenderness: There is no abdominal tenderness.  Musculoskeletal:        General: Tenderness present. Normal range of motion.     Cervical back: Normal range of motion and neck supple.     Right lower leg: No edema.     Left lower leg: No edema.     Comments: Good range of motion of the left knee, stable joint without any instability, tenderness to palpation over the lateral aspect of the knee but no signs of effusion skin injury redness or hematomas or ecchymosis  Lymphadenopathy:     Cervical: No cervical adenopathy.  Skin:    General: Skin is warm and dry.     Findings: No erythema or rash.  Neurological:     Mental Status: He is alert.     Coordination: Coordination normal.     Comments: Slight antalgic gait but stable  Psychiatric:        Behavior: Behavior normal.     ED Results / Procedures / Treatments   Labs (all labs ordered are listed, but only abnormal results are displayed) Labs Reviewed - No data to display  EKG None  Radiology DG Knee Complete 4 Views Left  Result Date: 11/15/2022 CLINICAL DATA:  Left knee pain after trauma against a car door EXAM: LEFT KNEE - COMPLETE 4 VIEW COMPARISON:  Right knee radiographs dated 01/21/2018 FINDINGS: No evidence of fracture, dislocation, or joint effusion. No evidence of arthropathy or other focal bone abnormality. Mild prepatellar soft tissue swelling. IMPRESSION: Mild prepatellar soft tissue swelling. No acute fracture or dislocation. Electronically Signed   By: Darrin Nipper M.D.   On: 11/15/2022 17:21    Procedures Procedures    Medications Ordered in ED Medications - No data to display  ED Course/ Medical Decision Making/ A&P                             Medical Decision Making Amount and/or Complexity of Data Reviewed Radiology: ordered.   Ultimately patient returns with ongoing pain in his knee, imaging not obtained last night which I think is very reasonable but given the ongoing pain we  will obtain an x-ray of the knee to make sure there is nothing else wrong.  The patient reports to me that he is not allergic to any medications whatsoever.  That being said on his allergy list he puts both Tylenol and anti-inflammatories.  I do not feel strongly that he needs acute pain medication.  This is likely  just a small contusion, x-ray will be obtained and if there is fractures he will be treated more aggressively otherwise he can be discharged home to follow-up in the outpatient setting.  As suspected the x-ray of the knee was negative for fracture.  Patient allergic to COVID medications, he may use a lidocaine patch as needed for pain but at this time he is stated that he gets hives and rash with both Tylenol and ibuprofen thus I will not give him any of these medications.  He does not need opiates for this condition        Final Clinical Impression(s) / ED Diagnoses Final diagnoses:  Contusion of left knee, initial encounter    Rx / DC Orders ED Discharge Orders     None         Noemi Chapel, MD 11/15/22 1734

## 2022-11-15 NOTE — Discharge Instructions (Signed)
Your x-ray shows no broken bones, you may take Tylenol or ibuprofen.  See your pain clinic in the morning

## 2022-11-15 NOTE — ED Triage Notes (Signed)
Pt was seen for knee pain earlier today and wants a dose of pain medication now.

## 2022-11-19 ENCOUNTER — Emergency Department (HOSPITAL_COMMUNITY)
Admission: EM | Admit: 2022-11-19 | Discharge: 2022-11-19 | Disposition: A | Discharge status: Home / Self Care | Payer: Medicaid Other | Attending: Emergency Medicine | Admitting: Emergency Medicine

## 2022-11-19 ENCOUNTER — Encounter (HOSPITAL_COMMUNITY): Payer: Self-pay | Admitting: Emergency Medicine

## 2022-11-19 ENCOUNTER — Other Ambulatory Visit: Payer: Self-pay

## 2022-11-19 DIAGNOSIS — J45909 Unspecified asthma, uncomplicated: Secondary | ICD-10-CM | POA: Diagnosis not present

## 2022-11-19 DIAGNOSIS — K047 Periapical abscess without sinus: Secondary | ICD-10-CM | POA: Diagnosis not present

## 2022-11-19 DIAGNOSIS — R6884 Jaw pain: Secondary | ICD-10-CM | POA: Diagnosis present

## 2022-11-19 MED ORDER — AMOXICILLIN 500 MG PO CAPS: 1000.0000 mg | ORAL_CAPSULE | Freq: Two times a day (BID) | ORAL | 0 refills | Status: DC

## 2022-11-19 MED ORDER — AMOXICILLIN 250 MG PO CAPS
1000.0000 mg | ORAL_CAPSULE | Freq: Once | ORAL | Status: AC
  Administered 2022-11-19: 1000 mg via ORAL
  Filled 2022-11-19: qty 4

## 2022-11-19 NOTE — ED Triage Notes (Signed)
Pt c/o left upper dental pain that started yesterday. Pt appears intoxicated on arrival.

## 2022-11-19 NOTE — ED Provider Notes (Signed)
[facility] Provider Note   CSN: [id number] Arrival date & time: 11/19/22  0458     History  Chief Complaint  Patient presents with   Dental Pain    [name] is a 42 y.o. male.  The history is provided by the patient.  Dental Pain He has history of asthma, substance abuse complains of pain in his left upper jaw which he thinks might be an infection.  He has taken ibuprofen and acetaminophen without relief.  He denies fever or chills.  He has a Pharmacist, community but has not called to make an appointment.   Home Medications Prior to Admission medications   Medication Sig Start Date End Date Taking? Authorizing Provider  ALPRAZolam (XANAX XR) 1 MG 24 hr tablet Take 1 tablet (1 mg total) by mouth daily. Patient not taking: No sig reported 07/20/17   McNew, Tyson Babinski, MD  ALPRAZolam Duanne Moron) 1 MG tablet Take 1 mg by mouth 3 (three) times daily as needed for anxiety. 03/12/21   [provider]  amoxicillin-clavulanate (AUGMENTIN) 875-125 MG tablet Take 1 tablet by mouth every 12 (twelve) hours. 09/22/21   Prosperi, Christian H, PA-C  gabapentin (NEURONTIN) 600 MG tablet Take 1 tablet (600 mg total) by mouth 3 (three) times daily. Patient not taking: Reported on 05/19/2021 07/20/17   Marylin Crosby, MD  gabapentin (NEURONTIN) 800 MG tablet Take 800 mg by mouth 4 (four) times daily. 04/24/21   [provider]  haloperidol (HALDOL) 10 MG tablet Take 1 tablet (10 mg total) by mouth at bedtime. Patient taking differently: Take 10 mg by mouth at bedtime as needed (anxiety). 07/20/17   McNew, Tyson Babinski, MD  hydrocortisone 2.5 % lotion Apply topically 2 (two) times daily. 11/13/22   Dorothyann Peng, PA-C  methocarbamol (ROBAXIN) 500 MG tablet Take 1 tablet (500 mg total) by mouth 2 (two) times daily. Patient not taking: No sig reported 07/04/20   Khatri, Hina, PA-C  predniSONE (DELTASONE) 10 MG tablet Take 2 p.o. daily x4 days then 1 p.o.  daily x4 days Patient not taking: No sig reported 04/19/20   [name], MD  SUBOXONE 4-1 MG FILM Place 1 strip under the tongue daily. 03/18/21   [provider]  tolnaftate (TINACTIN) 1 % cream Apply 1 application topically 2 (two) times daily. 05/18/21   Fransico Meadow, PA-C      Allergies    Aleve [naproxen sodium], Aspirin, Ibuprofen, Seroquel [quetiapine fumerate], Tramadol, and Tylenol [acetaminophen]    Review of Systems   Review of Systems  All other systems reviewed and are negative.   Physical Exam Updated Vital Signs BP 126/73 (BP Location: Right Arm)   Pulse 69   Temp 98 F (36.7 C) (Oral)   Resp 16   Ht 5\' 11"  (1.803 m)   Wt 81.6 kg   SpO2 100%   BMI 25.10 kg/m  Physical Exam Vitals and nursing note reviewed.   42 year old male, resting comfortably and in no acute distress. Vital signs are normal. Oxygen saturation is 100%, which is normal. Head is normocephalic and atraumatic. PERRLA, EOMI. Dental abscess present for tooth #15. Neck is nontender and supple without adenopathy. Lungs are clear without rales, wheezes, or rhonchi. Chest is nontender. Heart has regular rate and rhythm without murmur. Abdomen is soft, flat, nontender. Skin is warm and dry without rash. Neurologic: Mental status is normal, cranial nerves are intact, moves all extremities equally.  ED  Results / Procedures / Treatments    Procedures Procedures    Medications Ordered in ED Medications  amoxicillin (AMOXIL) capsule 1,000 mg (has no administration in time range)    ED Course/ Medical Decision Making/ A&P                             Medical Decision Making  Dental abscess for tooth #15.  I have ordered a dose of amoxicillin and I am discharging him with a prescription for amoxicillin.  He is advised to swish and spit warm salt water as needed, take over-the-counter ibuprofen and acetaminophen as needed for pain.  I have referred him to the on-call dentist but advised  that he should see whichever dentist can see him soonest.  Final Clinical Impression(s) / ED Diagnoses Final diagnoses:  Dental abscess    Rx / DC Orders ED Discharge Orders          Ordered    amoxicillin (AMOXIL) 500 MG capsule  2 times daily        11/19/22 [id number]              Delora Fuel, [place] [id number] [phone]

## 2022-11-19 NOTE — Discharge Instructions (Addendum)
Switch some warm salt water around her mouth and spit out as needed.  Take ibuprofen and/or acetaminophen as needed for pain.  If you combine ibuprofen and acetaminophen, you will get better pain relief than you would get from taking either medication by itself.  The antibiotic may help, but you need to see a dentist for definitive care of the abscess.

## 2022-12-09 ENCOUNTER — Encounter (HOSPITAL_COMMUNITY): Payer: Self-pay | Admitting: Emergency Medicine

## 2022-12-09 ENCOUNTER — Other Ambulatory Visit: Payer: Self-pay

## 2022-12-09 ENCOUNTER — Emergency Department (HOSPITAL_COMMUNITY): Payer: Medicaid Other

## 2022-12-09 ENCOUNTER — Emergency Department (HOSPITAL_COMMUNITY)
Admission: EM | Admit: 2022-12-09 | Discharge: 2022-12-09 | Disposition: A | Discharge status: Home / Self Care | Payer: Medicaid Other | Attending: Emergency Medicine | Admitting: Emergency Medicine

## 2022-12-09 DIAGNOSIS — S62601A Fracture of unspecified phalanx of left index finger, initial encounter for closed fracture: Secondary | ICD-10-CM | POA: Insufficient documentation

## 2022-12-09 DIAGNOSIS — X150XXA Contact with hot stove (kitchen), initial encounter: Secondary | ICD-10-CM | POA: Insufficient documentation

## 2022-12-09 DIAGNOSIS — S60941A Unspecified superficial injury of left index finger, initial encounter: Secondary | ICD-10-CM | POA: Diagnosis present

## 2022-12-09 DIAGNOSIS — S62609A Fracture of unspecified phalanx of unspecified finger, initial encounter for closed fracture: Secondary | ICD-10-CM

## 2022-12-09 MED ORDER — OXYCODONE HCL 5 MG PO TABS
5.0000 mg | ORAL_TABLET | Freq: Once | ORAL | Status: AC
  Administered 2022-12-09: 5 mg via ORAL
  Filled 2022-12-09: qty 1

## 2022-12-09 NOTE — Discharge Instructions (Signed)
Please follow up with Dr. Romeo Apple with hand within the next week to ensure you are going the right direction.  You may return to the emergency room at anytime for any worsening symptoms.

## 2022-12-09 NOTE — ED Triage Notes (Signed)
Pt mashed left index finger while moving cast iron wood stove about 1 hour ago.

## 2022-12-09 NOTE — ED Provider Notes (Signed)
[facility] AT [facility] Provider Note   CSN: [id number] Arrival date & time: 12/09/22  1713     History Chief Complaint  Patient presents with   Hand Pain    [name] is a 42 y.o. male patient who presents to [name] emergency department today for further evaluation of left second finger pain after dropping a cast iron stove 1 hour ago.  He has been unable to use [name] finger since then secondary to pain.  No open cuts. He denies any other injury.    Hand Pain       Home Medications Prior to Admission medications   Medication Sig Start Date End Date Taking? Authorizing Provider  ALPRAZolam (XANAX XR) 1 MG 24 hr tablet Take 1 tablet (1 mg total) by mouth daily. Patient not taking: No sig reported 07/20/17   McNew, Ileene Hutchinson, MD  ALPRAZolam [name]) 1 MG tablet Take 1 mg by mouth 3 (three) times daily as needed for anxiety. 03/12/21   [provider]  amoxicillin (AMOXIL) 500 MG capsule Take 2 capsules (1,000 mg total) by mouth 2 (two) times daily. 11/19/22   [facility] Booze, MD  gabapentin (NEURONTIN) 600 MG tablet Take 1 tablet (600 mg total) by mouth 3 (three) times daily. Patient not taking: Reported on 05/19/2021 07/20/17   [facility] Riling, MD  gabapentin (NEURONTIN) 800 MG tablet Take 800 mg by mouth 4 (four) times daily. 04/24/21   [provider]  haloperidol (HALDOL) 10 MG tablet Take 1 tablet (10 mg total) by mouth at bedtime. Patient taking differently: Take 10 mg by mouth at bedtime as needed (anxiety). 07/20/17   McNew, Ileene Hutchinson, MD  hydrocortisone 2.5 % lotion Apply topically 2 (two) times daily. 11/13/22   [facility] Grinder, PA-C  methocarbamol (ROBAXIN) 500 MG tablet Take 1 tablet (500 mg total) by mouth 2 (two) times daily. Patient not taking: No sig reported 07/04/20   Khatri, Hina, PA-C  SUBOXONE 4-1 MG FILM Place 1 strip under [name] tongue daily. 03/18/21   [provider]  tolnaftate (TINACTIN) 1 % cream Apply 1  application topically 2 (two) times daily. 05/18/21   [facility] Areas, PA-C      Allergies    Aleve [naproxen sodium], Aspirin, Ibuprofen, Seroquel [quetiapine fumerate], Tramadol, and Tylenol [acetaminophen]    Review of Systems   Review of Systems  All other systems reviewed and are negative.   Physical Exam Updated Vital Signs BP 122/74   Pulse 66   Temp 98.9 F (37.2 C) (Oral)   Resp 17   Ht 5\' 9"  (1.753 m)   Wt 83.9 kg   SpO2 99%   BMI 27.32 kg/m  Physical Exam Vitals and nursing note reviewed.  Constitutional:      Appearance: Normal appearance.  HENT:     Head: Normocephalic and atraumatic.  Eyes:     General:        Right eye: No discharge.        Left eye: No discharge.     Conjunctiva/sclera: Conjunctivae normal.  Pulmonary:     Effort: Pulmonary effort is normal.  Musculoskeletal:     Comments: Decreased range of motion in [name] left second finger secondary to pain.  Good cap refill.  Neurovascularly intact.  Slight swelling at [name] PIP. No open wounds. Nail intact.   Skin:    General: Skin is warm and dry.     Findings: No rash.  Neurological:  General: No focal deficit present.     Mental Status: He is alert.  Psychiatric:        Mood and Affect: Mood normal.        Behavior: Behavior normal.     ED Results / Procedures / Treatments   Labs (all labs ordered are listed, but only abnormal results are displayed) Labs Reviewed - No data to display  EKG None  Radiology DG Finger Index Left  Result Date: 12/09/2022 CLINICAL DATA:  Blunt trauma to finger. EXAM: LEFT INDEX FINGER 2+V COMPARISON:  April 09, 2017 FINDINGS: There is severely comminuted triplane fracture of [name] middle second phalanx. [name] fracture likely extends to both adjacent joint spaces. Mild soft tissue swelling. IMPRESSION: Severely comminuted triplane fracture of [name] middle second phalanx. Electronically Signed   By: [name] M.D.   On: 12/09/2022 18:11     Procedures Procedures    Medications Ordered in ED Medications  oxyCODONE (Oxy IR/ROXICODONE) immediate release tablet 5 mg (5 mg Oral Given 12/09/22 1843)    ED Course/ Medical Decision Making/ A&P   {   Click here for ABCD2, HEART and other calculators  Medical Decision Making [name] is a 42 y.o. male patient who presents to [name] emergency department today for further evaluation of left finger injury.  Considering this was a crush injury I will get an x-ray to further evaluate.  There is no evidence of open lacerations or wounds.  Nail is intact.  Patient does have good cap refill although [name] finger is swollen with decreased range of motion as in [name] physical exam.  I have concern for fracture.  After looking at [name] imaging there is a significantly comminuted fracture of [name] left second finger.  I will place [name] patient in a static splint.  There is no evidence of open fracture.  I will give him some pain medicine here and have him follow-up with hand in [name] outpatient setting.  Strict return precautions were discussed.  He is safe for discharge.    Amount and/or Complexity of Data Reviewed Radiology: ordered.  Risk Prescription drug management.   Final Clinical Impression(s) / ED Diagnoses Final diagnoses:  Comminuted fracture of phalanx of finger    Rx / DC Orders ED Discharge Orders     None         Jolyn Lent 12/09/22 1849    Rondel Baton, MD 12/10/22 [phone]

## 2022-12-10 ENCOUNTER — Telehealth: Payer: Self-pay | Admitting: Orthopedic Surgery

## 2022-12-10 NOTE — Telephone Encounter (Signed)
This patient was seen in the ED at [facility] on 12/09/22 for a left index finger fracture.  Patient has Medicaid (noted for when scheduling).  Please advise when to schedule.  [phone]

## 2022-12-10 NOTE — Telephone Encounter (Signed)
Spoke w/the patient, scheduled for 12/14/22 at 10am, advised to arrive by 9:30am.  Patient verbalized understanding.

## 2022-12-10 NOTE — Telephone Encounter (Signed)
Just told wendy to start an ER fracture clinic follow up tues 10 am -12

## 2022-12-12 ENCOUNTER — Emergency Department (HOSPITAL_COMMUNITY)
Admission: EM | Admit: 2022-12-12 | Discharge: 2022-12-13 | Disposition: A | Discharge status: Other Acute Inpatient Hospital | Payer: No Typology Code available for payment source | Attending: Emergency Medicine | Admitting: Emergency Medicine

## 2022-12-12 ENCOUNTER — Other Ambulatory Visit: Payer: Self-pay

## 2022-12-12 ENCOUNTER — Encounter (HOSPITAL_COMMUNITY): Payer: Self-pay | Admitting: *Deleted

## 2022-12-12 DIAGNOSIS — F32A Depression, unspecified: Secondary | ICD-10-CM

## 2022-12-12 DIAGNOSIS — Z5900 Homelessness unspecified: Secondary | ICD-10-CM | POA: Diagnosis not present

## 2022-12-12 DIAGNOSIS — R45851 Suicidal ideations: Secondary | ICD-10-CM | POA: Diagnosis not present

## 2022-12-12 DIAGNOSIS — F1910 Other psychoactive substance abuse, uncomplicated: Secondary | ICD-10-CM | POA: Diagnosis present

## 2022-12-12 DIAGNOSIS — J45909 Unspecified asthma, uncomplicated: Secondary | ICD-10-CM | POA: Insufficient documentation

## 2022-12-12 DIAGNOSIS — F1994 Other psychoactive substance use, unspecified with psychoactive substance-induced mood disorder: Secondary | ICD-10-CM | POA: Diagnosis present

## 2022-12-12 DIAGNOSIS — F329 Major depressive disorder, single episode, unspecified: Secondary | ICD-10-CM | POA: Diagnosis not present

## 2022-12-12 DIAGNOSIS — F1424 Cocaine dependence with cocaine-induced mood disorder: Secondary | ICD-10-CM | POA: Diagnosis not present

## 2022-12-12 LAB — CBC WITH DIFFERENTIAL/PLATELET
Abs Immature Granulocytes: 0.02 10*3/uL (ref 0.00–0.07)
Basophils Absolute: 0 10*3/uL (ref 0.0–0.1)
Basophils Relative: 0 %
Eosinophils Absolute: 0.2 10*3/uL (ref 0.0–0.5)
Eosinophils Relative: 2 %
HCT: 39.8 % (ref 39.0–52.0)
Hemoglobin: 13 g/dL (ref 13.0–17.0)
Immature Granulocytes: 0 %
Lymphocytes Relative: 37 %
Lymphs Abs: 2.8 10*3/uL (ref 0.7–4.0)
MCH: 25.1 pg — ABNORMAL LOW (ref 26.0–34.0)
MCHC: 32.7 g/dL (ref 30.0–36.0)
MCV: 76.8 fL — ABNORMAL LOW (ref 80.0–100.0)
Monocytes Absolute: 0.6 10*3/uL (ref 0.1–1.0)
Monocytes Relative: 8 %
Neutro Abs: 3.9 10*3/uL (ref 1.7–7.7)
Neutrophils Relative %: 53 %
Platelets: 178 10*3/uL (ref 150–400)
RBC: 5.18 MIL/uL (ref 4.22–5.81)
RDW: 13.5 % (ref 11.5–15.5)
WBC: 7.5 10*3/uL (ref 4.0–10.5)
nRBC: 0 % (ref 0.0–0.2)

## 2022-12-12 LAB — RAPID URINE DRUG SCREEN, HOSP PERFORMED
Amphetamines: NOT DETECTED
Barbiturates: NOT DETECTED
Benzodiazepines: NOT DETECTED
Cocaine: POSITIVE — AB
Opiates: NOT DETECTED
Tetrahydrocannabinol: POSITIVE — AB

## 2022-12-12 LAB — COMPREHENSIVE METABOLIC PANEL
ALT: 18 U/L (ref 0–44)
AST: 18 U/L (ref 15–41)
Albumin: 4.1 g/dL (ref 3.5–5.0)
Alkaline Phosphatase: 52 U/L (ref 38–126)
Anion gap: 9 (ref 5–15)
BUN: 14 mg/dL (ref 6–20)
CO2: 27 mmol/L (ref 22–32)
Calcium: 9 mg/dL (ref 8.9–10.3)
Chloride: 103 mmol/L (ref 98–111)
Creatinine, Ser: 0.93 mg/dL (ref 0.61–1.24)
GFR, Estimated: >60 mL/min (ref >60)
Glucose, Bld: 90 mg/dL (ref 70–99)
Potassium: 3.5 mmol/L (ref 3.5–5.1)
Sodium: 139 mmol/L (ref 135–145)
Total Bilirubin: 1.2 mg/dL (ref 0.3–1.2)
Total Protein: 7.9 g/dL (ref 6.5–8.1)

## 2022-12-12 LAB — ETHANOL: Alcohol, Ethyl (B): <10 mg/dL (ref <10)

## 2022-12-12 NOTE — ED Notes (Signed)
Pt medically cleared, awaiting TTS consult.

## 2022-12-12 NOTE — ED Triage Notes (Signed)
Pt with c/o depression and SI for past few days, denies any plan.  Admits to smoking marijuana. Pt is homeless.   Also c/o left index finger swelling.   Pt wanded in triage by security.

## 2022-12-12 NOTE — ED Notes (Signed)
Telemonitor moved into Pts room. Telemonitor called and informed Pt is ready to be monitored. RN awaiting confirmation phone call that the Telemonitor personnel have "eyes" on the Pt. Pt remains calm and cooperative. Pt provided beverage and meal tray.

## 2022-12-12 NOTE — ED Provider Notes (Signed)
[place] EMERGENCY DEPARTMENT AT [facility] Provider Note   CSN: [id number] Arrival date & time: 12/12/22  1826     History  Chief Complaint  Patient presents with   V70.1    Cole [name] is a 42 y.o. male with history of asthma, chronic pain, depression, hepatitis C, substance use disorder who presents to the ER complaining of depression and suicidal ideation for several days. Denies plan of self injury. Reports he is homeless and that has been causing increased stress and worsening of depression. He is wanting to go to rehab but denies needing detox from any particular substance. Denies HI or AVH. Complaining of left index finger pain, fractured it the other day but took off the splint because it felt too tight. No other complaints.   HPI     Home Medications Prior to Admission medications   Medication Sig Start Date End Date Taking? Authorizing Provider  ALPRAZolam (XANAX XR) 1 MG 24 hr tablet Take 1 tablet (1 mg total) by mouth daily. Patient not taking: No sig reported 07/20/17   McNew, Ileene Hutchinson, MD  ALPRAZolam Prudy Feeler) 1 MG tablet Take 1 mg by mouth 3 (three) times daily as needed for anxiety. 03/12/21   [provider]  amoxicillin (AMOXIL) 500 MG capsule Take 2 capsules (1,000 mg total) by mouth 2 (two) times daily. 11/19/22   Dione Booze, MD  gabapentin (NEURONTIN) 600 MG tablet Take 1 tablet (600 mg total) by mouth 3 (three) times daily. Patient not taking: Reported on 05/19/2021 07/20/17   Haskell Riling, MD  gabapentin (NEURONTIN) 800 MG tablet Take 800 mg by mouth 4 (four) times daily. 04/24/21   [provider]  haloperidol (HALDOL) 10 MG tablet Take 1 tablet (10 mg total) by mouth at bedtime. Patient taking differently: Take 10 mg by mouth at bedtime as needed (anxiety). 07/20/17   McNew, Ileene Hutchinson, MD  hydrocortisone 2.5 % lotion Apply topically 2 (two) times daily. 11/13/22   Darrick Grinder, PA-C  methocarbamol (ROBAXIN) 500 MG tablet  Take 1 tablet (500 mg total) by mouth 2 (two) times daily. Patient not taking: No sig reported 07/04/20   Khatri, Hina, PA-C  SUBOXONE 4-1 MG FILM Place 1 strip under the tongue daily. 03/18/21   [provider]  tolnaftate (TINACTIN) 1 % cream Apply 1 application topically 2 (two) times daily. 05/18/21   Elson Areas, PA-C      Allergies    Aleve [naproxen sodium], Aspirin, Ibuprofen, Seroquel [quetiapine fumerate], Tramadol, and Tylenol [acetaminophen]    Review of Systems   Review of Systems  Musculoskeletal:        Left index finger pain  Psychiatric/Behavioral:  Positive for suicidal ideas. Negative for self-injury.   All other systems reviewed and are negative.   Physical Exam Updated Vital Signs BP 129/69 (BP Location: Right Arm)   Pulse 79   Temp (!) 97.5 F (36.4 C)   Resp 18   Ht  (1.753 m)   Wt 83.9 kg   SpO2 97%   BMI 27.32 kg/m  Physical Exam Vitals and nursing note reviewed.  Constitutional:      Appearance: Normal appearance.  HENT:     Head: Normocephalic and atraumatic.  Eyes:     Conjunctiva/sclera: Conjunctivae normal.  Pulmonary:     Effort: Pulmonary effort is normal. No respiratory distress.  Musculoskeletal:     Comments: Generalized swelling of the left 2nd digit, decreased flexion of PIP due  to pain, normal capillary refill  Skin:    General: Skin is warm and dry.     Capillary Refill: Capillary refill takes less than 2 seconds.  Neurological:     Mental Status: He is alert.  Psychiatric:        Attention and Perception: He does not perceive auditory or visual hallucinations.        Mood and Affect: Mood is depressed.        Speech: Speech normal.        Behavior: Behavior normal.        Thought Content: Thought content includes suicidal ideation. Thought content does not include homicidal ideation. Thought content does not include homicidal or suicidal plan.     ED Results / Procedures / Treatments   Labs (all labs  ordered are listed, but only abnormal results are displayed) Labs Reviewed  CBC WITH DIFFERENTIAL/PLATELET - Abnormal; Notable for the following components:      Result Value   MCV 76.8 (*)    MCH 25.1 (*)    All other components within normal limits  COMPREHENSIVE METABOLIC PANEL  ETHANOL  RAPID URINE DRUG SCREEN, HOSP PERFORMED    EKG None  Radiology No results found.  Procedures Procedures    Medications Ordered in ED Medications - No data to display  ED Course/ Medical Decision Making/ A&P                             Medical Decision Making Amount and/or Complexity of Data Reviewed Labs: ordered.  Patient is a 42 y.o. male  who presents to the emergency department for psychiatric complaint. He is here voluntarily.   Past Medical History: asthma, chronic pain, depression, hepatitis C, substance use disorder, homelessness  Chart reviewed. Patient seen here on 4/11 and diagnosed with comminuted fracture of phalanx of finger on L 2nd digit. Took off the splint because it was "too tight". Is scheduled for orthopedic follow up on 4/16 at 10 am.   Physical Exam: Normal vitals, in no acute distress. Complaining of SI without a plan. No HI or AVH. Left pointer finger with pain, decreased ROM, consistent with prior fracture, no acute change.   Labs: Medical clearance labs ordered, with following pertinent results: CBC and CMP unremarkable.  Negative ethanol.  UDS pending.  Cardiac monitoring: EKG ordered and has not yet been obtained.   Disposition: Patient is otherwise medically cleared at this time pending medical clearance laboratory evaluation. Will consult TTS and appreciate their recommendations. Patient demonstrates capacity to make medical decisions and I do not believe he is imminently a danger to himself or others, and thus does not meet criteria for IVC at this time.   Final Clinical Impression(s) / ED Diagnoses Final diagnoses:  Suicidal ideation   Homelessness  Depression, unspecified depression type    Rx / DC Orders ED Discharge Orders     None      Portions of this report may have been transcribed using voice recognition software. Every effort was made to ensure accuracy; however, inadvertent computerized transcription errors may be present.    Jeanella Flattery 12/12/22 2158    Eber Hong, MD 12/13/22 2033

## 2022-12-13 ENCOUNTER — Encounter (HOSPITAL_COMMUNITY): Payer: Self-pay | Admitting: Registered Nurse

## 2022-12-13 ENCOUNTER — Encounter (HOSPITAL_COMMUNITY): Payer: Self-pay | Admitting: Emergency Medicine

## 2022-12-13 ENCOUNTER — Ambulatory Visit (HOSPITAL_COMMUNITY)
Admission: EM | Admit: 2022-12-13 | Discharge: 2022-12-14 | Disposition: A | Discharge status: Home / Self Care | Payer: No Typology Code available for payment source | Attending: Psychiatry | Admitting: Psychiatry

## 2022-12-13 DIAGNOSIS — F419 Anxiety disorder, unspecified: Secondary | ICD-10-CM | POA: Insufficient documentation

## 2022-12-13 DIAGNOSIS — F1910 Other psychoactive substance abuse, uncomplicated: Secondary | ICD-10-CM | POA: Diagnosis present

## 2022-12-13 DIAGNOSIS — R45851 Suicidal ideations: Secondary | ICD-10-CM

## 2022-12-13 DIAGNOSIS — Z5900 Homelessness unspecified: Secondary | ICD-10-CM

## 2022-12-13 DIAGNOSIS — F319 Bipolar disorder, unspecified: Secondary | ICD-10-CM | POA: Insufficient documentation

## 2022-12-13 DIAGNOSIS — F1994 Other psychoactive substance use, unspecified with psychoactive substance-induced mood disorder: Secondary | ICD-10-CM

## 2022-12-13 DIAGNOSIS — Z1152 Encounter for screening for COVID-19: Secondary | ICD-10-CM | POA: Insufficient documentation

## 2022-12-13 DIAGNOSIS — F22 Delusional disorders: Secondary | ICD-10-CM | POA: Insufficient documentation

## 2022-12-13 DIAGNOSIS — Z79899 Other long term (current) drug therapy: Secondary | ICD-10-CM | POA: Insufficient documentation

## 2022-12-13 HISTORY — DX: Homelessness unspecified: Z59.00

## 2022-12-13 HISTORY — DX: Suicidal ideations: R45.851

## 2022-12-13 HISTORY — DX: Other psychoactive substance abuse, uncomplicated: F19.10

## 2022-12-13 HISTORY — DX: Other psychoactive substance use, unspecified with psychoactive substance-induced mood disorder: F19.94

## 2022-12-13 LAB — SARS CORONAVIRUS 2 BY RT PCR: SARS Coronavirus 2 by RT PCR: NEGATIVE

## 2022-12-13 MED ORDER — ALUM & MAG HYDROXIDE-SIMETH 200-200-20 MG/5ML PO SUSP: 30.0000 mL | ORAL | Status: DC | PRN

## 2022-12-13 MED ORDER — HYDROXYZINE HCL 25 MG PO TABS
25.0000 mg | ORAL_TABLET | Freq: Three times a day (TID) | ORAL | Status: DC | PRN
  Administered 2022-12-13: 25 mg via ORAL
  Filled 2022-12-13: qty 1

## 2022-12-13 MED ORDER — MORPHINE SULFATE (PF) 4 MG/ML IV SOLN
4.0000 mg | Freq: Once | INTRAVENOUS | Status: AC
  Administered 2022-12-13: 4 mg via INTRAMUSCULAR
  Filled 2022-12-13: qty 1

## 2022-12-13 MED ORDER — NICOTINE 14 MG/24HR TD PT24
14.0000 mg | MEDICATED_PATCH | Freq: Once | TRANSDERMAL | Status: AC
  Administered 2022-12-13: 14 mg via TRANSDERMAL
  Filled 2022-12-13: qty 1

## 2022-12-13 MED ORDER — GABAPENTIN 400 MG PO CAPS
800.0000 mg | ORAL_CAPSULE | Freq: Three times a day (TID) | ORAL | Status: DC
  Administered 2022-12-13 – 2022-12-14 (×2): 800 mg via ORAL
  Filled 2022-12-13 (×2): qty 2

## 2022-12-13 MED ORDER — MAGNESIUM HYDROXIDE 400 MG/5ML PO SUSP: 30.0000 mL | Freq: Every day | ORAL | Status: DC | PRN

## 2022-12-13 NOTE — ED Provider Notes (Signed)
Emergency Medicine Observation Re-evaluation Note  Cole [name] is a 42 y.o. male, seen on rounds today.  Pt initially presented to the ED for complaints of V70.1 Currently, the patient is in no distress.  Physical Exam  BP 129/69 (BP Location: Right Arm)   Pulse 79   Temp (!) 97.5 F (36.4 C)   Resp 18   Ht 5\' 9"  (1.753 m)   Wt 83.9 kg   SpO2 97%   BMI 27.32 kg/m  Physical Exam General: Calm Neuro: No overt abnormalities Psych: Calm, participating in psych eval  ED Course / MDM  EKG:   I have reviewed the labs performed to date as well as medications administered while in observation.  Recent changes in the last 24 hours include acceptance to behavioral health.  Plan  Current plan is for transfer to behavioral health.    Gerhard Munch, MD 12/13/22 1054
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
## 2022-12-13 NOTE — ED Notes (Signed)
Pt is upset and reports his fingers hurt.  Per chart review, Pt had a broken finger on 4/11.  No splint or buddy taping noted to fingers.  When asked what type of intervention was done on 4/11, Pt sts he was not given a splint.  When asked about buddy taping, Pt stopped answering questions.  This writer reassured Pt she was trying to help him, but Pt just stared forward.

## 2022-12-13 NOTE — BH Assessment (Signed)
Clinician messaged Jeffery D. Sappelt, RN: "Hey. It's Trey with TTS. Is the pt able to engage in the assessment, if so the pt will need to be placed in a private room. Is the pt under IVC? Also is the pt medically cleared?"  Clinician awaiting response.    Quintavia Rogstad D Samaa Ueda, MS, LCMHC, CRC Triage Specialist [phone]  

## 2022-12-13 NOTE — ED Notes (Signed)
With a Provider
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
## 2022-12-13 NOTE — ED Notes (Signed)
Pt A&O x 4, no distress noted, sleeping at present.  Monitoring for safety. 
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
## 2022-12-13 NOTE — ED Notes (Signed)
Pt sleeping at present, no distress noted.  Monitoring for safety. 

## 2022-12-13 NOTE — ED Notes (Signed)
TTS Consult in process at this time 

## 2022-12-13 NOTE — ED Notes (Signed)
Pt was calm and cooperative at discharge.  All belongings and paperwork given to [employer].

## 2022-12-13 NOTE — ED Provider Notes (Signed)
[facility] Urgent Care Continuous Assessment Admission H&P  Date: 12/13/22 Patient Name: Cole [name] MRN: [id number] Chief Complaint: SI with plan to cut wrists  Diagnoses:  Final diagnoses:  None    HPI: Cole [name] is a 42 y.o. male patient admitted to [facility] ED after presenting voluntary, unaccompanied with complaints of suicidal ideation with no plan triggered by homelessness and being unemployed.   Patient was transferred from [facility] to [facility] due to having SI with plan to cut wrists to end his life.  He reports stressors including being on home did for the past 2 months, no source of income, and the pressure to pay child support for his 82 year old daughter.  He filed for disability but that is still ongoing.  He currently is having SI with plan which has been present for the past 3 days.  He currently denies HI, AVH.  He occasionally has feelings of paranoia.  Other than low mood, he denies other symptoms of depression.  He reports being diagnosed with anxiety, MDD, bipolar disorder.  He currently is taking gabapentin and Xanax.  He is also taking Suboxone.  He previously was prescribed Haldol which she took as needed when his AVH symptoms worsened.  He sees a Therapist, sports at [employer].  He reports being hospitalized in a psychiatric facility a year ago for SI.  He reports self-harm behavior of hitting his elbow and head to harm himself.   Total Time spent with patient: 45 minutes  Musculoskeletal  Strength & Muscle Tone: within normal limits Gait & Station: normal Patient leans: N/A  Psychiatric Specialty Exam  Presentation General Appearance:  Appropriate for Environment  Eye Contact: Minimal  Speech: Clear and Coherent  Speech Volume: Normal  Handedness: Right   Mood and Affect  Mood: Dysphoric  Affect: Congruent   Thought Process  Thought Processes: Coherent  Descriptions of Associations:Intact  Orientation:Full (Time,  Place and Person)  Thought Content:Logical  Diagnosis of Schizophrenia or Schizoaffective disorder in past: No   Hallucinations:Hallucinations: None Description of Auditory Hallucinations: States he is hearing voices Description of Visual Hallucinations: Seeing big ol' shadow  Ideas of Reference:None  Suicidal Thoughts:Suicidal Thoughts: Yes, Active SI Active Intent and/or Plan: With Intent; With Plan  Homicidal Thoughts:Homicidal Thoughts: No HI Passive Intent and/or Plan: Without Plan; Without Intent; Without Means to Carry Out   Sensorium  Memory: Immediate Good; Recent Good; Remote Good  Judgment: Impaired  Insight: Fair   Chartered certified accountant: Fair  Attention Span: Fair  Recall: Jennelle Human of Knowledge: Fair  Language: Good   Psychomotor Activity  Psychomotor Activity: Psychomotor Activity: Normal   Assets  Assets: Desire for Improvement; Resilience   Sleep  Sleep: Sleep: Fair   Nutritional Assessment (For OBS and FBC admissions only) Has the patient had a weight loss or gain of 10 pounds or more in the last 3 months?: No Has the patient had a decrease in food intake/or appetite?: No Does the patient have dental problems?: No Does the patient have eating habits or behaviors that may be indicators of an eating disorder including binging or inducing vomiting?: No Has the patient recently lost weight without trying?: 0 Has the patient been eating poorly because of a decreased appetite?: 0 Malnutrition Screening Tool Score: 0    Physical Exam  General: Pleasant, well-appearing male. No acute distress. Pulmonary: Normal effort. No wheezing or rales. Skin: No obvious rash or lesions. Neuro: A&Ox3.No focal deficit.   Review of Systems  Constitutional: Negative.  Negative for chills, fever and weight loss.  Respiratory: Negative.    Cardiovascular: Negative.   Gastrointestinal:  Negative for constipation, diarrhea, nausea  and vomiting.  Musculoskeletal: Positive headache.   Skin: Negative.   Neurological: Negative.  Negative for tingling.     Blood pressure (!) 142/76, pulse 61, temperature 98.5 F (36.9 C), temperature source Oral, resp. rate 18, SpO2 98 %. There is no height or weight on file to calculate BMI.  Past Psychiatric History:  Dx: Anxiety, MDD, bipolar disorder Medications: Gabapentin 800 mg 3 times daily, Xanax 1 mg 3 times daily as needed [facility] Hospitalizations: 1 year ago for SI History of SI: Reports twice in the past History of self harm: Reports hitting his elbow and head once    Is the patient at risk to self? Yes  Has the patient been a risk to self in the past 6 months? Yes .    Has the patient been a risk to self within the distant past? Yes   Is the patient a risk to others? No   Has the patient been a risk to others in the past 6 months? No   Has the patient been a risk to others within the distant past? No   Past Medical History: Asthma, chronic back pain, hepatitis C, peripheral neuropathy  Family History: Hyperlipidemia, diabetes  Social History:  Living: On home for the past 2 months, intermittently goes to homeless shelters Job: Unemployed for the past 3 weeks, 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, applying for disability Support: Network engineer Smoking: Smokes 1 pack a day since 42 years old Alcohol: Drinks 3-4 beers daily since 42 years old, denies withdrawal symptoms of tremor and seizures, most recent drink was yesterday Illicit drugs:  -Uses a gram of cocaine 2-3 times weekly since he was 42 years old -Daily marijuana use, recently increased the amount to quarter daily for the past 4 months, all his life  Last Labs:  Admission on 12/12/2022, Discharged on 12/13/2022  Component Date Value Ref Range Status   Sodium 12/12/2022 139  135 - 145 mmol/L Final   Potassium 12/12/2022 3.5  3.5 - 5.1  mmol/L Final   Chloride 12/12/2022 103  98 - 111 mmol/L Final   CO2 12/12/2022 27  22 - 32 mmol/L Final   Glucose, Bld 12/12/2022 90  70 - 99 mg/dL Final   Glucose reference range applies only to samples taken after fasting for at least 8 hours.   BUN 12/12/2022 14  6 - 20 mg/dL Final   Creatinine, Ser 12/12/2022 0.93  0.61 - 1.24 mg/dL Final   Calcium 09/81/1914 9.0  8.9 - 10.3 mg/dL Final   Total Protein 78/29/5621 7.9  6.5 - 8.1 g/dL Final   Albumin 30/86/5784 4.1  3.5 - 5.0 g/dL Final   AST 69/62/9528 18  15 - 41 U/L Final   ALT 12/12/2022 18  0 - 44 U/L Final   Alkaline Phosphatase 12/12/2022 52  38 - 126 U/L Final   Total Bilirubin 12/12/2022 1.2  0.3 - 1.2 mg/dL Final   GFR, Estimated 12/12/2022 >60  >60 mL/min Final   Comment: (NOTE) Calculated using the CKD-EPI Creatinine Equation (2021)    Anion gap 12/12/2022 9  5 - 15 Final   Performed at [facility], [address] [place], [place] [address]   Alcohol, Ethyl (B) 12/12/2022 <10  <10 mg/dL Final   Comment: (NOTE) Lowest detectable limit  for serum alcohol is 10 mg/dL.  For medical purposes only. Performed at [facility], [address], [place], [place] [address]    Opiates 12/12/2022 NONE DETECTED  NONE DETECTED Final   Cocaine 12/12/2022 POSITIVE (A)  NONE DETECTED Final   Benzodiazepines 12/12/2022 NONE DETECTED  NONE DETECTED Final   Amphetamines 12/12/2022 NONE DETECTED  NONE DETECTED Final   Tetrahydrocannabinol 12/12/2022 POSITIVE (A)  NONE DETECTED Final   Barbiturates 12/12/2022 NONE DETECTED  NONE DETECTED Final   Comment: (NOTE) DRUG SCREEN FOR MEDICAL PURPOSES ONLY.  IF CONFIRMATION IS NEEDED FOR ANY PURPOSE, NOTIFY LAB WITHIN 5 DAYS.  LOWEST DETECTABLE LIMITS FOR URINE DRUG SCREEN Drug Class                     Cutoff (ng/mL) Amphetamine and metabolites    1000 Barbiturate and metabolites    200 Benzodiazepine                 200 Opiates and metabolites        300 Cocaine and metabolites         300 THC                            50 Performed at [facility], [address], [place], [place] [address]    WBC 12/12/2022 7.5  4.0 - 10.5 K/uL Final   RBC 12/12/2022 5.18  4.22 - 5.81 MIL/uL Final   Hemoglobin 12/12/2022 13.0  13.0 - 17.0 g/dL Final   HCT 41/28/7867 39.8  39.0 - 52.0 % Final   MCV 12/12/2022 76.8 (L)  80.0 - 100.0 fL Final   MCH 12/12/2022 25.1 (L)  26.0 - 34.0 pg Final   MCHC 12/12/2022 32.7  30.0 - 36.0 g/dL Final   RDW 67/20/9470 13.5  11.5 - 15.5 % Final   Platelets 12/12/2022 178  150 - 400 K/uL Final   nRBC 12/12/2022 0.0  0.0 - 0.2 % Final   Neutrophils Relative % 12/12/2022 53  % Final   Neutro Abs 12/12/2022 3.9  1.7 - 7.7 K/uL Final   Lymphocytes Relative 12/12/2022 37  % Final   Lymphs Abs 12/12/2022 2.8  0.7 - 4.0 K/uL Final   Monocytes Relative 12/12/2022 8  % Final   Monocytes Absolute 12/12/2022 0.6  0.1 - 1.0 K/uL Final   Eosinophils Relative 12/12/2022 2  % Final   Eosinophils Absolute 12/12/2022 0.2  0.0 - 0.5 K/uL Final   Basophils Relative 12/12/2022 0  % Final   Basophils Absolute 12/12/2022 0.0  0.0 - 0.1 K/uL Final   Immature Granulocytes 12/12/2022 0  % Final   Abs Immature Granulocytes 12/12/2022 0.02  0.00 - 0.07 K/uL Final   Performed at [facility], [address], [place], [place] [address]  Admission on 08/27/2022, Discharged on 08/27/2022  Component Date Value Ref Range Status   Neisseria Gonorrhea 08/27/2022 Negative   Final   Chlamydia 08/27/2022 Negative   Final   Comment 08/27/2022 Normal Reference Ranger Chlamydia - Negative   Final   Comment 08/27/2022 Normal Reference Range Neisseria Gonorrhea - Negative   Final   RPR Ser Ql 08/27/2022 NON REACTIVE  NON REACTIVE Final   Performed at [facility], [address], [place], [place] [address]   Color, Urine 08/27/2022 YELLOW  YELLOW Final   APPearance 08/27/2022 CLEAR  CLEAR Final   Specific Gravity, Urine 08/27/2022 1.020  1.005 - 1.030  Final   pH 08/27/2022 7.0  5.0 -  8.0 Final   Glucose, UA 08/27/2022 NEGATIVE  NEGATIVE mg/dL Final   Hgb urine dipstick 08/27/2022 NEGATIVE  NEGATIVE Final   Bilirubin Urine 08/27/2022 NEGATIVE  NEGATIVE Final   Ketones, ur 08/27/2022 NEGATIVE  NEGATIVE mg/dL Final   Protein, ur 16/05/9603 NEGATIVE  NEGATIVE mg/dL Final   Nitrite 54/04/8118 NEGATIVE  NEGATIVE Final   Leukocytes,Ua 08/27/2022 NEGATIVE  NEGATIVE Final   Performed at [facility], [address], [place], [place] [address]   HIV Screen 4th Generation wRfx 08/27/2022 Non Reactive  Non Reactive Final   Performed at [facility], [address] [place], [place] [address]    Allergies: Aleve [naproxen sodium], Aspirin, Ibuprofen, Seroquel [quetiapine fumerate], Tramadol, and Tylenol [acetaminophen]  Medications:  Facility Ordered Medications  Medication   [COMPLETED] morphine (PF) 4 MG/ML injection 4 mg   alum & mag hydroxide-simeth (MAALOX/MYLANTA) 200-200-20 MG/5ML suspension 30 mL   magnesium hydroxide (MILK OF MAGNESIA) suspension 30 mL   PTA Medications  Medication Sig   haloperidol (HALDOL) 10 MG tablet Take 1 tablet (10 mg total) by mouth at bedtime. (Patient not taking: Reported on 12/13/2022)   methocarbamol (ROBAXIN) 500 MG tablet Take 1 tablet (500 mg total) by mouth 2 (two) times daily. (Patient not taking: Reported on 03/25/2021)   ALPRAZolam (XANAX) 1 MG tablet Take 1 mg by mouth 3 (three) times daily as needed for anxiety.   gabapentin (NEURONTIN) 800 MG tablet Take 800 mg by mouth 3 (three) times daily.   hydrocortisone 2.5 % lotion Apply topically 2 (two) times daily. (Patient not taking: Reported on 12/13/2022)   amoxicillin (AMOXIL) 500 MG capsule Take 2 capsules (1,000 mg total) by mouth 2 (two) times daily.   buprenorphine-naloxone (SUBOXONE) 8-2 mg SUBL SL tablet Place 1 tablet under the tongue 2 (two) times daily.   baclofen (LIORESAL) 10 MG tablet Take 10 mg by mouth 3 (three) times daily as needed for muscle spasms.    Medical  Decision Making  Admit patient for observation.    Recommendations  Based on my evaluation the patient does not appear to have an emergency medical condition.  Lance Muss, MD 12/13/22  4:28 PM

## 2022-12-13 NOTE — ED Notes (Signed)
Telemonitor personnel called to confirm they do have "eyes" on the Pt in the treatment room at this time.

## 2022-12-13 NOTE — ED Notes (Signed)
Pt provided drink, graham crackers, and peanut butter per request.    Discussed splints and buddy taping w/ Pt.  Unfortunately, all of our splints have metal and cannot be used.  Pt is refusing fingers to be buddy taped.

## 2022-12-13 NOTE — ED Notes (Signed)
Pts left index finger buddy taped to 3rd digit and ice pack applied to help with pain relief and swelling. Pt provided snack and beverage at this time.

## 2022-12-14 ENCOUNTER — Telehealth: Payer: Self-pay | Admitting: Orthopedic Surgery

## 2022-12-14 ENCOUNTER — Ambulatory Visit (INDEPENDENT_AMBULATORY_CARE_PROVIDER_SITE_OTHER): Payer: Medicaid Other | Admitting: Orthopedic Surgery

## 2022-12-14 ENCOUNTER — Other Ambulatory Visit (INDEPENDENT_AMBULATORY_CARE_PROVIDER_SITE_OTHER): Payer: Medicaid Other

## 2022-12-14 ENCOUNTER — Encounter: Payer: Self-pay | Admitting: Orthopedic Surgery

## 2022-12-14 VITALS — BP 154/94 | HR 101 | Ht 69.0 in | Wt 188.0 lb

## 2022-12-14 DIAGNOSIS — S62621A Displaced fracture of medial phalanx of left index finger, initial encounter for closed fracture: Secondary | ICD-10-CM

## 2022-12-14 DIAGNOSIS — M25531 Pain in right wrist: Secondary | ICD-10-CM | POA: Diagnosis not present

## 2022-12-14 DIAGNOSIS — M25521 Pain in right elbow: Secondary | ICD-10-CM | POA: Diagnosis not present

## 2022-12-14 MED ORDER — OXYCODONE HCL 5 MG PO TABS: 5.0000 mg | ORAL_TABLET | Freq: Four times a day (QID) | ORAL | 0 refills | Status: AC | PRN

## 2022-12-14 MED ORDER — NICOTINE 14 MG/24HR TD PT24
MEDICATED_PATCH | TRANSDERMAL | Status: AC
  Administered 2022-12-14: 14 mg
  Filled 2022-12-14: qty 1

## 2022-12-14 NOTE — ED Provider Notes (Signed)
FBC/OBS ASAP Discharge Summary  Date and Time: 12/14/2022 9:11 AM  Name: Cole [name]  MRN:  [id number]   Discharge Diagnoses:  Final diagnoses:  None    Subjective: Patient was seen this morning. Patient denied SI, HI, and AVH. He slept well overnight and has good appetite. He reports feeling better today and feels his medications are helpful. He is requesting discharge as he has an orthopaedic appointment today.   Stay Summary: The patient was evaluated each day by a clinical provider to ascertain response to treatment. Improvement was noted by the patient's report of decreasing symptoms, improved sleep and appetite, affect, medication tolerance, behavior, and participation in unit programming.  Patient was asked each day to complete a self inventory noting mood, mental status, pain, new symptoms, anxiety and concerns.   Patient responded well to medication and being in a therapeutic and supportive environment. Positive and appropriate behavior was noted and the patient was motivated for recovery. The patient worked closely with the treatment team and case manager to develop a discharge plan with appropriate goals. Coping skills, problem solving as well as relaxation therapies were also part of the unit programming.   Total Time spent with patient: 45 minutes   Past Psychiatric History:  Dx: Anxiety, MDD, bipolar disorder Medications: Gabapentin 800 mg 3 times daily, Xanax 1 mg 3 times daily as needed [facility] Hospitalizations: 1 year ago for SI History of SI: Reports twice in the past History of self harm: Reports hitting his elbow and head once Past Medical History: Asthma, chronic back pain, hepatitis C, peripheral neuropathy   Family History: Hyperlipidemia, diabetes   Social History:  Living: On home for the past 2 months, intermittently goes to homeless shelters Job: Unemployed for the past 3 weeks,  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, applying for disability Support: Network engineer Smoking: Smokes 1 pack a day since 42 years old Alcohol: Drinks 3-4 beers daily since 42 years old, denies withdrawal symptoms of tremor and seizures, most recent drink was yesterday Illicit drugs:  -Uses a gram of cocaine 2-3 times weekly since he was 42 years old -Daily marijuana use, recently increased the amount to quarter daily for the past 4 months, all his life Tobacco Cessation:  A prescription for an FDA-approved tobacco cessation medication provided at discharge  Current Medications:  Current Facility-Administered Medications  Medication Dose Route Frequency Provider Last Rate Last Admin   alum & mag hydroxide-simeth (MAALOX/MYLANTA) 200-200-20 MG/5ML suspension 30 mL  30 mL Oral Q4H PRN Nelly Rout, MD       gabapentin (NEURONTIN) capsule 800 mg  800 mg Oral TID Kizzie Ide B, MD   800 mg at 12/13/22 2130   hydrOXYzine (ATARAX) tablet 25 mg  25 mg Oral TID PRN Kizzie Ide B, MD   25 mg at 12/13/22 2130   magnesium hydroxide (MILK OF MAGNESIA) suspension 30 mL  30 mL Oral Daily PRN Nelly Rout, MD       Current Outpatient Medications  Medication Sig Dispense Refill   ALPRAZolam (XANAX) 1 MG tablet Take 1 mg by mouth 3 (three) times daily as needed for anxiety.     amoxicillin (AMOXIL) 500 MG capsule Take 2 capsules (1,000 mg total) by mouth 2 (two) times daily. 40 capsule 0   baclofen (LIORESAL) 10 MG tablet Take 10 mg by mouth 3 (three) times daily as needed for muscle spasms.     buprenorphine-naloxone (SUBOXONE) 8-2 mg SUBL SL tablet  Place 1 tablet under the tongue 2 (two) times daily.     gabapentin (NEURONTIN) 800 MG tablet Take 800 mg by mouth 3 (three) times daily.     haloperidol (HALDOL) 10 MG tablet Take 1 tablet (10 mg total) by mouth at bedtime. (Patient not taking: Reported on 12/13/2022) 30 tablet 0   hydrocortisone 2.5 % lotion Apply topically 2 (two) times daily. (Patient not  taking: Reported on 12/13/2022) 59 mL 0   methocarbamol (ROBAXIN) 500 MG tablet Take 1 tablet (500 mg total) by mouth 2 (two) times daily. (Patient not taking: Reported on 03/25/2021) 10 tablet 0    PTA Medications:  Facility Ordered Medications  Medication   [COMPLETED] morphine (PF) 4 MG/ML injection 4 mg   alum & mag hydroxide-simeth (MAALOX/MYLANTA) 200-200-20 MG/5ML suspension 30 mL   magnesium hydroxide (MILK OF MAGNESIA) suspension 30 mL   [COMPLETED] nicotine (NICODERM CQ - dosed in mg/24 hours) patch 14 mg   hydrOXYzine (ATARAX) tablet 25 mg   gabapentin (NEURONTIN) capsule 800 mg   PTA Medications  Medication Sig   haloperidol (HALDOL) 10 MG tablet Take 1 tablet (10 mg total) by mouth at bedtime. (Patient not taking: Reported on 12/13/2022)   methocarbamol (ROBAXIN) 500 MG tablet Take 1 tablet (500 mg total) by mouth 2 (two) times daily. (Patient not taking: Reported on 03/25/2021)   ALPRAZolam (XANAX) 1 MG tablet Take 1 mg by mouth 3 (three) times daily as needed for anxiety.   gabapentin (NEURONTIN) 800 MG tablet Take 800 mg by mouth 3 (three) times daily.   hydrocortisone 2.5 % lotion Apply topically 2 (two) times daily. (Patient not taking: Reported on 12/13/2022)   amoxicillin (AMOXIL) 500 MG capsule Take 2 capsules (1,000 mg total) by mouth 2 (two) times daily.   buprenorphine-naloxone (SUBOXONE) 8-2 mg SUBL SL tablet Place 1 tablet under the tongue 2 (two) times daily.   baclofen (LIORESAL) 10 MG tablet Take 10 mg by mouth 3 (three) times daily as needed for muscle spasms.        No data to display          Flowsheet Row ED from 12/13/2022 in [facility] ED from 12/12/2022 in [facility] Emergency Department at [facility] ED from 12/09/2022 in [facility] Emergency Department at [facility]  C-SSRS RISK CATEGORY High Risk High Risk No Risk       Musculoskeletal  Strength & Muscle Tone: within normal limits Gait & Station:  normal Patient leans: N/A  Psychiatric Specialty Exam  General Appearance: appears at stated age, casually dressed and groomed   Behavior: irritable  Psychomotor Activity: no psychomotor agitation or retardation noted   Eye Contact: fair  Speech: normal amount, tone, volume and fluency    Mood: euthymic  Affect: congruent, pleasant and interactive   Thought Process: linear, goal directed, no circumstantial or tangential thought process noted, no racing thoughts or flight of ideas  Descriptions of Associations: intact   Thought Content Hallucinations: denies AH, VH , does not appear responding to stimuli  Delusions: no paranoia, delusions of control, grandeur, ideas of reference, thought broadcasting, and magical thinking  Suicidal Thoughts: denies SI, intention, plan  Homicidal Thoughts: denies HI, intention, plan   Alertness/Orientation: alert and fully oriented   Insight: fair Judgment: fair  Memory: intact   Executive Functions  Concentration: intact  Attention Span: fair  Recall: intact  Fund of Knowledge: fair     Nutritional Assessment (For OBS and  FBC admissions only) Has the patient had a weight loss or gain of 10 pounds or more in the last 3 months?: No Has the patient had a decrease in food intake/or appetite?: No Does the patient have dental problems?: No Does the patient have eating habits or behaviors that may be indicators of an eating disorder including binging or inducing vomiting?: No Has the patient recently lost weight without trying?: 0 Has the patient been eating poorly because of a decreased appetite?: 0 Malnutrition Screening Tool Score: 0    Physical Exam   Physical Exam  General: Pleasant, well-appearing male. No acute distress. Pulmonary: Normal effort. No wheezing or rales. Skin: No obvious rash or lesions. Neuro: A&Ox3.No focal deficit.   Review of Systems  Constitutional: Negative.  Negative for chills, fever and weight loss.   Respiratory: Negative.    Cardiovascular: Negative.   Gastrointestinal:  Negative for constipation, diarrhea, nausea and vomiting.  Musculoskeletal: Negative.   Skin: Negative.   Neurological: Negative.  Negative for tingling.     Blood pressure 108/76, pulse 60, temperature 97.9 F (36.6 C), temperature source Oral, resp. rate 18, SpO2 100 %. There is no height or weight on file to calculate BMI.  Demographic Factors:  Male and Unemployed  Loss Factors: Legal issues  Historical Factors: Prior suicide attempts  Risk Reduction Factors:   NA  Continued Clinical Symptoms:  Alcohol/Substance Abuse/Dependencies  Cognitive Features That Contribute To Risk:  None    Suicide Risk:  Mild:  Suicidal ideation of limited frequency, intensity, duration, and specificity.  There are no identifiable plans, no associated intent, mild dysphoria and related symptoms, good self-control (both objective and subjective assessment), few other risk factors, and identifiable protective factors, including available and accessible social support.  Plan Of Care/Follow-up recommendations:  Activity:  moderate Diet:  normal Tests:  none  Disposition: community  Lance Muss, MD 12/14/2022, 9:11 AM

## 2022-12-14 NOTE — Discharge Instructions (Addendum)
Follow-up recommendations:  Activity:  Normal, as tolerated Diet:  Per PCP recommendation  Patient is instructed prior to discharge to: Take all medications as prescribed by her mental healthcare provider. Report any adverse effects and/or reactions from the medicines to her outpatient provider promptly. Patient has been instructed & cautioned: To not engage in alcohol and or illegal drug use while on prescription medicines.  In the event of worsening symptoms, patient is instructed to call the crisis hotline at 988, 911 and or go to the nearest ED for appropriate evaluation and treatment of symptoms. To follow-up with her primary care provider for your other medical issues, concerns and or health care needs.   Patient has an orthopedic surgery appointment on 4/16 at 3:15 pm today at [facility].

## 2022-12-14 NOTE — ED Notes (Signed)
Safe transport arranged for 2pm to pick patient up and take him to [facility] in [place].

## 2022-12-14 NOTE — Telephone Encounter (Signed)
Patient receiving suboxone The pharmacy wants to clarify you are aware before filling the Oxycodone [facility] [place]

## 2022-12-14 NOTE — ED Notes (Signed)
Pt sleeping at present, no distress noted.  Monitoring for safety. 

## 2022-12-14 NOTE — Telephone Encounter (Signed)
Dr. Dallas Schimke pt - pt was just here, he has presented to the office stating that the pharmacy told him to have clinical call them regarding his prescription.  [facility] on [address]

## 2022-12-14 NOTE — Telephone Encounter (Signed)
I spoke to patient told him we will get in touch with pharmacy He is in the lobby refusing to leave  States he does not have a phone
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
## 2022-12-15 NOTE — Telephone Encounter (Signed)
Dr. Dallas Schimke spoke with pharmacist regarding this pt.

## 2022-12-26 ENCOUNTER — Emergency Department (HOSPITAL_COMMUNITY)
Admission: EM | Admit: 2022-12-26 | Discharge: 2022-12-26 | Disposition: A | Discharge status: Home / Self Care | Payer: Medicaid Other | Attending: Emergency Medicine | Admitting: Emergency Medicine

## 2022-12-26 ENCOUNTER — Encounter (HOSPITAL_COMMUNITY): Payer: Self-pay | Admitting: Emergency Medicine

## 2022-12-26 DIAGNOSIS — J45909 Unspecified asthma, uncomplicated: Secondary | ICD-10-CM | POA: Diagnosis not present

## 2022-12-26 DIAGNOSIS — X58XXXD Exposure to other specified factors, subsequent encounter: Secondary | ICD-10-CM | POA: Diagnosis not present

## 2022-12-26 DIAGNOSIS — S62601D Fracture of unspecified phalanx of left index finger, subsequent encounter for fracture with routine healing: Secondary | ICD-10-CM | POA: Diagnosis present

## 2022-12-26 MED ORDER — OXYCODONE HCL 5 MG PO TABS: 5.0000 mg | ORAL_TABLET | ORAL | 0 refills | Status: DC | PRN

## 2022-12-26 MED ORDER — OXYCODONE HCL 5 MG PO TABS
5.0000 mg | ORAL_TABLET | Freq: Once | ORAL | Status: AC
  Administered 2022-12-26: 5 mg via ORAL
  Filled 2022-12-26: qty 1
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
## 2022-12-26 NOTE — ED Notes (Signed)
Pt resting quietly awaiting for provider

## 2022-12-26 NOTE — ED Triage Notes (Signed)
Pt here from home with c/o swollen pointer finger on his left hand , has been seen for it already and has follow up with othro on Tuesday but has run out of pain meds

## 2022-12-26 NOTE — ED Notes (Signed)
Swollen left pointer finger.  Warm and cap refill < 3 secs,  Here for pain control

## 2022-12-26 NOTE — Discharge Instructions (Signed)
You were evaluated in the Emergency Department and after careful evaluation, we did not find any emergent condition requiring admission or further testing in the hospital.  Your exam/testing today is overall reassuring.  Keep your follow-up with the orthopedic specialists.  Use the oxycodone sparingly as needed for pain.  Please return to the Emergency Department if you experience any worsening of your condition.   Thank you for allowing [place] to be a part of your care.

## 2022-12-27 ENCOUNTER — Encounter (HOSPITAL_COMMUNITY): Payer: Self-pay

## 2022-12-27 ENCOUNTER — Other Ambulatory Visit: Payer: Self-pay

## 2022-12-27 ENCOUNTER — Emergency Department (HOSPITAL_COMMUNITY)
Admission: EM | Admit: 2022-12-27 | Discharge: 2022-12-27 | Disposition: A | Discharge status: Home / Self Care | Payer: Medicaid Other | Attending: Emergency Medicine | Admitting: Emergency Medicine

## 2022-12-27 DIAGNOSIS — X58XXXD Exposure to other specified factors, subsequent encounter: Secondary | ICD-10-CM | POA: Insufficient documentation

## 2022-12-27 DIAGNOSIS — S6992XD Unspecified injury of left wrist, hand and finger(s), subsequent encounter: Secondary | ICD-10-CM

## 2022-12-27 MED ORDER — OXYCODONE HCL 5 MG PO TABS
5.0000 mg | ORAL_TABLET | Freq: Once | ORAL | Status: AC
  Administered 2022-12-27: 5 mg via ORAL
  Filled 2022-12-27: qty 1

## 2022-12-27 MED ORDER — OXYCODONE HCL 5 MG PO TABS: 5.0000 mg | ORAL_TABLET | ORAL | 0 refills | Status: DC | PRN

## 2022-12-27 NOTE — ED Triage Notes (Signed)
Pt was seen yesterday for left pointer finger. MD rx med to wrong pharmacy. Pt is here to get resent to correct pharmacy. Pharmacy has been updated just now to [facility]

## 2022-12-27 NOTE — ED Notes (Signed)
Pt provided sandwich and soda   

## 2022-12-27 NOTE — Discharge Instructions (Addendum)
You were seen tonight requesting your prescription be sent to a different pharmacy.  This was sent to the pharmacy as requested.  Please pick up the prescription tomorrow.  Follow-up with orthopedics for further evaluation and management as needed

## 2022-12-27 NOTE — ED Provider Notes (Signed)
[name] EMERGENCY DEPARTMENT AT [facility] Provider Note   CSN: [id number] Arrival date & time: 12/27/22  2022     History  Chief Complaint  Patient presents with   Finger Injury    Cole [name] is a 42 y.o. male.  Patient presents the emergency permit requesting prescription pharmacy change.  Patient was seen here last night for an injury to the left index finger.  Patient was prescribed oxycodone which was unfortunately sent to the wrong pharmacy.  Patient is here today requesting it being sent to the correct pharmacy.  HPI     Home Medications Prior to Admission medications   Medication Sig Start Date End Date Taking? Authorizing Provider  ALPRAZolam Prudy Feeler) 1 MG tablet Take 1 mg by mouth 3 (three) times daily as needed for anxiety. 03/12/21   [provider]  amoxicillin (AMOXIL) 500 MG capsule Take 2 capsules (1,000 mg total) by mouth 2 (two) times daily. 11/19/22   Dione Booze, MD  baclofen (LIORESAL) 10 MG tablet Take 10 mg by mouth 3 (three) times daily as needed for muscle spasms. 11/16/22   [provider]  buprenorphine-naloxone (SUBOXONE) 8-2 mg SUBL SL tablet Place 1 tablet under the tongue 2 (two) times daily. 11/20/22   [provider]  gabapentin (NEURONTIN) 800 MG tablet Take 800 mg by mouth 3 (three) times daily. 04/24/21   [provider]  oxyCODONE (ROXICODONE) 5 MG immediate release tablet Take 1 tablet (5 mg total) by mouth every 4 (four) hours as needed for severe pain. 12/27/22   Darrick Grinder, PA-C      Allergies    Aleve [naproxen sodium], Aspirin, Ibuprofen, Seroquel [quetiapine fumerate], Tramadol, and Tylenol [acetaminophen]    Review of Systems   Review of Systems  Physical Exam Updated Vital Signs BP 127/74 (BP Location: Right Arm)   Pulse (!) 56   Temp 98.7 F (37.1 C) (Oral)   Resp 18   SpO2 100%  Physical Exam HENT:     Head: Normocephalic and atraumatic.  Eyes:     Pupils: Pupils  are equal, round, and reactive to light.  Pulmonary:     Effort: Pulmonary effort is normal. No respiratory distress.  Musculoskeletal:        General: Swelling present. No signs of injury.     Cervical back: Normal range of motion.     Comments: Left index finger with swelling.  Patient states has been consistent since the initial injury.  No erythema or drainage  Skin:    General: Skin is dry.  Neurological:     Mental Status: He is alert.  Psychiatric:        Speech: Speech normal.        Behavior: Behavior normal.     ED Results / Procedures / Treatments   Labs (all labs ordered are listed, but only abnormal results are displayed) Labs Reviewed - No data to display  EKG None  Radiology No results found.  Procedures Procedures    Medications Ordered in ED Medications  oxyCODONE (Oxy IR/ROXICODONE) immediate release tablet 5 mg (has no administration in time range)    ED Course/ Medical Decision Making/ A&P                             Medical Decision Making  Patient with left index finger injury.  Requesting his prescriptions be sent to a different pharmacy.  This was done.  I did order the patient 1 dose of oxycodone tonight since the patient will not be able to get the prescription until tomorrow.  Patient will follow-up as needed with hand surgery.  Discharge home.        Final Clinical Impression(s) / ED Diagnoses Final diagnoses:  Injury of finger of left hand, subsequent encounter    Rx / DC Orders ED Discharge Orders          Ordered    oxyCODONE (ROXICODONE) 5 MG immediate release tablet  Every 4 hours PRN        12/27/22 2144              Cole [name] 12/27/22 2145    [name], MD 12/31/22 2115

## 2022-12-28 ENCOUNTER — Encounter: Payer: Self-pay | Admitting: Orthopedic Surgery

## 2022-12-28 ENCOUNTER — Ambulatory Visit (INDEPENDENT_AMBULATORY_CARE_PROVIDER_SITE_OTHER): Payer: Medicaid Other | Admitting: Orthopedic Surgery

## 2022-12-28 ENCOUNTER — Other Ambulatory Visit (INDEPENDENT_AMBULATORY_CARE_PROVIDER_SITE_OTHER): Payer: Medicaid Other

## 2022-12-28 DIAGNOSIS — S62621D Displaced fracture of medial phalanx of left index finger, subsequent encounter for fracture with routine healing: Secondary | ICD-10-CM

## 2022-12-28 MED ORDER — OXYCODONE HCL 5 MG PO TABS: 5.0000 mg | ORAL_TABLET | Freq: Four times a day (QID) | ORAL | 0 refills | Status: AC | PRN

## 2022-12-28 NOTE — Patient Instructions (Signed)
Continue to wear the splint on your finger at all times.  You can remove it for hygiene only  Limited prescription for oxycodone was provided today.  This will be the only prescription for this injury.  Follow-up in 2 weeks

## 2022-12-28 NOTE — Progress Notes (Signed)
Return patient Visit  Assessment: Cole [name] is a 42 y.o. male with the following: 1. Displaced fracture of middle phalanx of left index finger, subsequent encounter for closed fracture  Plan: Cole [name] sustained a middle phalanx fracture to the right long finger.  Radiographs are stable.  He continues to have some swelling.  He has not been wearing the splint on a consistent basis.  He was placed into an AlumaFoam splint today.  Advised him to keep this in place, but okay to remove for hygiene.  He states that he is in pain.  He has stopped taking his Suboxone.  He has talked to his physician who prescribes his Suboxone, and it is okay for him to take pain medications.  He is aware of the risks.  I was very up front with him, and advised him that I would provide him with 1 prescription of oxycodone for his current problem.  No additional medications will be provided.  He states his understanding.  I will see him in 2 weeks for further evaluation.   Follow-up: Return in about 2 weeks (around 01/11/2023).  Subjective:  Chief Complaint  Patient presents with   Finger pain    LT index finger DOI 12/08/22    History of Present Illness: Cole [name] is a 42 y.o. male who returns for evaluation of left hand pain.  He was helping a friend move a cast iron stove, 2-3 weeks ago, when it landed on his left hand.  He has an injury of the left index finger, middle phalanx.  He has not been wearing the splint as recommended.  He states he continues to have pain.  He has been to the emergency department twice in the past 2 days, due to pain.  He has been provided with a prescription for oxycodone.  He still has pain in the left index finger.  He was on Suboxone recently, due to a history of heroin abuse.  He has talked to his primary care provider, who states that he can receive narcotic pain medications, provided that he is not taking Suboxone.  He is aware of the risks involved with  not taking Suboxone.  Review of Systems: No fevers or chills No numbness or tingling No chest pain No shortness of breath No bowel or bladder dysfunction No GI distress No headaches     Objective: There were no vitals taken for this visit.  Physical Exam:  General: Alert and oriented. and No acute distress. Gait: Normal gait.  Left hand with diffuse swelling of the index finger.  Tenderness to palpation.  Minimal range of motion as tolerated.  Fingers are warm and well-perfused.   IMAGING: I personally ordered and reviewed the following images  X-rays of the left index finger were obtained in clinic today.  He has a comminuted fracture of the middle phalanx.  Minimally displaced overall.  Does not appear to involve the joints.  No additional injuries noted.  No bony lesions.  Impression: Left index finger, comminuted middle phalanx fracture in stable alignment   New Medications:  No orders of the defined types were placed in this encounter.     Oliver Barre, MD  12/28/2022 2:53 PM

## 2023-01-11 ENCOUNTER — Other Ambulatory Visit (INDEPENDENT_AMBULATORY_CARE_PROVIDER_SITE_OTHER): Payer: Medicaid Other

## 2023-01-11 ENCOUNTER — Encounter: Payer: Self-pay | Admitting: Orthopedic Surgery

## 2023-01-11 ENCOUNTER — Ambulatory Visit (INDEPENDENT_AMBULATORY_CARE_PROVIDER_SITE_OTHER): Payer: Medicaid Other | Admitting: Orthopedic Surgery

## 2023-01-11 ENCOUNTER — Telehealth: Payer: Self-pay | Admitting: Orthopedic Surgery

## 2023-01-11 DIAGNOSIS — S62621D Displaced fracture of medial phalanx of left index finger, subsequent encounter for fracture with routine healing: Secondary | ICD-10-CM | POA: Diagnosis not present

## 2023-01-11 MED ORDER — OXYCODONE HCL 5 MG PO TABS: 5.0000 mg | ORAL_TABLET | Freq: Four times a day (QID) | ORAL | 0 refills | Status: DC | PRN

## 2023-01-11 MED ORDER — OXYCODONE HCL 5 MG PO TABS: 5.0000 mg | ORAL_TABLET | Freq: Four times a day (QID) | ORAL | 0 refills | Status: AC | PRN

## 2023-01-11 NOTE — Addendum Note (Signed)
Addended by: Thane Edu A on: 01/11/2023 03:16 PM   Modules accepted: Orders

## 2023-01-11 NOTE — Progress Notes (Signed)
Return patient Visit  Assessment: Cole [name] is a 42 y.o. male with the following: 1. Displaced fracture of middle phalanx of left index finger, subsequent encounter for closed fracture  Plan: Cole [name] sustained a middle phalanx fracture to the right long finger.  Radiographs remained stable.  He continues to have a lot of swelling in the finger.  He tolerates gentle range of motion at the PIP joint.  Limited motion at the DIP joint.  We replaced the AlumaFoam splint.  Okay for him to remove the splint to work on gentle range of motion.  He has been compliant with the splint.  He has some residual pain.  Last prescription of medication has been provided.  Follow-up in 2 weeks.  Follow-up: Return in about 2 weeks (around 01/25/2023).  Subjective:  Chief Complaint  Patient presents with   Fracture    L index DOI 12/08/22    History of Present Illness: Cole [name] is a 43 y.o. male who returns for evaluation of left hand pain.  He sustained an injury to the left index finger approximately 1 month ago.  He continues to have some pain.  He has been using an AlumaFoam splint.  He notes some swelling.  He still feels as though he needs pain medications.  No numbness or tingling.  Review of Systems: No fevers or chills No numbness or tingling No chest pain No shortness of breath No bowel or bladder dysfunction No GI distress No headaches     Objective: There were no vitals taken for this visit.  Physical Exam:  General: Alert and oriented. and No acute distress. Gait: Normal gait.  Left index finger remains swollen.  Mild tenderness to palpation.  He tolerates gentle range of motion at the PIP joint to approximately 90 degrees.  Limited motion at the DIP joint.  Fingers warm and well-perfused.   IMAGING: I personally ordered and reviewed the following images  X-rays left index finger were obtained in clinic today.  He has comminuted fracture of the middle  phalanx.  There has been no interval displacement.  There does appear to be some interval callus formation.  No intra-articular involvement.  No additional injuries.  Impression: Stable left index finger, comminuted middle phalanx fracture   New Medications:  Meds ordered this encounter  Medications   oxyCODONE (ROXICODONE) 5 MG immediate release tablet    Sig: Take 1 tablet (5 mg total) by mouth every 6 (six) hours as needed for up to 7 days.    Dispense:  15 tablet    Refill:  0      Oliver Barre, MD  01/11/2023 9:15 AM

## 2023-01-11 NOTE — Telephone Encounter (Signed)
Dr. Dallas Schimke pt - spoke w/the patient, he stated that the medication that was sent in today needs to be sent to [facility] in [place].

## 2023-01-24 ENCOUNTER — Emergency Department (HOSPITAL_COMMUNITY)
Admission: EM | Admit: 2023-01-24 | Discharge: 2023-01-24 | Disposition: A | Discharge status: Home / Self Care | Payer: No Typology Code available for payment source | Attending: Emergency Medicine | Admitting: Emergency Medicine

## 2023-01-24 ENCOUNTER — Other Ambulatory Visit: Payer: Self-pay

## 2023-01-24 ENCOUNTER — Encounter (HOSPITAL_COMMUNITY): Payer: Self-pay | Admitting: Emergency Medicine

## 2023-01-24 DIAGNOSIS — Z760 Encounter for issue of repeat prescription: Secondary | ICD-10-CM | POA: Diagnosis not present

## 2023-01-24 DIAGNOSIS — F209 Schizophrenia, unspecified: Secondary | ICD-10-CM | POA: Insufficient documentation

## 2023-01-24 MED ORDER — LIDOCAINE 5 % EX PTCH
1.0000 | MEDICATED_PATCH | CUTANEOUS | Status: DC
  Administered 2023-01-24: 1 via TRANSDERMAL
  Filled 2023-01-24: qty 1
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
## 2023-01-24 NOTE — Discharge Instructions (Signed)
Use the topical lidocaine patches were given here.  He cut into small pieces and he can be left on for up to 12 hours.  Use the splint.  Follow-up with Dr. Dallas Schimke as scheduled.

## 2023-01-24 NOTE — ED Provider Notes (Signed)
[place] EMERGENCY DEPARTMENT AT [facility] Provider Note   CSN: [id number] Arrival date & time: 01/24/23  1544     History  Chief Complaint  Patient presents with   Medication Refill    Cole [name] is a 42 y.o. male.  Past medical history of schizophrenia, polysubstance abuse.  He presents the ER resting a refill on his oxycodone.  He states he has been taking it for his left index finger fracture and ran out couple of days ago.  He reports he follows up with Ortho in 3 days.  He denies any increase in swelling or pain, no fevers or chills no other complaints.  He also states he needs a new splint because he lost his previous 1   Medication Refill      Home Medications Prior to Admission medications   Medication Sig Start Date End Date Taking? Authorizing Provider  ALPRAZolam Prudy Feeler) 1 MG tablet Take 1 mg by mouth 3 (three) times daily as needed for anxiety. 03/12/21   [provider]  amoxicillin (AMOXIL) 500 MG capsule Take 2 capsules (1,000 mg total) by mouth 2 (two) times daily. 11/19/22   Dione Booze, MD  baclofen (LIORESAL) 10 MG tablet Take 10 mg by mouth 3 (three) times daily as needed for muscle spasms. 11/16/22   [provider]  buprenorphine-naloxone (SUBOXONE) 8-2 mg SUBL SL tablet Place 1 tablet under the tongue 2 (two) times daily. 11/20/22   [provider]  gabapentin (NEURONTIN) 800 MG tablet Take 800 mg by mouth 3 (three) times daily. 04/24/21   [provider]      Allergies    Aleve [naproxen sodium], Aspirin, Ibuprofen, Seroquel [quetiapine fumerate], Tramadol, and Tylenol [acetaminophen]    Review of Systems   Review of Systems  Physical Exam Updated Vital Signs BP 121/68 (BP Location: Right Arm)   Pulse 69   Temp 98 F (36.7 C) (Oral)   Resp 16   Ht 5\' 9"  (1.753 m)   Wt 85.3 kg   SpO2 96%   BMI 27.76 kg/m  Physical Exam Vitals and nursing note reviewed.  Constitutional:      General: He  is not in acute distress.    Appearance: He is well-developed.  HENT:     Head: Normocephalic and atraumatic.     Mouth/Throat:     Mouth: Mucous membranes are moist.  Eyes:     Conjunctiva/sclera: Conjunctivae normal.  Cardiovascular:     Rate and Rhythm: Normal rate and regular rhythm.     Heart sounds: No murmur heard. Pulmonary:     Effort: Pulmonary effort is normal. No respiratory distress.     Breath sounds: Normal breath sounds.  Abdominal:     Palpations: Abdomen is soft.     Tenderness: There is no abdominal tenderness.  Musculoskeletal:        General: No swelling.     Cervical back: Neck supple.     Comments: Left index finger has the PIPJ joint of the middle phalanx.  He is able to flex and extend the joint but reports some discomfort with this.  Is able to extend without difficulty.  Capillary fill is brisk and sensation intact to the finger.  Skin:    General: Skin is warm and dry.     Capillary Refill: Capillary refill takes less than 2 seconds.  Neurological:     General: No focal deficit present.     Mental Status: He is alert.  Psychiatric:        Mood and Affect: Mood normal.     ED Results / Procedures / Treatments   Labs (all labs ordered are listed, but only abnormal results are displayed) Labs Reviewed - No data to display  EKG None  Radiology No results found.  Procedures Procedures    Medications Ordered in ED Medications  lidocaine (LIDODERM) 5 % 1 patch (has no administration in time range)    ED Course/ Medical Decision Making/ A&P                             Medical Decision Making DDx: Medication refill, finger fracture, other ED course: Patient is requesting repeat prescription of oxycodone.  His note was reviewed from Dr. Dallas Schimke, indicated that he is giving patient the last prescription of the oxycodone.  Discussed with patient we would not be able to refill another narcotic prescription.  He is unfortunate allergic to all  over-the-counter medications.  We will provide him with a new splint at his request and I wrote him a Lidoderm patch to put over the area topically to see if this can give him some relief as other choices are limited due to allergies.  I reviewed patient's PDMP as well.  He just filled Suboxone prescription 5 days ago, further supporting decision to not prescribe further narcotics.  Amount and/or Complexity of Data Reviewed External Data Reviewed: notes.  Risk Prescription drug management.           Final Clinical Impression(s) / ED Diagnoses Final diagnoses:  Encounter for medication refill    Rx / DC Orders ED Discharge Orders     None         Josem Kaufmann 01/24/23 1808    Terrilee Files, MD 01/25/23 [phone]

## 2023-01-25 ENCOUNTER — Other Ambulatory Visit (INDEPENDENT_AMBULATORY_CARE_PROVIDER_SITE_OTHER): Payer: Medicaid Other

## 2023-01-25 ENCOUNTER — Encounter: Payer: Medicaid Other | Admitting: Orthopedic Surgery

## 2023-01-25 ENCOUNTER — Ambulatory Visit (INDEPENDENT_AMBULATORY_CARE_PROVIDER_SITE_OTHER): Payer: Medicaid Other | Admitting: Orthopedic Surgery

## 2023-01-25 ENCOUNTER — Encounter: Payer: Self-pay | Admitting: Orthopedic Surgery

## 2023-01-25 ENCOUNTER — Emergency Department (HOSPITAL_COMMUNITY)
Admission: EM | Admit: 2023-01-25 | Discharge: 2023-01-25 | Discharge status: Against Medical Advice | Payer: Medicaid Other | Source: Home / Self Care

## 2023-01-25 DIAGNOSIS — S62621D Displaced fracture of medial phalanx of left index finger, subsequent encounter for fracture with routine healing: Secondary | ICD-10-CM

## 2023-01-25 NOTE — Progress Notes (Signed)
Return patient Visit  Assessment: Cole [name] is a 42 y.o. male with the following: 1. Displaced fracture of middle phalanx of left index finger, subsequent encounter for closed fracture  Plan: Cole [name] sustained a comminuted middle phalanx fracture of the left index finger.  Radiographs have remained stable.  He still has some swelling of the finger.  He has good range of motion of the PIP joint, with limited motion of the DIP joint.  At this point, I have encouraged him to start working on his range of motion.  He may not achieve full motion of the DIP joint, but should be able to have improved function, especially at the PIP joint.  He needs to restart his Suboxone.  No additional medications have been provided.  He will follow-up as needed.   Follow-up: Return if symptoms worsen or fail to improve.  Subjective:  Chief Complaint  Patient presents with   Hand Problem    Left index finger feels better still swollen / states needs new splint     History of Present Illness: Cole [name] is a 42 y.o. male who returns for evaluation of left hand pain.  He sustained an injury to the left index finger approximately 6 weeks ago.  He still has pain and swelling.  He has improved motion in the finger.  He is no longer using his splint.  He states he needs a new splint.   Review of Systems: No fevers or chills No numbness or tingling No chest pain No shortness of breath No bowel or bladder dysfunction No GI distress No headaches     Objective: There were no vitals taken for this visit.  Physical Exam:  General: Alert and oriented. and No acute distress. Gait: Normal gait. Swelling of the left index finger.  He continues to have some tenderness to palpation.  Limited motion at the DIP joint.  He tolerates flexion of the PIP joint to approximately 90 degrees.  Fingers are warm and well-perfused otherwise.   IMAGING: I personally ordered and reviewed the  following images  X-rays of the left index finger were obtained in clinic today.  There is a comminuted fracture of the middle phalanx, without displacement of the intra-articular surfaces proximally and distally.  No interval displacement.  There has been some interval consolidation.  No bony lesions.  Impression: Stable left index finger, comminuted middle phalanx fracture   New Medications:  No orders of the defined types were placed in this encounter.     Oliver Barre, MD  01/25/2023 3:40 PM

## 2023-01-27 ENCOUNTER — Other Ambulatory Visit: Payer: Self-pay | Admitting: Orthopaedic Surgery

## 2023-02-05 ENCOUNTER — Emergency Department (HOSPITAL_COMMUNITY)
Admission: EM | Admit: 2023-02-05 | Discharge: 2023-02-05 | Disposition: A | Discharge status: Home / Self Care | Payer: Medicaid Other | Attending: Emergency Medicine | Admitting: Emergency Medicine

## 2023-02-05 ENCOUNTER — Encounter (HOSPITAL_COMMUNITY): Payer: Self-pay

## 2023-02-05 ENCOUNTER — Other Ambulatory Visit: Payer: Self-pay

## 2023-02-05 DIAGNOSIS — M79645 Pain in left finger(s): Secondary | ICD-10-CM | POA: Diagnosis present
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
## 2023-02-05 NOTE — ED Notes (Signed)
Pt provided a snack and beverage at this time.

## 2023-02-05 NOTE — ED Triage Notes (Signed)
Pt arrived via POV c/o left index finger swelling. Pt reports he has an upcoming appointment with a pain clinic [facility]. Pt reports being out of Oxycodone. Pt requesting a snack at this time.

## 2023-02-13 DIAGNOSIS — Z5900 Homelessness unspecified: Secondary | ICD-10-CM | POA: Diagnosis not present

## 2023-02-13 DIAGNOSIS — R45851 Suicidal ideations: Secondary | ICD-10-CM | POA: Insufficient documentation

## 2023-02-13 DIAGNOSIS — F1594 Other stimulant use, unspecified with stimulant-induced mood disorder: Secondary | ICD-10-CM | POA: Insufficient documentation

## 2023-02-13 DIAGNOSIS — F1494 Cocaine use, unspecified with cocaine-induced mood disorder: Secondary | ICD-10-CM | POA: Diagnosis present

## 2023-02-13 DIAGNOSIS — F17210 Nicotine dependence, cigarettes, uncomplicated: Secondary | ICD-10-CM | POA: Diagnosis not present

## 2023-02-13 DIAGNOSIS — F1994 Other psychoactive substance use, unspecified with psychoactive substance-induced mood disorder: Secondary | ICD-10-CM | POA: Diagnosis not present

## 2023-02-13 DIAGNOSIS — F332 Major depressive disorder, recurrent severe without psychotic features: Secondary | ICD-10-CM | POA: Insufficient documentation

## 2023-02-13 NOTE — ED Triage Notes (Signed)
Pt states he feels depressed and hopeless, is homeless. Pt states he does not want to kill himself but feels down right now and wouldn't care if he did die because he has no one in his life that cares.  C/o pain in knees and feet from walking. Pt is barefoot in triage

## 2023-02-14 ENCOUNTER — Encounter (HOSPITAL_COMMUNITY): Payer: Self-pay

## 2023-02-14 ENCOUNTER — Emergency Department (HOSPITAL_COMMUNITY)
Admission: EM | Admit: 2023-02-14 | Discharge: 2023-02-15 | Disposition: A | Discharge status: Home / Self Care | Payer: Medicaid Other | Attending: Emergency Medicine | Admitting: Emergency Medicine

## 2023-02-14 ENCOUNTER — Other Ambulatory Visit: Payer: Self-pay

## 2023-02-14 DIAGNOSIS — Z5900 Homelessness unspecified: Secondary | ICD-10-CM

## 2023-02-14 DIAGNOSIS — R45851 Suicidal ideations: Secondary | ICD-10-CM

## 2023-02-14 DIAGNOSIS — F1994 Other psychoactive substance use, unspecified with psychoactive substance-induced mood disorder: Secondary | ICD-10-CM | POA: Diagnosis present

## 2023-02-14 LAB — COMPREHENSIVE METABOLIC PANEL
ALT: 24 U/L (ref 0–44)
AST: 24 U/L (ref 15–41)
Albumin: 4.7 g/dL (ref 3.5–5.0)
Alkaline Phosphatase: 50 U/L (ref 38–126)
Anion gap: 10 (ref 5–15)
BUN: 15 mg/dL (ref 6–20)
CO2: 29 mmol/L (ref 22–32)
Calcium: 9 mg/dL (ref 8.9–10.3)
Chloride: 96 mmol/L — ABNORMAL LOW (ref 98–111)
Creatinine, Ser: 0.86 mg/dL (ref 0.61–1.24)
GFR, Estimated: >60 mL/min (ref >60)
Glucose, Bld: 105 mg/dL — ABNORMAL HIGH (ref 70–99)
Potassium: 3.1 mmol/L — ABNORMAL LOW (ref 3.5–5.1)
Sodium: 135 mmol/L (ref 135–145)
Total Bilirubin: 2.4 mg/dL — ABNORMAL HIGH (ref 0.3–1.2)
Total Protein: 8.6 g/dL — ABNORMAL HIGH (ref 6.5–8.1)

## 2023-02-14 LAB — ETHANOL: Alcohol, Ethyl (B): <10 mg/dL (ref <10)

## 2023-02-14 LAB — CBC
HCT: 46.8 % (ref 39.0–52.0)
Hemoglobin: 15 g/dL (ref 13.0–17.0)
MCH: 25.2 pg — ABNORMAL LOW (ref 26.0–34.0)
MCHC: 32.1 g/dL (ref 30.0–36.0)
MCV: 78.7 fL — ABNORMAL LOW (ref 80.0–100.0)
Platelets: 184 10*3/uL (ref 150–400)
RBC: 5.95 MIL/uL — ABNORMAL HIGH (ref 4.22–5.81)
RDW: 13.8 % (ref 11.5–15.5)
WBC: 7.7 10*3/uL (ref 4.0–10.5)
nRBC: 0 % (ref 0.0–0.2)

## 2023-02-14 LAB — RAPID URINE DRUG SCREEN, HOSP PERFORMED
Amphetamines: POSITIVE — AB
Barbiturates: NOT DETECTED
Benzodiazepines: NOT DETECTED
Cocaine: POSITIVE — AB
Opiates: NOT DETECTED
Tetrahydrocannabinol: NOT DETECTED

## 2023-02-14 LAB — SALICYLATE LEVEL: Salicylate Lvl: <7 mg/dL — ABNORMAL LOW (ref 7.0–30.0)

## 2023-02-14 LAB — ACETAMINOPHEN LEVEL: Acetaminophen (Tylenol), Serum: <10 ug/mL — ABNORMAL LOW (ref 10–30)

## 2023-02-14 MED ORDER — OLANZAPINE 5 MG PO TBDP
5.0000 mg | ORAL_TABLET | Freq: Every day | ORAL | Status: DC
  Administered 2023-02-14: 5 mg via ORAL
  Filled 2023-02-14: qty 1

## 2023-02-14 MED ORDER — BUPRENORPHINE HCL-NALOXONE HCL 8-2 MG SL SUBL
1.0000 | SUBLINGUAL_TABLET | Freq: Two times a day (BID) | SUBLINGUAL | Status: DC
  Administered 2023-02-14 (×2): 1 via SUBLINGUAL
  Filled 2023-02-14 (×2): qty 1

## 2023-02-14 NOTE — ED Notes (Signed)
Pt still can't give ua at the this time
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
## 2023-02-14 NOTE — ED Notes (Signed)
Voluntary consent faxed to [facility] 
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
## 2023-02-14 NOTE — ED Notes (Signed)
Pt asked to use the BR. After waiting a while and not hearing anything this tech opened the door and found pt sitting in shower chair eating jolly ranchers. Pt said he didn't need to use the BR.

## 2023-02-14 NOTE — ED Notes (Signed)
Pt given bag lunch °

## 2023-02-14 NOTE — ED Notes (Signed)
Pt unable to give ua at this time.

## 2023-02-14 NOTE — ED Notes (Signed)
Pt has been laying face down on the bed and appears to be hearing voices. Pt is arguing and talking very loudly to someoone that is not here.

## 2023-02-14 NOTE — ED Notes (Signed)
TTS completed. 

## 2023-02-14 NOTE — ED Provider Notes (Signed)
[place] EMERGENCY DEPARTMENT AT [facility]  Provider Note  CSN: [id number] Arrival date & time: 02/13/23 2306  History No chief complaint on file.   Cole [name] is a 42 y.o. male with history of depression, polysubstance abuse and chronic pain currently maintained on suboxone prescribed by [facility] reports increased depression, hopelessness and SI tonight prompting his ED visit. Denies any EtOH or illicit drug use.    Home Medications Prior to Admission medications   Medication Sig Start Date End Date Taking? Authorizing Provider  ALPRAZolam Prudy Feeler) 1 MG tablet Take 1 mg by mouth 3 (three) times daily as needed for anxiety. 03/12/21   [provider]  amoxicillin (AMOXIL) 500 MG capsule Take 2 capsules (1,000 mg total) by mouth 2 (two) times daily. Patient not taking: Reported on 01/25/2023 11/19/22   Dione Booze, MD  baclofen (LIORESAL) 10 MG tablet Take 10 mg by mouth 3 (three) times daily as needed for muscle spasms. 11/16/22   [provider]  buprenorphine-naloxone (SUBOXONE) 8-2 mg SUBL SL tablet Place 1 tablet under the tongue 2 (two) times daily. 11/20/22   [provider]  gabapentin (NEURONTIN) 800 MG tablet Take 800 mg by mouth 3 (three) times daily. 04/24/21   [provider]  meloxicam (MOBIC) 15 MG tablet Take 15 mg by mouth daily. 12/20/22   [provider]     Allergies    Aleve [naproxen sodium], Aspirin, Ibuprofen, Seroquel [quetiapine fumerate], Tramadol, and Tylenol [acetaminophen]   Review of Systems   Review of Systems Please see HPI for pertinent positives and negatives  Physical Exam BP 125/76 (BP Location: Left Arm)   Pulse 91   Temp 97.7 F (36.5 C) (Oral)   Resp 16   SpO2 97%   Physical Exam Vitals and nursing note reviewed.  HENT:     Head: Normocephalic.     Nose: Nose normal.  Eyes:     Extraocular Movements: Extraocular movements intact.  Pulmonary:     Effort: Pulmonary  effort is normal.  Musculoskeletal:        General: Normal range of motion.     Cervical back: Neck supple.  Skin:    Findings: No rash (on exposed skin).  Neurological:     Mental Status: He is alert and oriented to person, place, and time.  Psychiatric:        Mood and Affect: Mood normal.     ED Results / Procedures / Treatments   EKG EKG Interpretation  Date/Time:  Monday February 14 2023 01:01:33 EDT Ventricular Rate:  83 PR Interval:  172 QRS Duration: 102 QT Interval:  390 QTC Calculation: 458 R Axis:   108 Text Interpretation: Normal sinus rhythm Rightward axis Borderline ECG When compared with ECG of 06-Jun-2017 22:56, No significant change since last tracing Confirmed by Susy Frizzle [phone]) on 02/14/2023 1:15:30 AM  Procedures Procedures  Medications Ordered in the ED Medications  buprenorphine-naloxone (SUBOXONE) 8-2 mg per SL tablet 1 tablet (has no administration in time range)    Initial Impression and Plan  Patient here for SI, depression. Requesting a dose of suboxone and something to help him sleep. He has labs done in triage showing unremarkable CBC, BMP and EtOH. ASA and APAP are neg. UDS is pending. Plan TTS consult.   ED Course   Clinical Course as of 02/14/23 0547  Mon Feb 14, 2023  6213 Patient is medically clear for TTS consult. Suboxone ordered per his usual home dose.  Still has not provided a urine specimen.  [CS]    Clinical Course User Index [CS] Pollyann Savoy, MD     MDM Rules/Calculators/A&P Medical Decision Making Problems Addressed: Suicidal ideation: acute illness or injury  Amount and/or Complexity of Data Reviewed Labs: ordered. Decision-making details documented in ED Course.  Risk Prescription drug management. Decision regarding hospitalization.     Final Clinical Impression(s) / ED Diagnoses Final diagnoses:  Suicidal ideation    Rx / DC Orders ED Discharge Orders     None        Pollyann Savoy,  MD 02/14/23 [phone]

## 2023-02-14 NOTE — ED Provider Notes (Signed)
Emergency Medicine Observation Re-evaluation Note  Cole [name] is a 42 y.o. male, seen on rounds today.  Pt initially presented to the ED for complaints of V70.1 Currently, the patient is sleeping.  Physical Exam  BP 125/76 (BP Location: Left Arm)   Pulse 91   Temp 97.7 F (36.5 C) (Oral)   Resp 16   Ht 5\' 9"  (1.753 m)   Wt 85.3 kg   SpO2 97%   BMI 27.76 kg/m  Physical Exam General: Sleeping Cardiac: Extremities well-perfused Lungs: Breathing is unlabored Psych: Deferred  ED Course / MDM  EKG:EKG Interpretation  Date/Time:  Monday February 14 2023 01:01:33 EDT Ventricular Rate:  83 PR Interval:  172 QRS Duration: 102 QT Interval:  390 QTC Calculation: 458 R Axis:   108 Text Interpretation: Normal sinus rhythm Rightward axis Borderline ECG When compared with ECG of 06-Jun-2017 22:56, No significant change since last tracing Confirmed by Susy Frizzle [phone]) on 02/14/2023 1:15:30 AM  I have reviewed the labs performed to date as well as medications administered while in observation.  Recent changes in the last 24 hours include presentation to the emergency department last night for suicidal ideation.  He has been medically cleared.  TTS evaluated and recommends continued observation and TTS reevaluation.  Plan  Current plan is for TTS reevaluation.    Gloris Manchester, MD 02/14/23 [phone]

## 2023-02-15 NOTE — ED Notes (Signed)
Pt visualized eloping from department, ran across street before any staff, stated, "I'm leaving since y'all ain't letting me have any water!". Seen across the street by ED staff, Dr. Bernette Mayers made aware, ok to let him leave per doctor.

## 2023-02-18 ENCOUNTER — Emergency Department (EMERGENCY_DEPARTMENT_HOSPITAL)
Admission: EM | Admit: 2023-02-18 | Discharge: 2023-02-19 | Disposition: A | Discharge status: Other Acute Inpatient Hospital | Payer: Medicaid Other | Source: Home / Self Care | Attending: Emergency Medicine | Admitting: Emergency Medicine

## 2023-02-18 ENCOUNTER — Encounter (HOSPITAL_COMMUNITY): Payer: Self-pay | Admitting: Emergency Medicine

## 2023-02-18 ENCOUNTER — Other Ambulatory Visit: Payer: Self-pay

## 2023-02-18 DIAGNOSIS — F331 Major depressive disorder, recurrent, moderate: Secondary | ICD-10-CM | POA: Insufficient documentation

## 2023-02-18 DIAGNOSIS — F1510 Other stimulant abuse, uncomplicated: Secondary | ICD-10-CM | POA: Insufficient documentation

## 2023-02-18 DIAGNOSIS — F1490 Cocaine use, unspecified, uncomplicated: Secondary | ICD-10-CM | POA: Insufficient documentation

## 2023-02-18 DIAGNOSIS — F17210 Nicotine dependence, cigarettes, uncomplicated: Secondary | ICD-10-CM | POA: Insufficient documentation

## 2023-02-18 DIAGNOSIS — R45851 Suicidal ideations: Secondary | ICD-10-CM

## 2023-02-18 DIAGNOSIS — J45909 Unspecified asthma, uncomplicated: Secondary | ICD-10-CM | POA: Insufficient documentation

## 2023-02-18 LAB — ACETAMINOPHEN LEVEL: Acetaminophen (Tylenol), Serum: <10 ug/mL — ABNORMAL LOW (ref 10–30)

## 2023-02-18 LAB — RAPID URINE DRUG SCREEN, HOSP PERFORMED
Amphetamines: NOT DETECTED
Barbiturates: NOT DETECTED
Benzodiazepines: NOT DETECTED
Cocaine: POSITIVE — AB
Opiates: NOT DETECTED
Tetrahydrocannabinol: NOT DETECTED

## 2023-02-18 LAB — ETHANOL: Alcohol, Ethyl (B): <10 mg/dL (ref <10)

## 2023-02-18 LAB — SALICYLATE LEVEL: Salicylate Lvl: <7 mg/dL — ABNORMAL LOW (ref 7.0–30.0)

## 2023-02-18 MED ORDER — BUPRENORPHINE HCL-NALOXONE HCL 8-2 MG SL SUBL
1.0000 | SUBLINGUAL_TABLET | Freq: Two times a day (BID) | SUBLINGUAL | Status: DC
  Administered 2023-02-18 – 2023-02-19 (×2): 1 via SUBLINGUAL
  Filled 2023-02-18 (×2): qty 1

## 2023-02-18 NOTE — ED Notes (Signed)
Pt dressed into burgundy scrubs and personal belongings put into locker. Pt wanded by security and pt found with cigarettes and lighter in undergarments. Cigarettes and lighter taken from pt and put into locker with personal belongings.

## 2023-02-18 NOTE — BH Assessment (Signed)
TTS attempted to assess pt. Pt was not located in a room and was sitting in the hallway. TTS informed Riley Lam, RN via secure messaging  to notify TTS when pt has been placed in a room and ready for assessment.

## 2023-02-18 NOTE — BH Assessment (Signed)
IRIS has been initiated for this patient. The IRIS coordinator Ileene Hutchinson), reachable at [phone], has scheduled a telepsych appointment for the patient with July Tenbrook, NP at 2100.

## 2023-02-18 NOTE — ED Triage Notes (Signed)
Pt c/o suicidal ideation and severe depression x 2 days ago. He notes he was here a few days ago but left without receiving treatment. He is trying to detox from cocaine, last use was yesterday (smoked). Pt admits to suicidal thoughts but denies having a plan and no recent attempts.

## 2023-02-18 NOTE — ED Provider Notes (Signed)
[place] EMERGENCY DEPARTMENT AT [facility] Provider Note   CSN: [id number] Arrival date & time: 02/18/23  1400     History  Chief Complaint  Patient presents with   Suicidal    Cole [name] is a 42 y.o. male.   Patient with history of depression, polysubstance abuse and chronic pain currently maintained on suboxone presents today with complaints of suicidal ideation. He was just here for same and ran out on 6/17. He was not IVCed at that time and was allowed to leave. He had seen TTS who recommended inpatient treatment. He states that he is still feeling suicidal and returns for same. States that he has a plan to overdose. Denies any changes in his condition since he was here yesterday. He does note cocaine use, last use yesterday. He denies SI/HI or AVH. Denies any somatic complaints.   The history is provided by the patient. No language interpreter was used.       Home Medications Prior to Admission medications   Medication Sig Start Date End Date Taking? Authorizing Provider  ALPRAZolam Prudy Feeler) 1 MG tablet Take 1 mg by mouth 3 (three) times daily as needed for anxiety. 03/12/21   [provider]  amoxicillin (AMOXIL) 500 MG capsule Take 2 capsules (1,000 mg total) by mouth 2 (two) times daily. Patient not taking: Reported on 01/25/2023 11/19/22   Dione Booze, MD  baclofen (LIORESAL) 10 MG tablet Take 10 mg by mouth 3 (three) times daily as needed for muscle spasms. 11/16/22   [provider]  buprenorphine-naloxone (SUBOXONE) 8-2 mg SUBL SL tablet Place 1 tablet under the tongue 2 (two) times daily. 11/20/22   [provider]  gabapentin (NEURONTIN) 800 MG tablet Take 800 mg by mouth 3 (three) times daily. 04/24/21   [provider]  meloxicam (MOBIC) 15 MG tablet Take 15 mg by mouth daily. 12/20/22   [provider]      Allergies    Aleve [naproxen sodium], Aspirin, Ibuprofen, Seroquel [quetiapine fumerate],  Tramadol, and Tylenol [acetaminophen]    Review of Systems   Review of Systems  Psychiatric/Behavioral:  Positive for suicidal ideas.   All other systems reviewed and are negative.   Physical Exam Updated Vital Signs BP 119/68   Pulse 60   Temp 98.3 F (36.8 C) (Oral)   Resp 15   Ht 5\' 9"  (1.753 m)   Wt 84.8 kg   SpO2 99%   BMI 27.62 kg/m  Physical Exam Vitals and nursing note reviewed.  Constitutional:      General: He is not in acute distress.    Appearance: Normal appearance. He is normal weight. He is not ill-appearing, toxic-appearing or diaphoretic.  HENT:     Head: Normocephalic and atraumatic.  Cardiovascular:     Rate and Rhythm: Normal rate.  Pulmonary:     Effort: Pulmonary effort is normal. No respiratory distress.  Musculoskeletal:        General: Normal range of motion.     Cervical back: Normal range of motion.  Skin:    General: Skin is warm and dry.  Neurological:     General: No focal deficit present.     Mental Status: He is alert.  Psychiatric:        Mood and Affect: Mood normal.        Behavior: Behavior normal.     Comments: Does not appear to be responding to internal stimuli     ED Results / Procedures /  Treatments   Labs (all labs ordered are listed, but only abnormal results are displayed) Labs Reviewed  SALICYLATE LEVEL - Abnormal; Notable for the following components:      Result Value   Salicylate Lvl <7.0 (*)    All other components within normal limits  ACETAMINOPHEN LEVEL - Abnormal; Notable for the following components:   Acetaminophen (Tylenol), Serum <10 (*)    All other components within normal limits  RAPID URINE DRUG SCREEN, HOSP PERFORMED - Abnormal; Notable for the following components:   Cocaine POSITIVE (*)    All other components within normal limits  ETHANOL    EKG None  Radiology No results found.  Procedures Procedures    Medications Ordered in ED Medications - No data to display  ED Course/  Medical Decision Making/ A&P                             Medical Decision Making Amount and/or Complexity of Data Reviewed Labs: ordered.   Patient is a 42 y.o. male  who presents to the emergency department for psychiatric complaint.  Past Medical History: history of depression, polysubstance abuse and chronic pain  Physical Exam: Does not appear to be responding to internal stimuli  Labs: Medical clearance labs ordered, with following pertinent results: UDS + cocaine  Medications: I ordered medication including suboxone  for home meds. I have reviewed the patients home medicines and have made adjustments as needed.  Disposition: Patient is otherwise medically cleared at this time pending medical clearance laboratory evaluation. Will consult TTS and appreciate their recommendations.   Final Clinical Impression(s) / ED Diagnoses Final diagnoses:  Suicidal ideation    Rx / DC Orders ED Discharge Orders     None         Vear Clock 02/18/23 Minerva Areola, MD 02/20/23 1316

## 2023-02-18 NOTE — ED Notes (Signed)
Due to Technical Issues, TTS Consult unable to be performed at this time.

## 2023-02-18 NOTE — ED Notes (Signed)
Pt moved to private room for TTS Consult at this time.  

## 2023-02-18 NOTE — ED Notes (Signed)
TTS in progress. Kellogg RN

## 2023-02-19 ENCOUNTER — Inpatient Hospital Stay (HOSPITAL_COMMUNITY)
Admission: RE | Admit: 2023-02-19 | Discharge: 2023-02-25 | DRG: 885 | Payer: Medicaid Other | Source: Intra-hospital | Attending: Psychiatry | Admitting: Psychiatry

## 2023-02-19 ENCOUNTER — Encounter (HOSPITAL_COMMUNITY): Payer: Self-pay | Admitting: Psychiatry

## 2023-02-19 DIAGNOSIS — Z79899 Other long term (current) drug therapy: Secondary | ICD-10-CM | POA: Diagnosis not present

## 2023-02-19 DIAGNOSIS — G629 Polyneuropathy, unspecified: Secondary | ICD-10-CM | POA: Diagnosis present

## 2023-02-19 DIAGNOSIS — Z886 Allergy status to analgesic agent status: Secondary | ICD-10-CM

## 2023-02-19 DIAGNOSIS — F17210 Nicotine dependence, cigarettes, uncomplicated: Secondary | ICD-10-CM | POA: Diagnosis present

## 2023-02-19 DIAGNOSIS — Z83438 Family history of other disorder of lipoprotein metabolism and other lipidemia: Secondary | ICD-10-CM

## 2023-02-19 DIAGNOSIS — G8929 Other chronic pain: Secondary | ICD-10-CM | POA: Diagnosis present

## 2023-02-19 DIAGNOSIS — F1490 Cocaine use, unspecified, uncomplicated: Secondary | ICD-10-CM | POA: Diagnosis present

## 2023-02-19 DIAGNOSIS — J45909 Unspecified asthma, uncomplicated: Secondary | ICD-10-CM | POA: Diagnosis present

## 2023-02-19 DIAGNOSIS — Z23 Encounter for immunization: Secondary | ICD-10-CM | POA: Diagnosis not present

## 2023-02-19 DIAGNOSIS — F1120 Opioid dependence, uncomplicated: Secondary | ICD-10-CM | POA: Diagnosis present

## 2023-02-19 DIAGNOSIS — F250 Schizoaffective disorder, bipolar type: Secondary | ICD-10-CM | POA: Diagnosis present

## 2023-02-19 DIAGNOSIS — F1510 Other stimulant abuse, uncomplicated: Secondary | ICD-10-CM | POA: Diagnosis present

## 2023-02-19 DIAGNOSIS — F1010 Alcohol abuse, uncomplicated: Secondary | ICD-10-CM | POA: Diagnosis present

## 2023-02-19 DIAGNOSIS — R45851 Suicidal ideations: Secondary | ICD-10-CM | POA: Diagnosis present

## 2023-02-19 DIAGNOSIS — G4700 Insomnia, unspecified: Secondary | ICD-10-CM | POA: Diagnosis present

## 2023-02-19 DIAGNOSIS — G2401 Drug induced subacute dyskinesia: Secondary | ICD-10-CM | POA: Diagnosis present

## 2023-02-19 DIAGNOSIS — F331 Major depressive disorder, recurrent, moderate: Secondary | ICD-10-CM | POA: Diagnosis not present

## 2023-02-19 DIAGNOSIS — F4310 Post-traumatic stress disorder, unspecified: Secondary | ICD-10-CM | POA: Diagnosis present

## 2023-02-19 DIAGNOSIS — R569 Unspecified convulsions: Secondary | ICD-10-CM | POA: Diagnosis present

## 2023-02-19 DIAGNOSIS — F332 Major depressive disorder, recurrent severe without psychotic features: Secondary | ICD-10-CM | POA: Diagnosis present

## 2023-02-19 DIAGNOSIS — Z560 Unemployment, unspecified: Secondary | ICD-10-CM | POA: Diagnosis not present

## 2023-02-19 DIAGNOSIS — B1920 Unspecified viral hepatitis C without hepatic coma: Secondary | ICD-10-CM | POA: Diagnosis present

## 2023-02-19 DIAGNOSIS — Z791 Long term (current) use of non-steroidal anti-inflammatories (NSAID): Secondary | ICD-10-CM

## 2023-02-19 DIAGNOSIS — F259 Schizoaffective disorder, unspecified: Secondary | ICD-10-CM | POA: Diagnosis present

## 2023-02-19 DIAGNOSIS — F251 Schizoaffective disorder, depressive type: Secondary | ICD-10-CM | POA: Diagnosis not present

## 2023-02-19 DIAGNOSIS — F1310 Sedative, hypnotic or anxiolytic abuse, uncomplicated: Secondary | ICD-10-CM | POA: Diagnosis present

## 2023-02-19 DIAGNOSIS — F17290 Nicotine dependence, other tobacco product, uncomplicated: Secondary | ICD-10-CM | POA: Diagnosis present

## 2023-02-19 DIAGNOSIS — Z765 Malingerer [conscious simulation]: Secondary | ICD-10-CM

## 2023-02-19 DIAGNOSIS — Z833 Family history of diabetes mellitus: Secondary | ICD-10-CM

## 2023-02-19 DIAGNOSIS — Z5902 Unsheltered homelessness: Secondary | ICD-10-CM | POA: Diagnosis not present

## 2023-02-19 DIAGNOSIS — M549 Dorsalgia, unspecified: Secondary | ICD-10-CM | POA: Diagnosis present

## 2023-02-19 DIAGNOSIS — M62838 Other muscle spasm: Secondary | ICD-10-CM | POA: Diagnosis present

## 2023-02-19 DIAGNOSIS — Z555 Less than a high school diploma: Secondary | ICD-10-CM

## 2023-02-19 DIAGNOSIS — Z9151 Personal history of suicidal behavior: Secondary | ICD-10-CM

## 2023-02-19 DIAGNOSIS — Z5941 Food insecurity: Secondary | ICD-10-CM

## 2023-02-19 DIAGNOSIS — F411 Generalized anxiety disorder: Secondary | ICD-10-CM | POA: Diagnosis present

## 2023-02-19 DIAGNOSIS — Z91199 Patient's noncompliance with other medical treatment and regimen due to unspecified reason: Secondary | ICD-10-CM

## 2023-02-19 DIAGNOSIS — Z885 Allergy status to narcotic agent status: Secondary | ICD-10-CM

## 2023-02-19 LAB — CBC WITH DIFFERENTIAL/PLATELET
Abs Immature Granulocytes: 0 10*3/uL (ref 0.00–0.07)
Basophils Absolute: 0 10*3/uL (ref 0.0–0.1)
Basophils Relative: 1 %
Eosinophils Absolute: 0.2 10*3/uL (ref 0.0–0.5)
Eosinophils Relative: 4 %
HCT: 37.6 % — ABNORMAL LOW (ref 39.0–52.0)
Hemoglobin: 12 g/dL — ABNORMAL LOW (ref 13.0–17.0)
Immature Granulocytes: 0 %
Lymphocytes Relative: 58 %
Lymphs Abs: 2.1 10*3/uL (ref 0.7–4.0)
MCH: 25.4 pg — ABNORMAL LOW (ref 26.0–34.0)
MCHC: 31.9 g/dL (ref 30.0–36.0)
MCV: 79.7 fL — ABNORMAL LOW (ref 80.0–100.0)
Monocytes Absolute: 0.3 10*3/uL (ref 0.1–1.0)
Monocytes Relative: 9 %
Neutro Abs: 1 10*3/uL — ABNORMAL LOW (ref 1.7–7.7)
Neutrophils Relative %: 28 %
Platelets: 139 10*3/uL — ABNORMAL LOW (ref 150–400)
RBC: 4.72 MIL/uL (ref 4.22–5.81)
RDW: 13.8 % (ref 11.5–15.5)
WBC: 3.7 10*3/uL — ABNORMAL LOW (ref 4.0–10.5)
nRBC: 0 % (ref 0.0–0.2)

## 2023-02-19 LAB — COMPREHENSIVE METABOLIC PANEL
ALT: 20 U/L (ref 0–44)
AST: 16 U/L (ref 15–41)
Albumin: 3.2 g/dL — ABNORMAL LOW (ref 3.5–5.0)
Alkaline Phosphatase: 39 U/L (ref 38–126)
Anion gap: 3 — ABNORMAL LOW (ref 5–15)
BUN: 13 mg/dL (ref 6–20)
CO2: 29 mmol/L (ref 22–32)
Calcium: 8.3 mg/dL — ABNORMAL LOW (ref 8.9–10.3)
Chloride: 104 mmol/L (ref 98–111)
Creatinine, Ser: 0.71 mg/dL (ref 0.61–1.24)
GFR, Estimated: >60 mL/min (ref >60)
Glucose, Bld: 98 mg/dL (ref 70–99)
Potassium: 3.8 mmol/L (ref 3.5–5.1)
Sodium: 136 mmol/L (ref 135–145)
Total Bilirubin: 0.6 mg/dL (ref 0.3–1.2)
Total Protein: 6.1 g/dL — ABNORMAL LOW (ref 6.5–8.1)

## 2023-02-19 MED ORDER — NICOTINE POLACRILEX 2 MG MT GUM
2.0000 mg | CHEWING_GUM | OROMUCOSAL | Status: DC | PRN
  Administered 2023-02-19 – 2023-02-25 (×16): 2 mg via ORAL
  Filled 2023-02-19 (×3): qty 1

## 2023-02-19 MED ORDER — DIPHENHYDRAMINE HCL 50 MG/ML IJ SOLN: 50.0000 mg | Freq: Three times a day (TID) | INTRAMUSCULAR | Status: DC | PRN

## 2023-02-19 MED ORDER — LORAZEPAM 1 MG PO TABS: 2.0000 mg | ORAL_TABLET | Freq: Three times a day (TID) | ORAL | Status: DC | PRN

## 2023-02-19 MED ORDER — LORAZEPAM 2 MG/ML IJ SOLN: 2.0000 mg | Freq: Three times a day (TID) | INTRAMUSCULAR | Status: DC | PRN

## 2023-02-19 MED ORDER — HALOPERIDOL LACTATE 5 MG/ML IJ SOLN: 5.0000 mg | Freq: Three times a day (TID) | INTRAMUSCULAR | Status: DC | PRN

## 2023-02-19 MED ORDER — BUPRENORPHINE HCL-NALOXONE HCL 8-2 MG SL SUBL
1.0000 | SUBLINGUAL_TABLET | Freq: Two times a day (BID) | SUBLINGUAL | Status: DC
  Administered 2023-02-19 – 2023-02-25 (×12): 1 via SUBLINGUAL
  Filled 2023-02-19 (×12): qty 1

## 2023-02-19 MED ORDER — HALOPERIDOL 5 MG PO TABS: 5.0000 mg | ORAL_TABLET | Freq: Three times a day (TID) | ORAL | Status: DC | PRN

## 2023-02-19 MED ORDER — PNEUMOCOCCAL 20-VAL CONJ VACC 0.5 ML IM SUSY
0.5000 mL | PREFILLED_SYRINGE | INTRAMUSCULAR | Status: AC
  Administered 2023-02-20: 0.5 mL via INTRAMUSCULAR
  Filled 2023-02-19: qty 0.5

## 2023-02-19 MED ORDER — ALUM & MAG HYDROXIDE-SIMETH 200-200-20 MG/5ML PO SUSP: 30.0000 mL | ORAL | Status: DC | PRN

## 2023-02-19 MED ORDER — OLANZAPINE 5 MG PO TBDP: 10.0000 mg | ORAL_TABLET | Freq: Three times a day (TID) | ORAL | Status: DC | PRN

## 2023-02-19 MED ORDER — HYDROXYZINE HCL 25 MG PO TABS: 50.0000 mg | ORAL_TABLET | Freq: Three times a day (TID) | ORAL | Status: DC | PRN

## 2023-02-19 MED ORDER — DIPHENHYDRAMINE HCL 25 MG PO CAPS: 50.0000 mg | ORAL_CAPSULE | Freq: Three times a day (TID) | ORAL | Status: DC | PRN

## 2023-02-19 MED ORDER — TRAZODONE HCL 50 MG PO TABS
50.0000 mg | ORAL_TABLET | Freq: Every evening | ORAL | Status: DC | PRN
  Administered 2023-02-19 – 2023-02-24 (×6): 50 mg via ORAL
  Filled 2023-02-19 (×6): qty 1

## 2023-02-19 MED ORDER — MAGNESIUM HYDROXIDE 400 MG/5ML PO SUSP
30.0000 mL | Freq: Every day | ORAL | Status: DC | PRN
  Administered 2023-02-19: 30 mL via ORAL
  Filled 2023-02-19: qty 30

## 2023-02-19 NOTE — Progress Notes (Signed)
   02/19/23 2100  Psych Admission Type (Psych Patients Only)  Admission Status Voluntary  Psychosocial Assessment  Patient Complaints Helplessness;Hopelessness  Eye Contact Fair  Facial Expression Flat  Affect Appropriate to circumstance  Speech Logical/coherent  Interaction Assertive  Motor Activity Slow  Appearance/Hygiene Disheveled  Behavior Characteristics Appropriate to situation  Mood Worthless, low self-esteem  Thought Process  Coherency WDL  Content WDL  Delusions None reported or observed  Perception WDL  Hallucination None reported or observed  Judgment Poor  Confusion None  Danger to Self  Current suicidal ideation? Passive  Self-Injurious Behavior No self-injurious ideation or behavior indicators observed or expressed   Agreement Not to Harm Self Yes  Description of Agreement verbal  Danger to Others  Danger to Others None reported or observed

## 2023-02-19 NOTE — ED Provider Notes (Signed)
Patient accepted to behavioral [facility].  He is calm, in no distress.   Gerhard Munch, MD 02/19/23 [phone]

## 2023-02-19 NOTE — Plan of Care (Signed)
  Problem: Education: Goal: Knowledge of [facility] Education information/materials will improve Outcome: Progressing Goal: Emotional status will improve Outcome: Progressing Goal: Mental status will improve Outcome: Progressing Goal: Verbalization of understanding the information provided will improve Outcome: Progressing   Problem: Activity: Goal: Interest or engagement in activities will improve Outcome: Progressing Goal: Sleeping patterns will improve Outcome: Progressing   Problem: Coping: Goal: Ability to verbalize frustrations and anger appropriately will improve Outcome: Progressing Goal: Ability to demonstrate self-control will improve Outcome: Progressing   Problem: Health Behavior/Discharge Planning: Goal: Identification of resources available to assist in meeting health care needs will improve Outcome: Progressing Goal: Compliance with treatment plan for underlying cause of condition will improve Outcome: Progressing   Problem: Physical Regulation: Goal: Ability to maintain clinical measurements within normal limits will improve Outcome: Progressing   Problem: Safety: Goal: Periods of time without injury will increase Outcome: Progressing   

## 2023-02-19 NOTE — Progress Notes (Signed)
Patient is 42yo male from [facility]  in the context of suicidal ideation. PMH of seizures, anxiety, and depression. NKA allergies. At home pt is taking suboxone, gabapentin, alprazolam, and baclofen. Past surgical history of abdominal surgery (pt could not elaborate). Pt denies being sexually active and vaping. Pt endorses smoking cigarettes and using cocaine. Last use was yesterday 6/21. Pt drinks a 12 pack of alcohol a week. Pt PCP name is Cletis Media. Pt is attracted to male. Pt denies HI/AVH. Pt endorses passive SI with no plan. Pt denies physical, verbal, sexual abuse. Pt is homeless. Last grade finished at school was 11th. Pt wants to work on "getting a job, housing, being able to pay child support, and be in a good environment". Pt was educated on unit policies and educated on unit rules. Pt remains safe on Q15 min checks and contracts for safety.    Pt denies any recent triggers and would not elaborate on reasoning for SI. Pt states his main support system is his family.     02/19/23 1309  Psych Admission Type (Psych Patients Only)  Admission Status Voluntary  Psychosocial Assessment  Patient Complaints Hopelessness;Helplessness;Sadness;Self-harm thoughts;Substance abuse  Eye Contact Brief  Facial Expression Flat  Affect Flat;Depressed  Speech Logical/coherent  Interaction Assertive;Superficial;Guarded  Motor Activity Slow  Appearance/Hygiene Poor hygiene;In scrubs  Behavior Characteristics Cooperative;Guarded  Mood Depressed;Sad  Thought Process  Coherency WDL  Content Blaming self  Delusions None reported or observed  Perception WDL  Hallucination None reported or observed  Judgment Impaired  Confusion None  Danger to Self  Current suicidal ideation? Passive  Agreement Not to Harm Self Yes  Description of Agreement verbal  Danger to Others  Danger to Others None reported or observed

## 2023-02-19 NOTE — ED Notes (Signed)
Pt received breakfast tray 

## 2023-02-19 NOTE — Progress Notes (Signed)
Adult Psychoeducational Group Note  Date:  02/19/2023 Time:  11:54 PM  Group Topic/Focus:  Wrap-Up Group:   The focus of this group is to help patients review their daily goal of treatment and discuss progress on daily workbooks.  Participation Level:  Active  Participation Quality:  Appropriate  Affect:  Appropriate  Cognitive:  Appropriate  Insight: Appropriate  Engagement in Group:  Engaged  Modes of Intervention:  Discussion  Additional Comments:  Pt. Stated today was 8 out of 10. Pt. Stated today was positive because they spoke to family. Pt stated tomorrow is to not sleep too much.  Cole [name] 02/19/2023, 11:54 PM

## 2023-02-19 NOTE — Progress Notes (Signed)
Pt was accepted to [facility] TODAY6/22/2024; Bed Assignment 405-2 PENDING signed vol consent faxed to [facility] [phone]   Pt meets inpatient criteria per Adria Dill, MD   Attending Physician will be Dr. Enedina Finner   Report can be called to: - Adult unit: [phone]  Pt can arrive after:BED IS READY after 9am per morning meeting.  Care Team notified: Day [facility] Sharyne Peach, RN, Mosetta Anis, RN   [place], [place] 02/19/2023 @ 9:16 AM
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
## 2023-02-19 NOTE — Tx Team (Signed)
Initial Treatment Plan 02/19/2023 1:17 PM [name]    PATIENT STRESSORS: Financial difficulties   Marital or family conflict   Occupational concerns     PATIENT STRENGTHS: Average or above average intelligence  Capable of independent living  General fund of knowledge    PATIENT IDENTIFIED PROBLEMS: Suicidal Ideation  Lack of financial resources                   DISCHARGE CRITERIA:  Ability to meet basic life and health needs Adequate post-discharge living arrangements Improved stabilization in mood, thinking, and/or behavior  PRELIMINARY DISCHARGE PLAN: Attend 12-step recovery group Outpatient therapy Placement in alternative living arrangements Return to previous living arrangement Return to previous work or school arrangements  PATIENT/FAMILY INVOLVEMENT: This treatment plan has been presented to and reviewed with the patient, Cole [name], and/or family member, .  The patient and family have been given the opportunity to ask questions and make suggestions.  Virgel Paling, RN 02/19/2023, 1:17 PM

## 2023-02-19 NOTE — ED Notes (Signed)
Safe transport has been called. 

## 2023-02-20 DIAGNOSIS — F251 Schizoaffective disorder, depressive type: Secondary | ICD-10-CM | POA: Diagnosis not present

## 2023-02-20 DIAGNOSIS — F259 Schizoaffective disorder, unspecified: Principal | ICD-10-CM | POA: Diagnosis present

## 2023-02-20 DIAGNOSIS — F250 Schizoaffective disorder, bipolar type: Secondary | ICD-10-CM

## 2023-02-20 HISTORY — DX: Schizoaffective disorder, bipolar type: F25.0

## 2023-02-20 MED ORDER — RISPERIDONE 1 MG PO TBDP
1.0000 mg | ORAL_TABLET | Freq: Two times a day (BID) | ORAL | Status: DC
  Administered 2023-02-20 – 2023-02-25 (×11): 1 mg via ORAL
  Filled 2023-02-20 (×15): qty 1

## 2023-02-20 MED ORDER — VALBENAZINE TOSYLATE 40 MG PO CAPS
40.0000 mg | ORAL_CAPSULE | Freq: Every day | ORAL | Status: DC
  Administered 2023-02-20 – 2023-02-25 (×6): 40 mg via ORAL
  Filled 2023-02-20 (×8): qty 1

## 2023-02-20 MED ORDER — GABAPENTIN 400 MG PO CAPS
800.0000 mg | ORAL_CAPSULE | Freq: Three times a day (TID) | ORAL | Status: DC
  Administered 2023-02-20 – 2023-02-25 (×16): 800 mg via ORAL
  Filled 2023-02-20 (×22): qty 2
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
## 2023-02-20 NOTE — [facility] Suicide Risk Assessment (Signed)
Suicide Risk Assessment  Admission Assessment    [facility] Admission Suicide Risk Assessment   Nursing information obtained from:  Patient Demographic factors:  Male, Unemployed, Low socioeconomic status Current Mental Status:  Suicidal ideation indicated by patient Loss Factors:  Loss of significant relationship, Financial problems / change in socioeconomic status Historical Factors:  NA Risk Reduction Factors:  Responsible for children under 22 years of age, Sense of responsibility to family  Total Time spent with patient: 1.5 hours Principal Problem: Schizoaffective disorder (HCC) Diagnosis:  Principal Problem:   Schizoaffective disorder (HCC) Active Problems:   Insomnia   Anxiety state  Subjective Data: "I was having a mental crisis. I have no place to stay and have been feeling hopeless, depressed, stressed, and overwhelmed."   Continued Clinical Symptoms: Depression and psychosis and in need of continuous hospitalization for treatment and stabilization of mood.   Alcohol Use Disorder Identification Test Final Score (AUDIT): 16 The "Alcohol Use Disorders Identification Test", Guidelines for Use in Primary Care, Second Edition.  World Science writer [facility]). Score between 0-7:  no or low risk or alcohol related problems. Score between 8-15:  moderate risk of alcohol related problems. Score between 16-19:  high risk of alcohol related problems. Score 20 or above:  warrants further diagnostic evaluation for alcohol dependence and treatment.  CLINICAL FACTORS:   Depression:   Anhedonia Hopelessness Insomnia Severe Schizophrenia:   Depressive state More than one psychiatric diagnosis  Musculoskeletal: Strength & Muscle Tone: within normal limits Gait & Station: normal Patient leans: N/A  Psychiatric Specialty Exam:  Presentation  General Appearance:  Disheveled  Eye Contact: Fair  Speech: Clear and Coherent  Speech  Volume: Decreased  Handedness: Right   Mood and Affect  Mood: Depressed  Affect: Congruent   Thought Process  Thought Processes: Coherent  Descriptions of Associations:Intact  Orientation:Partial  Thought Content:Logical  History of Schizophrenia/Schizoaffective disorder:Yes  Duration of Psychotic Symptoms:No data recorded Hallucinations:Hallucinations: Auditory Description of Auditory Hallucinations: denies AH, but observed to be responding to internal stimuli  Ideas of Reference:None  Suicidal Thoughts:Suicidal Thoughts: Yes, Active SI Active Intent and/or Plan: Without Intent; Without Plan  Homicidal Thoughts:Homicidal Thoughts: No   Sensorium  Memory: Immediate Good  Judgment: Poor  Insight: Poor   Executive Functions  Concentration: Fair  Attention Span: Fair  Recall: Poor  Fund of Knowledge: Poor  Language: Fair   Psychomotor Activity  Psychomotor Activity: Psychomotor Activity: Extrapyramidal Side Effects (EPS) Extrapyramidal Side Effects (EPS): Tardive Dyskinesia AIMS Completed?: Yes (AIMS Score at admission: 21)   Assets  Assets: Desire for Improvement; Resilience   Sleep  Sleep: Sleep: Poor    Physical Exam: Physical Exam Constitutional:      Appearance: Normal appearance.  Musculoskeletal:        General: Normal range of motion.     Cervical back: Normal range of motion.  Neurological:     Mental Status: He is alert.    Review of Systems  Constitutional:  Negative for fever.  HENT:  Negative for hearing loss.   Eyes:  Negative for blurred vision.  Respiratory:  Negative for cough.   Cardiovascular:  Negative for chest pain.  Gastrointestinal:  Negative for heartburn.  Genitourinary:  Negative for dysuria.  Musculoskeletal:  Negative for myalgias.  Skin:  Negative for rash.  Neurological:  Negative for dizziness.  Psychiatric/Behavioral:  Positive for depression, hallucinations, substance abuse and  suicidal ideas. Negative for memory loss. The patient is nervous/anxious and has insomnia.    Blood pressure  136/67, pulse (!) 57, temperature 97.8 F (36.6 C), temperature source Oral, resp. rate 16, height 5\' 9"  (1.753 m), weight 80.8 kg, SpO2 100 %. Body mass index is 26.3 kg/m.   COGNITIVE FEATURES THAT CONTRIBUTE TO RISK:  None    SUICIDE RISK:   Moderate:  Frequent suicidal ideation with limited intensity, and duration, some specificity in terms of plans, no associated intent, good self-control, limited dysphoria/symptomatology, some risk factors present, and identifiable protective factors, including available and accessible social support.  PLAN OF CARE: See H & P  I certify that inpatient services furnished can reasonably be expected to improve the patient's condition.   Starleen Blue, NP 02/20/2023, 4:53 PM

## 2023-02-20 NOTE — [facility] Group Notes (Signed)
LCSW Wellness Group Note   02/20/2023 09:30am  Type of Group and Topic: Psychoeducational Group:  Wellness  Participation Level:  Active  Description of Group  Wellness group introduces the topic and its focus on developing healthy habits across the spectrum and its relationship to a decrease in hospital admissions.  Six areas of wellness are discussed: physical, social spiritual, intellectual, occupational, and emotional.  Patients are asked to consider their current wellness habits and to identify areas of wellness where they are interested and able to focus on improvements.    Therapeutic Goals Patients will understand components of wellness and how they can positively impact overall health.  Patients will identify areas of wellness where they have developed good habits. Patients will identify areas of wellness where they would like to make improvements.    Summary of Patient Progress: pt active in group, made several comments during group discussion, attentive.  Pt left the group towards the end before identifying wellness areas of strength or need.       Therapeutic Modalities: Cognitive Behavioral Therapy Psychoeducation    Lorri Frederick, LCSW

## 2023-02-20 NOTE — Group Note (Signed)
Date:  02/20/2023 Time:  5:22 PM  Group Topic/Focus:  Wellness Toolbox:   The focus of this group is to discuss various aspects of wellness, balancing those aspects and exploring ways to increase the ability to experience wellness.  Patients will create a wellness toolbox for use upon discharge.    Participation Level:  Did Not Attend  Participation Quality:    Affect:      Cognitive:      Insight: None  Engagement in Group:    Modes of Intervention:      Additional Comments:     Reymundo Poll 02/20/2023, 5:22 PM
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
## 2023-02-20 NOTE — Group Note (Signed)
Date:  02/20/2023 Time:  4:27 PM  Group Topic/Focus:  Goals Group:   The focus of this group is to help patients establish daily goals to achieve during treatment and discuss how the patient can incorporate goal setting into their daily lives to aide in recovery.    Participation Level:  Did Not Attend  Participation Quality:      Affect:      Cognitive:      Insight: None  Engagement in Group:      Modes of Intervention:      Additional Comments:     Reymundo Poll 02/20/2023, 4:27 PM

## 2023-02-20 NOTE — Progress Notes (Signed)
   02/20/23 0800  Psych Admission Type (Psych Patients Only)  Admission Status Voluntary  Psychosocial Assessment  Patient Complaints None  Eye Contact Fair  Facial Expression Flat  Affect Appropriate to circumstance  Speech Logical/coherent  Interaction Assertive;Superficial  Motor Activity Other (Comment) (Unremarkable.)  Appearance/Hygiene Unremarkable  Behavior Characteristics Cooperative;Anxious  Mood Anxious  Thought Process  Coherency WDL  Content WDL  Delusions None reported or observed  Perception WDL  Hallucination None reported or observed  Judgment Limited  Confusion None  Danger to Self  Current suicidal ideation? Passive  Self-Injurious Behavior No self-injurious ideation or behavior indicators observed or expressed   Agreement Not to Harm Self Yes  Description of Agreement Verbal  Danger to Others  Danger to Others None reported or observed

## 2023-02-21 ENCOUNTER — Encounter (HOSPITAL_COMMUNITY): Payer: Self-pay

## 2023-02-21 DIAGNOSIS — F251 Schizoaffective disorder, depressive type: Secondary | ICD-10-CM | POA: Diagnosis not present

## 2023-02-21 LAB — URINALYSIS, ROUTINE W REFLEX MICROSCOPIC
Bilirubin Urine: NEGATIVE
Glucose, UA: NEGATIVE mg/dL
Hgb urine dipstick: NEGATIVE
Ketones, ur: NEGATIVE mg/dL
Leukocytes,Ua: NEGATIVE
Nitrite: NEGATIVE
Protein, ur: NEGATIVE mg/dL
Specific Gravity, Urine: 1.012 (ref 1.005–1.030)
pH: 5 (ref 5.0–8.0)

## 2023-02-21 LAB — LIPID PANEL
Cholesterol: 139 mg/dL (ref 0–200)
HDL: 46 mg/dL (ref >40)
LDL Cholesterol: 69 mg/dL (ref 0–99)
Total CHOL/HDL Ratio: 3 RATIO
Triglycerides: 120 mg/dL (ref <150)
VLDL: 24 mg/dL (ref 0–40)

## 2023-02-21 LAB — TSH: TSH: 3.721 u[IU]/mL (ref 0.350–4.500)

## 2023-02-21 LAB — VITAMIN D 25 HYDROXY (VIT D DEFICIENCY, FRACTURES): Vit D, 25-Hydroxy: 34.1 ng/mL (ref 30–100)

## 2023-02-21 LAB — HEMOGLOBIN A1C
Hgb A1c MFr Bld: 5.3 % (ref 4.8–5.6)
Mean Plasma Glucose: 105.41 mg/dL

## 2023-02-21 NOTE — BHH Counselor (Signed)
B[facility]/BMU LCSW Progress Note   02/21/2023    12:10 PM  Cole [name]      Type of Note: Residential/ [employer]    CSW provided patient with his choice of residential placements for his cocaine use. Patient [place] for [place] housing, [facility], [facility] in [place], and [facility]. CSW explained to patient that he would need to arrange his own transportation for [facility] and he agreed stating , " I have family there ".     Signed:   Jacob Moores, MSW, [facility] 02/21/2023 12:10 PM

## 2023-02-21 NOTE — Progress Notes (Signed)
   02/21/23 0830  Psych Admission Type (Psych Patients Only)  Admission Status Voluntary  Psychosocial Assessment  Patient Complaints None  Eye Contact Fair  Facial Expression Flat  Affect Appropriate to circumstance  Speech Logical/coherent  Interaction Assertive  Motor Activity Other (Comment) (Unremarkable.)  Appearance/Hygiene Unremarkable  Behavior Characteristics Cooperative;Appropriate to situation  Mood Anxious;Pleasant  Thought Process  Coherency WDL  Content WDL  Delusions None reported or observed  Perception WDL  Hallucination None reported or observed  Judgment Limited  Confusion None  Danger to Self  Current suicidal ideation? Denies  Self-Injurious Behavior No self-injurious ideation or behavior indicators observed or expressed   Agreement Not to Harm Self Yes  Description of Agreement Verbal  Danger to Others  Danger to Others None reported or observed

## 2023-02-21 NOTE — Group Note (Signed)
Occupational Therapy Group Note  Group Topic: Sleep Hygiene  Group Date: 02/21/2023 Start Time: 1430 End Time: 1500 Facilitators: Sareen Randon G, OT   Group Description: Group encouraged increased participation and engagement through topic focused on sleep hygiene. Patients reflected on the quality of sleep they typically receive and identified areas that need improvement. Group was given background information on sleep and sleep hygiene, including common sleep disorders. Group members also received information on how to improve one's sleep and introduced a sleep diary as a tool that can be utilized to track sleep quality over a length of time. Group session ended with patients identifying one or more strategies they could utilize or implement into their sleep routine in order to improve overall sleep quality.        Therapeutic Goal(s):  Identify one or more strategies to improve overall sleep hygiene  Identify one or more areas of sleep that are negatively impacted (sleep too much, too little, etc)     Participation Level: Engaged   Participation Quality: Independent   Behavior: Appropriate   Speech/Thought Process: Relevant   Affect/Mood: Appropriate   Insight: Fair   Judgement: Fair      Modes of Intervention: Education  Patient Response to Interventions:  Attentive   Plan: Continue to engage patient in OT groups 2 - 3x/week.  02/21/2023  Young Brim G Leeandre Nordling, OT Saraia Platner, OT    
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
## 2023-02-21 NOTE — Group Note (Signed)
Recreation Therapy Group Note   Group Topic:Problem Solving  Group Date: 02/21/2023 Start Time: 0930 End Time: 1000 Facilitators: Lorriane Dehart-McCall, LRT,CTRS Location: [address] Dayroom   Goal Area(s) Addresses:  Patient will effectively work with peer towards shared goal.  Patient will identify skills used to make activity successful.  Patient will share challenges and verbalize solution-driven approaches used. Patient will identify how skills used during activity can be used to reach post d/c goals.   Group Description: [employer]. Patients were provided the following materials: 4 drinking straws, 5 rubber bands, 5 paper clips, 2 index cards and 2 drinking cups. Using the provided materials patients were asked to build a launching mechanism to launch a ping pong ball across the room, approximately 10 feet. Patients were divided into teams of 3-5. Instructions required all materials be incorporated into the device, functionality of items left to the peer group's discretion.   Affect/Mood: N/A   Participation Level: Did not attend    Clinical Observations/Individualized Feedback:     Plan: Continue to engage patient in RT group sessions 2-3x/week.   Lam Mccubbins-McCall, LRT,CTRS 02/21/2023 1:11 PM
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
## 2023-02-21 NOTE — BH IP Treatment Plan (Signed)
Interdisciplinary Treatment and Diagnostic Plan Update  02/21/2023 Time of Session: 10:55 AM  Cole [name] MRN: [id number]  Principal Diagnosis: Schizoaffective disorder [facility])  Secondary Diagnoses: Principal Problem:   Schizoaffective disorder (HCC) Active Problems:   Insomnia   Anxiety state   Current Medications:  Current Facility-Administered Medications  Medication Dose Route Frequency Provider Last Rate Last Admin   alum & mag hydroxide-simeth (MAALOX/MYLANTA) 200-200-20 MG/5ML suspension 30 mL  30 mL Oral Q4H PRN Bobbitt, Shalon E, NP       buprenorphine-naloxone (SUBOXONE) 8-2 mg per SL tablet 1 tablet  1 tablet Sublingual BID Starleen Blue, NP   1 tablet at 02/21/23 0830   diphenhydrAMINE (BENADRYL) capsule 50 mg  50 mg Oral TID PRN Bobbitt, Shalon E, NP       Or   diphenhydrAMINE (BENADRYL) injection 50 mg  50 mg Intramuscular TID PRN Bobbitt, Shalon E, NP       gabapentin (NEURONTIN) capsule 800 mg  800 mg Oral TID Starleen Blue, NP   800 mg at 02/21/23 0830   haloperidol (HALDOL) tablet 5 mg  5 mg Oral TID PRN Bobbitt, Shalon E, NP       Or   haloperidol lactate (HALDOL) injection 5 mg  5 mg Intramuscular TID PRN Bobbitt, Shalon E, NP       LORazepam (ATIVAN) tablet 2 mg  2 mg Oral TID PRN Bobbitt, Shalon E, NP       Or   LORazepam (ATIVAN) injection 2 mg  2 mg Intramuscular TID PRN Bobbitt, Shalon E, NP       magnesium hydroxide (MILK OF MAGNESIA) suspension 30 mL  30 mL Oral Daily PRN Bobbitt, Shalon E, NP   30 mL at 02/19/23 1404   nicotine polacrilex (NICORETTE) gum 2 mg  2 mg Oral PRN Rex Kras, MD   2 mg at 02/21/23 0951   risperiDONE (RISPERDAL M-TABS) disintegrating tablet 1 mg  1 mg Oral BID Starleen Blue, NP   1 mg at 02/21/23 0830   traZODone (DESYREL) tablet 50 mg  50 mg Oral QHS PRN Bobbitt, Shalon E, NP   50 mg at 02/20/23 2059   valbenazine (INGREZZA) capsule 40 mg  40 mg Oral Daily Starleen Blue, NP   40 mg at 02/21/23 0830   PTA  Medications: Medications Prior to Admission  Medication Sig Dispense Refill Last Dose   ALPRAZolam (XANAX) 1 MG tablet Take 1 mg by mouth 3 (three) times daily as needed for anxiety.      baclofen (LIORESAL) 10 MG tablet Take 10 mg by mouth 3 (three) times daily as needed for muscle spasms.      buprenorphine-naloxone (SUBOXONE) 8-2 mg SUBL SL tablet Place 1 tablet under the tongue 2 (two) times daily.      gabapentin (NEURONTIN) 800 MG tablet Take 800 mg by mouth 3 (three) times daily.      meloxicam (MOBIC) 15 MG tablet Take 15 mg by mouth daily. (Patient not taking: Reported on 02/18/2023)       Patient Stressors: Financial difficulties   Marital or family conflict   Occupational concerns    Patient Strengths: Average or above average intelligence  Capable of independent living  General fund of knowledge   Treatment Modalities: Medication Management, Group therapy, Case management,  1 to 1 session with clinician, Psychoeducation, Recreational therapy.   Physician Treatment Plan for Primary Diagnosis: Schizoaffective disorder [facility]) Long Term Goal(s): Improvement in symptoms so as ready for discharge   Short  Term Goals: Ability to identify changes in lifestyle to reduce recurrence of condition will improve Ability to verbalize feelings will improve Ability to disclose and discuss suicidal ideas Ability to identify and develop effective coping behaviors will improve Ability to maintain clinical measurements within normal limits will improve Compliance with prescribed medications will improve Ability to identify triggers associated with substance abuse/mental health issues will improve  Medication Management: Evaluate patient's response, side effects, and tolerance of medication regimen.  Therapeutic Interventions: 1 to 1 sessions, Unit Group sessions and Medication administration.  Evaluation of Outcomes: Not Progressing  Physician Treatment Plan for Secondary Diagnosis:  Principal Problem:   Schizoaffective disorder (HCC) Active Problems:   Insomnia   Anxiety state  Long Term Goal(s): Improvement in symptoms so as ready for discharge   Short Term Goals: Ability to identify changes in lifestyle to reduce recurrence of condition will improve Ability to verbalize feelings will improve Ability to disclose and discuss suicidal ideas Ability to identify and develop effective coping behaviors will improve Ability to maintain clinical measurements within normal limits will improve Compliance with prescribed medications will improve Ability to identify triggers associated with substance abuse/mental health issues will improve     Medication Management: Evaluate patient's response, side effects, and tolerance of medication regimen.  Therapeutic Interventions: 1 to 1 sessions, Unit Group sessions and Medication administration.  Evaluation of Outcomes: Not Progressing   RN Treatment Plan for Primary Diagnosis: Schizoaffective disorder (HCC) Long Term Goal(s): Knowledge of disease and therapeutic regimen to maintain health will improve  Short Term Goals: Ability to remain free from injury will improve, Ability to verbalize frustration and anger appropriately will improve, Ability to demonstrate self-control, Ability to participate in decision making will improve, Ability to verbalize feelings will improve, Ability to disclose and discuss suicidal ideas, Ability to identify and develop effective coping behaviors will improve, and Compliance with prescribed medications will improve  Medication Management: RN will administer medications as ordered by provider, will assess and evaluate patient's response and provide education to patient for prescribed medication. RN will report any adverse and/or side effects to prescribing provider.  Therapeutic Interventions: 1 on 1 counseling sessions, Psychoeducation, Medication administration, Evaluate responses to treatment,  Monitor vital signs and CBGs as ordered, Perform/monitor CIWA, COWS, AIMS and Fall Risk screenings as ordered, Perform wound care treatments as ordered.  Evaluation of Outcomes: Not Progressing   LCSW Treatment Plan for Primary Diagnosis: Schizoaffective disorder (HCC) Long Term Goal(s): Safe transition to appropriate next level of care at discharge, Engage patient in therapeutic group addressing interpersonal concerns.  Short Term Goals: Engage patient in aftercare planning with referrals and resources, Increase social support, Increase ability to appropriately verbalize feelings, Increase emotional regulation, Facilitate acceptance of mental health diagnosis and concerns, Facilitate patient progression through stages of change regarding substance use diagnoses and concerns, Identify triggers associated with mental health/substance abuse issues, and Increase skills for wellness and recovery  Therapeutic Interventions: Assess for all discharge needs, 1 to 1 time with Social worker, Explore available resources and support systems, Assess for adequacy in community support network, Educate family and significant other(s) on suicide prevention, Complete Psychosocial Assessment, Interpersonal group therapy.  Evaluation of Outcomes: Not Progressing   Progress in Treatment: Attending groups: No. Participating in groups: No. Taking medication as prescribed: Yes. Toleration medication: Yes. Family/Significant other contact made: No, will contact:  Cole [name] ( mom ) [phone] Patient understands diagnosis: No. Discussing patient identified problems/goals with staff: Yes. Medical problems stabilized or resolved: Yes. Denies  suicidal/homicidal ideation: Yes. Issues/concerns per patient self-inventory: No.   New problem(s) identified: No, Describe:  None reported  New Short Term/Long Term Goal(s):detox, medication management for mood stabilization; elimination of SI thoughts; development of  comprehensive mental wellness/sobriety plan   Patient Goals:  " Do better, stay positive, take my medication, go to group, and socialize "   Discharge Plan or Barriers: Patient recently admitted. CSW will continue to follow and assess for appropriate referrals and possible discharge planning.    Reason for Continuation of Hospitalization: Anxiety Depression Medication stabilization Suicidal ideation Withdrawal symptoms  Estimated Length of Stay: 3-5 days   Last 3 [place] Suicide Severity Risk Score: Flowsheet Row Ad[facility] (Current) [facility] from [facility]18/2023 [facility] ED from 02/14/2023 in [facility] Emergency Department at [facility]  C-SSRS RISK CATEGORY High Risk High Risk High Risk       Last [facility] 2/9 Scores:     No data to display          Scribe for Treatment Team: Beather Arbour 02/21/2023 3:34 PM

## 2023-02-21 NOTE — Progress Notes (Signed)
Adult Psychoeducational Group Note  Date:  02/21/2023 Time:  1:39 AM  Group Topic/Focus:  Wrap-Up Group:   The focus of this group is to help patients review their daily goal of treatment and discuss progress on daily workbooks.  Participation Level:  Active  Participation Quality:  Appropriate  Affect:  Appropriate  Cognitive:  Appropriate  Insight: Appropriate  Engagement in Group:  Engaged  Modes of Intervention:  Discussion  Additional Comments:  Pt. Stated day was 7 out of 10. Pt. Stated today was positive because they spoke with doctor Pt said goal for tomorrow is to is to talk to more doctor for treatment .   Cole [name] 02/21/2023, 1:39 AM

## 2023-02-21 NOTE — Plan of Care (Signed)
  Problem: Education: Goal: Knowledge of [employer] [facility] Education information/materials will improve Outcome: Progressing   Problem: Education: Goal: Mental status will improve Outcome: Progressing   Problem: Health Behavior/Discharge Planning: Goal: Compliance with treatment plan for underlying cause of condition will improve Outcome: Progressing   Problem: Safety: Goal: Periods of time without injury will increase Outcome: Progressing   

## 2023-02-21 NOTE — Progress Notes (Signed)
   02/21/23 0545  15 Minute Checks  Location Bedroom  Visual Appearance Calm  Behavior Sleeping  Sleep (Behavioral Health Patients Only)  Calculate sleep? (Click Yes once per 24 hr at 0600 safety check) Yes  Documented sleep last 24 hours 8

## 2023-02-21 NOTE — Progress Notes (Signed)
   02/20/23 2100  Psych Admission Type (Psych Patients Only)  Admission Status Voluntary  Psychosocial Assessment  Patient Complaints Anxiety;Depression;Hopelessness  Eye Contact Fair  Facial Expression Flat  Affect Appropriate to circumstance  Speech Logical/coherent  Interaction Assertive  Motor Activity Slow  Appearance/Hygiene Unremarkable  Behavior Characteristics Cooperative;Appropriate to situation  Mood Anxious  Thought Process  Coherency WDL  Content WDL  Delusions None reported or observed  Perception WDL  Hallucination None reported or observed  Judgment Limited  Confusion None  Danger to Self  Current suicidal ideation? Passive  Self-Injurious Behavior No self-injurious ideation or behavior indicators observed or expressed   Agreement Not to Harm Self Yes  Description of Agreement verbal contract  Danger to Others  Danger to Others None reported or observed

## 2023-02-21 NOTE — Group Note (Unsigned)
Date:  02/21/2023 Time:  10:27 AM  Group Topic/Focus:  Goals Group:   The focus of this group is to help patients establish daily goals to achieve during treatment and discuss how the patient can incorporate goal setting into their daily lives to aide in recovery.     Participation Level:  {BHH PARTICIPATION ZYSAY:30160}  Participation Quality:  {BHH PARTICIPATION QUALITY:22265}  Affect:  {BHH AFFECT:22266}  Cognitive:  {BHH COGNITIVE:22267}  Insight: {BHH Insight2:20797}  Engagement in Group:  {BHH ENGAGEMENT IN FUXNA:35573}  Modes of Intervention:  {BHH MODES OF INTERVENTION:22269}  Additional Comments:  ***  Reymundo Poll 02/21/2023, 10:27 AM

## 2023-02-21 NOTE — Progress Notes (Signed)
Adult Psychoeducational Group Note  Date:  02/21/2023 Time:  11:01 PM  Group Topic/Focus:  Wrap-Up Group:   The focus of this group is to help patients review their daily goal of treatment and discuss progress on daily workbooks.  Participation Level:  Did Not Attend  Participation Quality:  Appropriate  Affect:  Appropriate  Cognitive:  Appropriate  Insight: None  Engagement in Group:  None  Modes of Intervention:  Discussion  Additional Comments:  Pt, did not attend group.  Joselyn Arrow 02/21/2023, 11:01 PM

## 2023-02-22 DIAGNOSIS — F251 Schizoaffective disorder, depressive type: Secondary | ICD-10-CM | POA: Diagnosis not present
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
## 2023-02-22 NOTE — Group Note (Signed)
Recreation Therapy Group Note   Group Topic:Animal Assisted Therapy   Group Date: 02/22/2023 Start Time: 0945 End Time: 1031 Facilitators: Malaky Tetrault-McCall, LRT,CTRS Location: [address] Dayroom   Animal-Assisted Activity (AAA) Program Checklist/Progress Notes Patient Eligibility Criteria Checklist & Daily Group note for Rec Tx Intervention  AAA/T Program Assumption of Risk Form signed by Patient/ or Parent Legal Guardian Yes  Patient is free of allergies or severe asthma Yes  Patient reports no fear of animals Yes  Patient reports no history of cruelty to animals Yes  Patient understands his/her participation is voluntary Yes  Patient washes hands before animal contact Yes  Patient washes hands after animal contact Yes   Affect/Mood: Appropriate   Participation Level: Moderate   Participation Quality: Independent   Behavior: Appropriate   Speech/Thought Process: Focused   Insight: Good   Judgement: Good   Modes of Intervention: Teaching laboratory technician   Patient Response to Interventions:  Receptive   Education Outcome:  Acknowledges education   Clinical Observations/Individualized Feedback: Patient attended session and interacted appropriately with therapy dog and peers. Patient asked appropriate questions about therapy dog and his training. Patient shared stories about their pets at home with group.    Plan: Continue to engage patient in RT group sessions 2-3x/week.   Remmy Crass-McCall, LRT,CTRS 02/22/2023 1:44 PM

## 2023-02-22 NOTE — Progress Notes (Signed)
   02/21/23 2120  Psych Admission Type (Psych Patients Only)  Admission Status Voluntary  Psychosocial Assessment  Patient Complaints Anxiety;Depression  Eye Contact Fair  Facial Expression Flat  Affect Appropriate to circumstance  Speech Logical/coherent  Interaction Assertive  Motor Activity Other (Comment)  Appearance/Hygiene Unremarkable  Behavior Characteristics Cooperative;Appropriate to situation  Mood Anxious;Depressed  Thought Process  Coherency WDL  Content WDL  Delusions None reported or observed  Perception WDL  Hallucination None reported or observed  Judgment Limited  Confusion None  Danger to Self  Current suicidal ideation? Denies  Self-Injurious Behavior No self-injurious ideation or behavior indicators observed or expressed   Agreement Not to Harm Self Yes  Description of Agreement verbal contract  Danger to Others  Danger to Others None reported or observed

## 2023-02-22 NOTE — Plan of Care (Signed)
  Problem: Education: Goal: Knowledge of [place] [facility] Education information/materials will improve Outcome: Progressing Goal: Emotional status will improve Outcome: Progressing   Problem: Education: Goal: Knowledge of [place] [facility] Education information/materials will improve Outcome: Progressing   Problem: Education: Goal: Emotional status will improve Outcome: Progressing   Problem: Coping: Goal: Ability to demonstrate self-control will improve Outcome: Progressing

## 2023-02-22 NOTE — Group Note (Signed)
Date:  02/22/2023 Time:  12:25 PM  Group Topic/Focus:  Coping With Mental Health Crisis:   The purpose of this group is to help patients identify strategies for coping with mental health crisis.  Group discusses possible causes of crisis and ways to manage them effectively. Early Warning Signs:   The focus of this group is to help patients identify signs or symptoms they exhibit before slipping into an unhealthy state or crisis.    Participation Level:  Active  Participation Quality:  Appropriate  Affect:  Appropriate  Cognitive:  Appropriate  Insight: Appropriate  Engagement in Group:  Engaged  Modes of Intervention:  Discussion, Exploration, Socialization, and Support  Additional Comments:    Memory Dance Cole [name] 02/22/2023, 12:25 PM

## 2023-02-22 NOTE — Plan of Care (Signed)
  Problem: Education: Goal: Knowledge of [place] [facility] Education information/materials will improve Outcome: Progressing   Problem: Education: Goal: Emotional status will improve Outcome: Progressing   Problem: Activity: Goal: Sleeping patterns will improve Outcome: Progressing   Problem: Safety: Goal: Periods of time without injury will increase Outcome: Progressing   

## 2023-02-22 NOTE — Progress Notes (Signed)
   02/22/23 0544  15 Minute Checks  Location Bedroom  Visual Appearance Calm  Behavior Composed  Sleep (Behavioral Health Patients Only)  Calculate sleep? (Click Yes once per 24 hr at 0600 safety check) Yes  Documented sleep last 24 hours 7.5

## 2023-02-22 NOTE — Progress Notes (Signed)
   02/22/23 1712  Spiritual Encounters  Type of Visit Initial  Care provided to: Patient  Referral source Patient request  Reason for visit Routine spiritual support  OnCall Visit No  Spiritual Framework  Presenting Themes Values and beliefs;Significant life change;Impactful experiences and emotions;Courage hope and growth  Values/beliefs trust in God  Patient Stress Factors Financial concerns;Major life changes  Family Stress Factors Not reviewed  Interventions  Spiritual Care Interventions Made Established relationship of care and support;Compassionate presence;Reflective listening;Normalization of emotions;Narrative/life review;Prayer;Encouragement  Intervention Outcomes  Outcomes Connection to spiritual care;Awareness of support;Reduced anxiety;Reduced isolation   Patient discussed his not having a place to stay, waiting for his disability to "come through," the need to pay child support and his lack of transportation. Patient had felt "hopeless" and "suicidal." Patient states that now he has hope. He spoke briefly about a program in [place] he may be a part of. Patient asked if he could talk to chaplain again. Chaplain let patient know to request a visit through staff.   Arlyce Dice, Chaplain Resident

## 2023-02-22 NOTE — Progress Notes (Signed)
   02/22/23 2100  Psych Admission Type (Psych Patients Only)  Admission Status Voluntary  Psychosocial Assessment  Patient Complaints Anxiety;Depression;Hopelessness  Eye Contact Fair  Facial Expression Flat  Affect Appropriate to circumstance  Speech Logical/coherent  Interaction Assertive  Motor Activity Slow  Appearance/Hygiene Improved  Behavior Characteristics Cooperative;Appropriate to situation  Mood Anxious;Depressed  Thought Process  Coherency WDL  Content WDL  Delusions None reported or observed  Perception WDL  Hallucination None reported or observed  Judgment Limited  Confusion None  Danger to Self  Current suicidal ideation? Denies  Self-Injurious Behavior No self-injurious ideation or behavior indicators observed or expressed   Agreement Not to Harm Self Yes  Description of Agreement Verbal contract  Danger to Others  Danger to Others None reported or observed

## 2023-02-22 NOTE — Progress Notes (Signed)
Patient alert and oriented x 4. Patient cooperative with care. Patient rated his anxiety 4/10, depression 5/10. Patient denies SI,HI, & AVH. Patient compliant with his medications, no side effects reported. Patient attended group activities and participating. Support and encouragement provided. Patient kept safe on the unit, Q 15 minutes safety checks maintained.

## 2023-02-22 NOTE — Progress Notes (Signed)
   02/22/23 0800  Psych Admission Type (Psych Patients Only)  Admission Status Voluntary  Psychosocial Assessment  Patient Complaints Anxiety;Depression;Irritability  Eye Contact Fair  Facial Expression Flat  Affect Appropriate to circumstance  Speech Logical/coherent  Interaction Assertive  Motor Activity Slow  Appearance/Hygiene Poor hygiene  Behavior Characteristics Cooperative;Appropriate to situation  Mood Anxious  Thought Process  Coherency WDL  Content WDL  Delusions None reported or observed  Perception WDL  Hallucination None reported or observed  Judgment Limited  Confusion None  Danger to Self  Current suicidal ideation? Denies  Self-Injurious Behavior No self-injurious ideation or behavior indicators observed or expressed   Agreement Not to Harm Self Yes  Description of Agreement verbal  Danger to Others  Danger to Others None reported or observed

## 2023-02-23 DIAGNOSIS — F251 Schizoaffective disorder, depressive type: Secondary | ICD-10-CM | POA: Diagnosis not present

## 2023-02-23 NOTE — Progress Notes (Signed)
Adult Psychoeducational Group Note  Date:  02/23/2023 Time:  2:22 AM  Group Topic/Focus:  Wrap-Up Group:   The focus of this group is to help patients review their daily goal of treatment and discuss progress on daily workbooks.  Participation Level:  Active  Participation Quality:  Appropriate  Affect:  Appropriate  Cognitive:  Appropriate  Insight: Appropriate  Engagement in Group:  Engaged  Modes of Intervention:  Discussion  Additional Comments:  Pt stated day was a 5 out of 10.Pt stated day was positive because they spoke with a friend.Joselyn Arrow 02/23/2023, 2:22 AM

## 2023-02-23 NOTE — [facility] Suicide Risk Assessment (Signed)
[facility] INPATIENT:  Family/Significant Other Suicide Prevention Education  Suicide Prevention Education:  Education Completed; Taje Littler, [phone]    (name of family member/significant other) has been identified by the patient as the family member/significant other with whom the patient will be residing, and identified as the person(s) who will aid the patient in the event of a mental health crisis (suicidal ideations/suicide attempt).  With written consent from the patient, the family member/significant other has been provided the following suicide prevention education, prior to the and/or following the discharge of the patient.  CSW spoke with patient mother who states that he has an extensive drug problem.  He can come home after he gets appropriate treatment as she can not tolerate drugs in her house.  She has no additional safety concerns and patient does not have access to guns/weapons.   The suicide prevention education provided includes the following: Suicide risk factors Suicide prevention and interventions National Suicide Hotline telephone number [facility] assessment telephone number [facility] Emergency Assistance 911 [facility] and/or Residential Mobile Crisis Unit telephone number  Request made of family/significant other to: Remove weapons (e.g., guns, rifles, knives), all items previously/currently identified as safety concern.   Remove drugs/medications (over-the-counter, prescriptions, illicit drugs), all items previously/currently identified as a safety concern.  The family member/significant other verbalizes understanding of the suicide prevention education information provided.  The family member/significant other agrees to remove the items of safety concern listed above.  Mickeal Daws E Hayzlee Mcsorley 02/23/2023, 2:26 PM

## 2023-02-23 NOTE — Progress Notes (Signed)
HR- 52, Gatorade  one cup given.
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
## 2023-02-23 NOTE — Progress Notes (Signed)
   02/23/23 0556  15 Minute Checks  Location Bedroom  Visual Appearance Calm  Behavior Sleeping  Sleep (Behavioral Health Patients Only)  Calculate sleep? (Click Yes once per 24 hr at 0600 safety check) Yes  Documented sleep last 24 hours 4.5

## 2023-02-23 NOTE — Group Note (Signed)
Recreation Therapy Group Note   Group Topic:Stress Management  Group Date: 02/23/2023 Start Time: 0935 End Time: 1000 Facilitators: Brittley Regner-McCall, LRT,CTRS Location: [address] Dayroom   Goal Area(s) Addresses:  Patient will actively participate in stress management techniques presented during session.  Patient will successfully identify benefit of practicing stress management post d/c.   Group Description: Guided Imagery. LRT provided education, instruction, and demonstration on practice of visualization via guided imagery. Patient was asked to participate in the technique introduced during session. LRT debriefed including topics of mindfulness, stress management and specific scenarios each patient could use these techniques. Patients were given suggestions of ways to access scripts post d/c and encouraged to explore Youtube and other apps available on smartphones, tablets, and computers.   Affect/Mood: Appropriate   Participation Level: Engaged   Participation Quality: Independent   Behavior: Appropriate   Speech/Thought Process: Focused   Insight: Good   Judgement: Good   Modes of Intervention: Script, [place]   Patient Response to Interventions:  Engaged   Education Outcome:  Acknowledges education   Clinical Observations/Individualized Feedback: Pt attended and participated in group session.    Plan: Continue to engage patient in RT group sessions 2-3x/week.   Desteny Freeman-McCall, LRT,CTRS 02/23/2023 12:19 PM

## 2023-02-23 NOTE — [facility] Group Notes (Signed)
[facility] Group Notes:  (Nursing/MHT/Case Management/Adjunct)  Date:  02/23/2023  Time:  2000  Type of Therapy:   Narcotics Anonymous Meeting  Participation Level:  Active  Participation Quality:  Appropriate and Supportive  Affect:  Anxious and Flat  Cognitive:  Alert  Insight:  Improving  Engagement in Group:  Developing/Improving  Modes of Intervention:  Education and Support  Summary of Progress/Problems:   Marcille Buffy 02/23/2023, 10:01 PM

## 2023-02-23 NOTE — [facility] Counselor (Signed)
[facility] LCSW Progress Note   02/23/2023    2:18 PM  Cole [name]   [id number]   Type of Contact and Topic:  [facility] referral  Patient denied from [facility] due to insurance.   Provided resources to [place], [employer],   [facility] referral still in review.     Signed:  Anson [place] MSW, LCSW, LCAS 02/23/2023 2:18 PM

## 2023-02-23 NOTE — Progress Notes (Addendum)
Pt denied SI/HI/AVH this morning. Pt has been pleasant, calm, and cooperative throughout the shift. Pt given scheduled medications as prescribed. Q15 min checks verified for safety. Patient verbally contracts for safety. Patient compliant with medications and treatment plan. Patient is interacting well on the unit. Pt is safe on the unit.   02/23/23 0908  Psych Admission Type (Psych Patients Only)  Admission Status Voluntary  Psychosocial Assessment  Patient Complaints None  Eye Contact Fair  Facial Expression Flat  Affect Appropriate to circumstance  Speech Logical/coherent  Interaction Assertive  Motor Activity Slow  Appearance/Hygiene Unremarkable  Behavior Characteristics Cooperative;Appropriate to situation  Mood Depressed  Thought Process  Coherency WDL  Content WDL  Delusions None reported or observed  Perception WDL  Hallucination None reported or observed  Judgment Impaired  Confusion None  Danger to Self  Current suicidal ideation? Denies  Self-Injurious Behavior No self-injurious ideation or behavior indicators observed or expressed   Agreement Not to Harm Self Yes  Description of Agreement Pt verbally contracts for safety  Danger to Others  Danger to Others None reported or observed

## 2023-02-24 DIAGNOSIS — F250 Schizoaffective disorder, bipolar type: Secondary | ICD-10-CM | POA: Diagnosis not present

## 2023-02-24 DIAGNOSIS — G2401 Drug induced subacute dyskinesia: Secondary | ICD-10-CM

## 2023-02-24 HISTORY — DX: Drug induced subacute dyskinesia: G24.01
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
## 2023-02-24 NOTE — Progress Notes (Addendum)
D: Pt denied SI/HI/AVH this morning. Pt rated his depression a 4/10, anxiety a 4/10, and feelings of hopelessness a 4/10. Pt reports he slept "fair" last night. Pt complains of mid-lower back pain this shift, heat pack provided to patient. Pt has been pleasant, calm, and cooperative throughout the shift.   A: RN provided support and encouragement to patient. Pt given scheduled medications as prescribed. Q15 min checks verified for safety.    R: Patient verbally contracts for safety. Patient compliant with medications and treatment plan. Patient is interacting well on the unit. Pt is safe on the unit.   02/24/23 0946  Psych Admission Type (Psych Patients Only)  Admission Status Voluntary  Psychosocial Assessment  Patient Complaints None  Eye Contact Fair  Facial Expression Flat  Affect Depressed;Flat  Speech Logical/coherent  Interaction Assertive  Motor Activity Fidgety  Appearance/Hygiene Unremarkable  Behavior Characteristics Cooperative;Appropriate to situation  Mood Depressed;Anxious  Thought Process  Coherency WDL  Content WDL  Delusions None reported or observed  Perception WDL  Hallucination None reported or observed  Judgment Impaired  Confusion None  Danger to Self  Current suicidal ideation? Denies  Self-Injurious Behavior No self-injurious ideation or behavior indicators observed or expressed   Agreement Not to Harm Self Yes  Description of Agreement Pt verbally contracts for safety  Danger to Others  Danger to Others None reported or observed  Danger to Others Abnormal  Harmful Behavior to others No threats or harm toward other people  Destructive Behavior No threats or harm toward property

## 2023-02-24 NOTE — Progress Notes (Signed)
   02/24/23 2007  Psych Admission Type (Psych Patients Only)  Admission Status Voluntary  Psychosocial Assessment  Patient Complaints None  Eye Contact Fair  Facial Expression Flat  Affect Appropriate to circumstance  Speech Logical/coherent  Interaction Assertive  Motor Activity Slow  Appearance/Hygiene Unremarkable  Behavior Characteristics Cooperative;Appropriate to situation  Mood Depressed;Anxious  Thought Process  Coherency WDL  Content WDL  Delusions None reported or observed  Perception WDL  Hallucination None reported or observed  Judgment Impaired  Confusion None  Danger to Self  Current suicidal ideation? Denies  Self-Injurious Behavior No self-injurious ideation or behavior indicators observed or expressed   Agreement Not to Harm Self Yes  Description of Agreement verbal  Danger to Others  Danger to Others None reported or observed

## 2023-02-24 NOTE — Progress Notes (Addendum)
[facility] LCSW Progress Note   02/24/2023    2:38 PM  DIANTE BARLEY   [id number]   Type of Contact and Topic:  Discharge Planning  CSW notified by provider, Nadir Abbott Pao, that he had spoken to [facility] and uncle agreed that patient can stay with them.  He would need a taxi to get to [place] and follow up [facility] in that area.  CSW spoke with patient and patient declined consent for social worker to speak to Kateri Mc and has elected for social worker to get him to [employer] in [place], [place].  CSW let him know that that does not guarantee that he will be able to stay for the night.  Patient demonstrates understanding.  CSW notified physician.   CSW provided patient with homeless packet resources.    Addendum: Physician talked with patient and discussed with patient that patient plan is to stay in the area and apply for jobs and then calling Uncle to pick him up from the [facility].  CSW called Uncle and Uncle agreed to this plan.  Uncle requested for patient to call him to make sure they can connect after discharge.  CSW notified patient and patient agreed to call Uncle.     Signed:  Anson Oregon MSW, LCSW, LCAS 02/24/2023 2:38 PM

## 2023-02-24 NOTE — B[facility] Group Notes (Signed)
Adult Psychoeducational Group Note  Date:  02/24/2023 Time:  9:09 PM  Group Topic/Focus:  Wrap-Up Group:   The focus of this group is to help patients review their daily goal of treatment and discuss progress on daily workbooks.  Participation Level:  Active  Participation Quality:  Attentive  Affect:  Appropriate  Cognitive:  Alert  Insight: Appropriate  Engagement in Group:  Engaged  Modes of Intervention:  Discussion  Additional Comments:  Patient attended and participated in the Wrap-up group.  Jearl Klinefelter 02/24/2023, 9:09 PM

## 2023-02-24 NOTE — Plan of Care (Signed)
  Problem: Education: Goal: Emotional status will improve Outcome: Progressing Goal: Mental status will improve Outcome: Progressing   Problem: Activity: Goal: Interest or engagement in activities will improve Outcome: Progressing   Problem: Coping: Goal: Ability to demonstrate self-control will improve Outcome: Progressing   Problem: Health Behavior/Discharge Planning: Goal: Compliance with treatment plan for underlying cause of condition will improve Outcome: Progressing   

## 2023-02-24 NOTE — Plan of Care (Signed)
   Problem: Education: Goal: Emotional status will improve Outcome: Progressing Goal: Mental status will improve Outcome: Progressing   Problem: Coping: Goal: Ability to demonstrate self-control will improve Outcome: Progressing   

## 2023-02-24 NOTE — Group Note (Signed)
Date:  02/24/2023 Time:  12:17 PM  Group Topic/Focus:  Recovery Goals:   The focus of this group is to identify appropriate goals for recovery and establish a plan to achieve them. Stages of Change:   The focus of this group is to explain the stages of change and help patients identify changes they want to make upon discharge.    Participation Level:  Active  Participation Quality:  Drowsy  Affect:  Lethargic  Cognitive:  Appropriate  Insight: Appropriate  Engagement in Group:  Limited  Modes of Intervention:  Discussion, Education, and Exploration  Additional Comments:    Memory Dance Cathryne Mancebo 02/24/2023, 12:17 PM

## 2023-02-24 NOTE — B[facility] Group Notes (Signed)
ADULT GRIEF GROUP NOTE:  Spiritual care group on grief and loss facilitated by chaplain Burnis Kingfisher MDiv, B[facility]  Group Goal:  Support / Education around grief and loss Members engage in facilitated group support and psycho-social education.  Group Description:  Following introductions and group rules, group members engaged in facilitated group dialog and support around topic of loss, with particular support around experiences of loss in their lives. Group Identified types of loss (relationships / self / things) and identified patterns, circumstances, and changes that precipitate losses. Reflected on thoughts / feelings around loss, normalized grief responses, and recognized variety in grief experience.   Group noted Worden's four tasks of grief in discussion.  Group drew on Adlerian / Rogerian, narrative, MI, Patient Progress:  Present at group introductions.  Left group room at beginning of group.  Did not return

## 2023-02-24 NOTE — Progress Notes (Signed)
   02/23/23 2120  Psych Admission Type (Psych Patients Only)  Admission Status Voluntary  Psychosocial Assessment  Patient Complaints None  Eye Contact Fair  Facial Expression Flat  Affect Appropriate to circumstance  Speech Logical/coherent  Interaction Assertive  Motor Activity Slow  Appearance/Hygiene Unremarkable  Behavior Characteristics Cooperative;Appropriate to situation  Mood Depressed  Thought Process  Coherency WDL  Content WDL  Delusions None reported or observed  Perception WDL  Hallucination None reported or observed  Judgment Impaired  Confusion None  Danger to Self  Current suicidal ideation? Denies  Self-Injurious Behavior No self-injurious ideation or behavior indicators observed or expressed   Agreement Not to Harm Self Yes  Description of Agreement verbal  Danger to Others  Danger to Others None reported or observed

## 2023-02-24 NOTE — Progress Notes (Signed)
   02/24/23 0557  15 Minute Checks  Location Bedroom  Visual Appearance Calm  Behavior Sleeping  Sleep (Behavioral Health Patients Only)  Calculate sleep? (Click Yes once per 24 hr at 0600 safety check) Yes  Documented sleep last 24 hours 4.75

## 2023-02-25 DIAGNOSIS — F251 Schizoaffective disorder, depressive type: Secondary | ICD-10-CM | POA: Diagnosis not present

## 2023-02-25 MED ORDER — GABAPENTIN 400 MG PO CAPS: 800.0000 mg | ORAL_CAPSULE | Freq: Three times a day (TID) | ORAL | 0 refills | Status: DC

## 2023-02-25 MED ORDER — RISPERIDONE 1 MG PO TBDP: 1.0000 mg | ORAL_TABLET | Freq: Two times a day (BID) | ORAL | 0 refills | Status: DC

## 2023-02-25 MED ORDER — NICOTINE POLACRILEX 2 MG MT GUM: 2.0000 mg | CHEWING_GUM | OROMUCOSAL | 0 refills | Status: DC | PRN

## 2023-02-25 MED ORDER — TRAZODONE HCL 50 MG PO TABS: 50.0000 mg | ORAL_TABLET | Freq: Every evening | ORAL | 0 refills | Status: DC | PRN

## 2023-02-25 MED ORDER — VALBENAZINE TOSYLATE 40 MG PO CAPS: 40.0000 mg | ORAL_CAPSULE | Freq: Every day | ORAL | 0 refills | Status: DC

## 2023-02-25 NOTE — Group Note (Signed)
Recreation Therapy Group Note   Group Topic:Team Building  Group Date: 02/25/2023 Start Time: 0940 End Time: 1012 Facilitators: Dajah Fischman-McCall, LRT,CTRS Location: [address] Dayroom   Goal Area(s) Addresses:  Patient will effectively work with peer towards shared goal.  Patient will identify skills used to make activity successful.  Patient will identify how skills used during activity can be applied to reach post d/c goals.    Group Description: [employer]. In teams of 5-6, patients were given 11 craft pipe cleaners. Using the materials provided, patients were instructed to compete again the opposing team(s) to build the tallest free-standing structure from floor level. The activity was timed; difficulty increased by Clinical research associate as Production designer, theatre/television/film continued.  Systematically resources were removed with additional directions for example, placing one arm behind their back, working in silence, and shape stipulations. LRT facilitated post-activity discussion reviewing team processes and necessary communication skills involved in completion. Patients were encouraged to reflect how the skills utilized, or not utilized, in this activity can be incorporated to positively impact support systems post discharge.   Affect/Mood: Flat   Participation Level: None   Participation Quality: None   Behavior: Appropriate   Speech/Thought Process: N/A   Insight: N/A   Judgement: N/A   Modes of Intervention: STEM Activity   Patient Response to Interventions:  Attentive   Education Outcome:  Acknowledges education   Clinical Observations/Individualized Feedback: Pt attended and observed for a while before leaving and not returning.      Plan: Continue to engage patient in RT group sessions 2-3x/week.   Aloysuis Ribaudo-McCall, LRT,CTRS 02/25/2023 12:15 PM

## 2023-02-25 NOTE — Progress Notes (Signed)
[facility] LCSW Progress Note   02/25/2023    10:02 AM  [name]   [id number]   Type of Contact and Topic:  Discharge Planning  Patient states that he wants to go to [facility] and that uncle will pick him up.  Pt called uncle and had me speak to him.  Uncle confirmed that he would pick patient up at 12:30pm. CSW notified provider, Tyler Aas, and reviewed case with Medical Director, Phineas Inches. They confirmed that patient can be sent to [facility].    Signed:  Anson Oregon MSW, LCSW, LCAS 02/25/2023 10:02 AM

## 2023-02-25 NOTE — Progress Notes (Signed)
.  transportationfor

## 2023-02-25 NOTE — [facility] Suicide Risk Assessment (Signed)
Suicide Risk Assessment  Discharge Assessment    [facility] Discharge Suicide Risk Assessment   Principal Problem: Schizoaffective disorder [facility]) Discharge Diagnoses: Principal Problem:   Schizoaffective disorder (HCC) Active Problems:   Insomnia   Anxiety state   Tardive dyskinesia  Reason for admission: 42 y.o. AA male presenting voluntarily to [facility] from [facility] ED for SI with a plan to cut his wrists in the context of psychosocial stressors of being homeless for the past 1-2 months, unemployment, behind on paying his child support, and polysubstance abuse. Pt has a past psychiatric history of MDD, GAD, substance induced mood disorder, PTSD, stimulant and opioid use disorder, tobacco use disorder, benzodiazepine abuse, and paranoid schizophrenia. He also has an extensive past hx of inpatient psychiatric hospitalizations.   Hospital Course:  During the patient's hospitalization, patient had extensive initial psychiatric evaluation, and follow-up psychiatric evaluations every day. Psychiatric diagnoses provided upon initial assessment. Patient's psychiatric medications were adjusted on admission as follows: -Continue Suboxone 8-2 mg per SL 1 tablet bid for opioid dependence (home medication) -Continue gabapentin capsule 800 mg tid for neuropathy/GAD/seizure prevention (home medication) -Start risperdal m-tabs disintegrating tablet 1 mg po bid for psychosis -Start ingrezza capsule 40 mg po daily for TD-Complete AIMS per shift.   During the hospitalization, other adjustments were made to the patient's psychiatric medication regimen. On admission, pt was educated that he would not be discharged with a prescription for Suboxone, and verbalized understanding. He stated that this medication is prescribed to him by a Provider at [facility] medical center on [address], and has been educated to go there after discharge for continuity for treatment of Opioid use d/o. Suboxone had been discontinued  prior to discharge. Medications at discharge are as follows: -Continue gabapentin capsule 800 mg tid for neuropathy/GAD/seizure prevention (home medication) -Continue risperdal m-tabs disintegrating tablet 1 mg po bid for psychosis -Continue ingrezza capsule 40 mg po daily for TD -Continue Trazodone 50 mg nightly PRN for sleep -Continue nicorette gum PRN for nicotine addiction  Patient's care was discussed during the interdisciplinary team meeting every day during the hospitalization. The patient denies having side effects to prescribed psychiatric medication. Gradually, patient started adjusting to milieu. The patient was evaluated each day by a clinical provider to ascertain response to treatment. Improvement was noted by the patient's report of decreasing symptoms, improved sleep and appetite, affect, medication tolerance, behavior, and participation in unit programming.  Patient was asked each day to complete a self inventory noting mood, mental status, pain, new symptoms, anxiety and concerns.    Symptoms were reported as significantly decreased or resolved completely by discharge. On day of discharge, the patient reports that their mood is stable. The patient denied having suicidal thoughts for more than 48 hours prior to discharge.  Patient denies having homicidal thoughts. Pt denies any plan or intent to harm self or any one else outside of [facility] health after discharge. Patient denies having auditory hallucinations.  Patient denies any visual hallucinations or other symptoms of psychosis. The patient was motivated to continue taking medication with a goal of continued improvement in mental health.   The patient reports their target psychiatric symptoms of depression, anxiety, sleep responded well to the psychiatric medications, and the patient reports overall benefit from this psychiatric hospitalization. Supportive psychotherapy was provided to the patient. The patient also participated in regular  group therapy while hospitalized. Coping skills, problem solving as well as relaxation therapies were also part of the unit programming.  Labs were reviewed with  the patient, and abnormal results were discussed with the patient.  The patient is able to verbalize their individual safety plan to this provider.  # It is recommended to the patient to continue psychiatric medications as prescribed, after discharge from the hospital.    # It is recommended to the patient to follow up with your outpatient psychiatric provider and PCP.  # It was discussed with the patient, the impact of alcohol, drugs, tobacco have been there overall psychiatric and medical wellbeing, and total abstinence from substance use was recommended the patient.ed.  # Prescriptions provided or sent directly to preferred pharmacy at discharge. Patient agreeable to plan. Given opportunity to ask questions. Appears to feel comfortable with discharge.    # In the event of worsening symptoms, the patient is instructed to call the crisis hotline (988), 911 and or go to the nearest ED for appropriate evaluation and treatment of symptoms. To follow-up with primary care provider for other medical issues, concerns and or health care needs  # Patient was discharged to the [facility] with a plan to follow up as noted below.   Total Time spent with patient: 45 minutes  Musculoskeletal: Strength & Muscle Tone: within normal limits Gait & Station: normal Patient leans: N/A  Psychiatric Specialty Exam  Presentation  General Appearance:  Appropriate for Environment  Eye Contact: Good  Speech: Clear and Coherent  Speech Volume: Normal  Handedness: Right   Mood and Affect  Mood: Euthymic  Duration of Depression Symptoms: Greater than two weeks  Affect: Congruent   Thought Process  Thought Processes: Coherent  Descriptions of Associations:Intact  Orientation:Full (Time, Place and  Person)  Thought Content:Logical  History of Schizophrenia/Schizoaffective disorder:No  Duration of Psychotic Symptoms:No data recorded Hallucinations:Hallucinations: None  Ideas of Reference:None  Suicidal Thoughts:Suicidal Thoughts: No  Homicidal Thoughts:Homicidal Thoughts: No   Sensorium  Memory: Immediate Good  Judgment: Good  Insight: Good   Executive Functions  Concentration: Fair  Attention Span: Fair  Recall: Fair  Fund of Knowledge: Fair  Language: Fair   Psychomotor Activity  Psychomotor Activity:Psychomotor Activity: Normal Extrapyramidal Side Effects (EPS): Tardive Dyskinesia AIMS Completed?: Yes   Assets  Assets: Resilience   Sleep  Sleep:Sleep: Good   Physical Exam: Physical Exam Constitutional:      Appearance: Normal appearance.  Eyes:     Pupils: Pupils are equal, round, and reactive to light.  Musculoskeletal:        General: Normal range of motion.     Cervical back: Normal range of motion.  Neurological:     Mental Status: He is alert and oriented to person, place, and time.    Review of Systems  Constitutional:  Negative for fever.  HENT:  Negative for hearing loss.   Eyes:  Negative for blurred vision.  Respiratory:  Negative for cough.   Cardiovascular:  Negative for chest pain.  Gastrointestinal:  Negative for heartburn.  Genitourinary:  Negative for dysuria.  Musculoskeletal:  Negative for myalgias.  Skin:  Negative for rash.  Neurological:  Negative for dizziness.  Psychiatric/Behavioral:  Positive for depression (Denies SI/HI/AVH, denies any intent or plan to harm self or any one else outside of [facility]) and substance abuse (Educated on the need to abstain from using). Negative for hallucinations, memory loss and suicidal ideas. The patient is nervous/anxious (Resolving on current meds) and has insomnia (Resolving on current meds).    Blood pressure 124/72, pulse 64, temperature 97.8 F (36.6 C),  temperature source Oral,  resp. rate 20, height 5\' 9"  (1.753 m), weight 80.8 kg, SpO2 99 %. Body mass index is 26.3 kg/m.  Mental Status Per Nursing Assessment::   On Admission:  Suicidal ideation indicated by patient  Demographic Factors:  Male  Loss Factors: Financial problems/change in socioeconomic status  Historical Factors: NA  Risk Reduction Factors:   Positive coping skills or problem solving skills  Continued Clinical Symptoms:  Alcohol/Substance Abuse/Dependencies  Cognitive Features That Contribute To Risk:  None    Suicide Risk:  Mild:  There are no identifiable suicide plans, no associated intent, mild dysphoria and related symptoms, good self-control (both objective and subjective assessment), few other risk factors, and identifiable protective factors, including available and accessible social support.    Follow-up Information     [facility] [place] Go on 03/10/2023.   Why: You have an appointment for medication management services on 03/10/23 at 4:00 pm with Dr. Daleen Squibb. Contact information: [address] [place] [address] [phone]         [facility] Follow up.   Why: You may call this provider to personally schedule an appointment for therapy services, if you wish to receive. Contact information: [address] [place], [place] [address] [phone]         [facility] Schedule an appointment as soon as possible for a visit.   Why: Please go to or call this provider to schedule an appointment for the Substance abuse intensive outpatient therapy program (SAIOP). Contact information: [address] [place] [address] [phone]                Starleen Blue, NP 02/25/2023, 9:44 AM

## 2023-02-25 NOTE — Progress Notes (Signed)
   02/25/23 1057  Psych Admission Type (Psych Patients Only)  Admission Status Involuntary  Psychosocial Assessment  Patient Complaints None  Eye Contact Fair  Facial Expression Flat  Affect Appropriate to circumstance  Speech Logical/coherent  Interaction Assertive  Motor Activity Slow  Appearance/Hygiene Unremarkable  Behavior Characteristics Cooperative;Appropriate to situation  Mood Anxious;Depressed  Thought Process  Coherency WDL  Content WDL  Delusions None reported or observed  Perception WDL  Hallucination None reported or observed  Judgment Impaired  Confusion None  Danger to Self  Current suicidal ideation? Denies  Self-Injurious Behavior No self-injurious ideation or behavior indicators observed or expressed   Agreement Not to Harm Self Yes  Description of Agreement Pt verbally contracts for safety  Danger to Others  Danger to Others None reported or observed  Danger to Others Abnormal  Harmful Behavior to others No threats or harm toward other people  Destructive Behavior No threats or harm toward property
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
## 2023-02-25 NOTE — Progress Notes (Signed)
Patient discharged from [facility] on 02/25/23 at 1330. Patient denies SI, plan, and intention. Suicide safety plan completed, reviewed with this RN, given to the patient, and a copy in the chart. Patient denies HI/AVH upon discharge. Patient is alert, oriented, and cooperative. RN provided patient with discharge paperwork and reviewed information with patient. Patient expressed that he understood all of the discharge instructions. Pt was satisfied with belongings returned to him from the locker and at bedside. Discharged patient to [facility] waiting room.

## 2023-02-25 NOTE — Progress Notes (Signed)
   02/25/23 0515  15 Minute Checks  Location Bedroom  Visual Appearance Calm  Behavior Sleeping  Sleep (Behavioral Health Patients Only)  Calculate sleep? (Click Yes once per 24 hr at 0600 safety check) Yes  Documented sleep last 24 hours 6.5

## 2023-02-25 NOTE — Progress Notes (Signed)
02/25/2023  Cole [name] DOB: 1980/10/12 MRN: [id number]   RIDER WAIVER AND RELEASE OF LIABILITY  For the purposes of helping with transportation needs, [facility] partners with outside transportation providers (taxi companies, [employer], Catering manager.) to give [employer] patients or other approved people the choice of on-demand rides [employer]") to our buildings for non-emergency visits.  By using [employer], I, the person signing this document, on behalf of myself and/or any legal minors (in my care using the [employer]), agree:  Science writer given to me are supplied by independent, outside transportation providers who do not work for, or have any affiliation with, [employer]. [facility] is not a transportation company. [facility] has no control over the quality or safety of the rides I get using [employer]. [facility] has no control over whether any outside ride will happen on time or not. [facility] gives no guarantee on the reliability, quality, safety, or availability on any rides, or that no mistakes will happen. I know and accept that traveling by vehicle (car, truck, [employer], [employer] Niece, bus, taxi, etc.) has risks of serious injuries such as disability, being paralyzed, and death. I know and agree the risk of using [employer] is mine alone, and not [employer]. Transport Services are provided "as is" and as are available. The transportation providers are in charge for all inspections and care of the vehicles used to provide these rides. I agree not to take legal action against [facility], its agents, employees, officers, directors, representatives, insurers, attorneys, assigns, successors, subsidiaries, and affiliates at any time for any reasons related directly or indirectly to using [employer]. I also agree not to take legal action against [facility] or its affiliates for any injury, death, or damage to property caused by or related to using  [employer]. I have read this Waiver and Release of Liability, and I understand the terms used in it and their legal meaning. This Waiver is freely and voluntarily given with the understanding that my right (or any legal minors) to legal action against  relating to [employer] is knowingly given up to use these services.   I attest that I read the Ride Waiver and Release of Liability to Cole [name], gave Mr. Larew the opportunity to ask questions and answered the questions asked (if any). I affirm that Cole [name] then provided consent for assistance with transportation.

## 2023-03-13 ENCOUNTER — Other Ambulatory Visit: Payer: Self-pay

## 2023-03-13 ENCOUNTER — Encounter (HOSPITAL_COMMUNITY): Payer: Self-pay | Admitting: Emergency Medicine

## 2023-03-13 ENCOUNTER — Emergency Department (HOSPITAL_COMMUNITY)
Admission: EM | Admit: 2023-03-13 | Discharge: 2023-03-14 | Disposition: A | Discharge status: Home / Self Care | Payer: Medicaid Other | Source: Home / Self Care | Attending: Emergency Medicine | Admitting: Emergency Medicine

## 2023-03-13 DIAGNOSIS — Z59819 Housing instability, housed unspecified: Secondary | ICD-10-CM | POA: Diagnosis not present

## 2023-03-13 DIAGNOSIS — R5383 Other fatigue: Secondary | ICD-10-CM | POA: Insufficient documentation

## 2023-03-13 DIAGNOSIS — J45909 Unspecified asthma, uncomplicated: Secondary | ICD-10-CM | POA: Diagnosis not present

## 2023-03-13 DIAGNOSIS — F17210 Nicotine dependence, cigarettes, uncomplicated: Secondary | ICD-10-CM | POA: Diagnosis not present

## 2023-03-13 DIAGNOSIS — Z1152 Encounter for screening for COVID-19: Secondary | ICD-10-CM | POA: Diagnosis not present

## 2023-03-13 LAB — RESP PANEL BY RT-PCR (RSV, FLU A&B, COVID)  RVPGX2
Influenza A by PCR: NEGATIVE
Influenza B by PCR: NEGATIVE
Resp Syncytial Virus by PCR: NEGATIVE
SARS Coronavirus 2 by RT PCR: NEGATIVE

## 2023-03-13 NOTE — ED Triage Notes (Signed)
Pt via POV c/o 1 week of general malaise and inability to taste or enjoy food. No fevers, chills, headaches, n/v/d. No known exposure to sick contacts.

## 2023-03-14 NOTE — ED Notes (Signed)
Explained to patient that everything has been done for him that can be done. Patient stated "I'm feeling suicidal" after having previously (during previous shift) saying that he would endorse SI in order to stay.
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
## 2023-03-14 NOTE — Discharge Instructions (Addendum)
Patient has been seen in our emergency department 16 times in the past 4 months. He denied suicidal ideation on arrival with our screening questionnaire with our triage nurses. He denied suicidal ideation when seen by our initial night emergency department provider. Due to his current concerns for homelessness our night physician let him sleep in the department and eat. During these ten hours he rested comfortably and did not endorse any suicidal ideation. After a ten hour stay in the emergency department, when given his discharge instructions, the patient started to threaten suicidal ideation. This is blatant malingering, will not consult TTS.

## 2023-03-14 NOTE — ED Notes (Signed)
Entered room and informed patient that he was up for d/c. He stated "I'm not trying to be discharged. I'm trying to talk to a psychiatrist."

## 2023-03-14 NOTE — ED Notes (Signed)
Gave pt bagged meal with drink.

## 2023-04-20 ENCOUNTER — Encounter (HOSPITAL_COMMUNITY): Payer: Self-pay

## 2023-04-20 ENCOUNTER — Emergency Department (HOSPITAL_COMMUNITY)
Admission: EM | Admit: 2023-04-20 | Discharge: 2023-04-20 | Disposition: A | Discharge status: Home / Self Care | Payer: Medicaid Other | Attending: Emergency Medicine | Admitting: Emergency Medicine

## 2023-04-20 DIAGNOSIS — Z5900 Homelessness unspecified: Secondary | ICD-10-CM | POA: Insufficient documentation

## 2023-04-20 DIAGNOSIS — S161XXA Strain of muscle, fascia and tendon at neck level, initial encounter: Secondary | ICD-10-CM | POA: Insufficient documentation

## 2023-04-20 DIAGNOSIS — X58XXXA Exposure to other specified factors, initial encounter: Secondary | ICD-10-CM | POA: Insufficient documentation

## 2023-04-20 DIAGNOSIS — S199XXA Unspecified injury of neck, initial encounter: Secondary | ICD-10-CM | POA: Diagnosis present

## 2023-04-20 NOTE — ED Provider Notes (Signed)
[place] EMERGENCY DEPARTMENT AT [facility] Provider Note   CSN: [id number] Arrival date & time: 04/20/23  1523     History  Chief Complaint  Patient presents with   Neck Pain    Cole [name] is a 42 y.o. male.   Neck Pain Associated symptoms: no numbness and no weakness      42 year old male, history of homelessness, states that he slept on the floor of a hotel room last night, this morning he had pain in his bilateral neck with movement of his head from side-to-side, no numbness or weakness or pain going down his arms.  States that the only thing he wants is a collar for his neck to hold his head up  Home Medications Prior to Admission medications   Medication Sig Start Date End Date Taking? Authorizing Provider  gabapentin (NEURONTIN) 400 MG capsule Take 2 capsules (800 mg total) by mouth 3 (three) times daily. 02/25/23   Starleen Blue, NP  nicotine polacrilex (NICORETTE) 2 MG gum Take 1 each (2 mg total) by mouth as needed for smoking cessation. 02/25/23   Starleen Blue, NP  risperiDONE (RISPERDAL M-TABS) 1 MG disintegrating tablet Take 1 tablet (1 mg total) by mouth 2 (two) times daily. 02/25/23   Starleen Blue, NP  traZODone (DESYREL) 50 MG tablet Take 1 tablet (50 mg total) by mouth at bedtime as needed for sleep. 02/25/23   Starleen Blue, NP  valbenazine (INGREZZA) 40 MG capsule Take 1 capsule (40 mg total) by mouth daily. 02/26/23   Starleen Blue, NP      Allergies    Aleve [naproxen sodium], Aspirin, Ibuprofen, Seroquel [quetiapine fumerate], Tramadol, and Tylenol [acetaminophen]    Review of Systems   Review of Systems  Musculoskeletal:  Positive for neck pain.  Neurological:  Negative for weakness and numbness.    Physical Exam Updated Vital Signs BP 124/81 (BP Location: Right Arm)   Pulse (!) 112   Temp 98.6 F (37 C) (Oral)   Resp 16   Ht 1.753 m (5\' 9" )   Wt 83.9 kg   SpO2 98%   BMI 27.32 kg/m  Physical Exam Vitals and nursing  note reviewed.  Constitutional:      Appearance: He is well-developed. He is not diaphoretic.  HENT:     Head: Normocephalic and atraumatic.  Eyes:     General:        Right eye: No discharge.        Left eye: No discharge.     Conjunctiva/sclera: Conjunctivae normal.  Pulmonary:     Effort: Pulmonary effort is normal. No respiratory distress.  Musculoskeletal:     Comments: Mild tenderness to palpation over the bilateral trapezius and lateral strap muscles of the neck  Skin:    General: Skin is warm and dry.     Findings: No erythema or rash.  Neurological:     Mental Status: He is alert.     Coordination: Coordination normal.     Comments: Totally normal strength sensation and reflexes of the bilateral upper extremities     ED Results / Procedures / Treatments   Labs (all labs ordered are listed, but only abnormal results are displayed) Labs Reviewed - No data to display  EKG None  Radiology No results found.  Procedures Procedures    Medications Ordered in ED Medications - No data to display  ED Course/ Medical Decision Making/ A&P  Medical Decision Making  Unremarkable appearance, vital signs unremarkable, not tachycardic for me, exam more consistent with cervical strain, I have had a discussion with the patient regarding how there would be a lack of benefit of his soft collar for his neck however he states he will help him to go to work, to keep him contributing to society we will try to fulfill his need for a soft collar however I do not think he needs any imaging or significant medical interventions.  He is stable for discharge        Final Clinical Impression(s) / ED Diagnoses Final diagnoses:  Neck strain, initial encounter    Rx / DC Orders ED Discharge Orders     None         Eber Hong, MD 04/20/23 1626

## 2023-04-20 NOTE — ED Triage Notes (Signed)
Pt c/o sharp neck pains and stiffness that started this morning, denies injury

## 2023-05-09 ENCOUNTER — Other Ambulatory Visit (HOSPITAL_COMMUNITY): Payer: Self-pay | Admitting: Adult Health

## 2023-05-09 ENCOUNTER — Ambulatory Visit (HOSPITAL_COMMUNITY)
Admission: RE | Admit: 2023-05-09 | Discharge: 2023-05-09 | Disposition: A | Discharge status: Home / Self Care | Payer: Medicaid Other | Source: Ambulatory Visit | Attending: Adult Health | Admitting: Adult Health

## 2023-05-09 DIAGNOSIS — M542 Cervicalgia: Secondary | ICD-10-CM | POA: Insufficient documentation

## 2023-05-17 ENCOUNTER — Emergency Department (HOSPITAL_COMMUNITY)
Admission: EM | Admit: 2023-05-17 | Discharge: 2023-05-17 | Disposition: A | Discharge status: Home / Self Care | Payer: Medicaid Other | Attending: Emergency Medicine | Admitting: Emergency Medicine

## 2023-05-17 ENCOUNTER — Encounter (HOSPITAL_COMMUNITY): Payer: Self-pay | Admitting: Emergency Medicine

## 2023-05-17 ENCOUNTER — Other Ambulatory Visit: Payer: Self-pay

## 2023-05-17 DIAGNOSIS — F29 Unspecified psychosis not due to a substance or known physiological condition: Secondary | ICD-10-CM | POA: Insufficient documentation

## 2023-05-17 DIAGNOSIS — R4182 Altered mental status, unspecified: Secondary | ICD-10-CM | POA: Insufficient documentation

## 2023-05-17 DIAGNOSIS — R45851 Suicidal ideations: Secondary | ICD-10-CM | POA: Insufficient documentation

## 2023-05-17 LAB — COMPREHENSIVE METABOLIC PANEL
ALT: 19 U/L (ref 0–44)
AST: 18 U/L (ref 15–41)
Albumin: 4 g/dL (ref 3.5–5.0)
Alkaline Phosphatase: 45 U/L (ref 38–126)
Anion gap: 9 (ref 5–15)
BUN: 12 mg/dL (ref 6–20)
CO2: 28 mmol/L (ref 22–32)
Calcium: 8.5 mg/dL — ABNORMAL LOW (ref 8.9–10.3)
Chloride: 99 mmol/L (ref 98–111)
Creatinine, Ser: 0.83 mg/dL (ref 0.61–1.24)
GFR, Estimated: >60 mL/min (ref >60)
Glucose, Bld: 90 mg/dL (ref 70–99)
Potassium: 3.1 mmol/L — ABNORMAL LOW (ref 3.5–5.1)
Sodium: 136 mmol/L (ref 135–145)
Total Bilirubin: 1.1 mg/dL (ref 0.3–1.2)
Total Protein: 7.3 g/dL (ref 6.5–8.1)

## 2023-05-17 LAB — RAPID URINE DRUG SCREEN, HOSP PERFORMED
Amphetamines: NOT DETECTED
Barbiturates: NOT DETECTED
Benzodiazepines: NOT DETECTED
Cocaine: POSITIVE — AB
Opiates: NOT DETECTED
Tetrahydrocannabinol: POSITIVE — AB

## 2023-05-17 LAB — CBC
HCT: 39.2 % (ref 39.0–52.0)
Hemoglobin: 12.6 g/dL — ABNORMAL LOW (ref 13.0–17.0)
MCH: 25.5 pg — ABNORMAL LOW (ref 26.0–34.0)
MCHC: 32.1 g/dL (ref 30.0–36.0)
MCV: 79.2 fL — ABNORMAL LOW (ref 80.0–100.0)
Platelets: 156 10*3/uL (ref 150–400)
RBC: 4.95 MIL/uL (ref 4.22–5.81)
RDW: 14.1 % (ref 11.5–15.5)
WBC: 6.3 10*3/uL (ref 4.0–10.5)
nRBC: 0 % (ref 0.0–0.2)

## 2023-05-17 LAB — SALICYLATE LEVEL: Salicylate Lvl: <7 mg/dL — ABNORMAL LOW (ref 7.0–30.0)

## 2023-05-17 LAB — ETHANOL: Alcohol, Ethyl (B): <10 mg/dL (ref <10)

## 2023-05-17 LAB — ACETAMINOPHEN LEVEL: Acetaminophen (Tylenol), Serum: <10 ug/mL — ABNORMAL LOW (ref 10–30)

## 2023-05-17 NOTE — ED Notes (Signed)
Pt is back in bed and has been seen by TTS.  [place] [employer] at bedside servicing IVC paperwork.

## 2023-05-17 NOTE — ED Notes (Signed)
Pt continues to be upset and sts "I'll leave if I want."  Pt reeducated on IVC process.  [place] PD remains at bedside.

## 2023-05-17 NOTE — ED Notes (Signed)
Faxed rescinded paperwork to [facility] @ [phone].

## 2023-05-17 NOTE — ED Notes (Signed)
Faxed paperwork to [facility] @ [phone] and [phone].

## 2023-05-17 NOTE — ED Notes (Signed)
Pt changed into burgundy scrubs and gray socks.  Belongings bag x2 placed in [place] #2 and locker labelled.  Security called to wand Pt.

## 2023-05-17 NOTE — ED Notes (Signed)
Pt states, "y'all ain't done shit since I been here. I'm leaving" and walked out of the ED. Dr. Estell Harpin aware.

## 2023-05-17 NOTE — ED Notes (Signed)
Faxed paperwork to [facility] office.

## 2023-05-17 NOTE — ED Notes (Signed)
Belongings returned and Pt given sodas, graham crackers, and peanut butter.  Pt verbalized understanding of d/c papers.

## 2023-05-17 NOTE — Discharge Instructions (Signed)
[facility] helps individuals living with mental health challenges Assertive Community Treatment (ACT) is a full spectrum, long-term, community-based treatment model for adults with severe and persistent mental health needs that have not been adequately treated by more traditional treatment services. This evidence-based treatment approach involves a high level of coordinated care and supports individuals as they manage their mental health challenges and live independently. [facility] uses a person-centered approach. Our [facility] team members work with individuals with mental health challenges to address every aspect of their lives, whether through medication, therapy, social support, employment or housing. We provide services in various locations including home, schools and other community settings. We are able to meet clients at their point of need and desired place of interaction. All mental health care is handled by our team of professional staff in various locations across [place]. Support is offered to gain improvements in any aspect of one's life. Care is coordinated before, during and after any hospitalization. Adults with a history of mental health needs related to a psychotic disorder, major depression with psychosis and bipolar disorder, regardless of history of substance use. Individuals with a history of repeated hospitalizations. Individuals who need highly-coordinated care. Individuals who might be reluctant to engage in treatment. Individuals who would benefit from support in obtaining basic needs such as housing, education, employment, accessing benefits and support services.    The [employer] provides direct support to adults who need extensive treatment and are living with a diagnosis of mental illness, substance use or co-morbid disorder. Support consists of community-based mental health and substance use  services, along with structured rehabilitative interventions. We help individuals live successfully in the community. The [employer] team approach involves structured, face-to-face therapeutic interventions that assist in helping individuals with their many facets of their life: emotional, behavioral, social, safety, housing, medical and health, educational, vocational and legal.  Our Community Support Team is designed to provide:  First responder intervention to deescalate a crisis. Restorative interventions for development of interpersonal, community, coping and independent living skills. Service coordination and ensure linkage to community services and resources. Symptom stability by reducing presenting psychiatric or substance use disorder symptoms. Intervention is available 24 hours a day, 7 days a week, 365 days-a-year and delivered by our [employer], who maintain contact and intervene as one organizational unit. Services are provided through a team approach and discrete interventions may be delivered by any one or more team members if clinically indicated.  Community Support Teams are made up of four [employer]	[place] team members that include a Licensed Team Lead, Licensed Co-Occurring Disorder Specialist, Herbalist and a Secondary school teacher. This team works together to help individuals achieve their personal goals, increase and maintain independence and develop social ties and supports within their community. Below are the eligibility requirements for participating in our [employer] program.  Adults age 40-years of age and older The beneficiary requires active rehabilitation and support services to achieve the restoration of functioning and community integration and valued life roles in social, employment, daily living, personal wellness, educational or housing domains. Deterioration in functioning in the absence of community-based services and  supports would lead to hospitalization, other long-term treatment setting or congregate care, such as adult care or assisted living. The beneficiary's own resources and support systems are not adequate to provide the level of support needed to live safely in the community. One or more admissions in an acute psychiatric hospital or use of crisis  or emergency services per calendar year, or a hospital stay more than 30-calendar days within the past calendar year. Pending discharge (less than 30-calendar days) from an adult care home, acute psychiatric hospital, emergency department or other crisis setting. Traditional behavioral health services alone, are not clinically appropriate to prevent the beneficiary's condition from deteriorating (such as missing office appointments, difficulty maintaining medication schedules). Legal issues related to the beneficiary's mental or substance use disorder diagnosis. Homeless or at high risk of homelessness due to residential instability resulting from the beneficiary's mental health or substance use disorder diagnosis or has difficulty sustaining a safe stable living environment. Clinical evidence of suicidal gestures, persistent ideation, or both in past three months.

## 2023-05-17 NOTE — ED Notes (Signed)
Faxed rescinded paperwork to [facility]

## 2023-05-17 NOTE — BH Assessment (Addendum)
Comprehensive Clinical Assessment (CCA) Screening, Triage and Referral Note  05/17/2023 Cole [name] [id number]  Per Cole Gambles, NP, patient is psychiatrically cleared.   The patient demonstrates the following risk factors for suicide: Chronic risk factors for suicide include: psychiatric disorder of depression  and substance use disorder. Acute risk factors for suicide include: unemployment and loss (financial, interpersonal, professional). Protective factors for this patient include: positive social support, positive therapeutic relationship, responsibility to others (children, family), and hope for the future. Considering these factors, the overall suicide risk at this point appears to be low. Patient is appropriate for outpatient follow up.  Chief Complaint:  Chief Complaint  Patient presents with   Suicidal   Visit Diagnosis: Suicidal Ideations  Patient Reported Information How did you hear about [place]? Self  What Is the Reason for Your Visit/Call Today? Cole [name] is a 42 year old male presenting to [facility] voluntarily with chief complaint of suicidal ideations. Patient reports that he came to the ED "because it was raining outside, and I did not have nowhere to go. I'm homeless". Patient states that he needed somewhere to stay due to it raining and now he is ready to leave. Patient denies SI, HI, AVH and paranoia. Patient reports that he lives with his mother "on and off" and reports that his mother was on her way to pick him up from the ED. Patient provides TTS with his mother's name and number Cole [name] [phone]).     Patient reports that he goes to [facility] for medication management and reports diagnosis of depression, anxiety, bipolar and schizophrenia. Patient reports he needs help with getting housing, employment and with his substance use. Patient UDS is positive for cocaine and THC.     Patient is oriented to person, place and situation. Patient eye contact is  avoidant, speech is clear, he appears agitated but engages in assessment. Patient denies SI, HI, AVH and contracts for safety. Patient denies prior suicide attempts and reports he is ready to leave the hospital. TTS spoke to patient mother who reports that she is on the way to the ED to pick patient up and she denies having any safety concerns. TTS spoke to patient and mother about enhanced services and how it could assist him with his biopsychosocial stressors. It appears that patient attempted to elope from the ED prior to TTS assessment therefore patient was IVC'd. Patient has a documented history of malingering. Patient was seen at [facility] on [facility]/14/[facility] and per EDP "Cole [name] is a 42 year-old male with no pertinent past medical history presenting to the ED with chief complaint of fatigue. In triage patient complaining of fatigue and lack of taste.  With me patient explains that he is homeless, and he wants help.  Wants to get access to a homeless shelter.  Says that if he needs to endorse suicidality to get what he needs then he will do it." Per patient report he presented to the ED to evade the elements and is now ready to leave.   How Long Has This Been Causing You Problems? 1 wk - 1 month  What Do You Feel Would Help You the Most Today? Treatment for Depression or other mood problem; Housing Assistance; Alcohol or Drug Use Treatment   Have You Recently Had Any Thoughts About Hurting Yourself? Yes  Are You Planning to Commit Suicide/Harm Yourself At This time? No   Have you Recently Had Thoughts About Hurting Someone Cole [name]? No  Are You Planning to Harm  Someone at This Time? No  Explanation: NA   Have You Used Any Alcohol or Drugs in the Past 24 Hours? No  How Long Ago Did You Use Drugs or Alcohol? na What Did You Use and How Much? NA   Do You Currently Have a Therapist/Psychiatrist? Yes  Name of Therapist/Psychiatrist: [facility] IN [place]   Have You Been Recently Discharged From  Any Office Practice or Programs? No  Explanation of Discharge From Practice/Program: NA    CCA Screening Triage Referral Assessment Type of Contact: Tele-Assessment  Telemedicine Service Delivery:   Is this Initial or Reassessment?   Date Telepsych consult ordered in [facility]:    Time Telepsych consult ordered in [facility]:    Location of Assessment: [facility]  Provider Location: [facility] Assessment Services    Collateral Involvement: [facility] PATIENT Cole [name] [phone]   Does Patient Have a Court Appointed Legal Guardian? NA Name and Contact of Legal Guardian: NA If Minor and Not Living with Parent(s), Who has Custody? NA  Is CPS involved or ever been involved? Never  Is APS involved or ever been involved? Never   Patient Determined To Be At Risk for Harm To Self or Others Based on Review of Patient Reported Information or Presenting Complaint? No  Method: No Plan  Availability of Means: No access or NA  Intent: Vague intent or NA  Notification Required: No need or identified person  Additional Information for Danger to Others Potential: -- (NA)  Additional Comments for Danger to Others Potential: NA  Are There Guns or Other Weapons in Your Home? No  Types of Guns/Weapons: NA  Are These Weapons Safely Secured?                            -- (NA)  Who Could Verify You Are Able To Have These Secured: NA  Do You Have any Outstanding Charges, Pending Court Dates, Parole/Probation? DENIES  Contacted To Inform of Risk of Harm To Self or Others: Family/Significant Other:   Does Patient Present under Involuntary Commitment? Yes    [place] of Residence: Other (Comment) ([place])   Patient Currently Receiving the Following Services: Medication Management   Determination of Need: Urgent (48 hours)   Options For Referral: Other: Comment (CST OR ACTT SERVICES)   Discharge Disposition:     Cole [name], [facility]

## 2023-05-17 NOTE — ED Notes (Signed)
Pt wanded by [employer].

## 2023-05-17 NOTE — ED Notes (Signed)
Per EDP, Pt is being IVCd.  Pt is currently standing in from of the ED trying to call/asking for a ride.  Pt's belongings remain in [place] #2.  IVC paperwork has been faxed.

## 2023-05-17 NOTE — ED Provider Notes (Signed)
[place] EMERGENCY DEPARTMENT AT [facility] Provider Note   CSN: [id number] Arrival date & time: 05/17/23  1308     History {Add pertinent medical, surgical, social history, OB history to HPI:1} Chief Complaint  Patient presents with   Suicidal    Cole [name] is a 42 y.o. male.  Patient states he wants to kill himself.  He has not attempted to hurt himself   Altered Mental Status Presenting symptoms: behavior changes   Presenting symptoms: no disorientation   Severity:  Severe Most recent episode:  Today Episode history:  Continuous Timing:  Constant Progression:  Waxing and waning Chronicity:  New Context: not alcohol use   Associated symptoms: no abdominal pain, no hallucinations, no headaches, no rash and no seizures        Home Medications Prior to Admission medications   Medication Sig Start Date End Date Taking? Authorizing Provider  gabapentin (NEURONTIN) 400 MG capsule Take 2 capsules (800 mg total) by mouth 3 (three) times daily. 02/25/23   Starleen Blue, NP  nicotine polacrilex (NICORETTE) 2 MG gum Take 1 each (2 mg total) by mouth as needed for smoking cessation. 02/25/23   Starleen Blue, NP  risperiDONE (RISPERDAL M-TABS) 1 MG disintegrating tablet Take 1 tablet (1 mg total) by mouth 2 (two) times daily. 02/25/23   Starleen Blue, NP  traZODone (DESYREL) 50 MG tablet Take 1 tablet (50 mg total) by mouth at bedtime as needed for sleep. 02/25/23   Starleen Blue, NP  valbenazine (INGREZZA) 40 MG capsule Take 1 capsule (40 mg total) by mouth daily. 02/26/23   Starleen Blue, NP      Allergies    Aleve [naproxen sodium], Aspirin, Ibuprofen, Seroquel [quetiapine fumerate], Tramadol, and Tylenol [acetaminophen]    Review of Systems   Review of Systems  Constitutional:  Negative for appetite change and fatigue.  HENT:  Negative for congestion, ear discharge and sinus pressure.   Eyes:  Negative for discharge.  Respiratory:  Negative for cough.    Cardiovascular:  Negative for chest pain.  Gastrointestinal:  Negative for abdominal pain and diarrhea.  Genitourinary:  Negative for frequency and hematuria.  Musculoskeletal:  Negative for back pain.  Skin:  Negative for rash.  Neurological:  Negative for seizures and headaches.  Psychiatric/Behavioral:  Positive for dysphoric mood and suicidal ideas. Negative for hallucinations.     Physical Exam Updated Vital Signs BP 131/87 (BP Location: Left Arm)   Pulse 68   Temp 97.9 F (36.6 C) (Oral)   Resp 18   Ht 5\' 9"  (1.753 m)   Wt 83.9 kg   SpO2 100%   BMI 27.31 kg/m  Physical Exam Vitals and nursing note reviewed.  Constitutional:      Appearance: He is well-developed.  HENT:     Head: Normocephalic.     Nose: Nose normal.  Eyes:     General: No scleral icterus.    Conjunctiva/sclera: Conjunctivae normal.  Neck:     Thyroid: No thyromegaly.  Cardiovascular:     Rate and Rhythm: Normal rate and regular rhythm.     Heart sounds: No murmur heard.    No friction rub. No gallop.  Pulmonary:     Breath sounds: No stridor. No wheezing or rales.  Chest:     Chest wall: No tenderness.  Abdominal:     General: There is no distension.     Tenderness: There is no abdominal tenderness. There is no rebound.  Musculoskeletal:  General: Normal range of motion.     Cervical back: Neck supple.  Lymphadenopathy:     Cervical: No cervical adenopathy.  Skin:    Findings: No erythema or rash.  Neurological:     Mental Status: He is alert and oriented to person, place, and time.     Motor: No abnormal muscle tone.     Coordination: Coordination normal.  Psychiatric:     Comments: Suicidal     ED Results / Procedures / Treatments   Labs (all labs ordered are listed, but only abnormal results are displayed) Labs Reviewed  COMPREHENSIVE METABOLIC PANEL - Abnormal; Notable for the following components:      Result Value   Potassium 3.1 (*)    Calcium 8.5 (*)    All  other components within normal limits  SALICYLATE LEVEL - Abnormal; Notable for the following components:   Salicylate Lvl <7.0 (*)    All other components within normal limits  ACETAMINOPHEN LEVEL - Abnormal; Notable for the following components:   Acetaminophen (Tylenol), Serum <10 (*)    All other components within normal limits  CBC - Abnormal; Notable for the following components:   Hemoglobin 12.6 (*)    MCV 79.2 (*)    MCH 25.5 (*)    All other components within normal limits  ETHANOL  RAPID URINE DRUG SCREEN, HOSP PERFORMED    EKG None  Radiology No results found.  Procedures Procedures  {Document cardiac monitor, telemetry assessment procedure when appropriate:1}  Medications Ordered in ED Medications - No data to display  ED Course/ Medical Decision Making/ A&P  Patient is medically cleared and awaiting behavioral health consult {CRITICAL CARE Performed by: Bethann Berkshire Total critical care time: 45 minutes Critical care time was exclusive of separately billable procedures and treating other patients. Critical care was necessary to treat or prevent imminent or life-threatening deterioration. Critical care was time spent personally by me on the following activities: development of treatment plan with patient and/or surrogate as well as nursing, discussions with consultants, evaluation of patient's response to treatment, examination of patient, obtaining history from patient or surrogate, ordering and performing treatments and interventions, ordering and review of laboratory studies, ordering and review of radiographic studies, pulse oximetry and re-evaluation of patient's condition.   Patient with suicidal ideations.  Patient was trying to leave the hospital prior to getting a full evaluation by psychiatry.  He was IVC need for suicidal ideation    Patient was seen by behavioral health and it was decided the patient could be discharged home that he can be treated  as an outpatient Click here for ABCD2, HEART and other calculatorsREFRESH Note before signing :1}                              Medical Decision Making Amount and/or Complexity of Data Reviewed Labs: ordered.   Patient with suicidal ideations, behavior health consult pending.   Behavioral health decide patient can be discharged home and treated for depression as an outpatient  {Document critical care time when appropriate:1} {Document review of labs and clinical decision tools ie heart score, Chads2Vasc2 etc:1}  {Document your independent review of radiology images, and any outside records:1} {Document your discussion with family members, caretakers, and with consultants:1} {Document social determinants of health affecting pt's care:1} {Document your decision making why or why not admission, treatments were needed:1} Final Clinical Impression(s) / ED Diagnoses Final diagnoses:  None  Rx / DC Orders ED Discharge Orders     None

## 2023-05-17 NOTE — ED Triage Notes (Signed)
Pt to ed, pt states they are feeling suicidal, homeless, and currently has no where to go. Pt states they have a plan to cut their wrists but does not plan on acting on that plan at the moment. Pt states they currently do not have a weapon on them but has access to one.

## 2023-06-11 ENCOUNTER — Emergency Department (HOSPITAL_COMMUNITY)
Admission: EM | Admit: 2023-06-11 | Discharge: 2023-06-11 | Disposition: A | Discharge status: Home / Self Care | Payer: Medicaid Other | Attending: Student | Admitting: Student

## 2023-06-11 ENCOUNTER — Other Ambulatory Visit: Payer: Self-pay

## 2023-06-11 ENCOUNTER — Encounter (HOSPITAL_COMMUNITY): Payer: Self-pay

## 2023-06-11 DIAGNOSIS — M79674 Pain in right toe(s): Secondary | ICD-10-CM | POA: Insufficient documentation

## 2023-06-11 NOTE — ED Provider Notes (Signed)
[place] EMERGENCY DEPARTMENT AT [facility] Provider Note   CSN: [id number] Arrival date & time: 06/11/23  1406     History  Chief Complaint  Patient presents with   Toe Pain    Cole [name] is a 42 y.o. male.  [name] the ER for right great toe pain for the past week.  He is tenderness to the plantar aspect of the toe and states he has a "bunion" denies fever swelling or drainage.  It is worse when he walks.  States he spends a lot of time on his feet.  He is also asking for snacks.  Denies trauma to his foot.   Toe Pain       Home Medications Prior to Admission medications   Medication Sig Start Date End Date Taking? Authorizing Provider  gabapentin (NEURONTIN) 400 MG capsule Take 2 capsules (800 mg total) by mouth 3 (three) times daily. 02/25/23  Yes Starleen Blue, NP  nicotine polacrilex (NICORETTE) 2 MG gum Take 1 each (2 mg total) by mouth as needed for smoking cessation. 02/25/23   Starleen Blue, NP  risperiDONE (RISPERDAL M-TABS) 1 MG disintegrating tablet Take 1 tablet (1 mg total) by mouth 2 (two) times daily. 02/25/23   Starleen Blue, NP  traZODone (DESYREL) 50 MG tablet Take 1 tablet (50 mg total) by mouth at bedtime as needed for sleep. 02/25/23   Starleen Blue, NP  valbenazine (INGREZZA) 40 MG capsule Take 1 capsule (40 mg total) by mouth daily. 02/26/23   Starleen Blue, NP      Allergies    Aleve [naproxen sodium], Aspirin, Ibuprofen, Seroquel [quetiapine fumerate], Tramadol, and Tylenol [acetaminophen]    Review of Systems   Review of Systems  Physical Exam Updated Vital Signs BP (!) 102/58 (BP Location: Right Arm)   Pulse 65   Temp 97.7 F (36.5 C)   Resp 17   Ht 5\' 9"  (1.753 m)   Wt 83.9 kg   SpO2 98%   BMI 27.32 kg/m  Physical Exam Vitals and nursing note reviewed.  Constitutional:      General: He is not in acute distress.    Appearance: He is well-developed.  HENT:     Head: Normocephalic and atraumatic.  Eyes:      Conjunctiva/sclera: Conjunctivae normal.  Cardiovascular:     Rate and Rhythm: Normal rate and regular rhythm.     Heart sounds: No murmur heard. Pulmonary:     Effort: Pulmonary effort is normal. No respiratory distress.     Breath sounds: Normal breath sounds.  Abdominal:     Palpations: Abdomen is soft.     Tenderness: There is no abdominal tenderness.  Musculoskeletal:        General: No swelling.     Cervical back: Neck supple.     Comments: Patient's right foot has good pulses, warm well-perfused, the plantar aspect of the great toe over the proximal phalanx has what appears to be a plantar wart versus callus buildup.  No redness, no warmth, no drainage, minimal tenderness.  Normal range of motion of the joints of the toe.  No signs of trauma.  Skin:    General: Skin is warm and dry.     Capillary Refill: Capillary refill takes less than 2 seconds.  Neurological:     Mental Status: He is alert.  Psychiatric:        Mood and Affect: Mood normal.     ED Results / Procedures / Treatments  Labs (all labs ordered are listed, but only abnormal results are displayed) Labs Reviewed - No data to display  EKG None  Radiology No results found.  Procedures Procedures    Medications Ordered in ED Medications - No data to display  ED Course/ Medical Decision Making/ A&P                                 Medical Decision Making DDx: Plantar wart, bunion, callus, abscess, cellulitis, contusion, fracture, other  Course: Patient has callus buildup versus plantar wart to plantar aspect of right great toe with minimal tenderness, no trauma or swelling, is able to bear full weight and ambulate.  At this time I do not see any indication for imaging.  Discussed on padding with over-the-counter corn pads and follow-up with podiatry.  Amount and/or Complexity of Data Reviewed External Data Reviewed: notes.   =        Final Clinical Impression(s) / ED Diagnoses Final  diagnoses:  Pain of right great toe    Rx / DC Orders ED Discharge Orders     None         Ma Rings, PA-C 06/11/23 1447    Glendora Score, MD 06/12/23 [phone]

## 2023-06-11 NOTE — Discharge Instructions (Signed)
The spot on your toe is likely either a plantar wart or a large callus this needs to be taken care of by the foot doctor.  You can take ibuprofen as needed for pain.  You can also buy over-the-counter cushions to help decrease the pain and pad the toe

## 2023-06-11 NOTE — ED Triage Notes (Signed)
Pt complains of toe pain x1-2 weeks. Stated he has a bunion   Pt agitated in triage and stated he "needs to talk to that doctor about something" but would not elaborate with this RN

## 2023-06-28 ENCOUNTER — Encounter: Payer: Medicaid Other | Admitting: Orthopedic Surgery

## 2023-07-05 ENCOUNTER — Encounter: Payer: Medicaid Other | Admitting: Orthopedic Surgery

## 2023-07-19 ENCOUNTER — Ambulatory Visit: Payer: Medicaid Other | Admitting: Orthopedic Surgery

## 2023-07-28 ENCOUNTER — Other Ambulatory Visit: Payer: Self-pay

## 2023-07-28 ENCOUNTER — Emergency Department (HOSPITAL_COMMUNITY)
Admission: EM | Admit: 2023-07-28 | Discharge: 2023-07-29 | Disposition: A | Discharge status: Other Acute Inpatient Hospital | Payer: Medicaid Other | Source: Home / Self Care | Attending: Emergency Medicine | Admitting: Emergency Medicine

## 2023-07-28 ENCOUNTER — Encounter (HOSPITAL_COMMUNITY): Payer: Self-pay

## 2023-07-28 DIAGNOSIS — R45851 Suicidal ideations: Secondary | ICD-10-CM | POA: Insufficient documentation

## 2023-07-28 DIAGNOSIS — F3342 Major depressive disorder, recurrent, in full remission: Secondary | ICD-10-CM | POA: Insufficient documentation

## 2023-07-28 DIAGNOSIS — F1420 Cocaine dependence, uncomplicated: Secondary | ICD-10-CM | POA: Insufficient documentation

## 2023-07-28 LAB — CBC WITH DIFFERENTIAL/PLATELET
Abs Immature Granulocytes: 0.01 10*3/uL (ref 0.00–0.07)
Basophils Absolute: 0 10*3/uL (ref 0.0–0.1)
Basophils Relative: 0 %
Eosinophils Absolute: 0.1 10*3/uL (ref 0.0–0.5)
Eosinophils Relative: 2 %
HCT: 39.7 % (ref 39.0–52.0)
Hemoglobin: 12.8 g/dL — ABNORMAL LOW (ref 13.0–17.0)
Immature Granulocytes: 0 %
Lymphocytes Relative: 38 %
Lymphs Abs: 1.8 10*3/uL (ref 0.7–4.0)
MCH: 25.4 pg — ABNORMAL LOW (ref 26.0–34.0)
MCHC: 32.2 g/dL (ref 30.0–36.0)
MCV: 78.9 fL — ABNORMAL LOW (ref 80.0–100.0)
Monocytes Absolute: 0.4 10*3/uL (ref 0.1–1.0)
Monocytes Relative: 8 %
Neutro Abs: 2.5 10*3/uL (ref 1.7–7.7)
Neutrophils Relative %: 52 %
Platelets: 148 10*3/uL — ABNORMAL LOW (ref 150–400)
RBC: 5.03 MIL/uL (ref 4.22–5.81)
RDW: 13.4 % (ref 11.5–15.5)
WBC: 4.9 10*3/uL (ref 4.0–10.5)
nRBC: 0 % (ref 0.0–0.2)

## 2023-07-28 LAB — COMPREHENSIVE METABOLIC PANEL
ALT: 16 U/L (ref 0–44)
AST: 14 U/L — ABNORMAL LOW (ref 15–41)
Albumin: 3.8 g/dL (ref 3.5–5.0)
Alkaline Phosphatase: 38 U/L (ref 38–126)
Anion gap: 5 (ref 5–15)
BUN: 14 mg/dL (ref 6–20)
CO2: 26 mmol/L (ref 22–32)
Calcium: 8.7 mg/dL — ABNORMAL LOW (ref 8.9–10.3)
Chloride: 107 mmol/L (ref 98–111)
Creatinine, Ser: 0.74 mg/dL (ref 0.61–1.24)
GFR, Estimated: >60 mL/min (ref >60)
Glucose, Bld: 91 mg/dL (ref 70–99)
Potassium: 3.5 mmol/L (ref 3.5–5.1)
Sodium: 138 mmol/L (ref 135–145)
Total Bilirubin: 1.1 mg/dL (ref <1.2)
Total Protein: 7 g/dL (ref 6.5–8.1)

## 2023-07-28 LAB — SALICYLATE LEVEL: Salicylate Lvl: <7 mg/dL — ABNORMAL LOW (ref 7.0–30.0)

## 2023-07-28 LAB — ACETAMINOPHEN LEVEL: Acetaminophen (Tylenol), Serum: <10 ug/mL — ABNORMAL LOW (ref 10–30)

## 2023-07-28 LAB — ETHANOL: Alcohol, Ethyl (B): <10 mg/dL (ref <10)

## 2023-07-28 MED ORDER — ALPRAZOLAM 0.5 MG PO TABS
1.0000 mg | ORAL_TABLET | Freq: Three times a day (TID) | ORAL | Status: DC | PRN
  Administered 2023-07-28: 1 mg via ORAL
  Filled 2023-07-28: qty 2

## 2023-07-28 MED ORDER — TRAZODONE HCL 50 MG PO TABS
100.0000 mg | ORAL_TABLET | Freq: Every day | ORAL | Status: DC
  Administered 2023-07-28: 100 mg via ORAL
  Filled 2023-07-28: qty 2

## 2023-07-28 MED ORDER — DIVALPROEX SODIUM 250 MG PO DR TAB
500.0000 mg | DELAYED_RELEASE_TABLET | Freq: Every day | ORAL | Status: DC
  Administered 2023-07-28 – 2023-07-29 (×2): 500 mg via ORAL
  Filled 2023-07-28 (×2): qty 2

## 2023-07-28 MED ORDER — BUPRENORPHINE HCL-NALOXONE HCL 8-2 MG SL SUBL
1.0000 | SUBLINGUAL_TABLET | Freq: Two times a day (BID) | SUBLINGUAL | Status: DC
  Administered 2023-07-28 – 2023-07-29 (×2): 1 via SUBLINGUAL
  Filled 2023-07-28 (×2): qty 1

## 2023-07-28 MED ORDER — GABAPENTIN 400 MG PO CAPS
800.0000 mg | ORAL_CAPSULE | Freq: Three times a day (TID) | ORAL | Status: DC
  Administered 2023-07-28 – 2023-07-29 (×2): 800 mg via ORAL
  Filled 2023-07-28 (×2): qty 2

## 2023-07-28 MED ORDER — NICOTINE POLACRILEX 2 MG MT GUM
2.0000 mg | CHEWING_GUM | OROMUCOSAL | Status: DC | PRN
Start: 2023-07-28 — End: 2023-07-29

## 2023-07-28 MED ORDER — VALBENAZINE TOSYLATE 40 MG PO CAPS
40.0000 mg | ORAL_CAPSULE | Freq: Every day | ORAL | Status: DC
Start: 2023-07-28 — End: 2023-07-29
  Filled 2023-07-28 (×5): qty 1

## 2023-07-28 NOTE — ED Notes (Signed)
Pt changed into [facility] scrubs, wanded by security. Brooke NT as Comptroller. Waiting on EDP evaluation.

## 2023-07-28 NOTE — ED Triage Notes (Signed)
Pt to er, pt states that he is feeling suicidal, pt denies having a plan, states that he has been feeling this way for the last week or two.  Pt denies any suicide attempt or ingestion at this time

## 2023-07-28 NOTE — ED Notes (Signed)
TTS completed. 

## 2023-07-28 NOTE — ED Notes (Signed)
Pharmacy came to bedside to review medications. Pt now sleeping again.
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
## 2023-07-28 NOTE — ED Notes (Signed)
Pt given frozen dinner and drink as requested

## 2023-07-28 NOTE — ED Provider Notes (Signed)
[place] EMERGENCY DEPARTMENT AT [facility] Provider Note   CSN: [id number] Arrival date & time: 07/28/23  1625     History  Chief Complaint  Patient presents with   Suicidal    Cole [name] is a 42 y.o. male with a history including anxiety and depression, prior suicidal ideation last seen here in September with the same complaint, also has history of hepatitis C, chronic back pain, history of polysubstance abuse, he endorses he is currently on Suboxone.  He presents for increasing depression over the last 1 to 2 weeks with suicidal thoughts without a specific plan.  He denies acting on his thoughts, denies any ingestions or attempts.  Has no physical complaints at this time.  The history is provided by the patient.       Home Medications Prior to Admission medications   Medication Sig Start Date End Date Taking? Authorizing Provider  ALPRAZolam Prudy Feeler) 1 MG tablet Take 1 mg by mouth 3 (three) times daily as needed for anxiety or sleep. 07/22/23  Yes [provider]  baclofen (LIORESAL) 10 MG tablet Take 10 mg by mouth 3 (three) times daily as needed. 04/19/23  Yes [provider]  divalproex (DEPAKOTE) 500 MG DR tablet Take 500 mg by mouth daily. 05/26/23  Yes [provider]  gabapentin (NEURONTIN) 800 MG tablet Take 800 mg by mouth 3 (three) times daily. 07/22/23  Yes [provider]  NARCAN 4 MG/0.1ML LIQD nasal spray kit Place 1 spray into the nose once. 04/18/23  Yes [provider]  nicotine polacrilex (NICORETTE) 2 MG gum Take 1 each (2 mg total) by mouth as needed for smoking cessation. 02/25/23  Yes Nkwenti, Doris, NP  SUBOXONE 8-2 MG FILM Place 1 Film under the tongue 2 (two) times daily. 07/20/23  Yes [provider]  traZODone (DESYREL) 100 MG tablet Take 100 mg by mouth at bedtime. 06/16/23  Yes [provider]  valbenazine (INGREZZA) 40 MG capsule Take 1 capsule (40 mg total) by mouth daily.  02/26/23  Yes Starleen Blue, NP  risperiDONE (RISPERDAL M-TABS) 1 MG disintegrating tablet Take 1 tablet (1 mg total) by mouth 2 (two) times daily. Patient not taking: Reported on 07/28/2023 02/25/23   Starleen Blue, NP  VRAYLAR 1.5 MG capsule Take 1.5 mg by mouth daily. Patient not taking: Reported on 07/28/2023 05/27/23   [provider]      Allergies    Aleve [naproxen sodium], Aspirin, Ibuprofen, Seroquel [quetiapine fumerate], Tramadol, and Tylenol [acetaminophen]    Review of Systems   Review of Systems  Constitutional:  Negative for fever.  HENT:  Negative for congestion and sore throat.   Eyes: Negative.   Respiratory:  Negative for chest tightness and shortness of breath.   Cardiovascular:  Negative for chest pain.  Gastrointestinal:  Negative for abdominal pain and nausea.  Genitourinary: Negative.   Musculoskeletal:  Negative for arthralgias, joint swelling and neck pain.  Skin: Negative.  Negative for rash and wound.  Neurological:  Negative for dizziness, weakness, light-headedness, numbness and headaches.  Psychiatric/Behavioral:  Positive for dysphoric mood and suicidal ideas.   All other systems reviewed and are negative.   Physical Exam Updated Vital Signs BP 125/73 (BP Location: Right Arm)   Pulse 65   Temp 98.4 F (36.9 C) (Oral)   Resp 18   Ht 5\' 9"  (1.753 m)   Wt 95.3 kg   SpO2 95%   BMI 31.01 kg/m  Physical Exam Vitals and  nursing note reviewed.  Constitutional:      Appearance: He is well-developed.     Comments: Sleepy, but wakes easy, cooperative, flat affect,  quiet speech  HENT:     Head: Normocephalic and atraumatic.  Eyes:     Conjunctiva/sclera: Conjunctivae normal.  Cardiovascular:     Rate and Rhythm: Normal rate and regular rhythm.     Heart sounds: Normal heart sounds.  Pulmonary:     Effort: Pulmonary effort is normal.     Breath sounds: Normal breath sounds. No wheezing.  Abdominal:     General: Bowel sounds are  normal.     Palpations: Abdomen is soft.     Tenderness: There is no abdominal tenderness.  Musculoskeletal:        General: Normal range of motion.     Cervical back: Normal range of motion.  Skin:    General: Skin is warm and dry.  Neurological:     General: No focal deficit present.     Mental Status: He is alert.  Psychiatric:        Mood and Affect: Affect is flat.        Behavior: Behavior is withdrawn. Behavior is cooperative.        Thought Content: Thought content includes suicidal ideation.     ED Results / Procedures / Treatments   Labs (all labs ordered are listed, but only abnormal results are displayed) Labs Reviewed  COMPREHENSIVE METABOLIC PANEL - Abnormal; Notable for the following components:      Result Value   Calcium 8.7 (*)    AST 14 (*)    All other components within normal limits  CBC WITH DIFFERENTIAL/PLATELET - Abnormal; Notable for the following components:   Hemoglobin 12.8 (*)    MCV 78.9 (*)    MCH 25.4 (*)    Platelets 148 (*)    All other components within normal limits  SALICYLATE LEVEL - Abnormal; Notable for the following components:   Salicylate Lvl <7.0 (*)    All other components within normal limits  ACETAMINOPHEN LEVEL - Abnormal; Notable for the following components:   Acetaminophen (Tylenol), Serum <10 (*)    All other components within normal limits  ETHANOL  RAPID URINE DRUG SCREEN, HOSP PERFORMED    EKG None  Radiology No results found.  Procedures Procedures    Medications Ordered in ED Medications - No data to display  ED Course/ Medical Decision Making/ A&P                                 Medical Decision Making Patient is presenting voluntarily for assistance with suicidal ideation without attempt.  Will medically screen and have TTS evaluate patient.  Amount and/or Complexity of Data Reviewed Labs: ordered.    Details: Labs revealed and stable, still pending UDS, however these results would not change  his need for psych consult which has been ordered.           Final Clinical Impression(s) / ED Diagnoses Final diagnoses:  Suicidal ideation    Rx / DC Orders ED Discharge Orders     None         Victoriano Lain 07/28/23 1934    Royanne Foots, DO 08/06/23 1036

## 2023-07-28 NOTE — ED Notes (Signed)
Pt in hallway bed in front of nurse desk and appears to be sleeping at this time.

## 2023-07-29 ENCOUNTER — Inpatient Hospital Stay (HOSPITAL_COMMUNITY)
Admission: AD | Admit: 2023-07-29 | Discharge: 2023-08-02 | DRG: 885 | Disposition: A | Discharge status: Home / Self Care | Payer: Medicaid Other | Source: Intra-hospital | Attending: Psychiatry | Admitting: Psychiatry

## 2023-07-29 ENCOUNTER — Encounter (HOSPITAL_COMMUNITY): Payer: Self-pay | Admitting: Psychiatry

## 2023-07-29 DIAGNOSIS — R45851 Suicidal ideations: Secondary | ICD-10-CM

## 2023-07-29 DIAGNOSIS — M549 Dorsalgia, unspecified: Secondary | ICD-10-CM | POA: Diagnosis present

## 2023-07-29 DIAGNOSIS — B1920 Unspecified viral hepatitis C without hepatic coma: Secondary | ICD-10-CM | POA: Diagnosis present

## 2023-07-29 DIAGNOSIS — F17200 Nicotine dependence, unspecified, uncomplicated: Secondary | ICD-10-CM | POA: Diagnosis present

## 2023-07-29 DIAGNOSIS — F1510 Other stimulant abuse, uncomplicated: Secondary | ICD-10-CM | POA: Diagnosis present

## 2023-07-29 DIAGNOSIS — R7689 Other specified abnormal immunological findings in serum: Secondary | ICD-10-CM | POA: Diagnosis present

## 2023-07-29 DIAGNOSIS — G8929 Other chronic pain: Secondary | ICD-10-CM | POA: Diagnosis present

## 2023-07-29 DIAGNOSIS — F1210 Cannabis abuse, uncomplicated: Secondary | ICD-10-CM | POA: Diagnosis present

## 2023-07-29 DIAGNOSIS — F250 Schizoaffective disorder, bipolar type: Secondary | ICD-10-CM | POA: Diagnosis present

## 2023-07-29 DIAGNOSIS — F4310 Post-traumatic stress disorder, unspecified: Secondary | ICD-10-CM | POA: Diagnosis present

## 2023-07-29 DIAGNOSIS — D696 Thrombocytopenia, unspecified: Secondary | ICD-10-CM | POA: Diagnosis present

## 2023-07-29 DIAGNOSIS — F1410 Cocaine abuse, uncomplicated: Secondary | ICD-10-CM | POA: Diagnosis present

## 2023-07-29 DIAGNOSIS — Z91199 Patient's noncompliance with other medical treatment and regimen due to unspecified reason: Secondary | ICD-10-CM

## 2023-07-29 DIAGNOSIS — F17210 Nicotine dependence, cigarettes, uncomplicated: Secondary | ICD-10-CM | POA: Diagnosis present

## 2023-07-29 DIAGNOSIS — F1120 Opioid dependence, uncomplicated: Secondary | ICD-10-CM | POA: Diagnosis present

## 2023-07-29 DIAGNOSIS — G629 Polyneuropathy, unspecified: Secondary | ICD-10-CM | POA: Diagnosis present

## 2023-07-29 DIAGNOSIS — Z716 Tobacco abuse counseling: Secondary | ICD-10-CM

## 2023-07-29 DIAGNOSIS — Z560 Unemployment, unspecified: Secondary | ICD-10-CM

## 2023-07-29 DIAGNOSIS — F1090 Alcohol use, unspecified, uncomplicated: Secondary | ICD-10-CM | POA: Diagnosis present

## 2023-07-29 DIAGNOSIS — R768 Other specified abnormal immunological findings in serum: Secondary | ICD-10-CM | POA: Diagnosis present

## 2023-07-29 DIAGNOSIS — Z5900 Homelessness unspecified: Secondary | ICD-10-CM

## 2023-07-29 DIAGNOSIS — F411 Generalized anxiety disorder: Secondary | ICD-10-CM | POA: Diagnosis present

## 2023-07-29 DIAGNOSIS — Z9151 Personal history of suicidal behavior: Secondary | ICD-10-CM

## 2023-07-29 DIAGNOSIS — Z79899 Other long term (current) drug therapy: Secondary | ICD-10-CM | POA: Diagnosis not present

## 2023-07-29 DIAGNOSIS — Z886 Allergy status to analgesic agent status: Secondary | ICD-10-CM | POA: Diagnosis not present

## 2023-07-29 DIAGNOSIS — R001 Bradycardia, unspecified: Secondary | ICD-10-CM | POA: Diagnosis present

## 2023-07-29 DIAGNOSIS — Z23 Encounter for immunization: Secondary | ICD-10-CM

## 2023-07-29 DIAGNOSIS — Z888 Allergy status to other drugs, medicaments and biological substances status: Secondary | ICD-10-CM

## 2023-07-29 DIAGNOSIS — F1994 Other psychoactive substance use, unspecified with psychoactive substance-induced mood disorder: Secondary | ICD-10-CM | POA: Diagnosis present

## 2023-07-29 DIAGNOSIS — Z7151 Drug abuse counseling and surveillance of drug abuser: Secondary | ICD-10-CM

## 2023-07-29 DIAGNOSIS — D509 Iron deficiency anemia, unspecified: Secondary | ICD-10-CM | POA: Diagnosis present

## 2023-07-29 HISTORY — DX: Cardiac arrhythmia, unspecified: I49.9

## 2023-07-29 LAB — RAPID URINE DRUG SCREEN, HOSP PERFORMED
Amphetamines: NOT DETECTED
Barbiturates: NOT DETECTED
Benzodiazepines: POSITIVE — AB
Cocaine: POSITIVE — AB
Opiates: NOT DETECTED
Tetrahydrocannabinol: POSITIVE — AB

## 2023-07-29 MED ORDER — DIPHENHYDRAMINE HCL 50 MG/ML IJ SOLN: 25.0000 mg | Freq: Four times a day (QID) | INTRAMUSCULAR | Status: DC | PRN

## 2023-07-29 MED ORDER — HALOPERIDOL LACTATE 5 MG/ML IJ SOLN: 5.0000 mg | Freq: Four times a day (QID) | INTRAMUSCULAR | Status: DC | PRN

## 2023-07-29 MED ORDER — CHLORDIAZEPOXIDE HCL 25 MG PO CAPS
25.0000 mg | ORAL_CAPSULE | Freq: Three times a day (TID) | ORAL | Status: AC
  Administered 2023-07-30 – 2023-07-31 (×3): 25 mg via ORAL
  Filled 2023-07-29 (×3): qty 1

## 2023-07-29 MED ORDER — THIAMINE HCL 100 MG/ML IJ SOLN
100.0000 mg | Freq: Once | INTRAMUSCULAR | Status: AC
  Administered 2023-07-29: 100 mg via INTRAMUSCULAR
  Filled 2023-07-29: qty 2

## 2023-07-29 MED ORDER — MAGNESIUM HYDROXIDE 400 MG/5ML PO SUSP: 30.0000 mL | Freq: Every day | ORAL | Status: DC | PRN

## 2023-07-29 MED ORDER — ADULT MULTIVITAMIN W/MINERALS CH
1.0000 | ORAL_TABLET | Freq: Every day | ORAL | Status: DC
  Administered 2023-07-29 – 2023-08-02 (×5): 1 via ORAL
  Filled 2023-07-29 (×7): qty 1

## 2023-07-29 MED ORDER — BUPRENORPHINE HCL-NALOXONE HCL 8-2 MG SL SUBL
1.0000 | SUBLINGUAL_TABLET | Freq: Two times a day (BID) | SUBLINGUAL | Status: DC
  Administered 2023-07-29 – 2023-08-02 (×8): 1 via SUBLINGUAL
  Filled 2023-07-29 (×8): qty 1

## 2023-07-29 MED ORDER — LOPERAMIDE HCL 2 MG PO CAPS: 2.0000 mg | ORAL_CAPSULE | ORAL | Status: AC | PRN

## 2023-07-29 MED ORDER — DIVALPROEX SODIUM 500 MG PO DR TAB
500.0000 mg | DELAYED_RELEASE_TABLET | Freq: Two times a day (BID) | ORAL | Status: DC
  Administered 2023-07-29 – 2023-08-02 (×8): 500 mg via ORAL
  Filled 2023-07-29 (×12): qty 1

## 2023-07-29 MED ORDER — CHLORDIAZEPOXIDE HCL 25 MG PO CAPS
25.0000 mg | ORAL_CAPSULE | ORAL | Status: AC
  Administered 2023-07-31 – 2023-08-01 (×2): 25 mg via ORAL
  Filled 2023-07-29 (×2): qty 1

## 2023-07-29 MED ORDER — CHLORDIAZEPOXIDE HCL 25 MG PO CAPS: 25.0000 mg | ORAL_CAPSULE | Freq: Four times a day (QID) | ORAL | Status: AC | PRN

## 2023-07-29 MED ORDER — GABAPENTIN 400 MG PO CAPS
800.0000 mg | ORAL_CAPSULE | Freq: Three times a day (TID) | ORAL | Status: DC
  Administered 2023-07-29 – 2023-08-02 (×11): 800 mg via ORAL
  Filled 2023-07-29 (×15): qty 2

## 2023-07-29 MED ORDER — ONDANSETRON 4 MG PO TBDP: 4.0000 mg | ORAL_TABLET | Freq: Four times a day (QID) | ORAL | Status: AC | PRN

## 2023-07-29 MED ORDER — HALOPERIDOL 5 MG PO TABS: 5.0000 mg | ORAL_TABLET | Freq: Four times a day (QID) | ORAL | Status: DC | PRN

## 2023-07-29 MED ORDER — CHLORDIAZEPOXIDE HCL 25 MG PO CAPS
25.0000 mg | ORAL_CAPSULE | Freq: Four times a day (QID) | ORAL | Status: AC
  Administered 2023-07-29 – 2023-07-30 (×4): 25 mg via ORAL
  Filled 2023-07-29 (×4): qty 1

## 2023-07-29 MED ORDER — OLANZAPINE 10 MG IM SOLR: 10.0000 mg | Freq: Three times a day (TID) | INTRAMUSCULAR | Status: DC | PRN

## 2023-07-29 MED ORDER — LORAZEPAM 1 MG PO TABS: 1.0000 mg | ORAL_TABLET | Freq: Four times a day (QID) | ORAL | Status: DC | PRN

## 2023-07-29 MED ORDER — LORAZEPAM 2 MG/ML IJ SOLN: 1.0000 mg | Freq: Four times a day (QID) | INTRAMUSCULAR | Status: DC | PRN

## 2023-07-29 MED ORDER — ACETAMINOPHEN 325 MG PO TABS: 650.0000 mg | ORAL_TABLET | Freq: Four times a day (QID) | ORAL | Status: DC | PRN

## 2023-07-29 MED ORDER — INFLUENZA VIRUS VACC SPLIT PF (FLUZONE) 0.5 ML IM SUSY
0.5000 mL | PREFILLED_SYRINGE | INTRAMUSCULAR | Status: AC
  Administered 2023-07-30: 0.5 mL via INTRAMUSCULAR
  Filled 2023-07-29: qty 0.5

## 2023-07-29 MED ORDER — DIPHENHYDRAMINE HCL 25 MG PO CAPS: 25.0000 mg | ORAL_CAPSULE | Freq: Four times a day (QID) | ORAL | Status: DC | PRN

## 2023-07-29 MED ORDER — RISPERIDONE 1 MG PO TBDP
1.0000 mg | ORAL_TABLET | Freq: Two times a day (BID) | ORAL | Status: DC
  Administered 2023-07-29 – 2023-08-02 (×8): 1 mg via ORAL
  Filled 2023-07-29 (×10): qty 1

## 2023-07-29 MED ORDER — CHLORDIAZEPOXIDE HCL 25 MG PO CAPS
25.0000 mg | ORAL_CAPSULE | Freq: Every day | ORAL | Status: AC
  Administered 2023-08-02: 25 mg via ORAL
  Filled 2023-07-29: qty 1

## 2023-07-29 MED ORDER — ALUM & MAG HYDROXIDE-SIMETH 200-200-20 MG/5ML PO SUSP: 30.0000 mL | ORAL | Status: DC | PRN

## 2023-07-29 MED ORDER — OLANZAPINE 5 MG PO TBDP: 5.0000 mg | ORAL_TABLET | Freq: Three times a day (TID) | ORAL | Status: DC | PRN

## 2023-07-29 MED ORDER — OLANZAPINE 10 MG IM SOLR: 5.0000 mg | Freq: Three times a day (TID) | INTRAMUSCULAR | Status: DC | PRN

## 2023-07-29 MED ORDER — NICOTINE 14 MG/24HR TD PT24
14.0000 mg | MEDICATED_PATCH | Freq: Every day | TRANSDERMAL | Status: DC
  Administered 2023-07-29 – 2023-08-02 (×5): 14 mg via TRANSDERMAL
  Filled 2023-07-29 (×7): qty 1

## 2023-07-29 MED ORDER — HYDROXYZINE HCL 25 MG PO TABS
25.0000 mg | ORAL_TABLET | Freq: Four times a day (QID) | ORAL | Status: AC | PRN
Start: 2023-07-29 — End: 2023-08-01
  Administered 2023-07-31: 25 mg via ORAL
  Filled 2023-07-29: qty 1

## 2023-07-29 NOTE — Progress Notes (Signed)
Pt was accepted to [facility] TODAY 07/29/2023; Bed Assignment PENDING Signed Voluntary Consent uploaded to pt's chart or faxed to [facility] [phone]  Pt meets inpatient criteria per Mancel Bale, NP   Attending Physician will be Dr. Phineas Inches, MD  Report can be called to: Adult unit: [phone]  Pt can arrive after: 9:00am PENDING completion of PENDING items.  Care Team notified: Night [facility] Oluwatosin Olasunkanmi,RN, [place], [facility], [place], [name], NP, [address] [place], [facility]   [place], [place] 07/29/2023 @ 12:19 AM

## 2023-07-29 NOTE — Progress Notes (Addendum)
     07/29/23 2030  Psych Admission Type (Psych Patients Only)  Admission Status Voluntary  Psychosocial Assessment  Patient Complaints Substance abuse  Eye Contact Fair  Facial Expression Flat  Affect Blunted  Speech Soft  Interaction Cautious  Motor Activity Slow  Appearance/Hygiene Disheveled  Behavior Characteristics Cooperative  Mood Depressed;Suspicious  Aggressive Behavior  Effect No apparent injury  Thought Process  Coherency WDL  Content WDL  Delusions WDL  Perception WDL  Hallucination None reported or observed  Judgment Impaired  Confusion WDL  Danger to Self  Current suicidal ideation? Passive  Self-Injurious Behavior Some self-injurious ideation observed or expressed.  No lethal plan expressed   Agreement Not to Harm Self Yes  Description of Agreement verbal contract for safety

## 2023-07-29 NOTE — H&P (Cosign Needed Addendum)
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 otherwise unremarkable CBC showing microcytic anemia (Hgb 12.8, MCV 78.9), and mild thrombocytopenia (platelets 1 48K) Salicylate and acetaminophen levels negative for toxicity Ethanol <10   3. Medical Issues Being Addressed:   #Tobacco Use Disorder  Nicotine patch 21mg /24 hours ordered  Smoking cessation encouraged  # Microcytic anemia (Hgb 12.8, MCV 78.9)  Ferritin, iron, TIBC pending Requires pcp follow up  4. Discharge Planning:   -- Social work and case management to assist with discharge planning and identification of hospital follow-up needs prior to discharge  -- Estimated Discharge Date: To be determined  -- Discharge Concerns: Need to establish a safety plan; Medication compliance and effectiveness  -- Discharge Goals: Return home with outpatient referrals for mental health follow-up including  medication management/psychotherapy   I certify that inpatient services furnished can reasonably be expected to improve the patient's condition.   This note was created using a voice recognition software as a result there may be grammatical errors inadvertently enclosed that do not reflect the nature of this encounter. Every attempt is made to correct such errors.   Signed: Dr. Liston Alba, MD PGY-2, Psychiatry Residency  11/29/20243:40 PM
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
## 2023-07-29 NOTE — Plan of Care (Signed)
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
## 2023-07-29 NOTE — Tx Team (Signed)
Initial Treatment Plan 07/29/2023 11:22 AM [name] OAC:[id number]    PATIENT STRESSORS: Financial difficulties   Substance abuse     PATIENT STRENGTHS: Ability for insight  Active sense of humor  Average or above average intelligence  Capable of independent living    PATIENT IDENTIFIED PROBLEMS:         Depression  Suicidal ideation   Polysubstance use              DISCHARGE CRITERIA:  Ability to meet basic life and health needs Adequate post-discharge living arrangements Improved stabilization in mood, thinking, and/or behavior Medical problems require only outpatient monitoring Need for constant or close observation no longer present Reduction of life-threatening or endangering symptoms to within safe limits Safe-care adequate arrangements made  PRELIMINARY DISCHARGE PLAN: Attend PHP/IOP Attend 12-step recovery group Outpatient therapy  PATIENT/FAMILY INVOLVEMENT: This treatment plan has been presented to and reviewed with the patient, Cole [name], and/or family member, .  The patient and family have been given the opportunity to ask questions and make suggestions.  Malva Limes, RN 07/29/2023, 11:22 AM
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
## 2023-07-29 NOTE — ED Provider Notes (Addendum)
Patient has been accepted by behavioral health for concerns for suicidal chronic risk factors.  They have requested an EKG because of him having used cocaine.  Patient's EKG shows a sinus bradycardia with a heart rate of 56.  No other acute findings.     Vanetta Mulders, MD 07/29/23 [phone]   I provided a substantive portion of the care of this patient.  I personally made/approved the management plan for this patient and take responsibility for the patient management.  EKG Interpretation Date/Time:  Friday July 29 2023 08:43:46 EST Ventricular Rate:  56 PR Interval:  184 QRS Duration:  98 QT Interval:  438 QTC Calculation: 422 R Axis:   62  Text Interpretation: Sinus bradycardia Cannot rule out Anterior infarct , age undetermined Abnormal ECG When compared with ECG of 14-Feb-2023 01:01, Vent. rate has decreased BY  27 BPM QRS axis Shifted left T wave inversion now evident in Anterior leads Confirmed by Vanetta Mulders [phone]) on 07/29/2023 8:49:30 AM   Patient seen by me.  Patient evaluated behavioral health.  They feel he needs to be admitted.   Vanetta Mulders, MD 07/29/23 [phone]

## 2023-07-29 NOTE — Plan of Care (Signed)
  Problem: Safety: Goal: Periods of time without injury will increase Outcome: Progressing   Problem: Education: Goal: Emotional status will improve Outcome: Not Progressing Goal: Mental status will improve Outcome: Not Progressing   Problem: Activity: Goal: Interest or engagement in activities will improve Outcome: Not Progressing

## 2023-07-29 NOTE — ED Notes (Signed)
EKG requested by Nurse at [facility] d/t cocaine use before coming to facility.

## 2023-07-30 DIAGNOSIS — R45851 Suicidal ideations: Secondary | ICD-10-CM | POA: Diagnosis not present

## 2023-07-30 LAB — IRON AND TIBC
Iron: 48 ug/dL (ref 45–182)
Saturation Ratios: 11 % — ABNORMAL LOW (ref 17.9–39.5)
TIBC: 421 ug/dL (ref 250–450)
UIBC: 373 ug/dL

## 2023-07-30 LAB — TSH: TSH: 3.653 u[IU]/mL (ref 0.350–4.500)

## 2023-07-30 LAB — LIPID PANEL
Cholesterol: 147 mg/dL (ref 0–200)
HDL: 56 mg/dL (ref >40)
LDL Cholesterol: 74 mg/dL (ref 0–99)
Total CHOL/HDL Ratio: 2.6 {ratio}
Triglycerides: 83 mg/dL (ref <150)
VLDL: 17 mg/dL (ref 0–40)

## 2023-07-30 LAB — HEPATITIS PANEL, ACUTE
HCV Ab: REACTIVE — AB
Hep A IgM: NONREACTIVE
Hep B C IgM: NONREACTIVE
Hepatitis B Surface Ag: NONREACTIVE

## 2023-07-30 LAB — HEMOGLOBIN A1C
Hgb A1c MFr Bld: 5.5 % (ref 4.8–5.6)
Mean Plasma Glucose: 111.15 mg/dL

## 2023-07-30 LAB — VALPROIC ACID LEVEL: Valproic Acid Lvl: 58 ug/mL (ref 50.0–100.0)

## 2023-07-30 LAB — FERRITIN: Ferritin: 26 ng/mL (ref 24–336)

## 2023-07-30 MED ORDER — NICOTINE POLACRILEX 2 MG MT GUM
2.0000 mg | CHEWING_GUM | OROMUCOSAL | Status: DC | PRN
  Administered 2023-07-31 – 2023-08-02 (×7): 2 mg via ORAL
  Filled 2023-07-30 (×6): qty 1

## 2023-07-30 NOTE — Progress Notes (Signed)
   07/30/23 2005  Psych Admission Type (Psych Patients Only)  Admission Status Voluntary  Psychosocial Assessment  Patient Complaints Substance abuse  Eye Contact Fair  Facial Expression Flat  Affect Appropriate to circumstance;Sad  Speech Soft  Interaction Guarded  Motor Activity Slow  Appearance/Hygiene In scrubs  Behavior Characteristics Cooperative;Appropriate to situation  Mood Depressed  Thought Process  Coherency WDL  Content WDL  Delusions None reported or observed  Perception WDL  Hallucination None reported or observed  Judgment Poor  Confusion None  Danger to Self  Current suicidal ideation? Denies

## 2023-07-30 NOTE — Group Note (Signed)
LCSW Group Therapy Note  Group Date: 07/30/2023 Start Time: 1000 End Time: 1100   Type of Therapy and Topic:  Group Therapy - Healthy vs Unhealthy Coping Skills  Participation Level:  Minimal   Description of Group The focus of this group was to determine what unhealthy coping techniques typically are used by group members and what healthy coping techniques would be helpful in coping with various problems. Patients were guided in becoming aware of the differences between healthy and unhealthy coping techniques. Patients were asked to identify 2-3 healthy coping skills they would like to learn to use more effectively.  Therapeutic Goals Patients learned that coping is what human beings do all day long to deal with various situations in their lives Patients defined and discussed healthy vs unhealthy coping techniques Patients identified their preferred coping techniques and identified whether these were healthy or unhealthy Patients determined 2-3 healthy coping skills they would like to become more familiar with and use more often. Patients provided support and ideas to each other   Summary of Patient Progress:  During group, Cole [name] expressed that going to his job and following directions is a healthy coping skill, while not going to work and losing his job is unhealthy. Patient was called out to see a doctor, then returned to the group only briefly before leaving again.  Patient demonstrated little insight into the subject matter, was respectful of peers, and participated throughout the entire session.   Therapeutic Modalities Cognitive Behavioral Therapy Motivational Interviewing  [name] 07/30/2023  11:17 AM

## 2023-07-30 NOTE — Progress Notes (Signed)
   07/30/23 0951  Psych Admission Type (Psych Patients Only)  Admission Status Voluntary  Psychosocial Assessment  Patient Complaints Anxiety  Eye Contact Fair  Facial Expression Flat  Affect Flat  Speech Soft  Interaction Cautious  Motor Activity Slow  Appearance/Hygiene Disheveled  Behavior Characteristics Cooperative  Mood Depressed;Anxious  Thought Process  Coherency WDL  Content WDL  Delusions WDL  Perception WDL  Hallucination None reported or observed  Judgment Impaired  Confusion WDL  Danger to Self  Current suicidal ideation? Denies  Danger to Others  Danger to Others None reported or observed

## 2023-07-30 NOTE — Plan of Care (Signed)

## 2023-07-30 NOTE — Progress Notes (Signed)
Patient ID: Cole [name], male   DOB: May 07, 1981, 43 y.o.   MRN: [id number] Pt presents with depressed mood, affect congruent. Cole [name] denies any acute concerns, states overall '' I'm doing okay, I'm eating good. '' He denies any acute concerns at this time and denies any SI HI or AV Hallucinations. No signs of acute withdrawal noted. Pt has been compliant with medications and has been visible on the unit going to programming and meals. He is safe, will con't to monitor.
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
## 2023-07-30 NOTE — Plan of Care (Signed)
  Problem: Safety: Goal: Periods of time without injury will increase Outcome: Progressing   

## 2023-07-30 NOTE — Group Note (Unsigned)
Date:  07/31/2023 Time:  12:19 AM  Group Topic/Focus:  Wrap-Up Group:   The focus of this group is to help patients review their daily goal of treatment and discuss progress on daily workbooks.    Participation Level:  Active  Participation Quality:  Appropriate and Sharing  Affect:  Appropriate  Cognitive:  Appropriate  Insight: Appropriate and Limited  Engagement in Group:  Engaged  Modes of Intervention:  Activity and Socialization  Additional Comments:  Patient stated that he is doing "good" and stated that he had a "good day"/ Patient stated that he interacted with others on the unit throughout the day. Patient was limited and did not share much. Patient rated his day a 8/10. Patient participated in activity after sharing.   Kennieth Francois 07/31/2023, 12:19 AM

## 2023-07-30 NOTE — [facility] Group Notes (Signed)
[facility] Group Notes:  (Nursing/MHT/Case Management/Adjunct)  Date:  07/30/2023  Time:  2:36 PM  Type of Therapy:  Psychoeducational Skills  Participation Level:  Did Not Attend  Participation Quality:   na  Affect:    Cognitive:    Insight:    Engagement in Group:    Modes of Intervention:    Summary of Progress/Problems: Psychoeducational group in which patients were educated on the impact of negative thinking, positive reframing and mindfulness. Pt did not attend despite enocuragement.  Malva Limes 07/30/2023, 2:36 PM

## 2023-07-31 DIAGNOSIS — R45851 Suicidal ideations: Secondary | ICD-10-CM | POA: Diagnosis not present

## 2023-07-31 NOTE — Progress Notes (Signed)
D) Pt received calm, visible, participating in milieu, and in no acute distress. Pt A & O x4. Pt denies SI, HI, A/ V H, depression, anxiety and endorses pain at this time. A) Pt encouraged to drink fluids. Pt encouraged to come to staff with needs. Pt encouraged to attend and participate in groups. Pt encouraged to set reachable goals.  R) Pt remained safe on unit, in no acute distress, will continue to assess.     07/31/23 2000  Psych Admission Type (Psych Patients Only)  Admission Status Voluntary  Psychosocial Assessment  Patient Complaints Substance abuse  Eye Contact Fair  Facial Expression Flat  Affect Appropriate to circumstance  Speech Soft  Interaction Assertive  Motor Activity Slow  Appearance/Hygiene Disheveled  Behavior Characteristics Appropriate to situation  Mood Depressed  Thought Process  Coherency WDL  Content WDL  Delusions None reported or observed  Perception WDL  Hallucination None reported or observed  Judgment Poor  Confusion None  Danger to Self  Current suicidal ideation? Denies  Agreement Not to Harm Self Yes  Description of Agreement verbal  Danger to Others  Danger to Others None reported or observed  Danger to Others Abnormal  Harmful Behavior to others No threats or harm toward other people

## 2023-07-31 NOTE — [facility] Group Notes (Signed)
[facility] Group Notes:  (Nursing/MHT/Case Management/Adjunct)  Date:  07/31/2023  Time:  9:22 PM  Type of Therapy:  Psychoeducational Skills  Participation Level:  Active  Participation Quality:  Appropriate  Affect:  Appropriate  Cognitive:  Appropriate  Insight:  Good  Engagement in Group:  Engaged  Modes of Intervention:  Education  Summary of Progress/Problems: Patient rated his day as a 5 or 6 out of 10. He verbalized in group that he doesn't know where will be going after discharge and that he would like to go to work. In addition, he mentioned that he intends to "stay positive".   Cole [name] S 07/31/2023, 9:22 PM

## 2023-07-31 NOTE — Progress Notes (Signed)
Pt did not attend goals group. 

## 2023-07-31 NOTE — BHH Counselor (Signed)
CSW met with the Patient to go over shelter options for when he is discharged. Reviewed options in the [place] area where he had mentioned that his mother and friends live. Pt reports his mother will let him stay with her only if he secures a job first to help her pay the bills. Pt had a long list of phone numbers showing that he has been calling places to get a job. Assisted client in calling his former employer to inquire about employment. The manager stated they were not hiring at the moment but for the patient to come by next week. Pt plans to call the shelter in [place] and one in [place] first thing tomorrow morning. Advised him to share his plan for housing with the social worker and his treatment team tomorrow to prepare for discharge planning.   Steffon Gladu LCSWA
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
## 2023-07-31 NOTE — [facility] Group Notes (Signed)
[facility] Group Notes:  (Nursing/MHT/Case Management/Adjunct)  Date:  07/31/2023  Time:  12:58 PM  Type of Therapy:  Psychoeducational Skills  Participation Level:  Active  Participation Quality:  Appropriate  Affect:  Appropriate  Cognitive:  Alert and Appropriate  Insight:  Appropriate  Engagement in Group:  Engaged  Modes of Intervention:  Discussion and Education  Summary of Progress/Problems:  Patients were given education on motivation with a podcast from '' [employer] '' in which the guest was DR. ALOK K who talked about dopamine, and tapping into the power you have to get where you want to go in life. ''  Pt attended and was appropriate.   Malva Limes 07/31/2023, 12:58 PM

## 2023-07-31 NOTE — Plan of Care (Signed)
  Problem: Coping: Goal: Ability to verbalize frustrations and anger appropriately will improve Outcome: Progressing Goal: Ability to demonstrate self-control will improve Outcome: Progressing   

## 2023-07-31 NOTE — Progress Notes (Addendum)
D. Pt presents with a flat affect/ depressed mood- rated his depression,hopelessness and anxiety a 4/5/6, respectively. Pt's stated goal today is "to work on getting somewhere to go and getting a job." Pt has been visible in the milieu at times and observed attending group. Marland Kitchen Pt currently denies withdrawal symptoms, SI/HI and AVH   A. Labs and vitals monitored. Pt given and educated on medications. Pt supported emotionally and encouraged to express concerns and ask questions.   R. Pt remains safe with 15 minute checks. Will continue POC.    07/31/23 1400  Psych Admission Type (Psych Patients Only)  Admission Status Voluntary  Psychosocial Assessment  Patient Complaints Anxiety  Eye Contact Fair  Facial Expression Flat  Affect Inconsistent with thought content;Flat  Speech Soft  Interaction Assertive  Motor Activity Slow  Appearance/Hygiene Disheveled  Behavior Characteristics Cooperative;Appropriate to situation  Mood Anxious;Depressed  Thought Process  Coherency WDL  Content WDL  Delusions None reported or observed  Perception WDL  Hallucination None reported or observed  Judgment Poor  Confusion None  Danger to Self  Current suicidal ideation? Denies  Danger to Others  Danger to Others None reported or observed  Danger to Others Abnormal  Harmful Behavior to others No threats or harm toward other people

## 2023-07-31 NOTE — [facility] Counselor (Signed)
Pt was able to reach [place] at [facility] (in [place]). She asked that social worker give her a call, CSW called left a message.   Warrene Kapfer LCSWA

## 2023-08-01 ENCOUNTER — Encounter (HOSPITAL_COMMUNITY): Payer: Self-pay

## 2023-08-01 DIAGNOSIS — R45851 Suicidal ideations: Secondary | ICD-10-CM | POA: Diagnosis not present

## 2023-08-01 MED ORDER — WHITE PETROLATUM EX OINT
TOPICAL_OINTMENT | CUTANEOUS | Status: AC
  Administered 2023-08-01: 1
  Filled 2023-08-01: qty 5

## 2023-08-01 NOTE — [facility] Suicide Risk Assessment (Signed)
[facility] INPATIENT:  Family/Significant Other Suicide Prevention Education  Suicide Prevention Education:  Education Completed; Mckenzy Eales [phone],  (name of family member/significant other) has been identified by the patient as the family member/significant other with whom the patient will be residing, and identified as the person(s) who will aid the patient in the event of a mental health crisis (suicidal ideations/suicide attempt).  With written consent from the patient, the family member/significant other has been provided the following suicide prevention education, prior to the and/or following the discharge of the patient.  The suicide prevention education provided includes the following: Suicide risk factors Suicide prevention and interventions National Suicide Hotline telephone number [facility] assessment telephone number [facility] Emergency Assistance 911 [facility] and/or Residential Mobile Crisis Unit telephone number  Request made of family/significant other to: Remove weapons (e.g., guns, rifles, knives), all items previously/currently identified as safety concern.   Remove drugs/medications (over-the-counter, prescriptions, illicit drugs), all items previously/currently identified as a safety concern.  LCSW went and spoke with patient regarding disposition plans. Patient reports his plan is to discharge back home with his mother in order to obtain a job to help her out with bills. Patient declines interest in residential placement at this time and states he follows up on an outpatient basis with an agency out in [place] virtually for therapy two times a week, and reports he will continue those services. Agency's name is [facility] [phone]; Patient reports he is also seen by the same facility for medication management. Patient reports an interest in wanting to stop smoking cigarettes. Patient reports an interest in the quitline and is aware  that resources will be provided for him at discharge. Patient has provided LCSW with permission to follow up with his mother to safety plan. No other needs to report.   LCSW contacted the patient's mother Geovany Schoenbachler at number listed above. Mother reports the patient is able to return back to her home once he stable for discharge. Mother confirmed outpatient follow up for the patient, however reports she lets him handle his own affairs since he is grown. Mother denies any firearms or weapons being within the home. Mother reports reports she does not believe the patient has access to these items as well. Mother reports she does not have the means to transport the patient once he is ready, so patient will need transport back home. Mother reports no safety concerns and provided address that patient would report to: [address] [place], [place] [address].   The family member/significant other verbalizes understanding of the suicide prevention education information provided.  The family member/significant other agrees to remove the items of safety concern listed above.  Loleta Dicker 08/01/2023, 10:48 AM

## 2023-08-01 NOTE — BH IP Treatment Plan (Signed)
Interdisciplinary Treatment and Diagnostic Plan Update  08/01/2023 Time of Session: 10:30AM Cole [name] MRN: [id number]  Principal Diagnosis: Schizoaffective disorder, bipolar type [facility])  Secondary Diagnoses: Principal Problem:   Schizoaffective disorder, bipolar type (HCC) Active Problems:   Opioid use disorder, severe, dependence (HCC) per history   Tobacco use disorder   Substance induced mood disorder (HCC)   Suicidal ideations   Current Medications:  Current Facility-Administered Medications  Medication Dose Route Frequency Provider Last Rate Last Admin   alum & mag hydroxide-simeth (MAALOX/MYLANTA) 200-200-20 MG/5ML suspension 30 mL  30 mL Oral Q4H PRN [name], NP       buprenorphine-naloxone (SUBOXONE) 8-2 mg per SL tablet 1 tablet  1 tablet Sublingual Q12H [name], [name], MD   1 tablet at 08/01/23 0755   [START ON 08/02/2023] chlordiazePOXIDE (LIBRIUM) capsule 25 mg  25 mg Oral Daily [name], [name], MD       diphenhydrAMINE (BENADRYL) capsule 25 mg  25 mg Oral Q6H PRN [name], [name], MD       Or   diphenhydrAMINE (BENADRYL) injection 25 mg  25 mg Intramuscular Q6H PRN [name], [name], MD       divalproex (DEPAKOTE) DR tablet 500 mg  500 mg Oral Q12H [name], [name], MD   500 mg at 08/01/23 0755   gabapentin (NEURONTIN) capsule 800 mg  800 mg Oral TID Lorri Frederick, MD   800 mg at 08/01/23 0755   haloperidol (HALDOL) tablet 5 mg  5 mg Oral Q6H PRN [name], [name], MD       Or   haloperidol lactate (HALDOL) injection 5 mg  5 mg Intramuscular Q6H PRN [name], [name], MD       LORazepam (ATIVAN) tablet 1 mg  1 mg Oral Q6H PRN [name], [name], MD       Or   LORazepam (ATIVAN) injection 1 mg  1 mg Intramuscular Q6H PRN [name], [name], MD       magnesium hydroxide (MILK OF MAGNESIA) suspension 30 mL  30 mL Oral Daily PRN [name], NP       multivitamin with minerals  tablet 1 tablet  1 tablet Oral Daily [name], [name], MD   1 tablet at 08/01/23 0755   nicotine (NICODERM CQ - dosed in mg/24 hours) patch 14 mg  14 mg Transdermal Daily [name], Nadir, MD   14 mg at 08/01/23 0755   nicotine polacrilex (NICORETTE) gum 2 mg  2 mg Oral PRN [name], MD   2 mg at 08/01/23 0816   risperiDONE (RISPERDAL M-TABS) disintegrating tablet 1 mg  1 mg Oral Q12H [name], [name], MD   1 mg at 08/01/23 0755   PTA Medications: Medications Prior to Admission  Medication Sig Dispense Refill Last Dose   ALPRAZolam (XANAX) 1 MG tablet Take 1 mg by mouth 3 (three) times daily as needed for anxiety or sleep.      baclofen (LIORESAL) 10 MG tablet Take 10 mg by mouth 3 (three) times daily as needed.      divalproex (DEPAKOTE) 500 MG DR tablet Take 500 mg by mouth daily.      gabapentin (NEURONTIN) 800 MG tablet Take 800 mg by mouth 3 (three) times daily.      NARCAN 4 MG/0.1ML LIQD nasal spray kit Place 1 spray into the nose once.      nicotine polacrilex (NICORETTE) 2 MG gum Take 1 each (2 mg total) by mouth as needed for smoking cessation. 100 tablet 0    risperiDONE (RISPERDAL M-TABS)  1 MG disintegrating tablet Take 1 tablet (1 mg total) by mouth 2 (two) times daily. (Patient not taking: Reported on 07/28/2023) 60 tablet 0    SUBOXONE 8-2 MG FILM Place 1 Film under the tongue 2 (two) times daily.      traZODone (DESYREL) 100 MG tablet Take 100 mg by mouth at bedtime.      valbenazine (INGREZZA) 40 MG capsule Take 1 capsule (40 mg total) by mouth daily. 30 capsule 0    VRAYLAR 1.5 MG capsule Take 1.5 mg by mouth daily. (Patient not taking: Reported on 07/28/2023)       Patient Stressors: Financial difficulties   Substance abuse    Patient Strengths: Ability for insight  Active sense of humor  Average or above average intelligence  Capable of independent living   Treatment Modalities: Medication Management, Group therapy, Case management,  1 to 1  session with clinician, Psychoeducation, Recreational therapy.   Physician Treatment Plan for Primary Diagnosis: Schizoaffective disorder, bipolar type (HCC) Long Term Goal(s): Improvement in symptoms so as ready for discharge   Short Term Goals: Ability to identify changes in lifestyle to reduce recurrence of condition will improve Ability to verbalize feelings will improve Ability to disclose and discuss suicidal ideas Ability to demonstrate self-control will improve Ability to identify and develop effective coping behaviors will improve  Medication Management: Evaluate patient's response, side effects, and tolerance of medication regimen.  Therapeutic Interventions: 1 to 1 sessions, Unit Group sessions and Medication administration.  Evaluation of Outcomes: Not Progressing  Physician Treatment Plan for Secondary Diagnosis: Principal Problem:   Schizoaffective disorder, bipolar type (HCC) Active Problems:   Opioid use disorder, severe, dependence (HCC) per history   Tobacco use disorder   Substance induced mood disorder (HCC)   Suicidal ideations  Long Term Goal(s): Improvement in symptoms so as ready for discharge   Short Term Goals: Ability to identify changes in lifestyle to reduce recurrence of condition will improve Ability to verbalize feelings will improve Ability to disclose and discuss suicidal ideas Ability to demonstrate self-control will improve Ability to identify and develop effective coping behaviors will improve     Medication Management: Evaluate patient's response, side effects, and tolerance of medication regimen.  Therapeutic Interventions: 1 to 1 sessions, Unit Group sessions and Medication administration.  Evaluation of Outcomes: Not Progressing   RN Treatment Plan for Primary Diagnosis: Schizoaffective disorder, bipolar type (HCC) Long Term Goal(s): Knowledge of disease and therapeutic regimen to maintain health will improve  Short Term Goals:  Ability to remain free from injury will improve, Ability to verbalize frustration and anger appropriately will improve, Ability to demonstrate self-control, Ability to participate in decision making will improve, Ability to verbalize feelings will improve, Ability to disclose and discuss suicidal ideas, Ability to identify and develop effective coping behaviors will improve, and Compliance with prescribed medications will improve  Medication Management: RN will administer medications as ordered by provider, will assess and evaluate patient's response and provide education to patient for prescribed medication. RN will report any adverse and/or side effects to prescribing provider.  Therapeutic Interventions: 1 on 1 counseling sessions, Psychoeducation, Medication administration, Evaluate responses to treatment, Monitor vital signs and CBGs as ordered, Perform/monitor CIWA, COWS, AIMS and Fall Risk screenings as ordered, Perform wound care treatments as ordered.  Evaluation of Outcomes: Not Progressing   LCSW Treatment Plan for Primary Diagnosis: Schizoaffective disorder, bipolar type (HCC) Long Term Goal(s): Safe transition to appropriate next level of care at discharge, Engage patient in  therapeutic group addressing interpersonal concerns.  Short Term Goals: Engage patient in aftercare planning with referrals and resources, Increase social support, Increase ability to appropriately verbalize feelings, Increase emotional regulation, Facilitate acceptance of mental health diagnosis and concerns, Facilitate patient progression through stages of change regarding substance use diagnoses and concerns, Identify triggers associated with mental health/substance abuse issues, and Increase skills for wellness and recovery  Therapeutic Interventions: Assess for all discharge needs, 1 to 1 time with Social worker, Explore available resources and support systems, Assess for adequacy in community support network,  Educate family and significant other(s) on suicide prevention, Complete Psychosocial Assessment, Interpersonal group therapy.  Evaluation of Outcomes: Not Progressing   Progress in Treatment: Attending groups: Yes. Participating in groups: Yes. Taking medication as prescribed: Yes. Toleration medication: Yes. Family/Significant other contact made: Yes, individual(s) contacted:   Adelfo Palinkas [phone] Patient understands diagnosis: Yes. Discussing patient identified problems/goals with staff: Yes. Medical problems stabilized or resolved: Yes. Denies suicidal/homicidal ideation: Yes. Issues/concerns per patient self-inventory: No.  New problem(s) identified: No, Describe:  none  New Short Term/Long Term Goal(s): medication stabilization, elimination of SI thoughts, development of comprehensive mental wellness plan.    Patient Goals:  "Get my medications good and I want to find a job"  Discharge Plan or Barriers: Patient recently admitted. CSW will continue to follow and assess for appropriate referrals and possible discharge planning.    Reason for Continuation of Hospitalization: Depression Medication stabilization Suicidal ideation  Estimated Length of Stay: 5-7 days  Last 3 [place] Suicide Severity Risk Score: Flowsheet Row [facility] (Current[facility] from 07[facility]28/2023 in [facility] from 06/11/2023 in [facility] at [facility]  C-SSRS RISK CATEGORY High Risk High Risk Error: Q3, 4, or 5 should not be populated when Q2 is No       Last PHQ 2/9 Scores:     No data to display          Scribe for Treatment Team: Kathi Der, LCSWA 08/01/2023 12:00 PM

## 2023-08-01 NOTE — Plan of Care (Signed)
  Problem: Health Behavior/Discharge Planning: Goal: Compliance with treatment plan for underlying cause of condition will improve Outcome: Progressing   Problem: Safety: Goal: Periods of time without injury will increase Outcome: Progressing   

## 2023-08-01 NOTE — Plan of Care (Signed)
  Problem: Education: Goal: Emotional status will improve Outcome: Progressing Goal: Mental status will improve Outcome: Progressing   Problem: Activity: Goal: Sleeping patterns will improve Outcome: Progressing   Problem: Safety: Goal: Periods of time without injury will increase Outcome: Progressing   Problem: Activity: Goal: Interest or engagement in activities will improve Outcome: Not Progressing

## 2023-08-01 NOTE — Group Note (Signed)
Date:  08/01/2023 Time:  8:10 PM  Group Topic/Focus:  Alcoholics Anonymous Meeting    Participation Level:  Active  Participation Quality:  Appropriate  Affect:  Appropriate  Cognitive:  Appropriate  Insight: Appropriate  Engagement in Group:  Engaged  Modes of Intervention:  Discussion and Support  Additional Comments:  Patient attended AA.  Kennieth Francois 08/01/2023, 8:10 PM

## 2023-08-01 NOTE — Progress Notes (Signed)
Patient presents with labile mood and stated he slept well. Patient denies SI,HI, and A/V/H with no plan or intent but states his depression is a 5 out of 10 and anxiety a 7 out of 10. Patient upset due to not being able to discharge today but is med compliant and overall cooperative. Patient states his goal is to "get in a shelter" and "try better and harder to get where I need to be." Patient asked RN to look at his foot due to skin peeling. Tough thick skin observed on bottom of R heel. Area cleansed and covered with bandaged per patient's request. No sign of bleeding or infection. New skin growth visible under tough skin. Patient in no current distress.
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
## 2023-08-01 NOTE — Group Note (Signed)
Recreation Therapy Group Note   Group Topic:Healthy Decision Making  Group Date: 08/01/2023 Start Time: 0935 End Time: 1010 Facilitators: Demontay Grantham-McCall, LRT,CTRS Location: [address] Dayroom   Group Topic: Decision Making, Problem Solving, Communication  Goal Area(s) Addresses:  Patient will effectively work with peer towards shared goal.  Patient will identify factors that guided their decision making.  Patient will pro-socially communicate ideas during group session.   Intervention: Survival Scenario - pencil, paper  Group Description: Patients were given a scenario that they were going to be stranded on a deserted [place] for several months before being rescued. Writer tasked them with making a list of 15 things they would choose to bring with them for "survival". The list of items was prioritized most important to least. Each patient would come up with their own list, then work together to create a new list of 15 items while in a group of 3-5 peers. LRT discussed each person's list and how it differed from others. The debrief included discussion of priorities, good decisions versus bad decisions, and how it is important to think before acting so we can make the best decision possible. LRT tied the concept of effective communication among group members to patient's support systems outside of the hospital and its benefit post discharge.  Education: Pharmacist, community, Priorities, Support System, Discharge Planning   Education Outcome: Acknowledges education/In group clarification/Needs additional education   Affect/Mood: Sleepy   Participation Level: None   Participation Quality: None   Behavior: Sedated   Speech/Thought Process: None   Insight: None   Judgement: None   Modes of Intervention: Activity   Patient Response to Interventions:  Resistant    Education Outcome:  In group clarification offered    Clinical Observations/Individualized Feedback: Pt came in late  to group. Pt was drowsy and appeared sedated. Pt was unable to stay awake and eventually fell asleep in group.     Plan: Continue to engage patient in RT group sessions 2-3x/week.   Yamir Carignan-McCall, LRT,CTRS  08/01/2023 12:27 PM

## 2023-08-01 NOTE — Progress Notes (Signed)
   08/01/23 2045  Psych Admission Type (Psych Patients Only)  Admission Status Voluntary  Psychosocial Assessment  Patient Complaints None  Eye Contact Fair  Facial Expression Flat  Affect Appropriate to circumstance  Speech Logical/coherent  Interaction Assertive  Motor Activity Slow  Appearance/Hygiene Improved  Behavior Characteristics Cooperative  Mood Pleasant  Aggressive Behavior  Effect No apparent injury  Thought Process  Coherency WDL  Content WDL  Delusions WDL  Perception WDL  Hallucination None reported or observed  Judgment Poor  Confusion None  Danger to Self  Current suicidal ideation? Denies  Danger to Others  Danger to Others None reported or observed

## 2023-08-01 NOTE — Progress Notes (Signed)
Pt stated he did not have a ride back to [facility] and would like for [place] to help him get back to [place].

## 2023-08-01 NOTE — Group Note (Signed)
Date:  08/01/2023 Time:  11:31 AM  Group Topic/Focus:  Goals Group:   The focus of this group is to help patients establish daily goals to achieve during treatment and discuss how the patient can incorporate goal setting into their daily lives to aide in recovery.    Participation Level:  Active  Participation Quality:  Appropriate  Affect:  Appropriate  Cognitive:  Appropriate  Insight: Appropriate  Engagement in Group:  Engaged  Modes of Intervention:  Discussion  Additional Comments:    Beckie Busing 08/01/2023, 11:31 AM

## 2023-08-01 NOTE — [facility] Group Notes (Signed)

## 2023-08-02 DIAGNOSIS — F250 Schizoaffective disorder, bipolar type: Secondary | ICD-10-CM | POA: Diagnosis not present

## 2023-08-02 MED ORDER — GABAPENTIN 400 MG PO CAPS: 800.0000 mg | ORAL_CAPSULE | Freq: Three times a day (TID) | ORAL | 0 refills | Status: DC

## 2023-08-02 MED ORDER — RISPERIDONE 1 MG PO TABS
1.0000 mg | ORAL_TABLET | Freq: Two times a day (BID) | ORAL | Status: DC
  Filled 2023-08-02: qty 1

## 2023-08-02 MED ORDER — NARCAN 4 MG/0.1ML NA LIQD: 1.0000 | Freq: Once | NASAL | 0 refills | Status: AC

## 2023-08-02 MED ORDER — ADULT MULTIVITAMIN W/MINERALS CH: 1.0000 | ORAL_TABLET | Freq: Every day | ORAL | Status: DC

## 2023-08-02 MED ORDER — NICOTINE POLACRILEX 2 MG MT GUM: 2.0000 mg | CHEWING_GUM | OROMUCOSAL | 0 refills | Status: DC | PRN

## 2023-08-02 MED ORDER — NICOTINE 14 MG/24HR TD PT24: 14.0000 mg | MEDICATED_PATCH | Freq: Every day | TRANSDERMAL | 0 refills | Status: DC

## 2023-08-02 MED ORDER — DIVALPROEX SODIUM 500 MG PO DR TAB: 500.0000 mg | DELAYED_RELEASE_TABLET | Freq: Two times a day (BID) | ORAL | 0 refills | Status: DC

## 2023-08-02 MED ORDER — RISPERIDONE 1 MG PO TABS: 1.0000 mg | ORAL_TABLET | Freq: Two times a day (BID) | ORAL | 0 refills | Status: DC

## 2023-08-02 MED ORDER — TRAZODONE HCL 100 MG PO TABS: 100.0000 mg | ORAL_TABLET | Freq: Every day | ORAL | 0 refills | Status: DC
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
## 2023-08-02 NOTE — Discharge Instructions (Addendum)
-  Follow-up with your outpatient psychiatric provider -instructions on appointment date, time, and address (location) are provided to you in discharge paperwork.  -Take your psychiatric medications as prescribed at discharge - instructions are provided to you in the discharge paperwork As we discussed, we will provide printed prescriptions at discharge - take these to the pharmacy of your choosing to have your medications filled.   -Follow-up with outpatient primary care doctor and other specialists -for management of preventative medicine and any chronic medical disease.  -Recommend abstinence from alcohol, tobacco, and other illicit drug use at discharge.   -If your psychiatric symptoms recur, worsen, or if you have side effects to your psychiatric medications, call your outpatient psychiatric provider, 911, 988 or go to the nearest emergency department.  -If suicidal thoughts occur, call your outpatient psychiatric provider, 911, 988 or go to the nearest emergency department.  Naloxone (Narcan) can help reverse an overdose when given to the victim quickly.  [facility] offers free naloxone kits and instructions/training on its use.  Add naloxone to your first aid kit and you can help save a life.   Pick up your free kit at the following locations:   [place]:  [facility], [address] [place] [address] [phone]) [facility] [address] [place] [address] [phone]) [facility] [address] [place] [address]  [place]: [facility] [address] [place] [address] ([phone]) [facility] [address] [place] [address] [phone])

## 2023-08-02 NOTE — Progress Notes (Addendum)
  [facility] Adult Case Management Discharge Plan :  Will you be returning to the same living situation after discharge:  Yes,  Patient is returning home with his mother on today  At discharge, do you have transportation home?: Yes,  Safe transport will transport the patient back to [facility] where patient reports his transportation is.  At 11:15AM.  Do you have the ability to pay for your medications: Yes,  Patient has insurance   Release of information consent forms completed and in the chart;  Patient's signature needed at discharge.  Patient to Follow up at:  Follow-up Information     [facility] Follow up on 08/05/2023.   Why: * Please cancel if you do not need this appt:  You have a hospital follow up appointment for medication management services on 08/05/23 at 2:30 pm m.  This will be a Sports administrator appt. Contact information: [address]  [address] [place] [address] [phone]         [facility]. Schedule an appointment as soon as possible for a visit.   Why: Please go online to schedule your next appointment for therapy services. Contact information: [address] [place], [place] [address]  Phone: [phone] Fax: [phone]        [facility]. Call.   Why: Please call this provider to schedule an appointment to establish care. Contact information: [address] [place], [place] [address]  Phone: [phone] Fax:  [phone]                Next level of care provider has access to [facility] Link:no  Safety Planning and Suicide Prevention discussed: Yes,  Mother on 08/01/2023     Has patient been referred to the Quitline?: Patient to be provided the Quitline resources prior to discharge.   Patient has been referred for addiction treatment: Patient refused referral for treatment.  Loleta Dicker, LCSW 08/02/2023, 9:42 AM

## 2023-08-02 NOTE — Progress Notes (Signed)
Pt discharged to lobby. Pt was stable and appreciative at that time. All papers, samples and prescriptions were given and valuables returned. Verbal understanding expressed. Denies SI/HI and A/VH. Pt given opportunity to express concerns and ask questions.  
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
## 2023-08-07 ENCOUNTER — Encounter (HOSPITAL_COMMUNITY): Payer: Self-pay

## 2023-08-07 ENCOUNTER — Emergency Department (HOSPITAL_COMMUNITY)
Admission: EM | Admit: 2023-08-07 | Discharge: 2023-08-07 | Disposition: A | Discharge status: Home / Self Care | Payer: Medicaid Other | Attending: Emergency Medicine | Admitting: Emergency Medicine

## 2023-08-07 ENCOUNTER — Other Ambulatory Visit: Payer: Self-pay

## 2023-08-07 DIAGNOSIS — Z765 Malingerer [conscious simulation]: Secondary | ICD-10-CM | POA: Diagnosis present

## 2023-08-07 LAB — CBC
HCT: 47.6 % (ref 39.0–52.0)
Hemoglobin: 15 g/dL (ref 13.0–17.0)
MCH: 25.4 pg — ABNORMAL LOW (ref 26.0–34.0)
MCHC: 31.5 g/dL (ref 30.0–36.0)
MCV: 80.7 fL (ref 80.0–100.0)
Platelets: 167 10*3/uL (ref 150–400)
RBC: 5.9 MIL/uL — ABNORMAL HIGH (ref 4.22–5.81)
RDW: 13.4 % (ref 11.5–15.5)
WBC: 9 10*3/uL (ref 4.0–10.5)
nRBC: 0 % (ref 0.0–0.2)

## 2023-08-07 LAB — COMPREHENSIVE METABOLIC PANEL
ALT: 27 U/L (ref 0–44)
AST: 21 U/L (ref 15–41)
Albumin: 4.5 g/dL (ref 3.5–5.0)
Alkaline Phosphatase: 47 U/L (ref 38–126)
Anion gap: 9 (ref 5–15)
BUN: 17 mg/dL (ref 6–20)
CO2: 29 mmol/L (ref 22–32)
Calcium: 9.4 mg/dL (ref 8.9–10.3)
Chloride: 99 mmol/L (ref 98–111)
Creatinine, Ser: 0.88 mg/dL (ref 0.61–1.24)
GFR, Estimated: >60 mL/min (ref >60)
Glucose, Bld: 69 mg/dL — ABNORMAL LOW (ref 70–99)
Potassium: 3.7 mmol/L (ref 3.5–5.1)
Sodium: 137 mmol/L (ref 135–145)
Total Bilirubin: 0.8 mg/dL (ref <1.2)
Total Protein: 8.1 g/dL (ref 6.5–8.1)

## 2023-08-07 LAB — ETHANOL: Alcohol, Ethyl (B): <10 mg/dL (ref <10)

## 2023-08-07 NOTE — ED Triage Notes (Signed)
Pt to ED denying suicide ideation at this time but endorsing homicidal ideation, not to a specific person. Pt vague with responses, stating " I don't know, I might be alright, I'm having difficulties with the people I live with " stating he wants to go to inpatient psych in [place]. Seen several times for same.

## 2023-08-07 NOTE — ED Notes (Signed)
Security called to wand pt- pt in burgundy scrubs at this time. Belonging placed in bag and taken to [facility] locker by Cyl[employer], NT Pt has been wanded by security

## 2023-08-07 NOTE — ED Provider Notes (Signed)
[place] EMERGENCY DEPARTMENT AT [facility]  Provider Note  CSN: [id number] Arrival date & time: 08/07/23 0105  History Chief Complaint  Patient presents with   Medical Clearance    Cole [name] is a 42 y.o. male here requesting to speak to a psychiatrist. He refuses to give me any further history and says 'it's in the computer'. Triage note indicates he denies SI and had vague nonspecific HI. I advised the patient that without any further information, I could not justify consulting the psychiatry team. He again refused to elaborate.    Home Medications Prior to Admission medications   Medication Sig Start Date End Date Taking? Authorizing Provider  divalproex (DEPAKOTE) 500 MG DR tablet Take 1 tablet (500 mg total) by mouth every 12 (twelve) hours. 08/02/23   Massengill, Harrold Donath, MD  gabapentin (NEURONTIN) 400 MG capsule Take 2 capsules (800 mg total) by mouth 3 (three) times daily. 08/02/23 09/01/23  Massengill, Harrold Donath, MD  Multiple Vitamin (MULTIVITAMIN WITH MINERALS) TABS tablet Take 1 tablet by mouth daily. 08/03/23   Massengill, Harrold Donath, MD  nicotine (NICODERM CQ - DOSED IN MG/24 HOURS) 14 mg/24hr patch Place 1 patch (14 mg total) onto the skin daily. 08/03/23   Massengill, Harrold Donath, MD  nicotine polacrilex (NICORETTE) 2 MG gum Take 1 each (2 mg total) by mouth as needed for smoking cessation. 08/02/23   Massengill, Harrold Donath, MD  risperiDONE (RISPERDAL) 1 MG tablet Take 1 tablet (1 mg total) by mouth 2 (two) times daily. 08/03/23 09/02/23  Massengill, Harrold Donath, MD  SUBOXONE 8-2 MG FILM Place 1 Film under the tongue 2 (two) times daily. 07/20/23   [provider]  traZODone (DESYREL) 100 MG tablet Take 1 tablet (100 mg total) by mouth at bedtime. 08/02/23 09/01/23  Massengill, Harrold Donath, MD     Allergies    Aleve [naproxen sodium], Aspirin, Ibuprofen, Seroquel [quetiapine fumerate], Tramadol, and Tylenol [acetaminophen]   Review of Systems   Review of Systems Please see  HPI for pertinent positives and negatives  Physical Exam BP (!) 144/95 (BP Location: Right Arm)   Pulse 87   Temp 98.9 F (37.2 C) (Oral)   Resp 19   Ht 5\' 9"  (1.753 m)   Wt 83.5 kg   SpO2 99%   BMI 27.17 kg/m   Physical Exam Vitals and nursing note reviewed.  HENT:     Head: Normocephalic.     Nose: Nose normal.  Eyes:     Extraocular Movements: Extraocular movements intact.  Pulmonary:     Effort: Pulmonary effort is normal.  Musculoskeletal:        General: Normal range of motion.     Cervical back: Neck supple.  Skin:    Findings: No rash (on exposed skin).  Neurological:     Mental Status: He is alert and oriented to person, place, and time.  Psychiatric:        Mood and Affect: Mood normal.     Comments: Calm, does not cooperate with history     ED Results / Procedures / Treatments   EKG None  Procedures Procedures  Medications Ordered in the ED Medications - No data to display  Initial Impression and Plan  Patient here requesting to talk to Psychiatry but does not elaborate to me. Indicated in triage he was not suicidal and wanted to go to a hospital in [place].He was discharged from the [facility] less than a week ago. He does not have any indication for inpatient psychiatric  admission at this time. Labs done in triage show normal CBC, CMP and EtOH. He has no emergent medical condition. Cleared for discharge. Encouraged to follow up with outpatient psychiatry resources, RTED for any other concerns.   ED Course       MDM Rules/Calculators/A&P Medical Decision Making Problems Addressed: Malingering: self-limited or minor problem  Amount and/or Complexity of Data Reviewed Labs: ordered. Decision-making details documented in ED Course.     Final Clinical Impression(s) / ED Diagnoses Final diagnoses:  Malingering    Rx / DC Orders ED Discharge Orders     None        Pollyann Savoy, MD 08/07/23 [phone]

## 2023-08-09 ENCOUNTER — Emergency Department (HOSPITAL_COMMUNITY)
Admission: EM | Admit: 2023-08-09 | Discharge: 2023-08-09 | Discharge status: Against Medical Advice | Payer: Medicaid Other | Attending: Emergency Medicine | Admitting: Emergency Medicine

## 2023-08-09 ENCOUNTER — Encounter (HOSPITAL_COMMUNITY): Payer: Self-pay | Admitting: *Deleted

## 2023-08-09 ENCOUNTER — Other Ambulatory Visit: Payer: Self-pay

## 2023-08-09 DIAGNOSIS — M79671 Pain in right foot: Secondary | ICD-10-CM | POA: Diagnosis not present

## 2023-08-09 DIAGNOSIS — M79672 Pain in left foot: Secondary | ICD-10-CM | POA: Diagnosis present

## 2023-08-09 DIAGNOSIS — Z5321 Procedure and treatment not carried out due to patient leaving prior to being seen by health care provider: Secondary | ICD-10-CM | POA: Insufficient documentation

## 2023-08-09 NOTE — ED Notes (Signed)
2nd call, not in lobby.

## 2023-08-09 NOTE — ED Notes (Signed)
Called, not in lobby 

## 2023-08-09 NOTE — ED Notes (Signed)
3rd call. Pt not in lobby

## 2023-08-09 NOTE — ED Triage Notes (Signed)
Pt c/o bilateral foot pain and states he has bumps to both feet

## 2023-08-22 ENCOUNTER — Encounter (HOSPITAL_COMMUNITY): Payer: Self-pay

## 2023-08-22 ENCOUNTER — Other Ambulatory Visit: Payer: Self-pay

## 2023-08-22 ENCOUNTER — Emergency Department (HOSPITAL_COMMUNITY)
Admission: EM | Admit: 2023-08-22 | Discharge: 2023-08-22 | Disposition: A | Discharge status: Home / Self Care | Payer: Medicaid Other | Attending: Emergency Medicine | Admitting: Emergency Medicine

## 2023-08-22 DIAGNOSIS — K0889 Other specified disorders of teeth and supporting structures: Secondary | ICD-10-CM | POA: Insufficient documentation

## 2023-08-22 MED ORDER — PENICILLIN V POTASSIUM 250 MG PO TABS
500.0000 mg | ORAL_TABLET | Freq: Once | ORAL | Status: AC
  Administered 2023-08-22: 500 mg via ORAL
  Filled 2023-08-22: qty 2

## 2023-08-22 MED ORDER — PENICILLIN V POTASSIUM 500 MG PO TABS: 500.0000 mg | ORAL_TABLET | Freq: Four times a day (QID) | ORAL | 0 refills | Status: AC

## 2023-08-22 NOTE — ED Provider Notes (Signed)
[place] EMERGENCY DEPARTMENT AT [facility] Provider Note   CSN: [id number] Arrival date & time: 08/22/23  0146     History  Chief Complaint  Patient presents with   Dental Pain    Cole [name] is a 42 y.o. male.  Presents with dental pain and gum swelling for a couple of days.  Left upper side of mouth.       Home Medications Prior to Admission medications   Medication Sig Start Date End Date Taking? Authorizing Provider  penicillin v potassium (VEETID) 500 MG tablet Take 1 tablet (500 mg total) by mouth 4 (four) times daily for 10 days. 08/22/23 09/01/23 Yes Bernette Seeman, Canary Brim, MD  divalproex (DEPAKOTE) 500 MG DR tablet Take 1 tablet (500 mg total) by mouth every 12 (twelve) hours. 08/02/23   Massengill, Harrold Donath, MD  gabapentin (NEURONTIN) 400 MG capsule Take 2 capsules (800 mg total) by mouth 3 (three) times daily. 08/02/23 09/01/23  Massengill, Harrold Donath, MD  Multiple Vitamin (MULTIVITAMIN WITH MINERALS) TABS tablet Take 1 tablet by mouth daily. 08/03/23   Massengill, Harrold Donath, MD  nicotine (NICODERM CQ - DOSED IN MG/24 HOURS) 14 mg/24hr patch Place 1 patch (14 mg total) onto the skin daily. 08/03/23   Massengill, Harrold Donath, MD  nicotine polacrilex (NICORETTE) 2 MG gum Take 1 each (2 mg total) by mouth as needed for smoking cessation. 08/02/23   Massengill, Harrold Donath, MD  risperiDONE (RISPERDAL) 1 MG tablet Take 1 tablet (1 mg total) by mouth 2 (two) times daily. 08/03/23 09/02/23  Massengill, Harrold Donath, MD  SUBOXONE 8-2 MG FILM Place 1 Film under the tongue 2 (two) times daily. 07/20/23   [provider]  traZODone (DESYREL) 100 MG tablet Take 1 tablet (100 mg total) by mouth at bedtime. 08/02/23 09/01/23  Massengill, Harrold Donath, MD      Allergies    Aleve [naproxen sodium], Aspirin, Ibuprofen, Seroquel [quetiapine fumerate], Tramadol, and Tylenol [acetaminophen]    Review of Systems   Review of Systems  Physical Exam Updated Vital Signs BP 137/85 (BP Location: Left  Arm)   Pulse 74   Temp 97.7 F (36.5 C) (Oral)   Resp 16   Ht 5\' 9"  (1.753 m)   Wt 83.4 kg   SpO2 98%   BMI 27.15 kg/m  Physical Exam Vitals and nursing note reviewed.  Constitutional:      General: He is not in acute distress.    Appearance: He is well-developed.  HENT:     Head: Normocephalic and atraumatic.     Jaw: No swelling, pain on movement or malocclusion.     Mouth/Throat:     Mouth: Mucous membranes are moist.     Dentition: Abnormal dentition. Gingival swelling present.  Eyes:     General: Vision grossly intact. Gaze aligned appropriately.     Extraocular Movements: Extraocular movements intact.     Conjunctiva/sclera: Conjunctivae normal.  Cardiovascular:     Rate and Rhythm: Normal rate and regular rhythm.     Pulses: Normal pulses.     Heart sounds: Normal heart sounds, S1 normal and S2 normal. No murmur heard.    No friction rub. No gallop.  Pulmonary:     Effort: Pulmonary effort is normal. No respiratory distress.     Breath sounds: Normal breath sounds.  Abdominal:     Palpations: Abdomen is soft.     Tenderness: There is no abdominal tenderness. There is no guarding or rebound.     Hernia: No hernia is present.  Musculoskeletal:        General: No swelling.     Cervical back: Full passive range of motion without pain, normal range of motion and neck supple. No pain with movement, spinous process tenderness or muscular tenderness. Normal range of motion.     Right lower leg: No edema.     Left lower leg: No edema.  Skin:    General: Skin is warm and dry.     Capillary Refill: Capillary refill takes less than 2 seconds.     Findings: No ecchymosis, erythema, lesion or wound.  Neurological:     Mental Status: He is alert and oriented to person, place, and time.     GCS: GCS eye subscore is 4. GCS verbal subscore is 5. GCS motor subscore is 6.     Cranial Nerves: Cranial nerves 2-12 are intact.     Sensory: Sensation is intact.     Motor: Motor  function is intact. No weakness or abnormal muscle tone.     Coordination: Coordination is intact.  Psychiatric:        Mood and Affect: Mood normal.        Speech: Speech normal.        Behavior: Behavior normal.     ED Results / Procedures / Treatments   Labs (all labs ordered are listed, but only abnormal results are displayed) Labs Reviewed - No data to display  EKG None  Radiology No results found.  Procedures Procedures    Medications Ordered in ED Medications  penicillin v potassium (VEETID) tablet 500 mg (has no administration in time range)    ED Course/ Medical Decision Making/ A&P                                 Medical Decision Making  Dental pain for a couple of days.  No obvious abscess.  Patient with poor dentition.        Final Clinical Impression(s) / ED Diagnoses Final diagnoses:  Tooth pain    Rx / DC Orders ED Discharge Orders          Ordered    penicillin v potassium (VEETID) 500 MG tablet  4 times daily        08/22/23 0215              Gilda Crease, MD 08/22/23 [phone]

## 2023-08-22 NOTE — ED Triage Notes (Signed)
Pt arrived via POV c/o Left Upper Dental pain X a few days. Pt reports he has not reached out to an Orthodontist or Dentist and has not tried any OTC for pain releif.

## 2023-09-12 ENCOUNTER — Encounter (HOSPITAL_COMMUNITY): Payer: Self-pay

## 2023-09-12 ENCOUNTER — Emergency Department (HOSPITAL_COMMUNITY)
Admission: EM | Admit: 2023-09-12 | Discharge: 2023-09-13 | Disposition: A | Discharge status: Home / Self Care | Payer: Medicaid Other | Attending: Emergency Medicine | Admitting: Emergency Medicine

## 2023-09-12 ENCOUNTER — Other Ambulatory Visit: Payer: Self-pay

## 2023-09-12 ENCOUNTER — Emergency Department (HOSPITAL_COMMUNITY): Payer: Medicaid Other

## 2023-09-12 DIAGNOSIS — J45909 Unspecified asthma, uncomplicated: Secondary | ICD-10-CM | POA: Insufficient documentation

## 2023-09-12 DIAGNOSIS — F17210 Nicotine dependence, cigarettes, uncomplicated: Secondary | ICD-10-CM | POA: Insufficient documentation

## 2023-09-12 DIAGNOSIS — M25562 Pain in left knee: Secondary | ICD-10-CM | POA: Diagnosis present

## 2023-09-12 NOTE — ED Triage Notes (Signed)
 Pt via [facility] c/o left knee pain. Fell walking through woods.
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
## 2023-09-13 NOTE — Discharge Instructions (Signed)
 You were evaluated in the Emergency Department and after careful evaluation, we did not find any emergent condition requiring admission or further testing in the hospital.  Your exam/testing today is overall reassuring.  Symptoms might be due to a sprain of the knee.  Your x-ray did not show any broken bones.  Recommend Tylenol  1000 mg every 4-6 hours for pain.  Can use the crutches as needed.  Please return to the Emergency Department if you experience any worsening of your condition.   Thank you for allowing us  to be a part of your care.

## 2023-10-02 ENCOUNTER — Other Ambulatory Visit: Payer: Self-pay

## 2023-10-02 ENCOUNTER — Other Ambulatory Visit (HOSPITAL_COMMUNITY)
Admission: EM | Admit: 2023-10-02 | Discharge: 2023-10-04 | Disposition: A | Discharge status: Home / Self Care | Payer: Medicaid Other | Attending: Psychiatry | Admitting: Psychiatry

## 2023-10-02 ENCOUNTER — Encounter (HOSPITAL_COMMUNITY): Payer: Self-pay | Admitting: Emergency Medicine

## 2023-10-02 ENCOUNTER — Emergency Department (HOSPITAL_COMMUNITY)
Admission: EM | Admit: 2023-10-02 | Discharge: 2023-10-02 | Disposition: A | Discharge status: Other Acute Inpatient Hospital | Payer: Medicaid Other | Attending: Emergency Medicine | Admitting: Emergency Medicine

## 2023-10-02 DIAGNOSIS — J45909 Unspecified asthma, uncomplicated: Secondary | ICD-10-CM | POA: Diagnosis not present

## 2023-10-02 DIAGNOSIS — R45851 Suicidal ideations: Secondary | ICD-10-CM | POA: Insufficient documentation

## 2023-10-02 DIAGNOSIS — F1910 Other psychoactive substance abuse, uncomplicated: Secondary | ICD-10-CM | POA: Insufficient documentation

## 2023-10-02 DIAGNOSIS — F1914 Other psychoactive substance abuse with psychoactive substance-induced mood disorder: Secondary | ICD-10-CM | POA: Insufficient documentation

## 2023-10-02 DIAGNOSIS — Z5900 Homelessness unspecified: Secondary | ICD-10-CM | POA: Insufficient documentation

## 2023-10-02 DIAGNOSIS — F1994 Other psychoactive substance use, unspecified with psychoactive substance-induced mood disorder: Secondary | ICD-10-CM | POA: Diagnosis present

## 2023-10-02 DIAGNOSIS — F1191 Opioid use, unspecified, in remission: Secondary | ICD-10-CM

## 2023-10-02 DIAGNOSIS — F3132 Bipolar disorder, current episode depressed, moderate: Secondary | ICD-10-CM | POA: Insufficient documentation

## 2023-10-02 DIAGNOSIS — F1111 Opioid abuse, in remission: Secondary | ICD-10-CM | POA: Insufficient documentation

## 2023-10-02 DIAGNOSIS — F17210 Nicotine dependence, cigarettes, uncomplicated: Secondary | ICD-10-CM | POA: Insufficient documentation

## 2023-10-02 LAB — COMPREHENSIVE METABOLIC PANEL
ALT: 24 U/L (ref 0–44)
AST: 34 U/L (ref 15–41)
Albumin: 3.7 g/dL (ref 3.5–5.0)
Alkaline Phosphatase: 46 U/L (ref 38–126)
Anion gap: 7 (ref 5–15)
BUN: 24 mg/dL — ABNORMAL HIGH (ref 6–20)
CO2: 28 mmol/L (ref 22–32)
Calcium: 8.7 mg/dL — ABNORMAL LOW (ref 8.9–10.3)
Chloride: 103 mmol/L (ref 98–111)
Creatinine, Ser: 0.82 mg/dL (ref 0.61–1.24)
GFR, Estimated: >60 mL/min (ref >60)
Glucose, Bld: 82 mg/dL (ref 70–99)
Potassium: 3.7 mmol/L (ref 3.5–5.1)
Sodium: 138 mmol/L (ref 135–145)
Total Bilirubin: 0.8 mg/dL (ref 0.0–1.2)
Total Protein: 7 g/dL (ref 6.5–8.1)

## 2023-10-02 LAB — RAPID URINE DRUG SCREEN, HOSP PERFORMED
Amphetamines: NOT DETECTED
Barbiturates: NOT DETECTED
Benzodiazepines: NOT DETECTED
Cocaine: POSITIVE — AB
Opiates: NOT DETECTED
Tetrahydrocannabinol: POSITIVE — AB

## 2023-10-02 LAB — CBC
HCT: 39.2 % (ref 39.0–52.0)
Hemoglobin: 12.8 g/dL — ABNORMAL LOW (ref 13.0–17.0)
MCH: 26 pg (ref 26.0–34.0)
MCHC: 32.7 g/dL (ref 30.0–36.0)
MCV: 79.7 fL — ABNORMAL LOW (ref 80.0–100.0)
Platelets: 162 10*3/uL (ref 150–400)
RBC: 4.92 MIL/uL (ref 4.22–5.81)
RDW: 13.5 % (ref 11.5–15.5)
WBC: 4.8 10*3/uL (ref 4.0–10.5)
nRBC: 0 % (ref 0.0–0.2)

## 2023-10-02 LAB — SALICYLATE LEVEL: Salicylate Lvl: <7 mg/dL — ABNORMAL LOW (ref 7.0–30.0)

## 2023-10-02 LAB — ACETAMINOPHEN LEVEL: Acetaminophen (Tylenol), Serum: <10 ug/mL — ABNORMAL LOW (ref 10–30)

## 2023-10-02 LAB — ETHANOL: Alcohol, Ethyl (B): <10 mg/dL (ref <10)

## 2023-10-02 MED ORDER — DIVALPROEX SODIUM 500 MG PO DR TAB
500.0000 mg | DELAYED_RELEASE_TABLET | Freq: Two times a day (BID) | ORAL | Status: DC
  Administered 2023-10-02 – 2023-10-04 (×4): 500 mg via ORAL
  Filled 2023-10-02 (×4): qty 1

## 2023-10-02 MED ORDER — LORAZEPAM 1 MG PO TABS: 1.0000 mg | ORAL_TABLET | Freq: Four times a day (QID) | ORAL | Status: DC | PRN

## 2023-10-02 MED ORDER — ADULT MULTIVITAMIN W/MINERALS CH
1.0000 | ORAL_TABLET | Freq: Every day | ORAL | Status: DC
  Administered 2023-10-02 – 2023-10-04 (×3): 1 via ORAL
  Filled 2023-10-02 (×3): qty 1

## 2023-10-02 MED ORDER — GABAPENTIN 100 MG PO CAPS
200.0000 mg | ORAL_CAPSULE | Freq: Three times a day (TID) | ORAL | Status: DC
Start: 2023-10-02 — End: 2023-10-04
  Administered 2023-10-02 – 2023-10-04 (×5): 200 mg via ORAL
  Filled 2023-10-02 (×5): qty 2

## 2023-10-02 MED ORDER — HALOPERIDOL LACTATE 5 MG/ML IJ SOLN: 10.0000 mg | Freq: Four times a day (QID) | INTRAMUSCULAR | Status: DC | PRN

## 2023-10-02 MED ORDER — HALOPERIDOL 5 MG PO TABS: 5.0000 mg | ORAL_TABLET | Freq: Four times a day (QID) | ORAL | Status: DC | PRN

## 2023-10-02 MED ORDER — RISPERIDONE 1 MG PO TABS
1.0000 mg | ORAL_TABLET | Freq: Two times a day (BID) | ORAL | Status: DC
  Administered 2023-10-03 – 2023-10-04 (×3): 1 mg via ORAL
  Filled 2023-10-02 (×4): qty 1

## 2023-10-02 MED ORDER — ALUM & MAG HYDROXIDE-SIMETH 200-200-20 MG/5ML PO SUSP: 30.0000 mL | ORAL | Status: DC | PRN

## 2023-10-02 MED ORDER — ONDANSETRON 4 MG PO TBDP: 4.0000 mg | ORAL_TABLET | Freq: Four times a day (QID) | ORAL | Status: DC | PRN

## 2023-10-02 MED ORDER — NICOTINE 21 MG/24HR TD PT24: 21.0000 mg | MEDICATED_PATCH | Freq: Once | TRANSDERMAL | Status: DC | PRN

## 2023-10-02 MED ORDER — RISPERIDONE 1 MG PO TABS
1.0000 mg | ORAL_TABLET | Freq: Two times a day (BID) | ORAL | Status: DC
  Administered 2023-10-02: 1 mg via ORAL
  Filled 2023-10-02: qty 1

## 2023-10-02 MED ORDER — LOPERAMIDE HCL 2 MG PO CAPS: 2.0000 mg | ORAL_CAPSULE | ORAL | Status: DC | PRN

## 2023-10-02 MED ORDER — LORAZEPAM 2 MG/ML IJ SOLN: 2.0000 mg | Freq: Four times a day (QID) | INTRAMUSCULAR | Status: DC | PRN

## 2023-10-02 MED ORDER — MAGNESIUM HYDROXIDE 400 MG/5ML PO SUSP: 30.0000 mL | Freq: Every day | ORAL | Status: DC | PRN

## 2023-10-02 MED ORDER — TRAZODONE HCL 50 MG PO TABS
100.0000 mg | ORAL_TABLET | Freq: Every day | ORAL | Status: DC
Start: 2023-10-02 — End: 2023-10-02

## 2023-10-02 MED ORDER — BUPRENORPHINE HCL-NALOXONE HCL 8-2 MG SL SUBL
1.0000 | SUBLINGUAL_TABLET | Freq: Once | SUBLINGUAL | Status: AC
  Administered 2023-10-02: 1 via SUBLINGUAL
  Filled 2023-10-02: qty 1

## 2023-10-02 MED ORDER — DIVALPROEX SODIUM 250 MG PO DR TAB
500.0000 mg | DELAYED_RELEASE_TABLET | Freq: Two times a day (BID) | ORAL | Status: DC
Start: 2023-10-02 — End: 2023-10-02
  Administered 2023-10-02: 500 mg via ORAL
  Filled 2023-10-02: qty 2

## 2023-10-02 MED ORDER — THIAMINE MONONITRATE 100 MG PO TABS
100.0000 mg | ORAL_TABLET | Freq: Every day | ORAL | Status: DC
  Administered 2023-10-03 – 2023-10-04 (×2): 100 mg via ORAL
  Filled 2023-10-02 (×2): qty 1

## 2023-10-02 MED ORDER — GABAPENTIN 100 MG PO CAPS
200.0000 mg | ORAL_CAPSULE | Freq: Three times a day (TID) | ORAL | Status: DC
Start: 2023-10-02 — End: 2023-10-02
  Administered 2023-10-02: 200 mg via ORAL
  Filled 2023-10-02: qty 2

## 2023-10-02 MED ORDER — HYDROXYZINE HCL 25 MG PO TABS: 25.0000 mg | ORAL_TABLET | Freq: Four times a day (QID) | ORAL | Status: DC | PRN

## 2023-10-02 MED ORDER — TRAZODONE HCL 100 MG PO TABS
100.0000 mg | ORAL_TABLET | Freq: Every day | ORAL | Status: DC
  Administered 2023-10-02 – 2023-10-03 (×2): 100 mg via ORAL
  Filled 2023-10-02 (×2): qty 1

## 2023-10-02 NOTE — ED Provider Notes (Signed)
[place] EMERGENCY DEPARTMENT AT [facility] Provider Note   CSN: [id number] Arrival date & time: 10/02/23  7829     History  Chief Complaint  Patient presents with   V70.1    Cole [name] is a 43 y.o. male.  HPI Patient presents for suicidal ideation.  Medical history includes polysubstance abuse, homelessness, schizoaffective disorder, depression, anxiety, chronic pain, asthma.  Recently, he has been using cocaine, marijuana, and alcohol throughout the week.  Last use was yesterday.  He endorses suicidal ideation with plan to cut his wrists.  Patient denies any new areas of pain.  He does states that he does have chronic knee pain.  He he has not been eating much.  He denies abdominal pain or nausea.    Home Medications Prior to Admission medications   Medication Sig Start Date End Date Taking? Authorizing Provider  divalproex (DEPAKOTE) 500 MG DR tablet Take 1 tablet (500 mg total) by mouth every 12 (twelve) hours. 08/02/23   Massengill, Harrold Donath, MD  gabapentin (NEURONTIN) 400 MG capsule Take 2 capsules (800 mg total) by mouth 3 (three) times daily. 08/02/23 09/01/23  Massengill, Harrold Donath, MD  Multiple Vitamin (MULTIVITAMIN WITH MINERALS) TABS tablet Take 1 tablet by mouth daily. 08/03/23   Massengill, Harrold Donath, MD  nicotine (NICODERM CQ - DOSED IN MG/24 HOURS) 14 mg/24hr patch Place 1 patch (14 mg total) onto the skin daily. 08/03/23   Massengill, Harrold Donath, MD  nicotine polacrilex (NICORETTE) 2 MG gum Take 1 each (2 mg total) by mouth as needed for smoking cessation. 08/02/23   Massengill, Harrold Donath, MD  risperiDONE (RISPERDAL) 1 MG tablet Take 1 tablet (1 mg total) by mouth 2 (two) times daily. 08/03/23 09/02/23  Massengill, Harrold Donath, MD  SUBOXONE 8-2 MG FILM Place 1 Film under the tongue 2 (two) times daily. 07/20/23   [provider]  traZODone (DESYREL) 100 MG tablet Take 1 tablet (100 mg total) by mouth at bedtime. 08/02/23 09/01/23  Massengill, Harrold Donath, MD      Allergies     Aleve [naproxen sodium], Aspirin, Ibuprofen, Seroquel [quetiapine fumerate], Tramadol, and Tylenol [acetaminophen]    Review of Systems   Review of Systems  Psychiatric/Behavioral:  Positive for suicidal ideas.   All other systems reviewed and are negative.   Physical Exam Updated Vital Signs BP 133/87 (BP Location: Right Arm)   Pulse 61   Temp 98.1 F (36.7 C) (Oral)   Resp 16   Ht 5\' 9"  (1.753 m)   Wt 83 kg   SpO2 100%   BMI 27.02 kg/m  Physical Exam Vitals and nursing note reviewed.  Constitutional:      General: He is not in acute distress.    Appearance: Normal appearance. He is well-developed. He is not ill-appearing, toxic-appearing or diaphoretic.  HENT:     Head: Normocephalic and atraumatic.     Right Ear: External ear normal.     Left Ear: External ear normal.     Nose: Nose normal.     Mouth/Throat:     Mouth: Mucous membranes are moist.  Eyes:     Extraocular Movements: Extraocular movements intact.     Conjunctiva/sclera: Conjunctivae normal.  Cardiovascular:     Rate and Rhythm: Normal rate and regular rhythm.  Pulmonary:     Effort: Pulmonary effort is normal. No respiratory distress.  Abdominal:     General: There is no distension.     Palpations: Abdomen is soft.  Musculoskeletal:  General: No swelling or deformity. Normal range of motion.     Cervical back: Normal range of motion and neck supple.  Skin:    General: Skin is warm and dry.     Coloration: Skin is not jaundiced or pale.  Neurological:     General: No focal deficit present.     Mental Status: He is alert and oriented to person, place, and time.  Psychiatric:        Mood and Affect: Mood and affect normal.        Speech: Speech normal.        Behavior: Behavior normal. Behavior is cooperative.        Thought Content: Thought content includes suicidal ideation. Thought content includes suicidal plan.     ED Results / Procedures / Treatments   Labs (all labs ordered are  listed, but only abnormal results are displayed) Labs Reviewed  COMPREHENSIVE METABOLIC PANEL  ETHANOL  SALICYLATE LEVEL  ACETAMINOPHEN LEVEL  CBC  RAPID URINE DRUG SCREEN, HOSP PERFORMED    EKG None  Radiology No results found.  Procedures Procedures    Medications Ordered in ED Medications  divalproex (DEPAKOTE) DR tablet 500 mg (has no administration in time range)  gabapentin (NEURONTIN) capsule 200 mg (has no administration in time range)  risperiDONE (RISPERDAL) tablet 1 mg (has no administration in time range)  traZODone (DESYREL) tablet 100 mg (has no administration in time range)  nicotine (NICODERM CQ - dosed in mg/24 hours) patch 21 mg (has no administration in time range)    ED Course/ Medical Decision Making/ A&P                                 Medical Decision Making Amount and/or Complexity of Data Reviewed Labs: ordered.  Risk OTC drugs. Prescription drug management.   Patient presenting for suicidal ideation with plan.  This is in the setting of recent drug and alcohol use.  On arrival in the ED, he is well-appearing.  Vital signs are normal.  He denies any new physical symptoms.  Workup was initiated.  Medical clearance lab work was pending at time of signout.  Care of patient was signed out to oncoming ED provider..        Final Clinical Impression(s) / ED Diagnoses Final diagnoses:  Suicidal ideation    Rx / DC Orders ED Discharge Orders     None         Gloris Manchester, MD 10/02/23 [phone]

## 2023-10-02 NOTE — Group Note (Signed)
Group Topic: Social Support  Group Date: 10/02/2023 Start Time: 1600 End Time: 1700 Facilitators: Mayer Camel L, RN  Department: [facility]  Number of Participants: 4  Group Focus: anxiety, coping skills, and social skills Treatment Modality:  Psychoeducation Interventions utilized were group exercise, mental fitness, patient education, and support Purpose: enhance coping skills, express feelings, improve communication skills, and increase insight  Name: Cole [name] Date of Birth: September 27, 1980  MR: [id number]    Level of Participation: patient excused from group r/t tiredness (new admission) Quality of Participation: n/a Interactions with others: n/a Mood/Affect: n/a Triggers (if applicable): n/a Cognition: n/a Progress: Other Response: n/a Plan: follow-up needed  Patients Problems:  Patient Active Problem List   Diagnosis Date Noted   Tardive dyskinesia 02/24/2023   Schizoaffective disorder, bipolar type (HCC) 02/20/2023   Homelessness 12/13/2022   Polysubstance abuse (HCC) 12/13/2022   Substance induced mood disorder (HCC) 12/13/2022   Suicidal ideation 12/13/2022   Severe recurrent major depression (HCC) 07/16/2017   Major depressive disorder, recurrent episode, moderate (HCC) 08/13/2015   Opioid use disorder, severe, dependence (HCC) per history 08/13/2015   Benzodiazepine abuse (HCC) 08/13/2015   Tobacco use disorder 08/13/2015   Hepatitis C antibody test positive 01/22/2015   PTSD (post-traumatic stress disorder) 01/20/2015   Stimulant use disorder (HCC) 01/20/2015   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

## 2023-10-02 NOTE — ED Notes (Signed)
Pt discharged to [name] via safe transport

## 2023-10-02 NOTE — ED Notes (Addendum)
Patient A&O x 4, calm, cooperative. Observed nodding during assessment. Patient states he is very tired. Patient endorses SI w/o a plan. Patient denies HI, AVH. Patient states he feels hopeless, worthless, and lacks desire and energy to do normal activities. Patient denies previous SA. Patient states he wants assistance with SUD. Patient does not appear to be responding to internal stimuli. Patient verbally contracted for safety.

## 2023-10-02 NOTE — Progress Notes (Addendum)
Pt is (Tentatively) accepted to [facility]) TODAY 10/02/2023; Bed Assignment Facility Based Crisis PENDING EKG, CMP, CBC, Ethanol and Signed Voluntary consent uploaded to pt's chart or faxed to [phone].  -Per [facility] Pt must sign a voluntary consent and fax that to [phone]. Original consent must accompany patient at transport.  DX: Substance Induced Mood D.O.   Address: [address] [place], [place] [address]  Pt meets [facility] criteria per: Madelin Rear  Attending Physician will be Dr. Ancil Linsey  Report can be called to: [phone]  Pt can arrive after: [facility] to coordinate with care team upon completion of PENDING items.  Care Team notified:[facility] Linsey Margot Ables Tee,MD, [address] [place], [place] 10/02/2023 @ 10:18 AM

## 2023-10-02 NOTE — ED Notes (Signed)
Patient in the bedroom calm and sleeping. NAD, respirations are even and unlabored.  The environment secured per policy. Will monitor for safety.
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
## 2023-10-02 NOTE — ED Notes (Signed)
Pt is in the bedroom calm and sleeping. NAD Respirations are even and unlabored. Will continue to monitor for safety.

## 2023-10-02 NOTE — ED Provider Notes (Signed)
I was asked to follow-up on the patient's labs for medical clearance that he can be evaluated by psychiatry.  Labs are pending at time of signout.  Physical Exam  BP 133/87 (BP Location: Right Arm)   Pulse 61   Temp 98.1 F (36.7 C) (Oral)   Resp 16   Ht 5\' 9"  (1.753 m)   Wt 83 kg   SpO2 100%   BMI 27.02 kg/m   Physical Exam General: No acute distress  Procedures  Procedures  ED Course / MDM    Medical Decision Making Amount and/or Complexity of Data Reviewed Labs: ordered.  Risk OTC drugs. Prescription drug management.   The patient's labs and EKG are unremarkable.  He is medically cleared for psychiatric evaluation.       Durwin Glaze, MD 10/02/23 [phone]

## 2023-10-02 NOTE — ED Notes (Signed)
 Pt wanded by security.

## 2023-10-02 NOTE — ED Triage Notes (Signed)
Pt states he is here for SI as well as wants detox from Drugs and ETOH.

## 2023-10-02 NOTE — Group Note (Signed)
Group Topic: Recovery Basics  Group Date: 10/02/2023 Start Time: 2000 End Time: 2030 Facilitators: Guss Bunde  Department: [facility]  Number of Participants: 4  Group Focus: check in Treatment Modality:  Solution-Focused Therapy Interventions utilized were support Purpose: reinforce self-care  Name: Cole [name] Date of Birth: 15-Apr-1981  MR: [id number]    Level of Participation: active Quality of Participation: cooperative Interactions with others: gave feedback Mood/Affect: appropriate Triggers (if applicable):  Cognition: goal directed Progress: Gaining insight Response: Pt wants to find housing before discharged from [facility] Plan: patient will be encouraged to follow up on goals  Patients Problems:  Patient Active Problem List   Diagnosis Date Noted   Tardive dyskinesia 02/24/2023   Schizoaffective disorder, bipolar type (HCC) 02/20/2023   Homelessness 12/13/2022   Polysubstance abuse (HCC) 12/13/2022   Substance induced mood disorder (HCC) 12/13/2022   Suicidal ideation 12/13/2022   Severe recurrent major depression (HCC) 07/16/2017   Major depressive disorder, recurrent episode, moderate (HCC) 08/13/2015   Opioid use disorder, severe, dependence (HCC) per history 08/13/2015   Benzodiazepine abuse (HCC) 08/13/2015   Tobacco use disorder 08/13/2015   Hepatitis C antibody test positive 01/22/2015   PTSD (post-traumatic stress disorder) 01/20/2015   Stimulant use disorder (HCC) 01/20/2015   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

## 2023-10-03 DIAGNOSIS — R45851 Suicidal ideations: Secondary | ICD-10-CM | POA: Insufficient documentation

## 2023-10-03 DIAGNOSIS — F17210 Nicotine dependence, cigarettes, uncomplicated: Secondary | ICD-10-CM | POA: Insufficient documentation

## 2023-10-03 DIAGNOSIS — F1910 Other psychoactive substance abuse, uncomplicated: Secondary | ICD-10-CM | POA: Insufficient documentation

## 2023-10-03 DIAGNOSIS — F1111 Opioid abuse, in remission: Secondary | ICD-10-CM | POA: Insufficient documentation

## 2023-10-03 DIAGNOSIS — Z5900 Homelessness unspecified: Secondary | ICD-10-CM | POA: Diagnosis not present

## 2023-10-03 DIAGNOSIS — F3132 Bipolar disorder, current episode depressed, moderate: Secondary | ICD-10-CM | POA: Insufficient documentation

## 2023-10-03 MED ORDER — NICOTINE POLACRILEX 2 MG MT GUM
2.0000 mg | CHEWING_GUM | Freq: Once | OROMUCOSAL | Status: AC
  Administered 2023-10-03: 2 mg via ORAL
  Filled 2023-10-03: qty 1

## 2023-10-03 MED ORDER — NICOTINE POLACRILEX 2 MG MT GUM
2.0000 mg | CHEWING_GUM | OROMUCOSAL | Status: DC | PRN
  Administered 2023-10-03: 2 mg via ORAL
  Filled 2023-10-03: qty 1

## 2023-10-03 NOTE — Group Note (Signed)
Group Topic: Relaxation  Group Date: 10/03/2023 Start Time: 1200 End Time: 1230 Facilitators: Jenean Lindau, RN  Department: [facility]  Number of Participants: 7  Group Focus: coping skills and relaxation Treatment Modality:  Behavior Modification Therapy Interventions utilized were exploration and group exercise Purpose: express feelings, express irrational fears, improve communication skills, increase insight, and regain self-worth  Name: Cole [name] Date of Birth: 09/19/1980  MR: [id number]    Level of Participation:  Patient did not participate Quality of Participation:   Interactions with others:   Mood/Affect:   Triggers (if applicable):   Cognition:   Progress: None Response:   Plan: follow-up needed  Patients Problems:  Patient Active Problem List   Diagnosis Date Noted   Tardive dyskinesia 02/24/2023   Schizoaffective disorder, bipolar type (HCC) 02/20/2023   Homelessness 12/13/2022   Polysubstance abuse (HCC) 12/13/2022   Substance induced mood disorder (HCC) 12/13/2022   Suicidal ideation 12/13/2022   Severe recurrent major depression (HCC) 07/16/2017   Major depressive disorder, recurrent episode, moderate (HCC) 08/13/2015   Opioid use disorder, severe, dependence (HCC) per history 08/13/2015   Benzodiazepine abuse (HCC) 08/13/2015   Tobacco use disorder 08/13/2015   Hepatitis C antibody test positive 01/22/2015   PTSD (post-traumatic stress disorder) 01/20/2015   Stimulant use disorder (HCC) 01/20/2015   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

## 2023-10-03 NOTE — Progress Notes (Signed)
Meal given to pt.

## 2023-10-03 NOTE — ED Provider Notes (Signed)
Facility Based Crisis Admission H&P  Date: 10/03/23 Patient Name: Cole [name] MRN: [id number] Chief Complaint: SI with plan to cut wrists  Diagnoses:  Final diagnoses:  None    HPI: Cole [name] is a 43 year old male who voluntarily presented to the [facility] with complaints of suicidal ideation, which has been present for the past three days. He reports that the suicidal thoughts were triggered by stressors related to homelessness and unemployment. He expressed a desire to enter rehab but understands he needs to detox before being considered for admission. He currently has no source of income and is homeless.  In addition to suicidal ideation, he occasionally experiences feelings of paranoia but denies homicidal ideation or auditory/visual hallucinations. Aside from his low mood, he denies other symptoms of depression.  Cole [name] has a history of diagnoses including anxiety, major depressive disorder (MDD), and bipolar disorder. He is currently taking Subutex, Depakote, Gabapentin, Xanax, and Suboxone. He is followed by a psychiatrist at [facility] and reports a psychiatric hospitalization two years ago for suicidal ideation. He also reports engaging in self-harm behavior, such as hitting his elbow and head.  Total Time spent with patient: 45 minutes  Musculoskeletal  Strength & Muscle Tone: within normal limits Gait & Station: normal Patient leans: N/A  Psychiatric Specialty Exam  Presentation General Appearance:  Appropriate for Environment; Casual; Other (comment) (tattoos on face)  Eye Contact: Good  Speech: Clear and Coherent; Normal Rate  Speech Volume: Normal  Handedness: Right   Mood and Affect  Mood: Euthymic  Affect: Appropriate; Congruent   Thought Process  Thought Processes: Coherent; Linear  Descriptions of Associations:Intact  Orientation:Full (Time, Place and Person)  Thought Content:Logical   Diagnosis of Schizophrenia or Schizoaffective disorder in past: No   Hallucinations:Hallucinations: None   Ideas of Reference:None  Suicidal Thoughts:Suicidal Thoughts: Yes, Active SI Active Intent and/or Plan: With Intent; With Plan; Without Means to Carry Out; Without Access to Means   Homicidal Thoughts:No data recorded   Sensorium  Memory: Immediate Good; Recent Good; Remote Good  Judgment: Intact  Insight: Good   Executive Functions  Concentration: Fair  Attention Span: Good  Recall: Good  Fund of Knowledge: Good  Language: Good   Psychomotor Activity  Psychomotor Activity: Psychomotor Activity: Normal    Assets  Assets: Resilience; Communication Skills; Physical Health; Social Support   Sleep  Sleep: Sleep: Fair    Nutritional Assessment (For OBS and FBC admissions only) Has the patient had a weight loss or gain of 10 pounds or more in the last 3 months?: No Has the patient had a decrease in food intake/or appetite?: No Does the patient have dental problems?: No Does the patient have eating habits or behaviors that may be indicators of an eating disorder including binging or inducing vomiting?: No Has the patient recently lost weight without trying?: 0 Has the patient been eating poorly because of a decreased appetite?: 0 Malnutrition Screening Tool Score: 0     Physical Exam  General: Pleasant, well-appearing male. No acute distress. Pulmonary: Normal effort. No wheezing or rales. Skin: No obvious rash or lesions. Neuro: A&Ox3.No focal deficit.   Review of Systems  Constitutional: Negative.  Negative for chills, fever and weight loss.  Respiratory: Negative.    Cardiovascular: Negative.   Gastrointestinal:  Negative for constipation, diarrhea, nausea and vomiting.  Musculoskeletal: Positive headache.   Skin: Negative.   Neurological: Negative.  Negative for tingling.     Blood pressure 118/65, pulse 64,  temperature 98.4 F  (36.9 C), temperature source Oral, resp. rate 18, height 5\' 9"  (1.753 m), weight 182 lb (82.6 kg), SpO2 100%. Body mass index is 26.88 kg/m.  Past Psychiatric History:  Dx: Anxiety, MDD, bipolar disorder Medications: Gabapentin 800 mg 3 times daily, Xanax 1 mg 3 times daily as needed Psychiatrist: [facility], last visit on Friday 09/30/2023 Therapist: [facility] Hospitalizations: 1 year ago for SI History of SI: Reports twice in the past History of self harm: Reports hitting his elbow and head once    Is the patient at risk to self? Yes  Has the patient been a risk to self in the past 6 months? Yes .    Has the patient been a risk to self within the distant past? Yes   Is the patient a risk to others? No   Has the patient been a risk to others in the past 6 months? No   Has the patient been a risk to others within the distant past? No   Past Medical History: Asthma, chronic back pain, hepatitis C, peripheral neuropathy  Family History: Hyperlipidemia, diabetes  Social History:  Living: On home for the past 2 months, intermittently goes to homeless shelters Job: Unemployed for the past 3 weeks, 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, applying for disability Support: Network engineer Smoking: Smokes 1 pack a day since 43 years old Alcohol: Drinks 3-4 beers 2-3x a week since 43 years old, denies withdrawal symptoms of tremor and seizures, most recent drink was yesterday Illicit drugs:  -Uses a gram of cocaine 2-3 times weekly since he was 43 years old -Daily marijuana use, recently increased the amount to quarter daily for the past 4 months, all his life  Last Labs:  Admission on 10/02/2023, Discharged on 10/02/2023  Component Date Value Ref Range Status   Sodium 10/02/2023 138  135 - 145 mmol/L Final   Potassium 10/02/2023 3.7  3.5 - 5.1 mmol/L Final   Chloride 10/02/2023 103  98 - 111 mmol/L Final   CO2 10/02/2023 28  22 - 32 mmol/L Final   Glucose,  Bld 10/02/2023 82  70 - 99 mg/dL Final   Glucose reference range applies only to samples taken after fasting for at least 8 hours.   BUN 10/02/2023 24 (H)  6 - 20 mg/dL Final   Creatinine, Ser 10/02/2023 0.82  0.61 - 1.24 mg/dL Final   Calcium 96/11/5407 8.7 (L)  8.9 - 10.3 mg/dL Final   Total Protein 81/19/1478 7.0  6.5 - 8.1 g/dL Final   Albumin 29/56/2130 3.7  3.5 - 5.0 g/dL Final   AST 86/57/8469 34  15 - 41 U/L Final   ALT 10/02/2023 24  0 - 44 U/L Final   Alkaline Phosphatase 10/02/2023 46  38 - 126 U/L Final   Total Bilirubin 10/02/2023 0.8  0.0 - 1.2 mg/dL Final   GFR, Estimated 10/02/2023 >60  >60 mL/min Final   Comment: (NOTE) Calculated using the CKD-EPI Creatinine Equation (2021)    Anion gap 10/02/2023 7  5 - 15 Final   Performed at [facility], [address] [place], [place] [address]   Alcohol, Ethyl (B) 10/02/2023 <10  <10 mg/dL Final   Comment: (NOTE) Lowest detectable limit for serum alcohol is 10 mg/dL.  For medical purposes only. Performed at [facility], [address], [place], [place] [address]    Salicylate Lvl 10/02/2023 <7.0 (L)  7.0 - 30.0 mg/dL Final   Performed at [facility], [address] [place], [place] [address]   Acetaminophen (Tylenol), Serum 10/02/2023 <10 (L)  10 - 30 ug/mL Final   Comment: (NOTE) Therapeutic concentrations vary significantly. A range of 10-30 ug/mL  may be an effective concentration for many patients. However, some  are best treated at concentrations outside of this range. Acetaminophen concentrations >150 ug/mL at 4 hours after ingestion  and >50 ug/mL at 12 hours after ingestion are often associated with  toxic reactions.  Performed at [facility], [address], [place], [place] [address]    WBC 10/02/2023 4.8  4.0 - 10.5 K/uL Final   RBC 10/02/2023 4.92  4.22 - 5.81 MIL/uL Final   Hemoglobin 10/02/2023 12.8 (L)  13.0 - 17.0 g/dL Final   HCT 09/81/1914 39.2  39.0 - 52.0 % Final   MCV 10/02/2023 79.7 (L)  80.0 -  100.0 fL Final   MCH 10/02/2023 26.0  26.0 - 34.0 pg Final   MCHC 10/02/2023 32.7  30.0 - 36.0 g/dL Final   RDW 78/29/5621 13.5  11.5 - 15.5 % Final   Platelets 10/02/2023 162  150 - 400 K/uL Final   nRBC 10/02/2023 0.0  0.0 - 0.2 % Final   Performed at [facility], [address], [place], [place] [address]   Opiates 10/02/2023 NONE DETECTED  NONE DETECTED Final   Cocaine 10/02/2023 POSITIVE (A)  NONE DETECTED Final   Benzodiazepines 10/02/2023 NONE DETECTED  NONE DETECTED Final   Amphetamines 10/02/2023 NONE DETECTED  NONE DETECTED Final   Tetrahydrocannabinol 10/02/2023 POSITIVE (A)  NONE DETECTED Final   Barbiturates 10/02/2023 NONE DETECTED  NONE DETECTED Final   Comment: (NOTE) DRUG SCREEN FOR MEDICAL PURPOSES ONLY.  IF CONFIRMATION IS NEEDED FOR ANY PURPOSE, NOTIFY LAB WITHIN 5 DAYS.  LOWEST DETECTABLE LIMITS FOR URINE DRUG SCREEN Drug Class                     Cutoff (ng/mL) Amphetamine and metabolites    1000 Barbiturate and metabolites    200 Benzodiazepine                 200 Opiates and metabolites        300 Cocaine and metabolites        300 THC                            50 Performed at [facility], [address] [place], [place] [address]   Admission on 08/07/2023, Discharged on 08/07/2023  Component Date Value Ref Range Status   Sodium 08/07/2023 137  135 - 145 mmol/L Final   Potassium 08/07/2023 3.7  3.5 - 5.1 mmol/L Final   Chloride 08/07/2023 99  98 - 111 mmol/L Final   CO2 08/07/2023 29  22 - 32 mmol/L Final   Glucose, Bld 08/07/2023 69 (L)  70 - 99 mg/dL Final   Glucose reference range applies only to samples taken after fasting for at least 8 hours.   BUN 08/07/2023 17  6 - 20 mg/dL Final   Creatinine, Ser 08/07/2023 0.88  0.61 - 1.24 mg/dL Final   Calcium 62/95/2841 9.4  8.9 - 10.3 mg/dL Final   Total Protein 32/44/0102 8.1  6.5 - 8.1 g/dL Final   Albumin 72/53/6644 4.5  3.5 - 5.0 g/dL Final   AST 03/47/4259 21  15 - 41 U/L Final   ALT 08/07/2023  27  0 - 44 U/L Final  Alkaline Phosphatase 08/07/2023 47  38 - 126 U/L Final   Total Bilirubin 08/07/2023 0.8  <1.2 mg/dL Final   GFR, Estimated 08/07/2023 >60  >60 mL/min Final   Comment: (NOTE) Calculated using the CKD-EPI Creatinine Equation (2021)    Anion gap 08/07/2023 9  5 - 15 Final   Performed at [facility], [address] [place], [place] [address]   Alcohol, Ethyl (B) 08/07/2023 <10  <10 mg/dL Final   Comment: (NOTE) Lowest detectable limit for serum alcohol is 10 mg/dL.  For medical purposes only. Performed at [facility], [address] [place], [place] [address]    WBC 08/07/2023 9.0  4.0 - 10.5 K/uL Final   RBC 08/07/2023 5.90 (H)  4.22 - 5.81 MIL/uL Final   Hemoglobin 08/07/2023 15.0  13.0 - 17.0 g/dL Final   HCT 86/57/8469 47.6  39.0 - 52.0 % Final   MCV 08/07/2023 80.7  80.0 - 100.0 fL Final   MCH 08/07/2023 25.4 (L)  26.0 - 34.0 pg Final   MCHC 08/07/2023 31.5  30.0 - 36.0 g/dL Final   RDW 62/95/2841 13.4  11.5 - 15.5 % Final   Platelets 08/07/2023 167  150 - 400 K/uL Final   nRBC 08/07/2023 0.0  0.0 - 0.2 % Final   Performed at [facility], [address] [place], [place] [address]  Admission on 07/29/2023, Discharged on 08/02/2023  Component Date Value Ref Range Status   Ferritin 07/30/2023 26  24 - 336 ng/mL Final   Performed at [place] [facility], [address], [place], [place] [address]   Hgb A1c MFr Bld 07/30/2023 5.5  4.8 - 5.6 % Final   Comment: (NOTE) Pre diabetes:          5.7%-6.4%  Diabetes:              >6.4%  Glycemic control for   <7.0% adults with diabetes    Mean Plasma Glucose 07/30/2023 111.15  mg/dL Final   Performed at [facility], [address] [place], [place] [address]   TSH 07/30/2023 3.653  0.350 - 4.500 uIU/mL Final   Comment: Performed by a 3rd Generation assay with a functional sensitivity of <=0.01 uIU/mL. Performed at [facility], [address] [place], [place] [address]     Cholesterol 07/30/2023 147  0 - 200 mg/dL Final   Triglycerides 42/59/5638 83  <150 mg/dL Final   HDL 75/64/3329 56  >40 mg/dL Final   Total CHOL/HDL Ratio 07/30/2023 2.6  RATIO Final   VLDL 07/30/2023 17  0 - 40 mg/dL Final   LDL Cholesterol 07/30/2023 74  0 - 99 mg/dL Final   Comment:        Total Cholesterol/HDL:CHD Risk Coronary Heart Disease Risk Table                     Men   Women  1/2 Average Risk   3.4   3.3  Average Risk       5.0   4.4  2 X Average Risk   9.6   7.1  3 X Average Risk  23.4   11.0        Use the calculated Patient Ratio above and the CHD Risk Table to determine the patient's CHD Risk.        ATP III CLASSIFICATION (LDL):  <100     mg/dL   Optimal  518-841  mg/dL   Near or Above  Optimal  130-159  mg/dL   Borderline  161-096  mg/dL   High  >045     mg/dL   Very High Performed at [facility], [address] [place], [place] [address]    Hepatitis B Surface Ag 07/30/2023 NON REACTIVE  NON REACTIVE Final   HCV Ab 07/30/2023 Reactive (A)  NON REACTIVE Final   Comment: (NOTE) The CDC recommends that a Reactive HCV antibody result be followed up  with a HCV Nucleic Acid Amplification test.     Hep A IgM 07/30/2023 NON REACTIVE  NON REACTIVE Final   Hep B C IgM 07/30/2023 NON REACTIVE  NON REACTIVE Final   Performed at [facility], [address], [place], [place] [address]   Valproic Acid Lvl 07/30/2023 58  50.0 - 100.0 ug/mL Final   Performed at [facility], [address], [place], [place] [address]   Iron 07/30/2023 48  45 - 182 ug/dL Final   TIBC 21/30/8657 421  250 - 450 ug/dL Final   Saturation Ratios 07/30/2023 11 (L)  17.9 - 39.5 % Final   UIBC 07/30/2023 373  ug/dL Final   Performed at [facility], [address] [place], [place] [address]  Admission on 07/28/2023, Discharged on 07/29/2023  Component Date Value Ref Range Status   Sodium 07/28/2023 138   135 - 145 mmol/L Final   Potassium 07/28/2023 3.5  3.5 - 5.1 mmol/L Final   Chloride 07/28/2023 107  98 - 111 mmol/L Final   CO2 07/28/2023 26  22 - 32 mmol/L Final   Glucose, Bld 07/28/2023 91  70 - 99 mg/dL Final   Glucose reference range applies only to samples taken after fasting for at least 8 hours.   BUN 07/28/2023 14  6 - 20 mg/dL Final   Creatinine, Ser 07/28/2023 0.74  0.61 - 1.24 mg/dL Final   Calcium 29/52/8413 8.7 (L)  8.9 - 10.3 mg/dL Final   Total Protein 24/40/1027 7.0  6.5 - 8.1 g/dL Final   Albumin 25/36/6440 3.8  3.5 - 5.0 g/dL Final   AST 34/74/2595 14 (L)  15 - 41 U/L Final   ALT 07/28/2023 16  0 - 44 U/L Final   Alkaline Phosphatase 07/28/2023 38  38 - 126 U/L Final   Total Bilirubin 07/28/2023 1.1  <1.2 mg/dL Final   GFR, Estimated 07/28/2023 >60  >60 mL/min Final   Comment: (NOTE) Calculated using the CKD-EPI Creatinine Equation (2021)    Anion gap 07/28/2023 5  5 - 15 Final   Performed at [facility], [address] [place], [place] [address]   Alcohol, Ethyl (B) 07/28/2023 <10  <10 mg/dL Final   Comment: (NOTE) Lowest detectable limit for serum alcohol is 10 mg/dL.  For medical purposes only. Performed at [facility], [address] [place], [place] [address]    Opiates 07/28/2023 NONE DETECTED  NONE DETECTED Final   Cocaine 07/28/2023 POSITIVE (A)  NONE DETECTED Final   Benzodiazepines 07/28/2023 POSITIVE (A)  NONE DETECTED Final   Amphetamines 07/28/2023 NONE DETECTED  NONE DETECTED Final   Tetrahydrocannabinol 07/28/2023 POSITIVE (A)  NONE DETECTED Final   Barbiturates 07/28/2023 NONE DETECTED  NONE DETECTED Final   Comment: (NOTE) DRUG SCREEN FOR MEDICAL PURPOSES ONLY.  IF CONFIRMATION IS NEEDED FOR ANY PURPOSE, NOTIFY LAB WITHIN 5 DAYS.  LOWEST DETECTABLE LIMITS FOR URINE DRUG SCREEN Drug Class  Cutoff (ng/mL) Amphetamine and metabolites    1000 Barbiturate and metabolites    200 Benzodiazepine                  200 Opiates and metabolites        300 Cocaine and metabolites        300 THC                            50 Performed at [facility], [address], [place], [place] [address]    WBC 07/28/2023 4.9  4.0 - 10.5 K/uL Final   RBC 07/28/2023 5.03  4.22 - 5.81 MIL/uL Final   Hemoglobin 07/28/2023 12.8 (L)  13.0 - 17.0 g/dL Final   HCT 60/45/4098 39.7  39.0 - 52.0 % Final   MCV 07/28/2023 78.9 (L)  80.0 - 100.0 fL Final   MCH 07/28/2023 25.4 (L)  26.0 - 34.0 pg Final   MCHC 07/28/2023 32.2  30.0 - 36.0 g/dL Final   RDW 11/91/4782 13.4  11.5 - 15.5 % Final   Platelets 07/28/2023 148 (L)  150 - 400 K/uL Final   nRBC 07/28/2023 0.0  0.0 - 0.2 % Final   Neutrophils Relative % 07/28/2023 52  % Final   Neutro Abs 07/28/2023 2.5  1.7 - 7.7 K/uL Final   Lymphocytes Relative 07/28/2023 38  % Final   Lymphs Abs 07/28/2023 1.8  0.7 - 4.0 K/uL Final   Monocytes Relative 07/28/2023 8  % Final   Monocytes Absolute 07/28/2023 0.4  0.1 - 1.0 K/uL Final   Eosinophils Relative 07/28/2023 2  % Final   Eosinophils Absolute 07/28/2023 0.1  0.0 - 0.5 K/uL Final   Basophils Relative 07/28/2023 0  % Final   Basophils Absolute 07/28/2023 0.0  0.0 - 0.1 K/uL Final   Immature Granulocytes 07/28/2023 0  % Final   Abs Immature Granulocytes 07/28/2023 0.01  0.00 - 0.07 K/uL Final   Performed at [facility], [address] [place], [place] [address]   Salicylate Lvl 07/28/2023 <7.0 (L)  7.0 - 30.0 mg/dL Final   Performed at [facility], [address], [place], [place] [address]   Acetaminophen (Tylenol), Serum 07/28/2023 <10 (L)  10 - 30 ug/mL Final   Comment: (NOTE) Therapeutic concentrations vary significantly. A range of 10-30 ug/mL  may be an effective concentration for many patients. However, some  are best treated at concentrations outside of this range. Acetaminophen concentrations >150 ug/mL at 4 hours after ingestion  and >50 ug/mL at 12 hours after ingestion are often associated with  toxic  reactions.  Performed at [facility], [address], [place], [place] [address]   Admission on 05/17/2023, Discharged on 05/17/2023  Component Date Value Ref Range Status   Sodium 05/17/2023 136  135 - 145 mmol/L Final   Potassium 05/17/2023 3.1 (L)  3.5 - 5.1 mmol/L Final   Chloride 05/17/2023 99  98 - 111 mmol/L Final   CO2 05/17/2023 28  22 - 32 mmol/L Final   Glucose, Bld 05/17/2023 90  70 - 99 mg/dL Final   Glucose reference range applies only to samples taken after fasting for at least 8 hours.   BUN 05/17/2023 12  6 - 20 mg/dL Final   Creatinine, Ser 05/17/2023 0.83  0.61 - 1.24 mg/dL Final   Calcium 62/95/2841 8.5 (L)  8.9 - 10.3 mg/dL Final   Total Protein 32/44/0102 7.3  6.5 - 8.1 g/dL  Final   Albumin 05/17/2023 4.0  3.5 - 5.0 g/dL Final   AST 16/05/9603 18  15 - 41 U/L Final   ALT 05/17/2023 19  0 - 44 U/L Final   Alkaline Phosphatase 05/17/2023 45  38 - 126 U/L Final   Total Bilirubin 05/17/2023 1.1  0.3 - 1.2 mg/dL Final   GFR, Estimated 05/17/2023 >60  >60 mL/min Final   Comment: (NOTE) Calculated using the CKD-EPI Creatinine Equation (2021)    Anion gap 05/17/2023 9  5 - 15 Final   Performed at [facility], [address], [place], [place] [address]   Alcohol, Ethyl (B) 05/17/2023 <10  <10 mg/dL Final   Comment: (NOTE) Lowest detectable limit for serum alcohol is 10 mg/dL.  For medical purposes only. Performed at [facility], [address], [place], [place] [address]    Salicylate Lvl 05/17/2023 <7.0 (L)  7.0 - 30.0 mg/dL Final   Performed at [facility], [address] [place], [place] [address]   Acetaminophen (Tylenol), Serum 05/17/2023 <10 (L)  10 - 30 ug/mL Final   Comment: (NOTE) Therapeutic concentrations vary significantly. A range of 10-30 ug/mL  may be an effective concentration for many patients. However, some  are best treated at concentrations outside of this range. Acetaminophen concentrations >150 ug/mL at 4 hours after ingestion  and >50  ug/mL at 12 hours after ingestion are often associated with  toxic reactions.  Performed at [facility], [address], [place], [place] [address]    WBC 05/17/2023 6.3  4.0 - 10.5 K/uL Final   RBC 05/17/2023 4.95  4.22 - 5.81 MIL/uL Final   Hemoglobin 05/17/2023 12.6 (L)  13.0 - 17.0 g/dL Final   HCT 86/57/8469 39.2  39.0 - 52.0 % Final   MCV 05/17/2023 79.2 (L)  80.0 - 100.0 fL Final   MCH 05/17/2023 25.5 (L)  26.0 - 34.0 pg Final   MCHC 05/17/2023 32.1  30.0 - 36.0 g/dL Final   RDW 62/95/2841 14.1  11.5 - 15.5 % Final   Platelets 05/17/2023 156  150 - 400 K/uL Final   nRBC 05/17/2023 0.0  0.0 - 0.2 % Final   Performed at [facility], [address], [place], [place] [address]   Opiates 05/17/2023 NONE DETECTED  NONE DETECTED Final   Cocaine 05/17/2023 POSITIVE (A)  NONE DETECTED Final   Benzodiazepines 05/17/2023 NONE DETECTED  NONE DETECTED Final   Amphetamines 05/17/2023 NONE DETECTED  NONE DETECTED Final   Tetrahydrocannabinol 05/17/2023 POSITIVE (A)  NONE DETECTED Final   Barbiturates 05/17/2023 NONE DETECTED  NONE DETECTED Final   Comment: (NOTE) DRUG SCREEN FOR MEDICAL PURPOSES ONLY.  IF CONFIRMATION IS NEEDED FOR ANY PURPOSE, NOTIFY LAB WITHIN 5 DAYS.  LOWEST DETECTABLE LIMITS FOR URINE DRUG SCREEN Drug Class                     Cutoff (ng/mL) Amphetamine and metabolites    1000 Barbiturate and metabolites    200 Benzodiazepine                 200 Opiates and metabolites        300 Cocaine and metabolites        300 THC                            50 Performed at [facility], [address], [place], [place] [address]     Allergies: Aleve [naproxen  sodium], Aspirin, Ibuprofen, Seroquel [quetiapine fumerate], Tramadol, and Tylenol [acetaminophen]  Medications:  Facility Ordered Medications  Medication   [COMPLETED] buprenorphine-naloxone (SUBOXONE) 8-2 mg per SL tablet 1 tablet   divalproex (DEPAKOTE) DR tablet 500 mg   gabapentin (NEURONTIN) capsule 200 mg    nicotine (NICODERM CQ - dosed in mg/24 hours) patch 21 mg   risperiDONE (RISPERDAL) tablet 1 mg   traZODone (DESYREL) tablet 100 mg   alum & mag hydroxide-simeth (MAALOX/MYLANTA) 200-200-20 MG/5ML suspension 30 mL   magnesium hydroxide (MILK OF MAGNESIA) suspension 30 mL   thiamine (VITAMIN B1) tablet 100 mg   multivitamin with minerals tablet 1 tablet   LORazepam (ATIVAN) tablet 1 mg   hydrOXYzine (ATARAX) tablet 25 mg   loperamide (IMODIUM) capsule 2-4 mg   ondansetron (ZOFRAN-ODT) disintegrating tablet 4 mg   haloperidol (HALDOL) tablet 5 mg   Or   haloperidol lactate (HALDOL) injection 10 mg   LORazepam (ATIVAN) tablet 1 mg   Or   LORazepam (ATIVAN) injection 2 mg   [COMPLETED] nicotine polacrilex (NICORETTE) gum 2 mg   PTA Medications  Medication Sig   ALPRAZolam (XANAX) 1 MG tablet Take 1 mg by mouth 3 (three) times daily.   baclofen (LIORESAL) 10 MG tablet Take 10 mg by mouth 3 (three) times daily as needed.   buprenorphine (SUBUTEX) 8 MG SUBL SL tablet Place 16 mg under the tongue daily. (Patient not taking: Reported on 10/03/2023)   VRAYLAR 1.5 MG capsule Take 1.5 mg by mouth daily.   gabapentin (NEURONTIN) 800 MG tablet Take 800 mg by mouth 3 (three) times daily.   SUBOXONE 8-2 MG FILM Place 1 Film under the tongue 2 (two) times daily.   divalproex (DEPAKOTE) 500 MG DR tablet Take 1 tablet (500 mg total) by mouth every 12 (twelve) hours.   nicotine (NICODERM CQ - DOSED IN MG/24 HOURS) 14 mg/24hr patch Place 1 patch (14 mg total) onto the skin daily.   nicotine polacrilex (NICORETTE) 2 MG gum Take 1 each (2 mg total) by mouth as needed for smoking cessation.    Medical Decision Making  Admit patient for crisis stabilization and medication management. We discussed briefly the need to discontinue xanax and suboxone use. He verbalizes understanding.   Resume Suboxone 8mg  po BID. PDMP verified, last filled 09/30/2023 #14 day supply. Overdose risk score of 590.   -Will start  gabapentin 200mg  po TID. Will start Depakote 500mg  po BID -Risperdal 1mg  po BID -Continue thiamine for AUD.  -Continue ativan detox protocol.  -Patient encouraged to call transitional housing and rehab centers to show ongoing partcipation. He was seen later at the phone making phone calls.   Recommendations  Based on my evaluation the patient does not appear to have an emergency medical condition.  Maryagnes Amos, FNP 10/03/23  4:50 PM

## 2023-10-03 NOTE — ED Notes (Signed)
Pt is in the bedroom calm and sleeping. NAD Respirations are even and unlabored. Will continue to monitor for safety.

## 2023-10-03 NOTE — ED Notes (Signed)
Patient remains asleep in bed witout issue or complaint.  No distress or signs of withdrawal at this time.  Will monitor.  Denies avh shi or plan.

## 2023-10-03 NOTE — ED Notes (Signed)
 Pt is in the dayroom watching TV with peers. Pt denies SI/HI/AVH. Pt has no further complain.No acute distress noted. Will continue to monitor for safety and provide support.

## 2023-10-03 NOTE — Group Note (Unsigned)
Group Topic: Relaxation  Group Date: 10/03/2023 Start Time: 1210 End Time: 1230 Facilitators: Jenean Lindau, RN  Department: [facility]  Number of Participants: 10  Group Focus: anxiety, coping skills, and relaxation Treatment Modality:  Behavior Modification Therapy Interventions utilized were exploration and group exercise Purpose: enhance coping skills, explore maladaptive thinking, express feelings, and reinforce self-care   Name: Cole [name] Date of Birth: Jan 18, 1981  MR: [id number]    Level of Participation: {THERAPIES; PSYCH GROUP PARTICIPATION VOZDG:64403} Quality of Participation: {THERAPIES; PSYCH QUALITY OF PARTICIPATION:23992} Interactions with others: {THERAPIES; PSYCH INTERACTIONS:23993} Mood/Affect: {THERAPIES; PSYCH MOOD/AFFECT:23994} Triggers (if applicable): *** Cognition: {THERAPIES; PSYCH COGNITION:23995} Progress: {THERAPIES; PSYCH PROGRESS:23997} Response: *** Plan: {THERAPIES; PSYCH KVQQ:59563}  Patients Problems:  Patient Active Problem List   Diagnosis Date Noted   Tardive dyskinesia 02/24/2023   Schizoaffective disorder, bipolar type (HCC) 02/20/2023   Homelessness 12/13/2022   Polysubstance abuse (HCC) 12/13/2022   Substance induced mood disorder (HCC) 12/13/2022   Suicidal ideation 12/13/2022   Severe recurrent major depression (HCC) 07/16/2017   Major depressive disorder, recurrent episode, moderate (HCC) 08/13/2015   Opioid use disorder, severe, dependence (HCC) per history 08/13/2015   Benzodiazepine abuse (HCC) 08/13/2015   Tobacco use disorder 08/13/2015   Hepatitis C antibody test positive 01/22/2015   PTSD (post-traumatic stress disorder) 01/20/2015   Stimulant use disorder (HCC) 01/20/2015   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

## 2023-10-03 NOTE — ED Notes (Signed)
 Patient is sleeping. Respirations equal and unlabored, skin warm and dry. No change in assessment or acuity. Routine safety checks conducted according to facility protocol. Will continue to monitor for safety.

## 2023-10-03 NOTE — Group Note (Signed)
Group Topic: Positive Affirmations  Group Date: 10/03/2023 Start Time: 1945 End Time: 2045 Facilitators: Darin Engels  Department: [facility]  Number of Participants: 3  Group Focus: acceptance, affirmation, self-awareness, and self-esteem Treatment Modality:  Patient-Centered Therapy Interventions utilized were leisure development, story telling, and support Purpose: express feelings and regain self-worth  Name: Cole [name] Date of Birth: 1980/12/04  MR: [id number]    Level of Participation: active Quality of Participation: attentive and cooperative  Interactions with others: gave feedback Mood/Affect: appropriate and positive Triggers (if applicable): n/a Cognition: coherent/clear Progress: Gaining insight Response: pt provided feedback and offered support to other group participants.  Plan: patient will be encouraged to continue attending groups  Patients Problems:  Patient Active Problem List   Diagnosis Date Noted   Tardive dyskinesia 02/24/2023   Schizoaffective disorder, bipolar type (HCC) 02/20/2023   Homelessness 12/13/2022   Polysubstance abuse (HCC) 12/13/2022   Substance induced mood disorder (HCC) 12/13/2022   Suicidal ideation 12/13/2022   Severe recurrent major depression (HCC) 07/16/2017   Major depressive disorder, recurrent episode, moderate (HCC) 08/13/2015   Opioid use disorder, severe, dependence (HCC) per history 08/13/2015   Benzodiazepine abuse (HCC) 08/13/2015   Tobacco use disorder 08/13/2015   Hepatitis C antibody test positive 01/22/2015   PTSD (post-traumatic stress disorder) 01/20/2015   Stimulant use disorder (HCC) 01/20/2015   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

## 2023-10-04 DIAGNOSIS — F1910 Other psychoactive substance abuse, uncomplicated: Secondary | ICD-10-CM | POA: Diagnosis not present

## 2023-10-04 DIAGNOSIS — Z5900 Homelessness unspecified: Secondary | ICD-10-CM | POA: Diagnosis not present

## 2023-10-04 DIAGNOSIS — F17210 Nicotine dependence, cigarettes, uncomplicated: Secondary | ICD-10-CM | POA: Diagnosis not present

## 2023-10-04 DIAGNOSIS — R45851 Suicidal ideations: Secondary | ICD-10-CM | POA: Diagnosis not present

## 2023-10-04 MED ORDER — ALPRAZOLAM 0.5 MG PO TABS
0.5000 mg | ORAL_TABLET | Freq: Three times a day (TID) | ORAL | Status: DC
  Administered 2023-10-04: 0.5 mg via ORAL
  Filled 2023-10-04: qty 1

## 2023-10-04 MED ORDER — SUBOXONE 8-2 MG SL FILM: 1.0000 | ORAL_FILM | Freq: Two times a day (BID) | SUBLINGUAL | 0 refills | Status: DC

## 2023-10-04 MED ORDER — BUPRENORPHINE HCL 8 MG SL SUBL
8.0000 mg | SUBLINGUAL_TABLET | Freq: Every day | SUBLINGUAL | Status: DC
  Administered 2023-10-04: 8 mg via SUBLINGUAL
  Filled 2023-10-04: qty 1

## 2023-10-04 MED ORDER — GABAPENTIN 100 MG PO CAPS: 200.0000 mg | ORAL_CAPSULE | Freq: Three times a day (TID) | ORAL | 0 refills | Status: DC

## 2023-10-04 MED ORDER — DIVALPROEX SODIUM 500 MG PO DR TAB: 500.0000 mg | DELAYED_RELEASE_TABLET | Freq: Two times a day (BID) | ORAL | 0 refills | Status: DC

## 2023-10-04 MED ORDER — HYDROXYZINE HCL 25 MG PO TABS: 25.0000 mg | ORAL_TABLET | Freq: Four times a day (QID) | ORAL | 0 refills | Status: DC | PRN

## 2023-10-04 MED ORDER — NICOTINE 21 MG/24HR TD PT24: 21.0000 mg | MEDICATED_PATCH | Freq: Once | TRANSDERMAL | 0 refills | Status: DC | PRN

## 2023-10-04 MED ORDER — RISPERIDONE 1 MG PO TABS: 1.0000 mg | ORAL_TABLET | Freq: Two times a day (BID) | ORAL | 0 refills | Status: DC

## 2023-10-04 NOTE — ED Notes (Signed)
 Patient is sleeping. Respirations equal and unlabored, skin warm and dry. No change in assessment or acuity. Routine safety checks conducted according to facility protocol. Will continue to monitor for safety.

## 2023-10-04 NOTE — ED Provider Notes (Signed)
 Behavioral Health Progress Note  Date and Time: 10/04/2023 3:40 PM Name: Cole [name] MRN:  [id number]  Subjective:  The patient is 43 year old male, with a polysubstance abuse, homelessness, schizoaffective disorder, depression, anxiety, chronic pain, asthma. history significant for  who presents to the emergency room for suicidal ideation. He is currently taking Subutex , Depakote , Gabapentin , Xanax , and Suboxone . He is followed by a psychiatrist at [facility] and reports a psychiatric hospitalization two years ago for suicidal ideation.   The patient appears to be in no acute distress at the time of assessment. He is alert and oriented 4, calm, and cooperative. His mood is anxious with a congruent affect. Speech is normal in rate, rhythm, and tone. Behavior is appropriate. Objectively, there is no evidence of psychosis, mania, or delusional thinking. Thought processes are coherent and goal-directed, with no signs of distractibility or preoccupation.  The patient denies suicidal or homicidal ideation, psychosis, and paranoia. The patient is requesting to be restarted on Suboxone . He also inquired about Xanax , stating that he has been taking it for the past 20 years, most recently managed by a provider at [facility].  Patient educated on the risk on using both medications. Patient encouraged to start cutting back on the xanax , and I discussed patient should not be on both medications unfortunately patient has been taking for a number of years. Patient was restarted on 0.5 mg TID today due to not being in severe withdrawal without having since admission but was requesting discharge today. Based on PDMP review DR. Maanav Kassabian filled 3 days of Suboxone  as patient is at high acute risk for opioid relapse; patient was encouraged to followup with his outpatient provider.   PDMP Check: Suboxone  8 mg - last filled on 09/30/2023 (#14 tablets) Alprazolam  1 mg - last filled on 09/15/2023 #90 (30  days supply)  The patient initiated a discussion regarding his post-discharge plan. He is inquiring about inpatient rehabilitation options versus homeless shelters.  Per nursing staff, the patient is requesting to be discharge today and needs transportation to [place], [place] to a homeless shelter.   Diagnosis:  Final diagnoses:  Bipolar affective disorder, currently depressed, moderate (HCC)  Opioid use disorder in remission  Differential diagnosis includes schizoaffective disorder, bipolar type  Total Time spent with patient: 20 minutes   Sleep: Fair  Appetite:  Good  Current Medications:  Current Facility-Administered Medications  Medication Dose Route Frequency Provider Last Rate Last Admin   ALPRAZolam  (XANAX ) tablet 0.5 mg  0.5 mg Oral TID Coleman, Carolyn H, NP   0.5 mg at 10/04/23 1016   alum & mag hydroxide-simeth (MAALOX/MYLANTA) 200-200-20 MG/5ML suspension 30 mL  30 mL Oral Q4H PRN Mardy Legacy, NP       buprenorphine  (SUBUTEX ) sublingual tablet 8 mg  8 mg Sublingual Daily Coleman, Carolyn H, NP   8 mg at 10/04/23 1016   divalproex  (DEPAKOTE ) DR tablet 500 mg  500 mg Oral Q12H Mardy Legacy, NP   500 mg at 10/04/23 9090   gabapentin  (NEURONTIN ) capsule 200 mg  200 mg Oral TID Mardy Legacy, NP   200 mg at 10/04/23 9090   haloperidol  (HALDOL ) tablet 5 mg  5 mg Oral Q6H PRN Mardy Legacy, NP       Or   haloperidol  lactate (HALDOL ) injection 10 mg  10 mg Intramuscular Q6H PRN Mardy Legacy, NP       hydrOXYzine  (ATARAX ) tablet 25 mg  25 mg Oral Q6H PRN Mardy Legacy, NP  loperamide  (IMODIUM ) capsule 2-4 mg  2-4 mg Oral PRN Mardy Legacy, NP       LORazepam  (ATIVAN ) tablet 1 mg  1 mg Oral Q6H PRN Mardy Legacy, NP       Or   LORazepam  (ATIVAN ) injection 2 mg  2 mg Intramuscular Q6H PRN Mardy Legacy, NP       LORazepam  (ATIVAN ) tablet 1 mg  1 mg Oral Q6H PRN Mardy Legacy, NP       magnesium  hydroxide (MILK OF MAGNESIA) suspension 30 mL  30 mL  Oral Daily PRN Mardy Legacy, NP       multivitamin with minerals tablet 1 tablet  1 tablet Oral Daily Mardy Legacy, NP   1 tablet at 10/04/23 9090   nicotine  (NICODERM CQ  - dosed in mg/24 hours) patch 21 mg  21 mg Transdermal Once PRN Mardy Legacy, NP       nicotine  polacrilex (NICORETTE ) gum 2 mg  2 mg Oral PRN Onuoha, Chinwendu V, NP   2 mg at 10/03/23 2127   ondansetron  (ZOFRAN -ODT) disintegrating tablet 4 mg  4 mg Oral Q6H PRN Mardy Legacy, NP       risperiDONE  (RISPERDAL ) tablet 1 mg  1 mg Oral BID Mardy Legacy, NP   1 mg at 10/04/23 0908   thiamine  (VITAMIN B1) tablet 100 mg  100 mg Oral Daily Mardy Legacy, NP   100 mg at 10/04/23 9088   traZODone  (DESYREL ) tablet 100 mg  100 mg Oral QHS Mardy Legacy, NP   100 mg at 10/03/23 2127   Current Outpatient Medications  Medication Sig Dispense Refill   divalproex  (DEPAKOTE ) 500 MG DR tablet Take 1 tablet (500 mg total) by mouth every 12 (twelve) hours. 60 tablet 0   gabapentin  (NEURONTIN ) 100 MG capsule Take 2 capsules (200 mg total) by mouth 3 (three) times daily. 180 capsule 0   hydrOXYzine  (ATARAX ) 25 MG tablet Take 1 tablet (25 mg total) by mouth every 6 (six) hours as needed (anxiety/agitation or CIWA < or = 10). 30 tablet 0   nicotine  (NICODERM CQ  - DOSED IN MG/24 HOURS) 21 mg/24hr patch Place 1 patch (21 mg total) onto the skin once as needed (nicotine  withdrawal). 28 patch 0   risperiDONE  (RISPERDAL ) 1 MG tablet Take 1 tablet (1 mg total) by mouth 2 (two) times daily. 60 tablet 0   SUBOXONE  8-2 MG FILM Place 1 Film under the tongue 2 (two) times daily for 3 days. 6 Film 0    Labs  Lab Results:  Admission on 10/02/2023, Discharged on 10/02/2023  Component Date Value Ref Range Status   Sodium 10/02/2023 138  135 - 145 mmol/L Final   Potassium 10/02/2023 3.7  3.5 - 5.1 mmol/L Final   Chloride 10/02/2023 103  98 - 111 mmol/L Final   CO2 10/02/2023 28  22 - 32 mmol/L Final   Glucose, Bld 10/02/2023 82  70 - 99  mg/dL Final   Glucose reference range applies only to samples taken after fasting for at least 8 hours.   BUN 10/02/2023 24 (H)  6 - 20 mg/dL Final   Creatinine, Ser 10/02/2023 0.82  0.61 - 1.24 mg/dL Final   Calcium 97/97/7974 8.7 (L)  8.9 - 10.3 mg/dL Final   Total Protein 97/97/7974 7.0  6.5 - 8.1 g/dL Final   Albumin 97/97/7974 3.7  3.5 - 5.0 g/dL Final   AST 97/97/7974 34  15 - 41 U/L Final   ALT 10/02/2023 24  0 - 44 U/L  Final   Alkaline Phosphatase 10/02/2023 46  38 - 126 U/L Final   Total Bilirubin 10/02/2023 0.8  0.0 - 1.2 mg/dL Final   GFR, Estimated 10/02/2023 >60  >60 mL/min Final   Comment: (NOTE) Calculated using the CKD-EPI Creatinine Equation (2021)    Anion gap 10/02/2023 7  5 - 15 Final   Performed at [facility], [address] [place], [place] [address]   Alcohol, Ethyl (B) 10/02/2023 <10  <10 mg/dL Final   Comment: (NOTE) Lowest detectable limit for serum alcohol is 10 mg/dL.  For medical purposes only. Performed at [facility], [address], [place], [place] [address]    Salicylate Lvl 10/02/2023 <7.0 (L)  7.0 - 30.0 mg/dL Final   Performed at [facility], [address], [place], [place] [address]   Acetaminophen  (Tylenol ), Serum 10/02/2023 <10 (L)  10 - 30 ug/mL Final   Comment: (NOTE) Therapeutic concentrations vary significantly. A range of 10-30 ug/mL  may be an effective concentration for many patients. However, some  are best treated at concentrations outside of this range. Acetaminophen  concentrations >150 ug/mL at 4 hours after ingestion  and >50 ug/mL at 12 hours after ingestion are often associated with  toxic reactions.  Performed at [facility], [address] [place], [place] [address]    WBC 10/02/2023 4.8  4.0 - 10.5 K/uL Final   RBC 10/02/2023 4.92  4.22 - 5.81 MIL/uL Final   Hemoglobin 10/02/2023 12.8 (L)  13.0 - 17.0 g/dL Final   HCT 97/97/7974 39.2  39.0 - 52.0 % Final   MCV 10/02/2023 79.7 (L)  80.0 - 100.0 fL Final   MCH  10/02/2023 26.0  26.0 - 34.0 pg Final   MCHC 10/02/2023 32.7  30.0 - 36.0 g/dL Final   RDW 97/97/7974 13.5  11.5 - 15.5 % Final   Platelets 10/02/2023 162  150 - 400 K/uL Final   nRBC 10/02/2023 0.0  0.0 - 0.2 % Final   Performed at [facility], [address], [place], [place] [address]   Opiates 10/02/2023 NONE DETECTED  NONE DETECTED Final   Cocaine 10/02/2023 POSITIVE (A)  NONE DETECTED Final   Benzodiazepines 10/02/2023 NONE DETECTED  NONE DETECTED Final   Amphetamines 10/02/2023 NONE DETECTED  NONE DETECTED Final   Tetrahydrocannabinol 10/02/2023 POSITIVE (A)  NONE DETECTED Final   Barbiturates 10/02/2023 NONE DETECTED  NONE DETECTED Final   Comment: (NOTE) DRUG SCREEN FOR MEDICAL PURPOSES ONLY.  IF CONFIRMATION IS NEEDED FOR ANY PURPOSE, NOTIFY LAB WITHIN 5 DAYS.  LOWEST DETECTABLE LIMITS FOR URINE DRUG SCREEN Drug Class                     Cutoff (ng/mL) Amphetamine and metabolites    1000 Barbiturate and metabolites    200 Benzodiazepine                 200 Opiates and metabolites        300 Cocaine and metabolites        300 THC                            50 Performed at [facility], [address] [place], [place] [address]   Admission on 08/07/2023, Discharged on 08/07/2023  Component Date Value Ref Range Status   Sodium 08/07/2023 137  135 - 145 mmol/L Final   Potassium 08/07/2023 3.7  3.5 - 5.1 mmol/L Final   Chloride 08/07/2023  99  98 - 111 mmol/L Final   CO2 08/07/2023 29  22 - 32 mmol/L Final   Glucose, Bld 08/07/2023 69 (L)  70 - 99 mg/dL Final   Glucose reference range applies only to samples taken after fasting for at least 8 hours.   BUN 08/07/2023 17  6 - 20 mg/dL Final   Creatinine, Ser 08/07/2023 0.88  0.61 - 1.24 mg/dL Final   Calcium 87/91/7975 9.4  8.9 - 10.3 mg/dL Final   Total Protein 87/91/7975 8.1  6.5 - 8.1 g/dL Final   Albumin 87/91/7975 4.5  3.5 - 5.0 g/dL Final   AST 87/91/7975 21  15 - 41 U/L Final   ALT 08/07/2023 27  0 - 44 U/L Final    Alkaline Phosphatase 08/07/2023 47  38 - 126 U/L Final   Total Bilirubin 08/07/2023 0.8  <1.2 mg/dL Final   GFR, Estimated 08/07/2023 >60  >60 mL/min Final   Comment: (NOTE) Calculated using the CKD-EPI Creatinine Equation (2021)    Anion gap 08/07/2023 9  5 - 15 Final   Performed at [facility], [address], [place], [place] [address]   Alcohol, Ethyl (B) 08/07/2023 <10  <10 mg/dL Final   Comment: (NOTE) Lowest detectable limit for serum alcohol is 10 mg/dL.  For medical purposes only. Performed at [facility], [address], [place], [place] [address]    WBC 08/07/2023 9.0  4.0 - 10.5 K/uL Final   RBC 08/07/2023 5.90 (H)  4.22 - 5.81 MIL/uL Final   Hemoglobin 08/07/2023 15.0  13.0 - 17.0 g/dL Final   HCT 87/91/7975 47.6  39.0 - 52.0 % Final   MCV 08/07/2023 80.7  80.0 - 100.0 fL Final   MCH 08/07/2023 25.4 (L)  26.0 - 34.0 pg Final   MCHC 08/07/2023 31.5  30.0 - 36.0 g/dL Final   RDW 87/91/7975 13.4  11.5 - 15.5 % Final   Platelets 08/07/2023 167  150 - 400 K/uL Final   nRBC 08/07/2023 0.0  0.0 - 0.2 % Final   Performed at [facility], [address], [place], [place] [address]  Admission on 07/29/2023, Discharged on 08/02/2023  Component Date Value Ref Range Status   Ferritin 07/30/2023 26  24 - 336 ng/mL Final   Performed at [facility], [address], [place], [place] [address]   Hgb A1c MFr Bld 07/30/2023 5.5  4.8 - 5.6 % Final   Comment: (NOTE) Pre diabetes:          5.7%-6.4%  Diabetes:              >6.4%  Glycemic control for   <7.0% adults with diabetes    Mean Plasma Glucose 07/30/2023 111.15  mg/dL Final   Performed at [facility], [address] [place], [place] [address]   TSH 07/30/2023 3.653  0.350 - 4.500 uIU/mL Final   Comment: Performed by a 3rd Generation assay with a functional sensitivity of <=0.01 uIU/mL. Performed at [facility], [address], [place], [place] [address]    Cholesterol  07/30/2023 147  0 - 200 mg/dL Final   Triglycerides 88/69/7975 83  <150 mg/dL Final   HDL 88/69/7975 56  >40 mg/dL Final   Total CHOL/HDL Ratio 07/30/2023 2.6  RATIO Final   VLDL 07/30/2023 17  0 - 40 mg/dL Final   LDL Cholesterol 07/30/2023 74  0 - 99 mg/dL Final   Comment:        Total Cholesterol/HDL:CHD  Risk Coronary Heart Disease Risk Table                     Men   Women  1/2 Average Risk   3.4   3.3  Average Risk       5.0   4.4  2 X Average Risk   9.6   7.1  3 X Average Risk  23.4   11.0        Use the calculated Patient Ratio above and the CHD Risk Table to determine the patient's CHD Risk.        ATP III CLASSIFICATION (LDL):  <100     mg/dL   Optimal  899-870  mg/dL   Near or Above                    Optimal  130-159  mg/dL   Borderline  839-810  mg/dL   High  >809     mg/dL   Very High Performed at [facility], [address] [place], [place] [address]    Hepatitis B Surface Ag 07/30/2023 NON REACTIVE  NON REACTIVE Final   HCV Ab 07/30/2023 Reactive (A)  NON REACTIVE Final   Comment: (NOTE) The CDC recommends that a Reactive HCV antibody result be followed up  with a HCV Nucleic Acid Amplification test.     Hep A IgM 07/30/2023 NON REACTIVE  NON REACTIVE Final   Hep B C IgM 07/30/2023 NON REACTIVE  NON REACTIVE Final   Performed at [facility], [address], [place], [place] [address]   Valproic Acid  Lvl 07/30/2023 58  50.0 - 100.0 ug/mL Final   Performed at [facility], [address] [place], [place] [address]   Iron 07/30/2023 48  45 - 182 ug/dL Final   TIBC 88/69/7975 421  250 - 450 ug/dL Final   Saturation Ratios 07/30/2023 11 (L)  17.9 - 39.5 % Final   UIBC 07/30/2023 373  ug/dL Final   Performed at [facility], [address] [place], [place] [address]  Admission on 07/28/2023, Discharged on 07/29/2023  Component Date Value Ref Range Status   Sodium 07/28/2023 138  135 - 145  mmol/L Final   Potassium 07/28/2023 3.5  3.5 - 5.1 mmol/L Final   Chloride 07/28/2023 107  98 - 111 mmol/L Final   CO2 07/28/2023 26  22 - 32 mmol/L Final   Glucose, Bld 07/28/2023 91  70 - 99 mg/dL Final   Glucose reference range applies only to samples taken after fasting for at least 8 hours.   BUN 07/28/2023 14  6 - 20 mg/dL Final   Creatinine, Ser 07/28/2023 0.74  0.61 - 1.24 mg/dL Final   Calcium 88/71/7975 8.7 (L)  8.9 - 10.3 mg/dL Final   Total Protein 88/71/7975 7.0  6.5 - 8.1 g/dL Final   Albumin 88/71/7975 3.8  3.5 - 5.0 g/dL Final   AST 88/71/7975 14 (L)  15 - 41 U/L Final   ALT 07/28/2023 16  0 - 44 U/L Final   Alkaline Phosphatase 07/28/2023 38  38 - 126 U/L Final   Total Bilirubin 07/28/2023 1.1  <1.2 mg/dL Final   GFR, Estimated 07/28/2023 >60  >60 mL/min Final   Comment: (NOTE) Calculated using the CKD-EPI Creatinine Equation (2021)    Anion gap 07/28/2023 5  5 - 15 Final   Performed at [facility], [address], [place], [place] [address]  Alcohol, Ethyl (B) 07/28/2023 <10  <10 mg/dL Final   Comment: (NOTE) Lowest detectable limit for serum alcohol is 10 mg/dL.  For medical purposes only. Performed at [facility], [address] [place], [place] [address]    Opiates 07/28/2023 NONE DETECTED  NONE DETECTED Final   Cocaine 07/28/2023 POSITIVE (A)  NONE DETECTED Final   Benzodiazepines 07/28/2023 POSITIVE (A)  NONE DETECTED Final   Amphetamines 07/28/2023 NONE DETECTED  NONE DETECTED Final   Tetrahydrocannabinol 07/28/2023 POSITIVE (A)  NONE DETECTED Final   Barbiturates 07/28/2023 NONE DETECTED  NONE DETECTED Final   Comment: (NOTE) DRUG SCREEN FOR MEDICAL PURPOSES ONLY.  IF CONFIRMATION IS NEEDED FOR ANY PURPOSE, NOTIFY LAB WITHIN 5 DAYS.  LOWEST DETECTABLE LIMITS FOR URINE DRUG SCREEN Drug Class                     Cutoff (ng/mL) Amphetamine and metabolites    1000 Barbiturate and metabolites    200 Benzodiazepine                 200 Opiates and  metabolites        300 Cocaine and metabolites        300 THC                            50 Performed at [facility], [address] [place], [place] [address]    WBC 07/28/2023 4.9  4.0 - 10.5 K/uL Final   RBC 07/28/2023 5.03  4.22 - 5.81 MIL/uL Final   Hemoglobin 07/28/2023 12.8 (L)  13.0 - 17.0 g/dL Final   HCT 88/71/7975 39.7  39.0 - 52.0 % Final   MCV 07/28/2023 78.9 (L)  80.0 - 100.0 fL Final   MCH 07/28/2023 25.4 (L)  26.0 - 34.0 pg Final   MCHC 07/28/2023 32.2  30.0 - 36.0 g/dL Final   RDW 88/71/7975 13.4  11.5 - 15.5 % Final   Platelets 07/28/2023 148 (L)  150 - 400 K/uL Final   nRBC 07/28/2023 0.0  0.0 - 0.2 % Final   Neutrophils Relative % 07/28/2023 52  % Final   Neutro Abs 07/28/2023 2.5  1.7 - 7.7 K/uL Final   Lymphocytes Relative 07/28/2023 38  % Final   Lymphs Abs 07/28/2023 1.8  0.7 - 4.0 K/uL Final   Monocytes Relative 07/28/2023 8  % Final   Monocytes Absolute 07/28/2023 0.4  0.1 - 1.0 K/uL Final   Eosinophils Relative 07/28/2023 2  % Final   Eosinophils Absolute 07/28/2023 0.1  0.0 - 0.5 K/uL Final   Basophils Relative 07/28/2023 0  % Final   Basophils Absolute 07/28/2023 0.0  0.0 - 0.1 K/uL Final   Immature Granulocytes 07/28/2023 0  % Final   Abs Immature Granulocytes 07/28/2023 0.01  0.00 - 0.07 K/uL Final   Performed at [facility], [address], [place], [place] [address]   Salicylate Lvl 07/28/2023 <7.0 (L)  7.0 - 30.0 mg/dL Final   Performed at [facility], [address] [place], [place] [address]   Acetaminophen  (Tylenol ), Serum 07/28/2023 <10 (L)  10 - 30 ug/mL Final   Comment: (NOTE) Therapeutic concentrations vary significantly. A range of 10-30 ug/mL  may be an effective concentration for many patients. However, some  are best treated at concentrations outside of this range. Acetaminophen  concentrations >150 ug/mL at 4 hours after ingestion  and >50 ug/mL at 12 hours after ingestion are often associated  with  toxic reactions.  Performed at  [facility], [address] [place], [place] [address]   Admission on 05/17/2023, Discharged on 05/17/2023  Component Date Value Ref Range Status   Sodium 05/17/2023 136  135 - 145 mmol/L Final   Potassium 05/17/2023 3.1 (L)  3.5 - 5.1 mmol/L Final   Chloride 05/17/2023 99  98 - 111 mmol/L Final   CO2 05/17/2023 28  22 - 32 mmol/L Final   Glucose, Bld 05/17/2023 90  70 - 99 mg/dL Final   Glucose reference range applies only to samples taken after fasting for at least 8 hours.   BUN 05/17/2023 12  6 - 20 mg/dL Final   Creatinine, Ser 05/17/2023 0.83  0.61 - 1.24 mg/dL Final   Calcium 90/82/7975 8.5 (L)  8.9 - 10.3 mg/dL Final   Total Protein 90/82/7975 7.3  6.5 - 8.1 g/dL Final   Albumin 90/82/7975 4.0  3.5 - 5.0 g/dL Final   AST 90/82/7975 18  15 - 41 U/L Final   ALT 05/17/2023 19  0 - 44 U/L Final   Alkaline Phosphatase 05/17/2023 45  38 - 126 U/L Final   Total Bilirubin 05/17/2023 1.1  0.3 - 1.2 mg/dL Final   GFR, Estimated 05/17/2023 >60  >60 mL/min Final   Comment: (NOTE) Calculated using the CKD-EPI Creatinine Equation (2021)    Anion gap 05/17/2023 9  5 - 15 Final   Performed at [facility], [address], [place], [place] [address]   Alcohol, Ethyl (B) 05/17/2023 <10  <10 mg/dL Final   Comment: (NOTE) Lowest detectable limit for serum alcohol is 10 mg/dL.  For medical purposes only. Performed at [facility], [address] [place], [place] [address]    Salicylate Lvl 05/17/2023 <7.0 (L)  7.0 - 30.0 mg/dL Final   Performed at [facility], [address] [place], [place] [address]   Acetaminophen  (Tylenol ), Serum 05/17/2023 <10 (L)  10 - 30 ug/mL Final   Comment: (NOTE) Therapeutic concentrations vary significantly. A range of 10-30 ug/mL  may be an effective concentration for many patients. However, some  are best treated at concentrations outside of this range. Acetaminophen  concentrations >150 ug/mL at 4 hours after ingestion  and >50 ug/mL at 12 hours after  ingestion are often associated with  toxic reactions.  Performed at [facility], [address], [place], [place] [address]    WBC 05/17/2023 6.3  4.0 - 10.5 K/uL Final   RBC 05/17/2023 4.95  4.22 - 5.81 MIL/uL Final   Hemoglobin 05/17/2023 12.6 (L)  13.0 - 17.0 g/dL Final   HCT 90/82/7975 39.2  39.0 - 52.0 % Final   MCV 05/17/2023 79.2 (L)  80.0 - 100.0 fL Final   MCH 05/17/2023 25.5 (L)  26.0 - 34.0 pg Final   MCHC 05/17/2023 32.1  30.0 - 36.0 g/dL Final   RDW 90/82/7975 14.1  11.5 - 15.5 % Final   Platelets 05/17/2023 156  150 - 400 K/uL Final   nRBC 05/17/2023 0.0  0.0 - 0.2 % Final   Performed at [facility], [address], [place], [place] [address]   Opiates 05/17/2023 NONE DETECTED  NONE DETECTED Final   Cocaine 05/17/2023 POSITIVE (A)  NONE DETECTED Final   Benzodiazepines 05/17/2023 NONE DETECTED  NONE DETECTED Final   Amphetamines 05/17/2023 NONE DETECTED  NONE DETECTED Final   Tetrahydrocannabinol 05/17/2023 POSITIVE (A)  NONE DETECTED Final   Barbiturates 05/17/2023 NONE DETECTED  NONE DETECTED Final   Comment: (NOTE)  DRUG SCREEN FOR MEDICAL PURPOSES ONLY.  IF CONFIRMATION IS NEEDED FOR ANY PURPOSE, NOTIFY LAB WITHIN 5 DAYS.  LOWEST DETECTABLE LIMITS FOR URINE DRUG SCREEN Drug Class                     Cutoff (ng/mL) Amphetamine and metabolites    1000 Barbiturate and metabolites    200 Benzodiazepine                 200 Opiates and metabolites        300 Cocaine and metabolites        300 THC                            50 Performed at [facility], [address], [place], [place] [address]     Blood Alcohol level:  Lab Results  Component Value Date   [facility] <10 10/02/2023   ETH <10 08/07/2023    Metabolic Disorder Labs: Lab Results  Component Value Date   HGBA1C 5.5 07/30/2023   MPG 111.15 07/30/2023   MPG 105.41 02/21/2023   No results found for: PROLACTIN Lab Results  Component Value Date   CHOL 147 07/30/2023   TRIG 83 07/30/2023   HDL 56  07/30/2023   CHOLHDL 2.6 07/30/2023   VLDL 17 07/30/2023   LDLCALC 74 07/30/2023   LDLCALC 69 02/21/2023    Therapeutic Lab Levels: No results found for: LITHIUM Lab Results  Component Value Date   VALPROATE 58 07/30/2023   Lab Results  Component Value Date   CBMZ 7.4 07/24/2013    Physical Findings   AIMS    Flowsheet Row [facility]) [facility] Ad[facility] (Dis[facility]) [facility] Admission (Discharged) from 07/16/2017 in [facility]PAT[facility] BE[facility]ICINE Admission (Discharged) [facility] Ad[facility] (Discharged) [facility]  AIMS Total Score 0 9 0 0 0      AUDIT    Flowsheet Row [facility]) [facility] Admission (Discharged) [facility] Admission (Discharged) from 08/13/2015 in [facility]PAT[facility] BE[facility] MEDICINE Admission (Discharged) [facility] Admission (Discharged) [facility]  Alcohol Use Disorder Identification Test Final Score (AUDIT) 16 16 0 0 0      PHQ2-9    Flowsheet Row ED from 10/02/2023 in [facility]  PHQ-2 Total Score 0      Flowsheet Row ED from 10/02/2023 in [facility] Most recent reading at 10/02/2023  3:28 PM ED from 10/02/2023 in [facility] at [facility] Most recent reading at 10/02/2023  4:46 AM ED from 09/12/2023 in [facility] Emergency Department at [facility] Most recent reading at 09/12/2023 10:41 PM  C-SSRS RISK CATEGORY Low Risk Moderate Risk Error: Q3, 4, or 5 should not be populated when Q2 is No        Musculoskeletal   Strength & Muscle Tone: within normal limits Gait & Station: normal Patient leans: N/A  Psychiatric Specialty Exam  Presentation  General Appearance:  Disheveled  Eye Contact: Fair  Speech: Clear and Coherent; Normal Rate  Speech Volume: Normal  Handedness: Right   Mood and Affect  Mood: Euthymic  Affect: Appropriate; Congruent   Thought Process  Thought Processes: Linear  Descriptions of Associations:Intact  Orientation:Full (Time, Place and Person)  Thought Content:Logical  Diagnosis of Schizophrenia or Schizoaffective disorder in past: No    Hallucinations:Hallucinations: None  Ideas of Reference:None  Suicidal Thoughts: Denies SI, denies plan or intent Homicidal Thoughts:Homicidal Thoughts: No   Sensorium  Memory: Recent Fair; Immediate Fair; Remote Fair  Judgment: Impaired  Insight: fair   Executive Functions  Concentration: Fair  Attention Span: Fair  Recall: Fair  Fund of Knowledge: Fair  Language: Fair   Psychomotor Activity  Psychomotor Activity: Psychomotor Activity: Normal   Assets  Assets: Desire for Improvement; Communication Skills   Sleep  Sleep: Sleep: Fair Number of Hours of Sleep: 6   Nutritional Assessment (For OBS and [facility]BC admissions only) Has the patient had a weight loss or gain of 10 pounds or more in the last 3 months?: No Has the patient had a decrease in food intake/or appetite?: No Does the patient have dental problems?: No Does the patient have eating habits or behaviors that may be indicators of an eating disorder including binging or inducing vomiting?: No Has the patient recently lost weight without trying?: 0 Has the patient been eating poorly because of a decreased appetite?: 0 Malnutrition Screening Tool Score: 0    Physical Exam  Physical Exam Vitals and nursing note reviewed.  HENT:     Nose: No congestion.  Cardiovascular:     Rate and Rhythm: Normal rate.  Pulmonary:      Effort: No respiratory distress.  Skin:    General: Skin is warm and dry.  Neurological:     Mental Status: He is alert.  Psychiatric:        Attention and Perception: He does not perceive auditory or visual hallucinations.        Mood and Affect: Mood normal.        Speech: Speech normal.        Behavior: Behavior normal. Behavior is cooperative.        Thought Content: Thought content is not paranoid. Thought content does not include homicidal or suicidal ideation.    Review of Systems  Constitutional:  Negative for chills and fever.  Cardiovascular:  Negative for chest pain and palpitations.  Gastrointestinal:  Negative for nausea and vomiting.  Neurological:  Negative for dizziness and headaches.  Psychiatric/Behavioral:  Positive for substance abuse. Negative for depression, hallucinations and suicidal ideas.    Blood pressure 99/65, pulse 73, temperature 97.7 F (36.5 C), temperature source Oral, resp. rate 18, height 5' 9 (1.753 m), weight 82.6 kg, SpO2 100%. Body mass index is 26.88 kg/m.  Treatment Plan Summary: Daily contact with patient to assess and evaluate symptoms and progress in treatment  Resume home Suboxone  8mg  po BID. PDMP verified, last filled 09/30/2023 #14 day supply.  - provided 3 day script and no script for Xanax  as patient was offered continued detox on our [facility] unit but is requesting discharge  Start Aprazolam taper 0.5 mg TID, PDMP verified, last filled 09/15/2023 #90, (30 day supply)   -cont gabapentin  200mg  po TID. -cont Depakote  500mg  po BID -Risperdal  1mg  po BID -Continue thiamine  for AUD.  -Continue ativan  detox protocol.  -discharge to shelter per patient request as he does not meet IVC criteria. Denies SI or HI, acute risk for suicide is low based on [place] suicide risk assessment today.   Murline Weigel, MD 10/04/2023 3:40 PM

## 2023-10-04 NOTE — Discharge Instructions (Addendum)

## 2023-10-04 NOTE — ED Provider Notes (Incomplete)
[facility]/OBS ASAP Discharge Summary  Date and Time: 10/04/2023 4:02 PM  Name: Cole [name]  MRN:  [id number]   Discharge Diagnoses:  Final diagnoses:  Bipolar affective disorder, currently depressed, moderate (HCC)  Opioid use disorder in remission    Subjective: ***  Stay Summary: ***  Total Time spent with patient: {Time; 15 min - 8 hours:17441}  Past Psychiatric History: *** Past Medical History: *** Family History: *** Family Psychiatric History: *** Social History: *** Tobacco Cessation:  {Discharge tobacco cessation prescription:304700209}  Current Medications:  Current Facility-Administered Medications  Medication Dose Route Frequency Provider Last Rate Last Admin   ALPRAZolam Prudy Feeler) tablet 0.5 mg  0.5 mg Oral TID Ardis Hughs, NP   0.5 mg at 10/04/23 1016   alum & mag hydroxide-simeth (MAALOX/MYLANTA) 200-200-20 MG/5ML suspension 30 mL  30 mL Oral Q4H PRN Eligha Bridegroom, NP       buprenorphine (SUBUTEX) sublingual tablet 8 mg  8 mg Sublingual Daily Vernard Gambles H, NP   8 mg at 10/04/23 1016   divalproex (DEPAKOTE) DR tablet 500 mg  500 mg Oral Q12H Eligha Bridegroom, NP   500 mg at 10/04/23 4782   gabapentin (NEURONTIN) capsule 200 mg  200 mg Oral TID Eligha Bridegroom, NP   200 mg at 10/04/23 9562   haloperidol (HALDOL) tablet 5 mg  5 mg Oral Q6H PRN Eligha Bridegroom, NP       Or   haloperidol lactate (HALDOL) injection 10 mg  10 mg Intramuscular Q6H PRN Eligha Bridegroom, NP       hydrOXYzine (ATARAX) tablet 25 mg  25 mg Oral Q6H PRN Eligha Bridegroom, NP       loperamide (IMODIUM) capsule 2-4 mg  2-4 mg Oral PRN Eligha Bridegroom, NP       LORazepam (ATIVAN) tablet 1 mg  1 mg Oral Q6H PRN Eligha Bridegroom, NP       Or   LORazepam (ATIVAN) injection 2 mg  2 mg Intramuscular Q6H PRN Eligha Bridegroom, NP       LORazepam (ATIVAN) tablet 1 mg  1 mg Oral Q6H PRN Eligha Bridegroom, NP       magnesium hydroxide (MILK OF MAGNESIA) suspension 30 mL  30 mL Oral Daily PRN  Eligha Bridegroom, NP       multivitamin with minerals tablet 1 tablet  1 tablet Oral Daily Eligha Bridegroom, NP   1 tablet at 10/04/23 1308   nicotine (NICODERM CQ - dosed in mg/24 hours) patch 21 mg  21 mg Transdermal Once PRN Eligha Bridegroom, NP       nicotine polacrilex (NICORETTE) gum 2 mg  2 mg Oral PRN Onuoha, Chinwendu V, NP   2 mg at 10/03/23 2127   ondansetron (ZOFRAN-ODT) disintegrating tablet 4 mg  4 mg Oral Q6H PRN Eligha Bridegroom, NP       risperiDONE (RISPERDAL) tablet 1 mg  1 mg Oral BID Eligha Bridegroom, NP   1 mg at 10/04/23 0908   thiamine (VITAMIN B1) tablet 100 mg  100 mg Oral Daily Eligha Bridegroom, NP   100 mg at 10/04/23 0911   traZODone (DESYREL) tablet 100 mg  100 mg Oral QHS Eligha Bridegroom, NP   100 mg at 10/03/23 2127   Current Outpatient Medications  Medication Sig Dispense Refill   divalproex (DEPAKOTE) 500 MG DR tablet Take 1 tablet (500 mg total) by mouth every 12 (twelve) hours. 60 tablet 0   gabapentin (NEURONTIN) 100 MG capsule Take 2 capsules (200 mg total) by  mouth 3 (three) times daily. 180 capsule 0   hydrOXYzine (ATARAX) 25 MG tablet Take 1 tablet (25 mg total) by mouth every 6 (six) hours as needed (anxiety/agitation or CIWA < or = 10). 30 tablet 0   nicotine (NICODERM CQ - DOSED IN MG/24 HOURS) 21 mg/24hr patch Place 1 patch (21 mg total) onto the skin once as needed (nicotine withdrawal). 28 patch 0   risperiDONE (RISPERDAL) 1 MG tablet Take 1 tablet (1 mg total) by mouth 2 (two) times daily. 60 tablet 0   SUBOXONE 8-2 MG FILM Place 1 Film under the tongue 2 (two) times daily for 3 days. 6 Film 0    PTA Medications:  PTA Medications  Medication Sig   nicotine (NICODERM CQ - DOSED IN MG/24 HOURS) 21 mg/24hr patch Place 1 patch (21 mg total) onto the skin once as needed (nicotine withdrawal).   hydrOXYzine (ATARAX) 25 MG tablet Take 1 tablet (25 mg total) by mouth every 6 (six) hours as needed (anxiety/agitation or CIWA < or = 10).   gabapentin  (NEURONTIN) 100 MG capsule Take 2 capsules (200 mg total) by mouth 3 (three) times daily.   divalproex (DEPAKOTE) 500 MG DR tablet Take 1 tablet (500 mg total) by mouth every 12 (twelve) hours.   risperiDONE (RISPERDAL) 1 MG tablet Take 1 tablet (1 mg total) by mouth 2 (two) times daily.   SUBOXONE 8-2 MG FILM Place 1 Film under the tongue 2 (two) times daily for 3 days.   Facility Ordered Medications  Medication   [COMPLETED] buprenorphine-naloxone (SUBOXONE) 8-2 mg per SL tablet 1 tablet   divalproex (DEPAKOTE) DR tablet 500 mg   gabapentin (NEURONTIN) capsule 200 mg   nicotine (NICODERM CQ - dosed in mg/24 hours) patch 21 mg   risperiDONE (RISPERDAL) tablet 1 mg   traZODone (DESYREL) tablet 100 mg   alum & mag hydroxide-simeth (MAALOX/MYLANTA) 200-200-20 MG/5ML suspension 30 mL   magnesium hydroxide (MILK OF MAGNESIA) suspension 30 mL   thiamine (VITAMIN B1) tablet 100 mg   multivitamin with minerals tablet 1 tablet   LORazepam (ATIVAN) tablet 1 mg   hydrOXYzine (ATARAX) tablet 25 mg   loperamide (IMODIUM) capsule 2-4 mg   ondansetron (ZOFRAN-ODT) disintegrating tablet 4 mg   haloperidol (HALDOL) tablet 5 mg   Or   haloperidol lactate (HALDOL) injection 10 mg   LORazepam (ATIVAN) tablet 1 mg   Or   LORazepam (ATIVAN) injection 2 mg   [COMPLETED] nicotine polacrilex (NICORETTE) gum 2 mg   nicotine polacrilex (NICORETTE) gum 2 mg   buprenorphine (SUBUTEX) sublingual tablet 8 mg   ALPRAZolam (XANAX) tablet 0.5 mg       10/04/2023    4:00 PM 10/04/2023    3:58 PM 10/03/2023    4:50 PM  Depression screen PHQ 2/9  Decreased Interest 0 2 0  Down, Depressed, Hopeless 0 2 0  PHQ - 2 Score 0 4 0  Altered sleeping  2   Tired, decreased energy  2   Change in appetite  2   Feeling bad or failure about yourself   2   Trouble concentrating  2   Moving slowly or fidgety/restless  2   Suicidal thoughts  2   PHQ-9 Score  18     Flowsheet Row ED from 10/02/2023 in [facility] Most recent reading at 10/02/2023  3:28 PM ED from 10/02/2023 in [facility] Emergency Department at [facility] Most recent reading at 10/02/2023  4:46 AM ED from 09/12/2023 in [facility] Emergency Department at [facility] Most recent reading at 09/12/2023 10:41 PM  C-SSRS RISK CATEGORY Low Risk Moderate Risk Error: Q3, 4, or 5 should not be populated when Q2 is No       Musculoskeletal  Strength & Muscle Tone: {desc; muscle tone:32375} Gait & Station: {PE GAIT ED YNWG:95621} Patient leans: {Patient Leans:21022755}  Psychiatric Specialty Exam  Presentation  General Appearance:  Disheveled  Eye Contact: Fair  Speech: Clear and Coherent; Normal Rate  Speech Volume: Normal  Handedness: Right   Mood and Affect  Mood: Euthymic  Affect: Appropriate; Congruent   Thought Process  Thought Processes: Linear  Descriptions of Associations:Intact  Orientation:Full (Time, Place and Person)  Thought Content:Logical  Diagnosis of Schizophrenia or Schizoaffective disorder in past: No    Hallucinations:Hallucinations: None  Ideas of Reference:None  Suicidal Thoughts:Suicidal Thoughts: No SI Active Intent and/or Plan: With Intent; With Plan; Without Means to Carry Out; Without Access to Means  Homicidal Thoughts:Homicidal Thoughts: No   Sensorium  Memory: Recent Fair; Immediate Fair; Remote Fair  Judgment: Impaired  Insight: Lacking   Executive Functions  Concentration: Fair  Attention Span: Fair  Recall: [employer] of Knowledge: Fair  Language: Fair   Psychomotor Activity  Psychomotor Activity: Psychomotor Activity: Normal   Assets  Assets: Desire for Improvement; Communication Skills   Sleep  Sleep: Sleep: Fair Number of Hours of Sleep: 6   Nutritional Assessment (For OBS and FBC admissions only) Has the patient had a weight loss or gain of 10 pounds or more in the last 3 months?: No Has the  patient had a decrease in food intake/or appetite?: No Does the patient have dental problems?: No Does the patient have eating habits or behaviors that may be indicators of an eating disorder including binging or inducing vomiting?: No Has the patient recently lost weight without trying?: 0 Has the patient been eating poorly because of a decreased appetite?: 0 Malnutrition Screening Tool Score: 0    Physical Exam  Physical Exam ROS Blood pressure 99/65, pulse 73, temperature 97.7 F (36.5 C), temperature source Oral, resp. rate 18, height 5\' 9"  (1.753 m), weight 82.6 kg, SpO2 100%. Body mass index is 26.88 kg/m.  Demographic Factors:  {Demographic Factors:20662}  Loss Factors: {Loss Factors:20659}  Historical Factors: {Historical Factors:20660}  Risk Reduction Factors:   {Risk Reduction Factors:20661}  Continued Clinical Symptoms:  {Clinical Factors:22706}  Cognitive Features That Contribute To Risk:  {chl bhh Cognitive Features:304700251}    Suicide Risk:  {[facility] SUICIDE HYQM:57846}  Plan Of Care/Follow-up recommendations:  {[facility] DC FU RECOMMENDATIONS:22620}  Disposition: ***  Miguel Rota, MD 10/04/2023, 4:02 PM

## 2023-10-04 NOTE — ED Provider Notes (Incomplete)
Behavioral Health Progress Note  Date and Time: 10/04/2023 9:41 AM Name: Cole [name] MRN:  [id number]  Subjective:  ***  Diagnosis:  Final diagnoses:  None    Total Time spent with patient: {Time; 15 min - 8 hours:17441}  Past Psychiatric History: *** Past Medical History: *** Family History: *** Family Psychiatric  History: *** Social History: ***  Additional Social History:                         Sleep: {BHH GOOD/FAIR/POOR:22877}  Appetite:  {BHH GOOD/FAIR/POOR:22877}  Current Medications:  Current Facility-Administered Medications  Medication Dose Route Frequency Provider Last Rate Last Admin   ALPRAZolam Prudy Feeler) tablet 0.5 mg  0.5 mg Oral TID Ardis Hughs, NP       alum & mag hydroxide-simeth (MAALOX/MYLANTA) 200-200-20 MG/5ML suspension 30 mL  30 mL Oral Q4H PRN Eligha Bridegroom, NP       buprenorphine (SUBUTEX) sublingual tablet 8 mg  8 mg Sublingual Daily Vernard Gambles H, NP       divalproex (DEPAKOTE) DR tablet 500 mg  500 mg Oral Q12H Eligha Bridegroom, NP   500 mg at 10/04/23 4098   gabapentin (NEURONTIN) capsule 200 mg  200 mg Oral TID Eligha Bridegroom, NP   200 mg at 10/04/23 1191   haloperidol (HALDOL) tablet 5 mg  5 mg Oral Q6H PRN Eligha Bridegroom, NP       Or   haloperidol lactate (HALDOL) injection 10 mg  10 mg Intramuscular Q6H PRN Eligha Bridegroom, NP       hydrOXYzine (ATARAX) tablet 25 mg  25 mg Oral Q6H PRN Eligha Bridegroom, NP       loperamide (IMODIUM) capsule 2-4 mg  2-4 mg Oral PRN Eligha Bridegroom, NP       LORazepam (ATIVAN) tablet 1 mg  1 mg Oral Q6H PRN Eligha Bridegroom, NP       Or   LORazepam (ATIVAN) injection 2 mg  2 mg Intramuscular Q6H PRN Eligha Bridegroom, NP       LORazepam (ATIVAN) tablet 1 mg  1 mg Oral Q6H PRN Eligha Bridegroom, NP       magnesium hydroxide (MILK OF MAGNESIA) suspension 30 mL  30 mL Oral Daily PRN Eligha Bridegroom, NP       multivitamin with minerals tablet 1 tablet  1 tablet Oral Daily  Eligha Bridegroom, NP   1 tablet at 10/04/23 4782   nicotine (NICODERM CQ - dosed in mg/24 hours) patch 21 mg  21 mg Transdermal Once PRN Eligha Bridegroom, NP       nicotine polacrilex (NICORETTE) gum 2 mg  2 mg Oral PRN Onuoha, Chinwendu V, NP   2 mg at 10/03/23 2127   ondansetron (ZOFRAN-ODT) disintegrating tablet 4 mg  4 mg Oral Q6H PRN Eligha Bridegroom, NP       risperiDONE (RISPERDAL) tablet 1 mg  1 mg Oral BID Eligha Bridegroom, NP   1 mg at 10/04/23 9562   thiamine (VITAMIN B1) tablet 100 mg  100 mg Oral Daily Eligha Bridegroom, NP   100 mg at 10/04/23 0911   traZODone (DESYREL) tablet 100 mg  100 mg Oral QHS Eligha Bridegroom, NP   100 mg at 10/03/23 2127   Current Outpatient Medications  Medication Sig Dispense Refill   ALPRAZolam (XANAX) 1 MG tablet Take 1 mg by mouth 3 (three) times daily.     baclofen (LIORESAL) 10 MG tablet Take 10 mg by mouth 3 (three)  times daily as needed.     divalproex (DEPAKOTE) 500 MG DR tablet Take 1 tablet (500 mg total) by mouth every 12 (twelve) hours. 60 tablet 0   gabapentin (NEURONTIN) 800 MG tablet Take 800 mg by mouth 3 (three) times daily.     nicotine (NICODERM CQ - DOSED IN MG/24 HOURS) 14 mg/24hr patch Place 1 patch (14 mg total) onto the skin daily. 28 patch 0   nicotine polacrilex (NICORETTE) 2 MG gum Take 1 each (2 mg total) by mouth as needed for smoking cessation. 100 tablet 0   SUBOXONE 8-2 MG FILM Place 1 Film under the tongue 2 (two) times daily.     VRAYLAR 1.5 MG capsule Take 1.5 mg by mouth daily.     buprenorphine (SUBUTEX) 8 MG SUBL SL tablet Place 16 mg under the tongue daily.      Labs  Lab Results:  Admission on 10/02/2023, Discharged on 10/02/2023  Component Date Value Ref Range Status   Sodium 10/02/2023 138  135 - 145 mmol/L Final   Potassium 10/02/2023 3.7  3.5 - 5.1 mmol/L Final   Chloride 10/02/2023 103  98 - 111 mmol/L Final   CO2 10/02/2023 28  22 - 32 mmol/L Final   Glucose, Bld 10/02/2023 82  70 - 99 mg/dL Final    Glucose reference range applies only to samples taken after fasting for at least 8 hours.   BUN 10/02/2023 24 (H)  6 - 20 mg/dL Final   Creatinine, Ser 10/02/2023 0.82  0.61 - 1.24 mg/dL Final   Calcium 46/96/2952 8.7 (L)  8.9 - 10.3 mg/dL Final   Total Protein 84/13/2440 7.0  6.5 - 8.1 g/dL Final   Albumin 06/26/2535 3.7  3.5 - 5.0 g/dL Final   AST 64/40/3474 34  15 - 41 U/L Final   ALT 10/02/2023 24  0 - 44 U/L Final   Alkaline Phosphatase 10/02/2023 46  38 - 126 U/L Final   Total Bilirubin 10/02/2023 0.8  0.0 - 1.2 mg/dL Final   GFR, Estimated 10/02/2023 >60  >60 mL/min Final   Comment: (NOTE) Calculated using the CKD-EPI Creatinine Equation (2021)    Anion gap 10/02/2023 7  5 - 15 Final   Performed at [facility], [address] [place], [place] [address]   Alcohol, Ethyl (B) 10/02/2023 <10  <10 mg/dL Final   Comment: (NOTE) Lowest detectable limit for serum alcohol is 10 mg/dL.  For medical purposes only. Performed at [facility], [address], [place], [place] [address]    Salicylate Lvl 10/02/2023 <7.0 (L)  7.0 - 30.0 mg/dL Final   Performed at [facility], [address], [place], [place] [address]   Acetaminophen (Tylenol), Serum 10/02/2023 <10 (L)  10 - 30 ug/mL Final   Comment: (NOTE) Therapeutic concentrations vary significantly. A range of 10-30 ug/mL  may be an effective concentration for many patients. However, some  are best treated at concentrations outside of this range. Acetaminophen concentrations >150 ug/mL at 4 hours after ingestion  and >50 ug/mL at 12 hours after ingestion are often associated with  toxic reactions.  Performed at [facility], [address], [place], [place] [address]    WBC 10/02/2023 4.8  4.0 - 10.5 K/uL Final   RBC 10/02/2023 4.92  4.22 - 5.81 MIL/uL Final   Hemoglobin 10/02/2023 12.8 (L)  13.0 - 17.0 g/dL Final   HCT 16/60/6301 39.2  39.0 - 52.0 % Final   MCV 10/02/2023 79.7 (L)  80.0 -  100.0 fL Final   MCH 10/02/2023 26.0  26.0  - 34.0 pg Final   MCHC 10/02/2023 32.7  30.0 - 36.0 g/dL Final   RDW 16/05/9603 13.5  11.5 - 15.5 % Final   Platelets 10/02/2023 162  150 - 400 K/uL Final   nRBC 10/02/2023 0.0  0.0 - 0.2 % Final   Performed at [facility], [address] [place], [place] [address]   Opiates 10/02/2023 NONE DETECTED  NONE DETECTED Final   Cocaine 10/02/2023 POSITIVE (A)  NONE DETECTED Final   Benzodiazepines 10/02/2023 NONE DETECTED  NONE DETECTED Final   Amphetamines 10/02/2023 NONE DETECTED  NONE DETECTED Final   Tetrahydrocannabinol 10/02/2023 POSITIVE (A)  NONE DETECTED Final   Barbiturates 10/02/2023 NONE DETECTED  NONE DETECTED Final   Comment: (NOTE) DRUG SCREEN FOR MEDICAL PURPOSES ONLY.  IF CONFIRMATION IS NEEDED FOR ANY PURPOSE, NOTIFY LAB WITHIN 5 DAYS.  LOWEST DETECTABLE LIMITS FOR URINE DRUG SCREEN Drug Class                     Cutoff (ng/mL) Amphetamine and metabolites    1000 Barbiturate and metabolites    200 Benzodiazepine                 200 Opiates and metabolites        300 Cocaine and metabolites        300 THC                            50 Performed at [facility], [address], [place], [place] [address]   Admission on 08/07/2023, Discharged on 08/07/2023  Component Date Value Ref Range Status   Sodium 08/07/2023 137  135 - 145 mmol/L Final   Potassium 08/07/2023 3.7  3.5 - 5.1 mmol/L Final   Chloride 08/07/2023 99  98 - 111 mmol/L Final   CO2 08/07/2023 29  22 - 32 mmol/L Final   Glucose, Bld 08/07/2023 69 (L)  70 - 99 mg/dL Final   Glucose reference range applies only to samples taken after fasting for at least 8 hours.   BUN 08/07/2023 17  6 - 20 mg/dL Final   Creatinine, Ser 08/07/2023 0.88  0.61 - 1.24 mg/dL Final   Calcium 78/29/5621 9.4  8.9 - 10.3 mg/dL Final   Total Protein 30/86/5784 8.1  6.5 - 8.1 g/dL Final   Albumin 69/62/9528 4.5  3.5 - 5.0 g/dL Final   AST 41/32/4401 21  15 - 41 U/L Final   ALT 08/07/2023 27  0 - 44 U/L Final   Alkaline Phosphatase  08/07/2023 47  38 - 126 U/L Final   Total Bilirubin 08/07/2023 0.8  <1.2 mg/dL Final   GFR, Estimated 08/07/2023 >60  >60 mL/min Final   Comment: (NOTE) Calculated using the CKD-EPI Creatinine Equation (2021)    Anion gap 08/07/2023 9  5 - 15 Final   Performed at [facility], [address], [place], [place] [address]   Alcohol, Ethyl (B) 08/07/2023 <10  <10 mg/dL Final   Comment: (NOTE) Lowest detectable limit for serum alcohol is 10 mg/dL.  For medical purposes only. Performed at [facility], [address] [place], [place] [address]    WBC 08/07/2023 9.0  4.0 - 10.5 K/uL Final   RBC 08/07/2023 5.90 (H)  4.22 - 5.81 MIL/uL Final   Hemoglobin 08/07/2023 15.0  13.0 - 17.0 g/dL Final   HCT 03/47/4259 47.6  39.0 -  52.0 % Final   MCV 08/07/2023 80.7  80.0 - 100.0 fL Final   MCH 08/07/2023 25.4 (L)  26.0 - 34.0 pg Final   MCHC 08/07/2023 31.5  30.0 - 36.0 g/dL Final   RDW 16/05/9603 13.4  11.5 - 15.5 % Final   Platelets 08/07/2023 167  150 - 400 K/uL Final   nRBC 08/07/2023 0.0  0.0 - 0.2 % Final   Performed at [facility], [address] [place], [place] [address]  Admission on 07/29/2023, Discharged on 08/02/2023  Component Date Value Ref Range Status   Ferritin 07/30/2023 26  24 - 336 ng/mL Final   Performed at [facility], [address], [place], [place] [address]   Hgb A1c MFr Bld 07/30/2023 5.5  4.8 - 5.6 % Final   Comment: (NOTE) Pre diabetes:          5.7%-6.4%  Diabetes:              >6.4%  Glycemic control for   <7.0% adults with diabetes    Mean Plasma Glucose 07/30/2023 111.15  mg/dL Final   Performed at [facility], [address], [place], [place] [address]   TSH 07/30/2023 3.653  0.350 - 4.500 uIU/mL Final   Comment: Performed by a 3rd Generation assay with a functional sensitivity of <=0.01 uIU/mL. Performed at [facility], [address], [place], [place] [address]    Cholesterol 07/30/2023 147  0 - 200 mg/dL  Final   Triglycerides 07/30/2023 83  <150 mg/dL Final   HDL 86/57/8469 56  >40 mg/dL Final   Total CHOL/HDL Ratio 07/30/2023 2.6  RATIO Final   VLDL 07/30/2023 17  0 - 40 mg/dL Final   LDL Cholesterol 07/30/2023 74  0 - 99 mg/dL Final   Comment:        Total Cholesterol/HDL:CHD Risk Coronary Heart Disease Risk Table                     Men   Women  1/2 Average Risk   3.4   3.3  Average Risk       5.0   4.4  2 X Average Risk   9.6   7.1  3 X Average Risk  23.4   11.0        Use the calculated Patient Ratio above and the CHD Risk Table to determine the patient's CHD Risk.        ATP III CLASSIFICATION (LDL):  <100     mg/dL   Optimal  629-528  mg/dL   Near or Above                    Optimal  130-159  mg/dL   Borderline  413-244  mg/dL   High  >010     mg/dL   Very High Performed at [facility], [address] [place], [place] [address]    Hepatitis B Surface Ag 07/30/2023 NON REACTIVE  NON REACTIVE Final   HCV Ab 07/30/2023 Reactive (A)  NON REACTIVE Final   Comment: (NOTE) The CDC recommends that a Reactive HCV antibody result be followed up  with a HCV Nucleic Acid Amplification test.     Hep A IgM 07/30/2023 NON REACTIVE  NON REACTIVE Final   Hep B C IgM 07/30/2023 NON REACTIVE  NON REACTIVE Final   Performed at [facility], [address] [place], [place] [address]  Valproic Acid Lvl 07/30/2023 58  50.0 - 100.0 ug/mL Final   Performed at [facility], [address] [place], [place] [address]   Iron 07/30/2023 48  45 - 182 ug/dL Final   TIBC 82/95/6213 421  250 - 450 ug/dL Final   Saturation Ratios 07/30/2023 11 (L)  17.9 - 39.5 % Final   UIBC 07/30/2023 373  ug/dL Final   Performed at [facility], [address], [place], [place] [address]  Admission on 07/28/2023, Discharged on 07/29/2023  Component Date Value Ref Range Status   Sodium 07/28/2023 138  135 - 145 mmol/L Final   Potassium  07/28/2023 3.5  3.5 - 5.1 mmol/L Final   Chloride 07/28/2023 107  98 - 111 mmol/L Final   CO2 07/28/2023 26  22 - 32 mmol/L Final   Glucose, Bld 07/28/2023 91  70 - 99 mg/dL Final   Glucose reference range applies only to samples taken after fasting for at least 8 hours.   BUN 07/28/2023 14  6 - 20 mg/dL Final   Creatinine, Ser 07/28/2023 0.74  0.61 - 1.24 mg/dL Final   Calcium 84/69/6295 8.7 (L)  8.9 - 10.3 mg/dL Final   Total Protein 28/41/3244 7.0  6.5 - 8.1 g/dL Final   Albumin 09/01/7251 3.8  3.5 - 5.0 g/dL Final   AST 66/44/0347 14 (L)  15 - 41 U/L Final   ALT 07/28/2023 16  0 - 44 U/L Final   Alkaline Phosphatase 07/28/2023 38  38 - 126 U/L Final   Total Bilirubin 07/28/2023 1.1  <1.2 mg/dL Final   GFR, Estimated 07/28/2023 >60  >60 mL/min Final   Comment: (NOTE) Calculated using the CKD-EPI Creatinine Equation (2021)    Anion gap 07/28/2023 5  5 - 15 Final   Performed at [facility], [address], [place], [place] [address]   Alcohol, Ethyl (B) 07/28/2023 <10  <10 mg/dL Final   Comment: (NOTE) Lowest detectable limit for serum alcohol is 10 mg/dL.  For medical purposes only. Performed at [facility], [address], [place], [place] [address]    Opiates 07/28/2023 NONE DETECTED  NONE DETECTED Final   Cocaine 07/28/2023 POSITIVE (A)  NONE DETECTED Final   Benzodiazepines 07/28/2023 POSITIVE (A)  NONE DETECTED Final   Amphetamines 07/28/2023 NONE DETECTED  NONE DETECTED Final   Tetrahydrocannabinol 07/28/2023 POSITIVE (A)  NONE DETECTED Final   Barbiturates 07/28/2023 NONE DETECTED  NONE DETECTED Final   Comment: (NOTE) DRUG SCREEN FOR MEDICAL PURPOSES ONLY.  IF CONFIRMATION IS NEEDED FOR ANY PURPOSE, NOTIFY LAB WITHIN 5 DAYS.  LOWEST DETECTABLE LIMITS FOR URINE DRUG SCREEN Drug Class                     Cutoff (ng/mL) Amphetamine and metabolites    1000 Barbiturate and metabolites    200 Benzodiazepine                 200 Opiates and metabolites         300 Cocaine and metabolites        300 THC                            50 Performed at [facility], [address], [place], [place] [address]    WBC 07/28/2023 4.9  4.0 - 10.5 K/uL Final   RBC 07/28/2023 5.03  4.22 - 5.81 MIL/uL Final   Hemoglobin 07/28/2023 12.8 (  L)  13.0 - 17.0 g/dL Final   HCT 16/05/9603 39.7  39.0 - 52.0 % Final   MCV 07/28/2023 78.9 (L)  80.0 - 100.0 fL Final   MCH 07/28/2023 25.4 (L)  26.0 - 34.0 pg Final   MCHC 07/28/2023 32.2  30.0 - 36.0 g/dL Final   RDW 54/04/8118 13.4  11.5 - 15.5 % Final   Platelets 07/28/2023 148 (L)  150 - 400 K/uL Final   nRBC 07/28/2023 0.0  0.0 - 0.2 % Final   Neutrophils Relative % 07/28/2023 52  % Final   Neutro Abs 07/28/2023 2.5  1.7 - 7.7 K/uL Final   Lymphocytes Relative 07/28/2023 38  % Final   Lymphs Abs 07/28/2023 1.8  0.7 - 4.0 K/uL Final   Monocytes Relative 07/28/2023 8  % Final   Monocytes Absolute 07/28/2023 0.4  0.1 - 1.0 K/uL Final   Eosinophils Relative 07/28/2023 2  % Final   Eosinophils Absolute 07/28/2023 0.1  0.0 - 0.5 K/uL Final   Basophils Relative 07/28/2023 0  % Final   Basophils Absolute 07/28/2023 0.0  0.0 - 0.1 K/uL Final   Immature Granulocytes 07/28/2023 0  % Final   Abs Immature Granulocytes 07/28/2023 0.01  0.00 - 0.07 K/uL Final   Performed at [facility], [address], [place], [place] [address]   Salicylate Lvl 07/28/2023 <7.0 (L)  7.0 - 30.0 mg/dL Final   Performed at [facility], [address] [place], [place] [address]   Acetaminophen (Tylenol), Serum 07/28/2023 <10 (L)  10 - 30 ug/mL Final   Comment: (NOTE) Therapeutic concentrations vary significantly. A range of 10-30 ug/mL  may be an effective concentration for many patients. However, some  are best treated at concentrations outside of this range. Acetaminophen concentrations >150 ug/mL at 4 hours after ingestion  and >50 ug/mL at 12 hours after ingestion are often associated with  toxic reactions.  Performed at [facility],  [address] [place], [place] [address]   Admission on 05/17/2023, Discharged on 05/17/2023  Component Date Value Ref Range Status   Sodium 05/17/2023 136  135 - 145 mmol/L Final   Potassium 05/17/2023 3.1 (L)  3.5 - 5.1 mmol/L Final   Chloride 05/17/2023 99  98 - 111 mmol/L Final   CO2 05/17/2023 28  22 - 32 mmol/L Final   Glucose, Bld 05/17/2023 90  70 - 99 mg/dL Final   Glucose reference range applies only to samples taken after fasting for at least 8 hours.   BUN 05/17/2023 12  6 - 20 mg/dL Final   Creatinine, Ser 05/17/2023 0.83  0.61 - 1.24 mg/dL Final   Calcium 78/46/9629 8.5 (L)  8.9 - 10.3 mg/dL Final   Total Protein 52/84/1324 7.3  6.5 - 8.1 g/dL Final   Albumin 40/05/2724 4.0  3.5 - 5.0 g/dL Final   AST 36/64/4034 18  15 - 41 U/L Final   ALT 05/17/2023 19  0 - 44 U/L Final   Alkaline Phosphatase 05/17/2023 45  38 - 126 U/L Final   Total Bilirubin 05/17/2023 1.1  0.3 - 1.2 mg/dL Final   GFR, Estimated 05/17/2023 >60  >60 mL/min Final   Comment: (NOTE) Calculated using the CKD-EPI Creatinine Equation (2021)    Anion gap 05/17/2023 9  5 - 15 Final   Performed at [facility], [address], [place], [place] [address]   Alcohol, Ethyl (B) 05/17/2023 <10  <10 mg/dL Final   Comment: (NOTE) Lowest detectable limit for serum alcohol  is 10 mg/dL.  For medical purposes only. Performed at [facility], [address], [place], [place] [address]    Salicylate Lvl 05/17/2023 <7.0 (L)  7.0 - 30.0 mg/dL Final   Performed at [facility], [address] [place] [address], [place], [place] [address]   Acetaminophen (Tylenol), Serum 05/17/2023 <10 (L)  10 - 30 ug/mL Final   Comment: (NOTE) Therapeutic concentrations vary significantly. A range of 10-30 ug/mL  may be an effective concentration for many patients. However, some  are best treated at concentrations outside of this range. Acetaminophen concentrations >150 ug/mL at 4 hours after ingestion  and >50 ug/mL at 12 hours after ingestion are often  associated with  toxic reactions.  Performed at [facility], [address], [place], [place] [address]    WBC 05/17/2023 6.3  4.0 - 10.5 K/uL Final   RBC 05/17/2023 4.95  4.22 - 5.81 MIL/uL Final   Hemoglobin 05/17/2023 12.6 (L)  13.0 - 17.0 g/dL Final   HCT 91/47/8295 39.2  39.0 - 52.0 % Final   MCV 05/17/2023 79.2 (L)  80.0 - 100.0 fL Final   MCH 05/17/2023 25.5 (L)  26.0 - 34.0 pg Final   MCHC 05/17/2023 32.1  30.0 - 36.0 g/dL Final   RDW 62/13/0865 14.1  11.5 - 15.5 % Final   Platelets 05/17/2023 156  150 - 400 K/uL Final   nRBC 05/17/2023 0.0  0.0 - 0.2 % Final   Performed at [facility], [address], [place], [place] [address]   Opiates 05/17/2023 NONE DETECTED  NONE DETECTED Final   Cocaine 05/17/2023 POSITIVE (A)  NONE DETECTED Final   Benzodiazepines 05/17/2023 NONE DETECTED  NONE DETECTED Final   Amphetamines 05/17/2023 NONE DETECTED  NONE DETECTED Final   Tetrahydrocannabinol 05/17/2023 POSITIVE (A)  NONE DETECTED Final   Barbiturates 05/17/2023 NONE DETECTED  NONE DETECTED Final   Comment: (NOTE) DRUG SCREEN FOR MEDICAL PURPOSES ONLY.  IF CONFIRMATION IS NEEDED FOR ANY PURPOSE, NOTIFY LAB WITHIN 5 DAYS.  LOWEST DETECTABLE LIMITS FOR URINE DRUG SCREEN Drug Class                     Cutoff (ng/mL) Amphetamine and metabolites    1000 Barbiturate and metabolites    200 Benzodiazepine                 200 Opiates and metabolites        300 Cocaine and metabolites        300 THC                            50 Performed at [facility], [address] [place], [place] [address]     Blood Alcohol level:  Lab Results  Component Value Date   [facility] <10 10/02/2023   ETH <10 08/07/2023    Metabolic Disorder Labs: Lab Results  Component Value Date   HGBA1C 5.5 07/30/2023   MPG 111.15 07/30/2023   MPG 105.41 02/21/2023   No results found for: "PROLACTIN" Lab Results  Component Value Date   CHOL 147 07/30/2023   TRIG 83 07/30/2023   HDL 56 07/30/2023   CHOLHDL  2.6 07/30/2023   VLDL 17 07/30/2023   LDLCALC 74 07/30/2023   LDLCALC 69 02/21/2023    Therapeutic Lab Levels: No results found for: "LITHIUM" Lab Results  Component Value Date   VALPROATE 58 07/30/2023   Lab Results  Component Value Date  CBMZ 7.4 07/24/2013    Physical Findings   AIMS    Flowsheet Row [facility]) [facility] Ad[facility] (Discharged) [facility] Admission (Discharged) from 07/16/2017 in [facility]PAT[facility] BE[facility]ICINE Admission (Discharged) [facility] Admission (Discharged) [facility]  AIMS Total Score 0 9 0 0 0      AUDIT    Flowsheet Row Ad[facility]) [facility] Admission (Discharged) [facility] Admission (Discharged) from 08/13/2015 in [facility] INPATIENT BEHAVIORAL MEDICINE Admission (Discharged) [facility] Admission (Discharged) [facility]  Alcohol Use Disorder Identification Test Final Score (AUDIT) 16 16 0 0 0      PHQ2-9    Flowsheet Row ED from 10/02/2023 in [facility]  PHQ-2 Total Score 0      Flowsheet Row ED from 10/02/2023 in [facility] Most recent reading at 10/02/2023  3:28 PM ED from 10/02/2023 in [facility] Emergency Department at [facility] Most recent reading at 10/02/2023  4:46 AM ED from 09/12/2023 in [facility] Emergency Department at [facility] Most recent reading at 09/12/2023 10:41 PM  C-SSRS RISK CATEGORY Low Risk Moderate Risk Error: Q3, 4, or 5 should not be populated when Q2 is No        Musculoskeletal  Strength & Muscle  Tone: {desc; muscle tone:32375} Gait & Station: {PE GAIT ED ZOXW:96045} Patient leans: {Patient Leans:[id number]}  Psychiatric Specialty Exam  Presentation  General Appearance:  Appropriate for Environment; Casual; Other (comment) (tattoos on face)  Eye Contact: Good  Speech: Clear and Coherent; Normal Rate  Speech Volume: Normal  Handedness: Right   Mood and Affect  Mood: Euthymic  Affect: Appropriate; Congruent   Thought Process  Thought Processes: Coherent; Linear  Descriptions of Associations:Intact  Orientation:Full (Time, Place and Person)  Thought Content:Logical  Diagnosis of Schizophrenia or Schizoaffective disorder in past: No    Hallucinations:Hallucinations: None  Ideas of Reference:None  Suicidal Thoughts:Suicidal Thoughts: Yes, Active SI Active Intent and/or Plan: With Intent; With Plan; Without Means to Carry Out; Without Access to Means  Homicidal Thoughts:No data recorded  Sensorium  Memory: Immediate Good; Recent Good; Remote Good  Judgment: Intact  Insight: Good   Executive Functions  Concentration: Fair  Attention Span: Good  Recall: Good  Fund of Knowledge: Good  Language: Good   Psychomotor Activity  Psychomotor Activity: Psychomotor Activity: Normal   Assets  Assets: Resilience; Communication Skills; Physical Health; Social Support   Sleep  Sleep: Sleep: Fair   Nutritional Assessment (For OBS and FBC admissions only) Has the patient had a weight loss or gain of 10 pounds or more in the last 3 months?: No Has the patient had a decrease in food intake/or appetite?: No Does the patient have dental problems?: No Does the patient have eating habits or behaviors that may be indicators of an eating disorder including binging or inducing vomiting?: No Has the patient recently lost weight without trying?: 0 Has the patient been eating poorly because of a decreased appetite?: 0 Malnutrition Screening Tool  Score: 0    Physical Exam  Physical Exam ROS Blood pressure 99/65, pulse 73, temperature 97.7 F (36.5 C), temperature source Oral, resp. rate  18, height 5\' 9"  (1.753 m), weight 182 lb (82.6 kg), SpO2 100%. Body mass index is 26.88 kg/m.  Treatment Plan Summary: {CHL [facility] MD [place]. UJWJ:[id number]}  Maryagnes Amos, FNP 10/04/2023 9:41 AM

## 2023-10-05 ENCOUNTER — Encounter (HOSPITAL_COMMUNITY): Payer: Self-pay

## 2023-10-05 ENCOUNTER — Other Ambulatory Visit: Payer: Self-pay

## 2023-10-05 ENCOUNTER — Emergency Department (HOSPITAL_COMMUNITY)
Admission: EM | Admit: 2023-10-05 | Discharge: 2023-10-05 | Disposition: A | Discharge status: Home / Self Care | Payer: Medicaid Other | Attending: Emergency Medicine | Admitting: Emergency Medicine

## 2023-10-05 DIAGNOSIS — J45909 Unspecified asthma, uncomplicated: Secondary | ICD-10-CM | POA: Insufficient documentation

## 2023-10-05 DIAGNOSIS — F1994 Other psychoactive substance use, unspecified with psychoactive substance-induced mood disorder: Secondary | ICD-10-CM | POA: Diagnosis not present

## 2023-10-05 DIAGNOSIS — F1420 Cocaine dependence, uncomplicated: Secondary | ICD-10-CM | POA: Diagnosis not present

## 2023-10-05 DIAGNOSIS — M79672 Pain in left foot: Secondary | ICD-10-CM | POA: Diagnosis present

## 2023-10-05 DIAGNOSIS — R45851 Suicidal ideations: Secondary | ICD-10-CM | POA: Diagnosis not present

## 2023-10-05 DIAGNOSIS — F1220 Cannabis dependence, uncomplicated: Secondary | ICD-10-CM | POA: Diagnosis not present

## 2023-10-05 MED ORDER — BACITRACIN ZINC 500 UNIT/GM EX OINT
TOPICAL_OINTMENT | Freq: Two times a day (BID) | CUTANEOUS | Status: DC
Start: 2023-10-05 — End: 2023-10-05
  Filled 2023-10-05: qty 0.9

## 2023-10-05 NOTE — ED Triage Notes (Signed)
 Pt to ED via [facility] from the streets c/o feet/leg pain after attempting to walk from [place] to [place].   Last VS 142/88, 99%ra, 88HR. 87CBG

## 2023-10-05 NOTE — ED Notes (Signed)
 Pt reports he is homeless and is requesting to go to [facility] , pt denies SI, but reports "I feel safe there"

## 2023-10-05 NOTE — ED Notes (Signed)
 Pt d/c home per EDP order. Discharge summary reviewed, verbalize understanding. NAD, Bus pass provided per request.
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
## 2023-10-06 ENCOUNTER — Encounter (HOSPITAL_COMMUNITY): Payer: Self-pay | Admitting: Emergency Medicine

## 2023-10-06 ENCOUNTER — Emergency Department (HOSPITAL_COMMUNITY)
Admission: EM | Admit: 2023-10-06 | Discharge: 2023-10-06 | Disposition: A | Discharge status: Other Acute Inpatient Hospital | Payer: Medicaid Other | Attending: Emergency Medicine | Admitting: Emergency Medicine

## 2023-10-06 ENCOUNTER — Other Ambulatory Visit (HOSPITAL_COMMUNITY)
Admission: EM | Admit: 2023-10-06 | Discharge: 2023-10-06 | Discharge status: Against Medical Advice | Payer: Medicaid Other | Source: Home / Self Care | Admitting: Psychiatry

## 2023-10-06 DIAGNOSIS — F259 Schizoaffective disorder, unspecified: Secondary | ICD-10-CM | POA: Diagnosis not present

## 2023-10-06 DIAGNOSIS — F1220 Cannabis dependence, uncomplicated: Secondary | ICD-10-CM | POA: Diagnosis present

## 2023-10-06 DIAGNOSIS — F39 Unspecified mood [affective] disorder: Secondary | ICD-10-CM

## 2023-10-06 DIAGNOSIS — F1910 Other psychoactive substance abuse, uncomplicated: Secondary | ICD-10-CM | POA: Diagnosis not present

## 2023-10-06 DIAGNOSIS — F1994 Other psychoactive substance use, unspecified with psychoactive substance-induced mood disorder: Secondary | ICD-10-CM | POA: Diagnosis present

## 2023-10-06 DIAGNOSIS — Z5900 Homelessness unspecified: Secondary | ICD-10-CM

## 2023-10-06 DIAGNOSIS — F1420 Cocaine dependence, uncomplicated: Secondary | ICD-10-CM | POA: Diagnosis not present

## 2023-10-06 DIAGNOSIS — R45851 Suicidal ideations: Secondary | ICD-10-CM

## 2023-10-06 DIAGNOSIS — F1914 Other psychoactive substance abuse with psychoactive substance-induced mood disorder: Secondary | ICD-10-CM | POA: Insufficient documentation

## 2023-10-06 DIAGNOSIS — F1110 Opioid abuse, uncomplicated: Secondary | ICD-10-CM | POA: Diagnosis not present

## 2023-10-06 DIAGNOSIS — F1410 Cocaine abuse, uncomplicated: Secondary | ICD-10-CM | POA: Insufficient documentation

## 2023-10-06 DIAGNOSIS — F411 Generalized anxiety disorder: Secondary | ICD-10-CM | POA: Diagnosis not present

## 2023-10-06 DIAGNOSIS — F1120 Opioid dependence, uncomplicated: Secondary | ICD-10-CM | POA: Diagnosis present

## 2023-10-06 DIAGNOSIS — Z765 Malingerer [conscious simulation]: Secondary | ICD-10-CM | POA: Diagnosis not present

## 2023-10-06 DIAGNOSIS — F1210 Cannabis abuse, uncomplicated: Secondary | ICD-10-CM | POA: Diagnosis not present

## 2023-10-06 DIAGNOSIS — F4310 Post-traumatic stress disorder, unspecified: Secondary | ICD-10-CM | POA: Diagnosis not present

## 2023-10-06 DIAGNOSIS — F3189 Other bipolar disorder: Secondary | ICD-10-CM | POA: Insufficient documentation

## 2023-10-06 DIAGNOSIS — Z5329 Procedure and treatment not carried out because of patient's decision for other reasons: Secondary | ICD-10-CM | POA: Diagnosis not present

## 2023-10-06 HISTORY — DX: Cocaine dependence, uncomplicated: F14.20

## 2023-10-06 HISTORY — DX: Other symptoms and signs involving emotional state: R45.89

## 2023-10-06 HISTORY — DX: Cannabis dependence, uncomplicated: F12.20

## 2023-10-06 LAB — RAPID URINE DRUG SCREEN, HOSP PERFORMED
Amphetamines: POSITIVE — AB
Barbiturates: NOT DETECTED
Benzodiazepines: POSITIVE — AB
Cocaine: POSITIVE — AB
Opiates: NOT DETECTED
Tetrahydrocannabinol: POSITIVE — AB

## 2023-10-06 LAB — CBC WITH DIFFERENTIAL/PLATELET
Abs Immature Granulocytes: 0.01 10*3/uL (ref 0.00–0.07)
Basophils Absolute: 0 10*3/uL (ref 0.0–0.1)
Basophils Relative: 1 %
Eosinophils Absolute: 0.1 10*3/uL (ref 0.0–0.5)
Eosinophils Relative: 2 %
HCT: 47.7 % (ref 39.0–52.0)
Hemoglobin: 15.3 g/dL (ref 13.0–17.0)
Immature Granulocytes: 0 %
Lymphocytes Relative: 33 %
Lymphs Abs: 1.8 10*3/uL (ref 0.7–4.0)
MCH: 25.2 pg — ABNORMAL LOW (ref 26.0–34.0)
MCHC: 32.1 g/dL (ref 30.0–36.0)
MCV: 78.6 fL — ABNORMAL LOW (ref 80.0–100.0)
Monocytes Absolute: 0.7 10*3/uL (ref 0.1–1.0)
Monocytes Relative: 13 %
Neutro Abs: 2.8 10*3/uL (ref 1.7–7.7)
Neutrophils Relative %: 51 %
Platelets: 202 10*3/uL (ref 150–400)
RBC: 6.07 MIL/uL — ABNORMAL HIGH (ref 4.22–5.81)
RDW: 13.5 % (ref 11.5–15.5)
WBC: 5.4 10*3/uL (ref 4.0–10.5)
nRBC: 0 % (ref 0.0–0.2)

## 2023-10-06 LAB — COMPREHENSIVE METABOLIC PANEL
ALT: 21 U/L (ref 0–44)
AST: 20 U/L (ref 15–41)
Albumin: 4.2 g/dL (ref 3.5–5.0)
Alkaline Phosphatase: 50 U/L (ref 38–126)
Anion gap: 11 (ref 5–15)
BUN: 26 mg/dL — ABNORMAL HIGH (ref 6–20)
CO2: 27 mmol/L (ref 22–32)
Calcium: 9.4 mg/dL (ref 8.9–10.3)
Chloride: 100 mmol/L (ref 98–111)
Creatinine, Ser: 1.14 mg/dL (ref 0.61–1.24)
GFR, Estimated: >60 mL/min (ref >60)
Glucose, Bld: 104 mg/dL — ABNORMAL HIGH (ref 70–99)
Potassium: 4.4 mmol/L (ref 3.5–5.1)
Sodium: 138 mmol/L (ref 135–145)
Total Bilirubin: 0.9 mg/dL (ref 0.0–1.2)
Total Protein: 8.2 g/dL — ABNORMAL HIGH (ref 6.5–8.1)

## 2023-10-06 LAB — ETHANOL: Alcohol, Ethyl (B): <10 mg/dL (ref <10)

## 2023-10-06 LAB — VALPROIC ACID LEVEL: Valproic Acid Lvl: 73 ug/mL (ref 50.0–100.0)

## 2023-10-06 MED ORDER — ALPRAZOLAM 0.5 MG PO TABS
0.5000 mg | ORAL_TABLET | Freq: Three times a day (TID) | ORAL | Status: DC
  Filled 2023-10-06: qty 1

## 2023-10-06 MED ORDER — TRAZODONE HCL 100 MG PO TABS
100.0000 mg | ORAL_TABLET | Freq: Every day | ORAL | Status: DC
  Filled 2023-10-06: qty 1

## 2023-10-06 MED ORDER — HYDROXYZINE HCL 25 MG PO TABS: 25.0000 mg | ORAL_TABLET | Freq: Four times a day (QID) | ORAL | Status: DC | PRN

## 2023-10-06 MED ORDER — MAGNESIUM HYDROXIDE 400 MG/5ML PO SUSP: 30.0000 mL | Freq: Every day | ORAL | Status: DC | PRN

## 2023-10-06 MED ORDER — LOPERAMIDE HCL 2 MG PO CAPS: 2.0000 mg | ORAL_CAPSULE | ORAL | Status: DC | PRN

## 2023-10-06 MED ORDER — ONDANSETRON HCL 4 MG PO TABS: 4.0000 mg | ORAL_TABLET | Freq: Three times a day (TID) | ORAL | Status: DC | PRN

## 2023-10-06 MED ORDER — LORAZEPAM 1 MG PO TABS: 2.0000 mg | ORAL_TABLET | Freq: Four times a day (QID) | ORAL | Status: DC | PRN

## 2023-10-06 MED ORDER — GABAPENTIN 100 MG PO CAPS
200.0000 mg | ORAL_CAPSULE | Freq: Three times a day (TID) | ORAL | Status: DC
  Filled 2023-10-06: qty 2

## 2023-10-06 MED ORDER — DIVALPROEX SODIUM 500 MG PO DR TAB
500.0000 mg | DELAYED_RELEASE_TABLET | Freq: Two times a day (BID) | ORAL | Status: DC
  Administered 2023-10-06: 500 mg via ORAL
  Filled 2023-10-06: qty 2

## 2023-10-06 MED ORDER — RISPERIDONE 1 MG PO TABS
1.0000 mg | ORAL_TABLET | Freq: Two times a day (BID) | ORAL | Status: DC
Start: 2023-10-06 — End: 2023-10-06
  Filled 2023-10-06: qty 1

## 2023-10-06 MED ORDER — THIAMINE MONONITRATE 100 MG PO TABS: 100.0000 mg | ORAL_TABLET | Freq: Every day | ORAL | Status: DC

## 2023-10-06 MED ORDER — NICOTINE 21 MG/24HR TD PT24: 21.0000 mg | MEDICATED_PATCH | Freq: Once | TRANSDERMAL | Status: DC | PRN

## 2023-10-06 MED ORDER — LORAZEPAM 1 MG PO TABS: 1.0000 mg | ORAL_TABLET | Freq: Four times a day (QID) | ORAL | Status: DC | PRN

## 2023-10-06 MED ORDER — LORAZEPAM 2 MG/ML IJ SOLN: 2.0000 mg | Freq: Four times a day (QID) | INTRAMUSCULAR | Status: DC | PRN

## 2023-10-06 MED ORDER — RISPERIDONE 1 MG PO TBDP: 2.0000 mg | ORAL_TABLET | Freq: Three times a day (TID) | ORAL | Status: DC | PRN

## 2023-10-06 MED ORDER — DIVALPROEX SODIUM 500 MG PO DR TAB
500.0000 mg | DELAYED_RELEASE_TABLET | Freq: Two times a day (BID) | ORAL | Status: DC
  Filled 2023-10-06: qty 1

## 2023-10-06 MED ORDER — ONDANSETRON 4 MG PO TBDP: 4.0000 mg | ORAL_TABLET | Freq: Four times a day (QID) | ORAL | Status: DC | PRN

## 2023-10-06 MED ORDER — ZIPRASIDONE MESYLATE 20 MG IM SOLR: 20.0000 mg | INTRAMUSCULAR | Status: DC | PRN

## 2023-10-06 MED ORDER — DIPHENHYDRAMINE HCL 50 MG/ML IJ SOLN: 50.0000 mg | Freq: Four times a day (QID) | INTRAMUSCULAR | Status: DC | PRN

## 2023-10-06 MED ORDER — GABAPENTIN 400 MG PO CAPS
800.0000 mg | ORAL_CAPSULE | Freq: Three times a day (TID) | ORAL | Status: DC
  Administered 2023-10-06 (×2): 800 mg via ORAL
  Filled 2023-10-06 (×2): qty 2

## 2023-10-06 MED ORDER — CARIPRAZINE HCL 1.5 MG PO CAPS
1.5000 mg | ORAL_CAPSULE | Freq: Every day | ORAL | Status: DC
  Administered 2023-10-06: 1.5 mg via ORAL
  Filled 2023-10-06: qty 1

## 2023-10-06 MED ORDER — ADULT MULTIVITAMIN W/MINERALS CH: 1.0000 | ORAL_TABLET | Freq: Every day | ORAL | Status: DC

## 2023-10-06 MED ORDER — NICOTINE 21 MG/24HR TD PT24
21.0000 mg | MEDICATED_PATCH | Freq: Once | TRANSDERMAL | Status: DC | PRN
Start: 2023-10-06 — End: 2023-10-06

## 2023-10-06 MED ORDER — ALPRAZOLAM 0.25 MG PO TABS: 0.5000 mg | ORAL_TABLET | Freq: Three times a day (TID) | ORAL | Status: DC

## 2023-10-06 MED ORDER — BUPRENORPHINE HCL-NALOXONE HCL 8-2 MG SL SUBL: 2.0000 | SUBLINGUAL_TABLET | Freq: Every day | SUBLINGUAL | Status: DC

## 2023-10-06 MED ORDER — TRAZODONE HCL 50 MG PO TABS: 100.0000 mg | ORAL_TABLET | Freq: Every day | ORAL | Status: DC

## 2023-10-06 MED ORDER — THIAMINE HCL 100 MG/ML IJ SOLN: 100.0000 mg | Freq: Once | INTRAMUSCULAR | Status: DC

## 2023-10-06 MED ORDER — BUPRENORPHINE HCL-NALOXONE HCL 8-2 MG SL SUBL
2.0000 | SUBLINGUAL_TABLET | Freq: Every day | SUBLINGUAL | Status: DC
  Administered 2023-10-06: 2 via SUBLINGUAL
  Filled 2023-10-06: qty 2

## 2023-10-06 MED ORDER — ALUM & MAG HYDROXIDE-SIMETH 200-200-20 MG/5ML PO SUSP: 30.0000 mL | ORAL | Status: DC | PRN

## 2023-10-06 MED ORDER — LORAZEPAM 1 MG PO TABS: 1.0000 mg | ORAL_TABLET | ORAL | Status: DC | PRN

## 2023-10-06 MED ORDER — GABAPENTIN 100 MG PO CAPS: 200.0000 mg | ORAL_CAPSULE | Freq: Three times a day (TID) | ORAL | Status: DC

## 2023-10-06 MED ORDER — DIPHENHYDRAMINE HCL 50 MG PO CAPS: 50.0000 mg | ORAL_CAPSULE | Freq: Four times a day (QID) | ORAL | Status: DC | PRN

## 2023-10-06 MED ORDER — ALPRAZOLAM 0.25 MG PO TABS
1.0000 mg | ORAL_TABLET | Freq: Three times a day (TID) | ORAL | Status: DC
  Administered 2023-10-06 (×2): 1 mg via ORAL
  Filled 2023-10-06 (×2): qty 4

## 2023-10-06 MED ORDER — RISPERIDONE 1 MG PO TABS
1.0000 mg | ORAL_TABLET | Freq: Two times a day (BID) | ORAL | Status: DC
  Filled 2023-10-06: qty 1

## 2023-10-06 MED ORDER — HALOPERIDOL LACTATE 5 MG/ML IJ SOLN: 5.0000 mg | Freq: Four times a day (QID) | INTRAMUSCULAR | Status: DC | PRN

## 2023-10-06 MED ORDER — HALOPERIDOL 5 MG PO TABS: 5.0000 mg | ORAL_TABLET | Freq: Four times a day (QID) | ORAL | Status: DC | PRN

## 2023-10-06 NOTE — ED Notes (Signed)
 AC & Roy at bedside to speak with patient.  Pt verbally abusive and disturbing the milieu.  Pt verbally abusive to NP Gaither Pouch, refusing to answer questions and demanding the NP get out of the room.  Pt cleared for AMA, refused to sign the AMA form, escorted out of facility by Security.  Belongings returned to patient.  Left A&O x 4, no distress, gait steady.

## 2023-10-06 NOTE — ED Provider Notes (Signed)
  Physical Exam  BP 118/79 (BP Location: Right Arm)   Pulse 91   Temp 98 F (36.7 C) (Oral)   Resp 16   SpO2 100%   Physical Exam  Procedures  Procedures  ED Course / MDM   Clinical Course as of 10/06/23 1758  Thu Oct 06, 2023  1524 Patient signed out to Dr. Lela pending medical clearance. [VK]    Clinical Course User Index [VK] Kingsley, Victoria K, DO   Medical Decision Making Amount and/or Complexity of Data Reviewed Labs: ordered.  Risk OTC drugs. Prescription drug management.   Patient is medically cleared for psychiatric evaluation.  Per psychiatry team, patient can be transferred to facility based care. EMTALA completed.  Patient reassessed.       Cole Sora, MD 10/06/23 1758

## 2023-10-06 NOTE — ED Notes (Signed)
Belonings placed in locker number 2

## 2023-10-06 NOTE — ED Notes (Signed)
 Security came to bedside and wanded patient.

## 2023-10-06 NOTE — ED Provider Notes (Signed)
 EKG Interpretation Date/Time:  Thursday October 06 2023 16:41:25 EST Ventricular Rate:  60 PR Interval:  174 QRS Duration:  96 QT Interval:  412 QTC Calculation: 412 R Axis:   74  Text Interpretation: Normal sinus rhythm with sinus arrhythmia Anterior infarct , age undetermined T wave abnormality, consider inferior ischemia Abnormal ECG When compared with ECG of 02-Oct-2023 10:06, PREVIOUS ECG IS PRESENT No significant change since last tracing Confirmed by Charlyn Sora [phone]) on 10/06/2023 7:19:47 PM          Charlyn Sora, MD 10/06/23 1920

## 2023-10-06 NOTE — ED Notes (Signed)
 Writer told pt, his medication is ready so when he ready, he can come get them, pt came out to eat, and walked back to his room and slam the door.

## 2023-10-06 NOTE — Progress Notes (Addendum)
 Pt was accepted to [facility]) TODAY2/01/2024; Bed Assignment Facility Based Crisis [facility]) Address: [address], [place], [place] [address]  Pt meets Facility Based Crisis [facility]) criteria per Jerel Mannie PIETY  Attending Physician will be Dr. Silva Delsa COME  Report can be called to: - [phone]   Pt can arrive after: 2000  Care Team notified:Terry Mannie PIETY Prentice Perri OBIE Rudell Bing OBIE Dozier Delores OBIE, Dr. Silva Delsa COME, Ankit Nanavati,MD, [facility], LPN    [place], LCSWA 10/06/2023 @ 7:55 PM

## 2023-10-06 NOTE — ED Triage Notes (Addendum)
 Pt. Stated, I need to go to the psyche unit for a couple of days cause I feel hopelessness and I need to try to have something better for myself. Pt. Here yesterday for foot pain
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
## 2023-10-06 NOTE — ED Provider Notes (Signed)
 [place] EMERGENCY DEPARTMENT AT [facility] Provider Note   CSN: [id number] Arrival date & time: 10/06/23  9173     History  Chief Complaint  Patient presents with   Psychiatric Evaluation    Cole [name] is a 43 y.o. male.  Is a 43 year old male with a past medical history of schizophrenia affective, substance use, homelessness presenting to the emergency department with suicidal ideation.  Patient states that he is not feeling suicidal with a plan to cut himself.  He states he has not done anything yet to hurt himself.  Denies any HI.  Denies any command hallucinations.  He states that he is having pain in his left knee and bilateral feet that he was seen for in the ED yesterday.  He has no other acute complaints.  He states he has not had any of his home meds since 2 days ago.  The history is provided by the patient.       Home Medications Prior to Admission medications   Medication Sig Start Date End Date Taking? Authorizing Provider  ALPRAZolam  (XANAX ) 1 MG tablet Take 1 mg by mouth 3 (three) times daily.   Yes [provider]  baclofen  (LIORESAL ) 10 MG tablet Take 10 mg by mouth 2 (two) times daily.   Yes [provider]  buprenorphine -naloxone  (SUBOXONE ) 8-2 mg SUBL SL tablet Place 2 tablets under the tongue daily.   Yes [provider]  cariprazine  (VRAYLAR ) 1.5 MG capsule Take 1.5 mg by mouth in the morning and at bedtime.   Yes [provider]  divalproex  (DEPAKOTE ) 500 MG DR tablet Take 1 tablet (500 mg total) by mouth every 12 (twelve) hours. 10/04/23  Yes Zouev, Dmitri, MD  gabapentin  (NEURONTIN ) 800 MG tablet Take 800 mg by mouth 3 (three) times daily.   Yes [provider]  nicotine  (NICODERM CQ  - DOSED IN MG/24 HOURS) 21 mg/24hr patch Place 1 patch (21 mg total) onto the skin once as needed (nicotine  withdrawal). 10/04/23  Yes Zouev, Dmitri, MD  nicotine  polacrilex (NICORETTE ) 2 MG gum Take 2 mg by mouth every  4 (four) hours while awake.   Yes [provider]  traZODone  (DESYREL ) 100 MG tablet Take 100 mg by mouth at bedtime.   Yes [provider]  hydrOXYzine  (ATARAX ) 25 MG tablet Take 1 tablet (25 mg total) by mouth every 6 (six) hours as needed (anxiety/agitation or CIWA < or = 10). Patient not taking: Reported on 10/06/2023 10/04/23   Zouev, Dmitri, MD  risperiDONE  (RISPERDAL ) 1 MG tablet Take 1 tablet (1 mg total) by mouth 2 (two) times daily. Patient not taking: Reported on 10/06/2023 10/04/23 11/03/23  Zouev, Dmitri, MD      Allergies    Aleve  [naproxen  sodium], Aspirin, Ibuprofen , Seroquel [quetiapine fumerate], Tramadol , and Tylenol  [acetaminophen ]    Review of Systems   Review of Systems  Physical Exam Updated Vital Signs BP 118/79 (BP Location: Right Arm)   Pulse 91   Temp 98 F (36.7 C) (Oral)   Resp 16   SpO2 100%  Physical Exam Vitals and nursing note reviewed.  Constitutional:      General: He is not in acute distress.    Appearance: Normal appearance.  HENT:     Head: Normocephalic and atraumatic.     Nose: Nose normal.     Mouth/Throat:     Mouth: Mucous membranes are moist.  Eyes:     Extraocular Movements: Extraocular movements intact.  Conjunctiva/sclera: Conjunctivae normal.  Cardiovascular:     Rate and Rhythm: Normal rate and regular rhythm.     Pulses: Normal pulses.     Heart sounds: Normal heart sounds.  Pulmonary:     Effort: Pulmonary effort is normal.     Breath sounds: Normal breath sounds.  Abdominal:     General: Abdomen is flat.     Palpations: Abdomen is soft.     Tenderness: There is no abdominal tenderness.  Musculoskeletal:        General: Normal range of motion.     Cervical back: Normal range of motion.     Comments: Mild tenderness to left medial knee, no knee joint laxity, no palpable knee joint effusion, ROM intact  Skin:    General: Skin is warm and dry.  Neurological:     General: No focal deficit present.      Mental Status: He is alert and oriented to person, place, and time.  Psychiatric:        Mood and Affect: Mood normal.        Behavior: Behavior normal.     ED Results / Procedures / Treatments   Labs (all labs ordered are listed, but only abnormal results are displayed) Labs Reviewed  COMPREHENSIVE METABOLIC PANEL  ETHANOL  RAPID URINE DRUG SCREEN, HOSP PERFORMED  CBC WITH DIFFERENTIAL/PLATELET    EKG None  Radiology No results found.  Procedures Procedures    Medications Ordered in ED Medications  divalproex  (DEPAKOTE ) DR tablet 500 mg (500 mg Oral Given 10/06/23 1144)  nicotine  (NICODERM CQ  - dosed in mg/24 hours) patch 21 mg (has no administration in time range)  ALPRAZolam  (XANAX ) tablet 1 mg (1 mg Oral Given 10/06/23 1144)  buprenorphine -naloxone  (SUBOXONE ) 8-2 mg per SL tablet 2 tablet (2 tablets Sublingual Given 10/06/23 1144)  cariprazine  (VRAYLAR ) capsule 1.5 mg (1.5 mg Oral Given 10/06/23 1144)  traZODone  (DESYREL ) tablet 100 mg (has no administration in time range)  gabapentin  (NEURONTIN ) capsule 800 mg (800 mg Oral Given 10/06/23 1144)    ED Course/ Medical Decision Making/ A&P Clinical Course as of 10/06/23 1525  Thu Oct 06, 2023  1524 Patient signed out to Dr. Lela pending medical clearance. [VK]    Clinical Course User Index [VK] Kingsley, Guyla Bless K, DO                                 Medical Decision Making This patient presents to the ED with chief complaint(s) of suicidal ideation with pertinent past medical history of schizoaffective, substance use, homelessness which further complicates the presenting complaint. The complaint involves an extensive differential diagnosis and also carries with it a high risk of complications and morbidity.    The differential diagnosis includes suicidal ideation, malingering, depression  Additional history obtained: Additional history obtained from N/A Records reviewed recent [facility] records  ED Course and  Reassessment: On patient's arrival he is hemodynamically stable in no acute distress.  Patient does report suicidal ideation with a plan for myself.  He is agreeable for psychiatry evaluation.  He will be placed on suicide precautions.  He will have labs performed for medical screening.  Home meds were ordered and he will be closely reassessed.  Independent labs interpretation:  pending  Independent visualization of imaging: - N/A     Amount and/or Complexity of Data Reviewed Labs: ordered.  Risk OTC drugs. Prescription drug management.  Final Clinical Impression(s) / ED Diagnoses Final diagnoses:  None    Rx / DC Orders ED Discharge Orders     None         Kingsley, Graclynn Vanantwerp K, DO 10/06/23 1525
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
## 2023-10-06 NOTE — ED Notes (Addendum)
 Pt came onto the unit yelling saying I don't want to be here. On skin assessment he was throwing cursing at staff saying he doesn't need to be doing this. He is wearing what he came here 3 days ago in and they let him have it. We told him he can't have strings on the unit. He threw the pants and socks at staff and cursed some more then demanded food and meds. Nurse told him he has meds at the window and after he ate ([facility] went and got him food) He can come up and get his meds. He went straight to his room. Slamming the doors. Security is advised to keep [facility] pulled up on cameras.

## 2023-10-06 NOTE — ED Provider Notes (Signed)
 Facility Based Crisis Admission H&P  Date: 10/06/23 Patient Name: Cole [name] MRN: [id number] Chief Complaint: substance abuse  Diagnoses:  Final diagnoses:  Substance abuse (HCC)  Homelessness unspecified  Episodic mood disorder (HCC)    HPI: Cole [name], with a history of polysubstance abuse which includes opioids cocaine and marijuana, malingering, schizoaffective disorder, MDD, GAD, bipolar disorder and PTSD patient presented to [facility] as a direct admission to [facility].  Please see notes: Cole [name] is a 43 y.o. AA male with a past psychiatric history of polysubstance abuse (I.e., opiates, cocaine, cannabis), malingering, schizoaffective disorder, MDD, GAD, substance-induced mood disorder, bipolar affective disorder, and PTSD, with pertinent medical comorbidities/history that include none, who presented this encounter by way of self for endorsements of a need for psychiatric evaluation and bilateral foot pain, who after being evaluated by EDP, began to endorse a recrudescence of suicidal ideations in the context of psychosocial stressors, thus psychiatry was consulted. Patient notably recently voluntarily self discharged from facility based crisis on 10/04/2023.   Upon evaluation, patient presents with symptomology that is most consistent with a substance-induced mood disorder, severe polysubstance abuse in the form currently of cocaine and cannabis, and homelessness. Evidence of this is supported by the patient's endorsements of instability of his mood in the context of psychosocial stressors and severe substance abuse.   Patient endorses during evaluation that he deeply regrets requesting voluntary discharge from facility based crisis before being helped to transfer into inpatient rehabilitation services, because upon voluntary request for discharge, states that he immediately relapsed on illicit substances in the form of cocaine and cannabis, causing instability in his mood, and a  recrudescence of feeling helpless and experiencing depressive and anxious symptomology.    Discussed with patient that the patient could participate in facility based crisis again if he was amenable to seriously addressing his substance abuse, to which patient through extensive conversation, endorsed that he was amenable to this.   Given the patient's endorsements of wanting to address his substance abuse, and his endorsements of instability in his mood, will recommend facility based crisis inpatient at this time, in addition to restarting the patient's outpatient psychiatric medications, that he reports have been helpful for him, with the exception being of will continue the patient's current Xanax  taper of 0.5 mg 3 times daily and implement CIWA protocols for safety.    Per behavioral health team, patient has tentatively been accepted to facility based crisis, will coordinate with team to have patient sent to disposition when able.  Face-to-face observation of patient, patient is alert and oriented, speech is very blocked patient is very aggressive, patient stated he does not want to be here stating that he wants to go to [facility].  Patient started to use indecent language cursing at staff.  Patient refused to answer any other questions.  We will admit to [facility]  PHQ 2-9:  Flowsheet Row ED from 10/06/2023 in [facility] ED from 10/02/2023 in [facility]  Thoughts that you would be better off dead, or of hurting yourself in some way More than half the days More than half the days  PHQ-9 Total Score 16 18       Flowsheet Row ED from 10/06/2023 in [facility] Emergency Department at [facility] ED from 10/05/2023 in [facility] Emergency Department at [facility] ED from 10/02/2023 in [facility]  C-SSRS RISK CATEGORY Error: Question 6 not populated Error: Q3, 4,  or 5 should not be populated when Q2 is No Low  Risk         Total Time spent with patient: 20 minutes  Musculoskeletal  Strength & Muscle Tone: within normal limits Gait & Station: normal Patient leans: N/A  Psychiatric Specialty Exam  Presentation General Appearance:  Disheveled  Eye Contact: Fair  Speech: Blocked  Speech Volume: Increased  Handedness: Right   Mood and Affect  Mood: Euthymic  Affect: Appropriate   Thought Process  Thought Processes: Linear  Descriptions of Associations:Intact  Orientation:Full (Time, Place and Person)  Thought Content:WDL  Diagnosis of Schizophrenia or Schizoaffective disorder in past: No   Hallucinations:Hallucinations: None  Ideas of Reference:None  Suicidal Thoughts:Suicidal Thoughts: No  Homicidal Thoughts:Homicidal Thoughts: No   Sensorium  Memory: Immediate Fair  Judgment: Fair  Insight: Lacking   Executive Functions  Concentration: Fair  Attention Span: Fair  Recall: Fair  Fund of Knowledge: Fair  Language: Fair   Psychomotor Activity  Psychomotor Activity: Psychomotor Activity: Normal   Assets  Assets: Desire for Improvement; Housing   Sleep  Sleep: Sleep: Fair Number of Hours of Sleep: 6   Nutritional Assessment (For OBS and FBC admissions only) Has the patient had a weight loss or gain of 10 pounds or more in the last 3 months?: No Has the patient had a decrease in food intake/or appetite?: No Does the patient have dental problems?: No Does the patient have eating habits or behaviors that may be indicators of an eating disorder including binging or inducing vomiting?: No Has the patient recently lost weight without trying?: 0 Has the patient been eating poorly because of a decreased appetite?: 0 Malnutrition Screening Tool Score: 0    Physical Exam HENT:     Head: Normocephalic.     Nose: Nose normal.  Eyes:     Pupils: Pupils are equal, round, and reactive to light.  Cardiovascular:     Rate and  Rhythm: Normal rate.  Pulmonary:     Effort: Pulmonary effort is normal.  Musculoskeletal:        General: Normal range of motion.     Cervical back: Normal range of motion.  Neurological:     General: No focal deficit present.     Mental Status: He is alert.  Psychiatric:        Mood and Affect: Mood normal.        Behavior: Behavior normal.        Thought Content: Thought content normal.        Judgment: Judgment normal.    Review of Systems  Constitutional: Negative.   HENT: Negative.    Eyes: Negative.   Respiratory: Negative.    Cardiovascular: Negative.   Gastrointestinal: Negative.   Genitourinary: Negative.   Musculoskeletal: Negative.   Skin: Negative.   Neurological: Negative.   Psychiatric/Behavioral:  Positive for substance abuse. The patient is nervous/anxious.     There were no vitals taken for this visit. There is no height or weight on file to calculate BMI.  Past Psychiatric History: Polysubstance abuse, schizoaffective disorder, malingering, homelessness  Is the patient at risk to self? No  Has the patient been a risk to self in the past 6 months? No .    Has the patient been a risk to self within the distant past? No   Is the patient a risk to others? No   Has the patient been a risk to others in the past 6 months? No  Has the patient been a risk to others within the distant past? No   Past Medical History: see chart  Family History: unknown  Social History: Cocaine abuse, marijuana abuse, opioid abuse,  Last Labs:  Admission on 10/06/2023, Discharged on 10/06/2023  Component Date Value Ref Range Status   Sodium 10/06/2023 138  135 - 145 mmol/L Final   Potassium 10/06/2023 4.4  3.5 - 5.1 mmol/L Final   Chloride 10/06/2023 100  98 - 111 mmol/L Final   CO2 10/06/2023 27  22 - 32 mmol/L Final   Glucose, Bld 10/06/2023 104 (H)  70 - 99 mg/dL Final   Glucose reference range applies only to samples taken after fasting for at least 8 hours.   BUN  10/06/2023 26 (H)  6 - 20 mg/dL Final   Creatinine, Ser 10/06/2023 1.14  0.61 - 1.24 mg/dL Final   Calcium 97/93/7974 9.4  8.9 - 10.3 mg/dL Final   Total Protein 97/93/7974 8.2 (H)  6.5 - 8.1 g/dL Final   Albumin 97/93/7974 4.2  3.5 - 5.0 g/dL Final   AST 97/93/7974 20  15 - 41 U/L Final   ALT 10/06/2023 21  0 - 44 U/L Final   Alkaline Phosphatase 10/06/2023 50  38 - 126 U/L Final   Total Bilirubin 10/06/2023 0.9  0.0 - 1.2 mg/dL Final   GFR, Estimated 10/06/2023 >60  >60 mL/min Final   Comment: (NOTE) Calculated using the CKD-EPI Creatinine Equation (2021)    Anion gap 10/06/2023 11  5 - 15 Final   Performed at [facility], [address], [place], [place] [address]   Alcohol, Ethyl (B) 10/06/2023 <10  <10 mg/dL Final   Comment: (NOTE) Lowest detectable limit for serum alcohol is 10 mg/dL.  For medical purposes only. Performed at [facility], [address] [place], [place] [address]    Opiates 10/06/2023 NONE DETECTED  NONE DETECTED Final   Cocaine 10/06/2023 POSITIVE (A)  NONE DETECTED Final   Benzodiazepines 10/06/2023 POSITIVE (A)  NONE DETECTED Final   Amphetamines 10/06/2023 POSITIVE (A)  NONE DETECTED Final   Tetrahydrocannabinol 10/06/2023 POSITIVE (A)  NONE DETECTED Final   Barbiturates 10/06/2023 NONE DETECTED  NONE DETECTED Final   Comment: (NOTE) DRUG SCREEN FOR MEDICAL PURPOSES ONLY.  IF CONFIRMATION IS NEEDED FOR ANY PURPOSE, NOTIFY LAB WITHIN 5 DAYS.  LOWEST DETECTABLE LIMITS FOR URINE DRUG SCREEN Drug Class                     Cutoff (ng/mL) Amphetamine and metabolites    1000 Barbiturate and metabolites    200 Benzodiazepine                 200 Opiates and metabolites        300 Cocaine and metabolites        300 THC                            50 Performed at [facility], [address] [place], [place] [address]    WBC 10/06/2023 5.4  4.0 - 10.5 K/uL Final   RBC 10/06/2023 6.07 (H)  4.22 - 5.81 MIL/uL Final   Hemoglobin  10/06/2023 15.3  13.0 - 17.0 g/dL Final   HCT 97/93/7974 47.7  39.0 - 52.0 % Final   MCV 10/06/2023 78.6 (L)  80.0 - 100.0 fL Final   MCH 10/06/2023 25.2 (L)  26.0 -  34.0 pg Final   MCHC 10/06/2023 32.1  30.0 - 36.0 g/dL Final   RDW 97/93/7974 13.5  11.5 - 15.5 % Final   Platelets 10/06/2023 202  150 - 400 K/uL Final   nRBC 10/06/2023 0.0  0.0 - 0.2 % Final   Neutrophils Relative % 10/06/2023 51  % Final   Neutro Abs 10/06/2023 2.8  1.7 - 7.7 K/uL Final   Lymphocytes Relative 10/06/2023 33  % Final   Lymphs Abs 10/06/2023 1.8  0.7 - 4.0 K/uL Final   Monocytes Relative 10/06/2023 13  % Final   Monocytes Absolute 10/06/2023 0.7  0.1 - 1.0 K/uL Final   Eosinophils Relative 10/06/2023 2  % Final   Eosinophils Absolute 10/06/2023 0.1  0.0 - 0.5 K/uL Final   Basophils Relative 10/06/2023 1  % Final   Basophils Absolute 10/06/2023 0.0  0.0 - 0.1 K/uL Final   Immature Granulocytes 10/06/2023 0  % Final   Abs Immature Granulocytes 10/06/2023 0.01  0.00 - 0.07 K/uL Final   Performed at [facility], [address], [place], [place] [address]   Valproic Acid  Lvl 10/06/2023 73  50.0 - 100.0 ug/mL Final   Performed at [facility], [address], [place], [place] [address]  Admission on 10/02/2023, Discharged on 10/02/2023  Component Date Value Ref Range Status   Sodium 10/02/2023 138  135 - 145 mmol/L Final   Potassium 10/02/2023 3.7  3.5 - 5.1 mmol/L Final   Chloride 10/02/2023 103  98 - 111 mmol/L Final   CO2 10/02/2023 28  22 - 32 mmol/L Final   Glucose, Bld 10/02/2023 82  70 - 99 mg/dL Final   Glucose reference range applies only to samples taken after fasting for at least 8 hours.   BUN 10/02/2023 24 (H)  6 - 20 mg/dL Final   Creatinine, Ser 10/02/2023 0.82  0.61 - 1.24 mg/dL Final   Calcium 97/97/7974 8.7 (L)  8.9 - 10.3 mg/dL Final   Total Protein 97/97/7974 7.0  6.5 - 8.1 g/dL Final   Albumin 97/97/7974 3.7  3.5 - 5.0 g/dL Final   AST 97/97/7974 34  15 - 41 U/L Final    ALT 10/02/2023 24  0 - 44 U/L Final   Alkaline Phosphatase 10/02/2023 46  38 - 126 U/L Final   Total Bilirubin 10/02/2023 0.8  0.0 - 1.2 mg/dL Final   GFR, Estimated 10/02/2023 >60  >60 mL/min Final   Comment: (NOTE) Calculated using the CKD-EPI Creatinine Equation (2021)    Anion gap 10/02/2023 7  5 - 15 Final   Performed at [facility], [address], [place], [place] [address]   Alcohol, Ethyl (B) 10/02/2023 <10  <10 mg/dL Final   Comment: (NOTE) Lowest detectable limit for serum alcohol is 10 mg/dL.  For medical purposes only. Performed at [facility], [address], [place], [place] [address]    Salicylate Lvl 10/02/2023 <7.0 (L)  7.0 - 30.0 mg/dL Final   Performed at [facility], [address], [place], [place] [address]   Acetaminophen  (Tylenol ), Serum 10/02/2023 <10 (L)  10 - 30 ug/mL Final   Comment: (NOTE) Therapeutic concentrations vary significantly. A range of 10-30 ug/mL  may be an effective concentration for many patients. However, some  are best treated at concentrations outside of this range. Acetaminophen  concentrations >150 ug/mL at 4 hours after ingestion  and >50 ug/mL at 12 hours after ingestion are often associated with  toxic reactions.  Performed at  [facility], [address], [place], [place] [address]    WBC 10/02/2023 4.8  4.0 - 10.5 K/uL Final   RBC 10/02/2023 4.92  4.22 - 5.81 MIL/uL Final   Hemoglobin 10/02/2023 12.8 (L)  13.0 - 17.0 g/dL Final   HCT 97/97/7974 39.2  39.0 - 52.0 % Final   MCV 10/02/2023 79.7 (L)  80.0 - 100.0 fL Final   MCH 10/02/2023 26.0  26.0 - 34.0 pg Final   MCHC 10/02/2023 32.7  30.0 - 36.0 g/dL Final   RDW 97/97/7974 13.5  11.5 - 15.5 % Final   Platelets 10/02/2023 162  150 - 400 K/uL Final   nRBC 10/02/2023 0.0  0.0 - 0.2 % Final   Performed at [facility], [address], [place], [place] [address]   Opiates 10/02/2023 NONE DETECTED  NONE DETECTED Final   Cocaine 10/02/2023 POSITIVE (A)  NONE DETECTED Final    Benzodiazepines 10/02/2023 NONE DETECTED  NONE DETECTED Final   Amphetamines 10/02/2023 NONE DETECTED  NONE DETECTED Final   Tetrahydrocannabinol 10/02/2023 POSITIVE (A)  NONE DETECTED Final   Barbiturates 10/02/2023 NONE DETECTED  NONE DETECTED Final   Comment: (NOTE) DRUG SCREEN FOR MEDICAL PURPOSES ONLY.  IF CONFIRMATION IS NEEDED FOR ANY PURPOSE, NOTIFY LAB WITHIN 5 DAYS.  LOWEST DETECTABLE LIMITS FOR URINE DRUG SCREEN Drug Class                     Cutoff (ng/mL) Amphetamine and metabolites    1000 Barbiturate and metabolites    200 Benzodiazepine                 200 Opiates and metabolites        300 Cocaine and metabolites        300 THC                            50 Performed at [facility], [address] [place], [place] [address]   Admission on 08/07/2023, Discharged on 08/07/2023  Component Date Value Ref Range Status   Sodium 08/07/2023 137  135 - 145 mmol/L Final   Potassium 08/07/2023 3.7  3.5 - 5.1 mmol/L Final   Chloride 08/07/2023 99  98 - 111 mmol/L Final   CO2 08/07/2023 29  22 - 32 mmol/L Final   Glucose, Bld 08/07/2023 69 (L)  70 - 99 mg/dL Final   Glucose reference range applies only to samples taken after fasting for at least 8 hours.   BUN 08/07/2023 17  6 - 20 mg/dL Final   Creatinine, Ser 08/07/2023 0.88  0.61 - 1.24 mg/dL Final   Calcium 87/91/7975 9.4  8.9 - 10.3 mg/dL Final   Total Protein 87/91/7975 8.1  6.5 - 8.1 g/dL Final   Albumin 87/91/7975 4.5  3.5 - 5.0 g/dL Final   AST 87/91/7975 21  15 - 41 U/L Final   ALT 08/07/2023 27  0 - 44 U/L Final   Alkaline Phosphatase 08/07/2023 47  38 - 126 U/L Final   Total Bilirubin 08/07/2023 0.8  <1.2 mg/dL Final   GFR, Estimated 08/07/2023 >60  >60 mL/min Final   Comment: (NOTE) Calculated using the CKD-EPI Creatinine Equation (2021)    Anion gap 08/07/2023 9  5 - 15 Final   Performed at [facility], [address], [place], [place] [address]   Alcohol, Ethyl (B) 08/07/2023 <10  <10 mg/dL Final    Comment: (NOTE) Lowest detectable limit  for serum alcohol is 10 mg/dL.  For medical purposes only. Performed at [facility], [address], [place], [place] [address]    WBC 08/07/2023 9.0  4.0 - 10.5 K/uL Final   RBC 08/07/2023 5.90 (H)  4.22 - 5.81 MIL/uL Final   Hemoglobin 08/07/2023 15.0  13.0 - 17.0 g/dL Final   HCT 87/91/7975 47.6  39.0 - 52.0 % Final   MCV 08/07/2023 80.7  80.0 - 100.0 fL Final   MCH 08/07/2023 25.4 (L)  26.0 - 34.0 pg Final   MCHC 08/07/2023 31.5  30.0 - 36.0 g/dL Final   RDW 87/91/7975 13.4  11.5 - 15.5 % Final   Platelets 08/07/2023 167  150 - 400 K/uL Final   nRBC 08/07/2023 0.0  0.0 - 0.2 % Final   Performed at [facility], [address], [place], [place] [address]  Admission on 07/29/2023, Discharged on 08/02/2023  Component Date Value Ref Range Status   Ferritin 07/30/2023 26  24 - 336 ng/mL Final   Performed at [facility], [address], [place], [place] [address]   Hgb A1c MFr Bld 07/30/2023 5.5  4.8 - 5.6 % Final   Comment: (NOTE) Pre diabetes:          5.7%-6.4%  Diabetes:              >6.4%  Glycemic control for   <7.0% adults with diabetes    Mean Plasma Glucose 07/30/2023 111.15  mg/dL Final   Performed at [facility], [address] [place], [place] [address]   TSH 07/30/2023 3.653  0.350 - 4.500 uIU/mL Final   Comment: Performed by a 3rd Generation assay with a functional sensitivity of <=0.01 uIU/mL. Performed at [facility], [address] [place], [place] [address]    Cholesterol 07/30/2023 147  0 - 200 mg/dL Final   Triglycerides 88/69/7975 83  <150 mg/dL Final   HDL 88/69/7975 56  >40 mg/dL Final   Total CHOL/HDL Ratio 07/30/2023 2.6  RATIO Final   VLDL 07/30/2023 17  0 - 40 mg/dL Final   LDL Cholesterol 07/30/2023 74  0 - 99 mg/dL Final   Comment:        Total Cholesterol/HDL:CHD Risk Coronary Heart Disease Risk Table                     Men   Women  1/2 Average Risk   3.4    3.3  Average Risk       5.0   4.4  2 X Average Risk   9.6   7.1  3 X Average Risk  23.4   11.0        Use the calculated Patient Ratio above and the CHD Risk Table to determine the patient's CHD Risk.        ATP III CLASSIFICATION (LDL):  <100     mg/dL   Optimal  899-870  mg/dL   Near or Above                    Optimal  130-159  mg/dL   Borderline  839-810  mg/dL   High  >809     mg/dL   Very High Performed at [facility], [address] [place], [place] [address]    Hepatitis B Surface Ag 07/30/2023 NON REACTIVE  NON REACTIVE Final   HCV Ab 07/30/2023 Reactive (A)  NON REACTIVE Final   Comment: (  NOTE) The CDC recommends that a Reactive HCV antibody result be followed up  with a HCV Nucleic Acid Amplification test.     Hep A IgM 07/30/2023 NON REACTIVE  NON REACTIVE Final   Hep B C IgM 07/30/2023 NON REACTIVE  NON REACTIVE Final   Performed at [facility], [address] [place], [place] [address]   Valproic Acid  Lvl 07/30/2023 58  50.0 - 100.0 ug/mL Final   Performed at [facility], [address], [place], [place] [address]   Iron 07/30/2023 48  45 - 182 ug/dL Final   TIBC 88/69/7975 421  250 - 450 ug/dL Final   Saturation Ratios 07/30/2023 11 (L)  17.9 - 39.5 % Final   UIBC 07/30/2023 373  ug/dL Final   Performed at [facility], [address] [place], [place] [address]  Admission on 07/28/2023, Discharged on 07/29/2023  Component Date Value Ref Range Status   Sodium 07/28/2023 138  135 - 145 mmol/L Final   Potassium 07/28/2023 3.5  3.5 - 5.1 mmol/L Final   Chloride 07/28/2023 107  98 - 111 mmol/L Final   CO2 07/28/2023 26  22 - 32 mmol/L Final   Glucose, Bld 07/28/2023 91  70 - 99 mg/dL Final   Glucose reference range applies only to samples taken after fasting for at least 8 hours.   BUN 07/28/2023 14  6 - 20 mg/dL Final   Creatinine, Ser 07/28/2023 0.74  0.61 - 1.24 mg/dL Final   Calcium  88/71/7975 8.7 (L)  8.9 - 10.3 mg/dL Final   Total Protein 88/71/7975 7.0  6.5 - 8.1 g/dL Final   Albumin 88/71/7975 3.8  3.5 - 5.0 g/dL Final   AST 88/71/7975 14 (L)  15 - 41 U/L Final   ALT 07/28/2023 16  0 - 44 U/L Final   Alkaline Phosphatase 07/28/2023 38  38 - 126 U/L Final   Total Bilirubin 07/28/2023 1.1  <1.2 mg/dL Final   GFR, Estimated 07/28/2023 >60  >60 mL/min Final   Comment: (NOTE) Calculated using the CKD-EPI Creatinine Equation (2021)    Anion gap 07/28/2023 5  5 - 15 Final   Performed at [facility], [address] [place], [place] [address]   Alcohol, Ethyl (B) 07/28/2023 <10  <10 mg/dL Final   Comment: (NOTE) Lowest detectable limit for serum alcohol is 10 mg/dL.  For medical purposes only. Performed at [facility], [address] [place], [place] [address]    Opiates 07/28/2023 NONE DETECTED  NONE DETECTED Final   Cocaine 07/28/2023 POSITIVE (A)  NONE DETECTED Final   Benzodiazepines 07/28/2023 POSITIVE (A)  NONE DETECTED Final   Amphetamines 07/28/2023 NONE DETECTED  NONE DETECTED Final   Tetrahydrocannabinol 07/28/2023 POSITIVE (A)  NONE DETECTED Final   Barbiturates 07/28/2023 NONE DETECTED  NONE DETECTED Final   Comment: (NOTE) DRUG SCREEN FOR MEDICAL PURPOSES ONLY.  IF CONFIRMATION IS NEEDED FOR ANY PURPOSE, NOTIFY LAB WITHIN 5 DAYS.  LOWEST DETECTABLE LIMITS FOR URINE DRUG SCREEN Drug Class                     Cutoff (ng/mL) Amphetamine and metabolites    1000 Barbiturate and metabolites    200 Benzodiazepine                 200 Opiates and metabolites        300 Cocaine and metabolites        300 THC  50 Performed at [facility], [address] [place], [place] [address]    WBC 07/28/2023 4.9  4.0 - 10.5 K/uL Final   RBC 07/28/2023 5.03  4.22 - 5.81 MIL/uL Final   Hemoglobin 07/28/2023 12.8 (L)  13.0 - 17.0 g/dL Final   HCT 88/71/7975 39.7  39.0 - 52.0 % Final   MCV 07/28/2023 78.9 (L)  80.0 - 100.0 fL Final    MCH 07/28/2023 25.4 (L)  26.0 - 34.0 pg Final   MCHC 07/28/2023 32.2  30.0 - 36.0 g/dL Final   RDW 88/71/7975 13.4  11.5 - 15.5 % Final   Platelets 07/28/2023 148 (L)  150 - 400 K/uL Final   nRBC 07/28/2023 0.0  0.0 - 0.2 % Final   Neutrophils Relative % 07/28/2023 52  % Final   Neutro Abs 07/28/2023 2.5  1.7 - 7.7 K/uL Final   Lymphocytes Relative 07/28/2023 38  % Final   Lymphs Abs 07/28/2023 1.8  0.7 - 4.0 K/uL Final   Monocytes Relative 07/28/2023 8  % Final   Monocytes Absolute 07/28/2023 0.4  0.1 - 1.0 K/uL Final   Eosinophils Relative 07/28/2023 2  % Final   Eosinophils Absolute 07/28/2023 0.1  0.0 - 0.5 K/uL Final   Basophils Relative 07/28/2023 0  % Final   Basophils Absolute 07/28/2023 0.0  0.0 - 0.1 K/uL Final   Immature Granulocytes 07/28/2023 0  % Final   Abs Immature Granulocytes 07/28/2023 0.01  0.00 - 0.07 K/uL Final   Performed at [facility], [address], [place], [place] [address]   Salicylate Lvl 07/28/2023 <7.0 (L)  7.0 - 30.0 mg/dL Final   Performed at [facility], [address] [place], [place] [address]   Acetaminophen  (Tylenol ), Serum 07/28/2023 <10 (L)  10 - 30 ug/mL Final   Comment: (NOTE) Therapeutic concentrations vary significantly. A range of 10-30 ug/mL  may be an effective concentration for many patients. However, some  are best treated at concentrations outside of this range. Acetaminophen  concentrations >150 ug/mL at 4 hours after ingestion  and >50 ug/mL at 12 hours after ingestion are often associated with  toxic reactions.  Performed at [facility], [address] [place], [place] [address]   Admission on 05/17/2023, Discharged on 05/17/2023  Component Date Value Ref Range Status   Sodium 05/17/2023 136  135 - 145 mmol/L Final   Potassium 05/17/2023 3.1 (L)  3.5 - 5.1 mmol/L Final   Chloride 05/17/2023 99  98 - 111 mmol/L Final   CO2 05/17/2023 28  22 - 32 mmol/L Final   Glucose, Bld 05/17/2023 90  70 - 99 mg/dL Final   Glucose  reference range applies only to samples taken after fasting for at least 8 hours.   BUN 05/17/2023 12  6 - 20 mg/dL Final   Creatinine, Ser 05/17/2023 0.83  0.61 - 1.24 mg/dL Final   Calcium 90/82/7975 8.5 (L)  8.9 - 10.3 mg/dL Final   Total Protein 90/82/7975 7.3  6.5 - 8.1 g/dL Final   Albumin 90/82/7975 4.0  3.5 - 5.0 g/dL Final   AST 90/82/7975 18  15 - 41 U/L Final   ALT 05/17/2023 19  0 - 44 U/L Final   Alkaline Phosphatase 05/17/2023 45  38 - 126 U/L Final   Total Bilirubin 05/17/2023 1.1  0.3 - 1.2 mg/dL Final   GFR, Estimated 05/17/2023 >60  >60 mL/min Final   Comment: (NOTE) Calculated using the CKD-EPI Creatinine Equation (2021)    Anion gap  05/17/2023 9  5 - 15 Final   Performed at [facility], [address], [place], [place] [address]   Alcohol, Ethyl (B) 05/17/2023 <10  <10 mg/dL Final   Comment: (NOTE) Lowest detectable limit for serum alcohol is 10 mg/dL.  For medical purposes only. Performed at [facility], [address], [place], [place] [address]    Salicylate Lvl 05/17/2023 <7.0 (L)  7.0 - 30.0 mg/dL Final   Performed at [facility], [address], [place], [place] [address]   Acetaminophen  (Tylenol ), Serum 05/17/2023 <10 (L)  10 - 30 ug/mL Final   Comment: (NOTE) Therapeutic concentrations vary significantly. A range of 10-30 ug/mL  may be an effective concentration for many patients. However, some  are best treated at concentrations outside of this range. Acetaminophen  concentrations >150 ug/mL at 4 hours after ingestion  and >50 ug/mL at 12 hours after ingestion are often associated with  toxic reactions.  Performed at [facility], [address], [place], [place] [address]    WBC 05/17/2023 6.3  4.0 - 10.5 K/uL Final   RBC 05/17/2023 4.95  4.22 - 5.81 MIL/uL Final   Hemoglobin 05/17/2023 12.6 (L)  13.0 - 17.0 g/dL Final   HCT 90/82/7975 39.2  39.0 - 52.0 % Final   MCV 05/17/2023 79.2 (L)  80.0 - 100.0 fL Final   MCH 05/17/2023 25.5 (L)  26.0 - 34.0  pg Final   MCHC 05/17/2023 32.1  30.0 - 36.0 g/dL Final   RDW 90/82/7975 14.1  11.5 - 15.5 % Final   Platelets 05/17/2023 156  150 - 400 K/uL Final   nRBC 05/17/2023 0.0  0.0 - 0.2 % Final   Performed at [facility], [address] [place], [place] [address]   Opiates 05/17/2023 NONE DETECTED  NONE DETECTED Final   Cocaine 05/17/2023 POSITIVE (A)  NONE DETECTED Final   Benzodiazepines 05/17/2023 NONE DETECTED  NONE DETECTED Final   Amphetamines 05/17/2023 NONE DETECTED  NONE DETECTED Final   Tetrahydrocannabinol 05/17/2023 POSITIVE (A)  NONE DETECTED Final   Barbiturates 05/17/2023 NONE DETECTED  NONE DETECTED Final   Comment: (NOTE) DRUG SCREEN FOR MEDICAL PURPOSES ONLY.  IF CONFIRMATION IS NEEDED FOR ANY PURPOSE, NOTIFY LAB WITHIN 5 DAYS.  LOWEST DETECTABLE LIMITS FOR URINE DRUG SCREEN Drug Class                     Cutoff (ng/mL) Amphetamine and metabolites    1000 Barbiturate and metabolites    200 Benzodiazepine                 200 Opiates and metabolites        300 Cocaine and metabolites        300 THC                            50 Performed at [facility], [address], [place], [place] [address]     Allergies: Aleve  [naproxen  sodium], Aspirin, Ibuprofen , Seroquel [quetiapine fumerate], Tramadol , and Tylenol  [acetaminophen ]  Medications:  PTA Medications  Medication Sig   nicotine  (NICODERM CQ  - DOSED IN MG/24 HOURS) 21 mg/24hr patch Place 1 patch (21 mg total) onto the skin once as needed (nicotine  withdrawal).   hydrOXYzine  (ATARAX ) 25 MG tablet Take 1 tablet (25 mg total) by mouth every 6 (six) hours as needed (anxiety/agitation or CIWA < or = 10). (Patient not taking: Reported on 10/06/2023)   divalproex  (DEPAKOTE ) 500  MG DR tablet Take 1 tablet (500 mg total) by mouth every 12 (twelve) hours.   risperiDONE  (RISPERDAL ) 1 MG tablet Take 1 tablet (1 mg total) by mouth 2 (two) times daily. (Patient not taking: Reported on 10/06/2023)    Long Term Goals: Improvement  in symptoms so as ready for discharge  Short Term Goals: Patient will verbalize feelings in meetings with treatment team members., Patient will attend at least of 50% of the groups daily., Pt will complete the PHQ9 on admission, day 3 and discharge., Patient will participate in completing the [place] Suicide Severity Rating Scale, Patient will score a low risk of violence for 24 hours prior to discharge, and Patient will take medications as prescribed daily.  Medical Decision Making  Inpatient [facility]    Recommendations  Based on my evaluation the patient does not appear to have an emergency medical condition.  Gaither Pouch, NP 10/06/23  10:01 PM

## 2023-10-06 NOTE — BH Assessment (Addendum)
 Disposition Note:   Confirmed with [facility] provider Blaise Bumps, NP, that the plan is for the patient to be transferred directly from [facility] to [facility] (direct admit/transfer).

## 2023-10-06 NOTE — ED Provider Notes (Signed)
 Writer was notified by facility [facility] that patient was agitated throwing stuff at staff and using obscene language and cursing at nursing staff.  Writer did go over to [facility] to talk with patient,  pt became very disrespectful, cursing and swearing.  Pt stated he don't want to be here and he told them before that he didn't want to come here.  Pt stated he was told he was going to a different place. Pt does not post a risk to himself at this time,  denies wanting to kill or hurt himself.  Pt stated he dont want to talk to anyone,  he was ready to go because I told them in the first place he never wanted to come here. Pt also stated he dont like the nasty food they serve here.  Pt show no sign of psychosis or mania at this time.  No sign of distress at this time.  Pt is not responding to internal stimuli at this time,  he is unhappy with food, demanding,  displaying inappropriate volatile behavior.   Pt advice to return to the nearest ED should he feel suicidal/homicidal.   Pt left without signing AMA form.  Pt very aggressive and verbally abusive prior to leaving.   Writer did talk with Dr Zouvre MD she state that if patient is not suicidal and doesn't pose a risk to himself he was free to be discharge.

## 2023-10-06 NOTE — ED Notes (Signed)
 Patient D/C to [facility] via Psychologist, educational

## 2023-10-07 ENCOUNTER — Emergency Department (HOSPITAL_COMMUNITY)
Admission: EM | Admit: 2023-10-07 | Discharge: 2023-10-07 | Disposition: A | Discharge status: Home / Self Care | Payer: Medicaid Other | Attending: Emergency Medicine | Admitting: Emergency Medicine

## 2023-10-07 ENCOUNTER — Other Ambulatory Visit: Payer: Self-pay

## 2023-10-07 DIAGNOSIS — F1220 Cannabis dependence, uncomplicated: Secondary | ICD-10-CM | POA: Diagnosis not present

## 2023-10-07 DIAGNOSIS — Z5900 Homelessness unspecified: Secondary | ICD-10-CM | POA: Insufficient documentation

## 2023-10-07 DIAGNOSIS — Z765 Malingerer [conscious simulation]: Secondary | ICD-10-CM | POA: Diagnosis present

## 2023-10-07 DIAGNOSIS — R45851 Suicidal ideations: Secondary | ICD-10-CM | POA: Diagnosis not present

## 2023-10-07 DIAGNOSIS — F1420 Cocaine dependence, uncomplicated: Secondary | ICD-10-CM | POA: Diagnosis not present

## 2023-10-07 DIAGNOSIS — F1994 Other psychoactive substance use, unspecified with psychoactive substance-induced mood disorder: Secondary | ICD-10-CM | POA: Diagnosis not present

## 2023-10-07 NOTE — ED Triage Notes (Signed)
 Patient requesting a ride to [facility] and assistance for his medications , seen here this evening and at [facility] and was discharge . Patient is homeless/clothes wet at arrival . Paper scrubs/socks and slippers given .

## 2023-10-07 NOTE — ED Provider Notes (Signed)
 [place] EMERGENCY DEPARTMENT AT [facility] Provider Note   CSN: [id number] Arrival date & time: 10/07/23  0135     History  Chief Complaint  Patient presents with   Requesting Transportation To [facility]    Medication Assistance    Cole [name] is a 43 y.o. male who is unhoused was previously seen in the emergency department and at behavioral health urgent care this evening initially reporting suicidality, became verbally and physically aggressive with staff at behavioral health urgent care and left AMA.  Here aspect our department requesting transportation to [facility] rescue mission and his medications. He states to this provider that he lied about suicidality before as a means to obtain transportation to his desired facility interim.  Denies SI HI AVH at this time.  Sleeping calmly in his hospital bed tomorrow at the bedside., MDD, polysubstance use, homelessness.  HPI     Home Medications Prior to Admission medications   Medication Sig Start Date End Date Taking? Authorizing Provider  ALPRAZolam  (XANAX ) 1 MG tablet Take 1 mg by mouth 3 (three) times daily.    [provider]  baclofen  (LIORESAL ) 10 MG tablet Take 10 mg by mouth 2 (two) times daily.    [provider]  buprenorphine -naloxone  (SUBOXONE ) 8-2 mg SUBL SL tablet Place 2 tablets under the tongue daily.    [provider]  cariprazine  (VRAYLAR ) 1.5 MG capsule Take 1.5 mg by mouth in the morning and at bedtime.    [provider]  divalproex  (DEPAKOTE ) 500 MG DR tablet Take 1 tablet (500 mg total) by mouth every 12 (twelve) hours. 10/04/23   Zouev, Dmitri, MD  gabapentin  (NEURONTIN ) 800 MG tablet Take 800 mg by mouth 3 (three) times daily.    [provider]  hydrOXYzine  (ATARAX ) 25 MG tablet Take 1 tablet (25 mg total) by mouth every 6 (six) hours as needed (anxiety/agitation or CIWA < or = 10). Patient not taking: Reported on 10/06/2023 10/04/23   Zouev, Dmitri, MD   nicotine  (NICODERM CQ  - DOSED IN MG/24 HOURS) 21 mg/24hr patch Place 1 patch (21 mg total) onto the skin once as needed (nicotine  withdrawal). 10/04/23   Zouev, Dmitri, MD  nicotine  polacrilex (NICORETTE ) 2 MG gum Take 2 mg by mouth every 4 (four) hours while awake.    [provider]  risperiDONE  (RISPERDAL ) 1 MG tablet Take 1 tablet (1 mg total) by mouth 2 (two) times daily. Patient not taking: Reported on 10/06/2023 10/04/23 11/03/23  Zouev, Dmitri, MD  traZODone  (DESYREL ) 100 MG tablet Take 100 mg by mouth at bedtime.    [provider]      Allergies    Aleve  [naproxen  sodium], Aspirin, Ibuprofen , Seroquel [quetiapine fumerate], Tramadol , and Tylenol  [acetaminophen ]    Review of Systems   Review of Systems  All other systems reviewed and are negative.   Physical Exam Updated Vital Signs BP (!) 144/82 (BP Location: Right Arm)   Pulse 70   Temp 98.3 F (36.8 C)   Resp 17   SpO2 100%  Physical Exam Vitals and nursing note reviewed.  Constitutional:      Appearance: He is not ill-appearing or toxic-appearing.  HENT:     Head: Normocephalic and atraumatic.  Eyes:     General: No scleral icterus.       Right eye: No discharge.        Left eye: No discharge.     Conjunctiva/sclera: Conjunctivae normal.  Cardiovascular:     Rate  and Rhythm: Normal rate.     Heart sounds: Normal heart sounds.  Pulmonary:     Effort: Pulmonary effort is normal.  Skin:    General: Skin is warm and dry.  Neurological:     General: No focal deficit present.     Mental Status: He is alert.  Psychiatric:        Mood and Affect: Mood normal.     ED Results / Procedures / Treatments   Labs (all labs ordered are listed, but only abnormal results are displayed) Labs Reviewed - No data to display  EKG None  Radiology No results found.  Procedures Procedures    Medications Ordered in ED Medications - No data to display  ED Course/ Medical Decision Making/ A&P                                  Medical Decision Making 43 year old male who presents with request for transportation, malingering.  Normal vitals on intake.  Reassuring physical exam.  Patient clearly he has no acute medical concern, denies SI HI AVH.  States he is only here for a place to stay and transportation to his desired facility in [place].   Clinical concern for emergent underlying condition that would warrant further ED workup and patient management is exceedingly low.  Clinical picture most consistent with malingering for secondary gain of transportation.  [place] voiced understanding of her medical evaluation and treatment plan. Each of their questions answered to their expressed satisfaction.  Return precautions were given.  Patient is well-appearing, stable, and was discharged in good condition.  This chart was dictated using voice recognition software, Dragon. Despite the best efforts of this provider to proofread and correct errors, errors may still occur which can change documentation meaning.         Final Clinical Impression(s) / ED Diagnoses Final diagnoses:  Malingering    Rx / DC Orders ED Discharge Orders     None         Bobette Pleasant SAUNDERS, PA-C 10/07/23 0551    Emil Share, DO 10/07/23 0630

## 2023-10-10 ENCOUNTER — Emergency Department (HOSPITAL_COMMUNITY)
Admission: EM | Admit: 2023-10-10 | Discharge: 2023-10-10 | Disposition: A | Discharge status: Home / Self Care | Payer: Medicaid Other | Attending: Emergency Medicine | Admitting: Emergency Medicine

## 2023-10-10 ENCOUNTER — Emergency Department (HOSPITAL_COMMUNITY): Payer: Medicaid Other

## 2023-10-10 DIAGNOSIS — R072 Precordial pain: Secondary | ICD-10-CM | POA: Insufficient documentation

## 2023-10-10 DIAGNOSIS — R079 Chest pain, unspecified: Secondary | ICD-10-CM

## 2023-10-10 LAB — BASIC METABOLIC PANEL WITH GFR
Anion gap: 8 (ref 5–15)
BUN: 12 mg/dL (ref 6–20)
CO2: 27 mmol/L (ref 22–32)
Calcium: 9.4 mg/dL (ref 8.9–10.3)
Chloride: 106 mmol/L (ref 98–111)
Creatinine, Ser: 0.85 mg/dL (ref 0.61–1.24)
GFR, Estimated: >60 mL/min
Glucose, Bld: 106 mg/dL — ABNORMAL HIGH (ref 70–99)
Potassium: 4.4 mmol/L (ref 3.5–5.1)
Sodium: 141 mmol/L (ref 135–145)

## 2023-10-10 LAB — CBC
HCT: 41.8 % (ref 39.0–52.0)
Hemoglobin: 13.4 g/dL (ref 13.0–17.0)
MCH: 25.3 pg — ABNORMAL LOW (ref 26.0–34.0)
MCHC: 32.1 g/dL (ref 30.0–36.0)
MCV: 78.9 fL — ABNORMAL LOW (ref 80.0–100.0)
Platelets: 167 10*3/uL (ref 150–400)
RBC: 5.3 MIL/uL (ref 4.22–5.81)
RDW: 13.4 % (ref 11.5–15.5)
WBC: 7.3 10*3/uL (ref 4.0–10.5)
nRBC: 0 % (ref 0.0–0.2)

## 2023-10-10 LAB — TROPONIN I (HIGH SENSITIVITY)
Troponin I (High Sensitivity): <2 ng/L (ref <18)
Troponin I (High Sensitivity): 2 ng/L (ref <18)

## 2023-10-10 MED ORDER — OXYCODONE HCL 5 MG PO TABS
5.0000 mg | ORAL_TABLET | Freq: Once | ORAL | Status: AC
  Administered 2023-10-10: 5 mg via ORAL
  Filled 2023-10-10: qty 1

## 2023-10-10 MED ORDER — ALUM & MAG HYDROXIDE-SIMETH 200-200-20 MG/5ML PO SUSP
30.0000 mL | Freq: Once | ORAL | Status: AC
  Administered 2023-10-10: 30 mL via ORAL
  Filled 2023-10-10: qty 30

## 2023-10-10 MED ORDER — FAMOTIDINE 20 MG PO TABS
40.0000 mg | ORAL_TABLET | Freq: Once | ORAL | Status: AC
  Administered 2023-10-10: 40 mg via ORAL
  Filled 2023-10-10: qty 2

## 2023-10-10 NOTE — ED Provider Notes (Signed)
 [place] EMERGENCY DEPARTMENT AT [facility] Provider Note   CSN: [id number] Arrival date & time: 10/10/23  1422     History  Chief Complaint  Patient presents with   Chest Pain    Cole [name] is a 43 y.o. male presented to the ER substernal chest pain.  Patient ports been ongoing for "a long time".  He was seen at the [facility] emergency department yesterday per my review of external records and had a cardiac workup which was unremarkable, and then signed himself out.  Patient was given DuoNebs by EMS en route to the hospital today.  He is calm on my examination.  He does report burning substernal chest pressure  HPI     Home Medications Prior to Admission medications   Medication Sig Start Date End Date Taking? Authorizing Provider  ALPRAZolam  (XANAX ) 1 MG tablet Take 1 mg by mouth 3 (three) times daily.    [provider]  baclofen  (LIORESAL ) 10 MG tablet Take 10 mg by mouth 2 (two) times daily.    [provider]  buprenorphine -naloxone  (SUBOXONE ) 8-2 mg SUBL SL tablet Place 2 tablets under the tongue daily.    [provider]  cariprazine  (VRAYLAR ) 1.5 MG capsule Take 1.5 mg by mouth in the morning and at bedtime.    [provider]  divalproex  (DEPAKOTE ) 500 MG DR tablet Take 1 tablet (500 mg total) by mouth every 12 (twelve) hours. 10/04/23   Zouev, Dmitri, MD  gabapentin  (NEURONTIN ) 800 MG tablet Take 800 mg by mouth 3 (three) times daily.    [provider]  hydrOXYzine  (ATARAX ) 25 MG tablet Take 1 tablet (25 mg total) by mouth every 6 (six) hours as needed (anxiety/agitation or CIWA < or = 10). Patient not taking: Reported on 10/06/2023 10/04/23   Zouev, Dmitri, MD  nicotine  (NICODERM CQ  - DOSED IN MG/24 HOURS) 21 mg/24hr patch Place 1 patch (21 mg total) onto the skin once as needed (nicotine  withdrawal). 10/04/23   Zouev, Dmitri, MD  nicotine  polacrilex (NICORETTE ) 2 MG gum Take 2 mg by mouth every 4 (four) hours while  awake.    [provider]  risperiDONE  (RISPERDAL ) 1 MG tablet Take 1 tablet (1 mg total) by mouth 2 (two) times daily. Patient not taking: Reported on 10/06/2023 10/04/23 11/03/23  Zouev, Dmitri, MD  traZODone  (DESYREL ) 100 MG tablet Take 100 mg by mouth at bedtime.    [provider]      Allergies    Aleve  [naproxen  sodium], Aspirin, Ibuprofen , Seroquel [quetiapine fumerate], Tramadol , and Tylenol  [acetaminophen ]    Review of Systems   Review of Systems  Physical Exam Updated Vital Signs BP 127/81   Pulse (!) 56   Temp 97.6 F (36.4 C) (Oral)   Resp 16   SpO2 100%  Physical Exam Constitutional:      General: He is not in acute distress. HENT:     Head: Normocephalic and atraumatic.  Eyes:     Conjunctiva/sclera: Conjunctivae normal.     Pupils: Pupils are equal, round, and reactive to light.  Cardiovascular:     Rate and Rhythm: Normal rate and regular rhythm.  Pulmonary:     Effort: Pulmonary effort is normal. No respiratory distress.  Abdominal:     General: There is no distension.     Tenderness: There is no abdominal tenderness.  Skin:    General: Skin is warm and dry.  Neurological:     General: No focal deficit present.  Mental Status: He is alert. Mental status is at baseline.  Psychiatric:        Mood and Affect: Mood normal.        Behavior: Behavior normal.     ED Results / Procedures / Treatments   Labs (all labs ordered are listed, but only abnormal results are displayed) Labs Reviewed  BASIC METABOLIC PANEL - Abnormal; Notable for the following components:      Result Value   Glucose, Bld 106 (*)    All other components within normal limits  CBC - Abnormal; Notable for the following components:   MCV 78.9 (*)    MCH 25.3 (*)    All other components within normal limits  TROPONIN I (HIGH SENSITIVITY)  TROPONIN I (HIGH SENSITIVITY)    EKG EKG Interpretation Date/Time:  Monday October 10 2023 14:57:23 EST Ventricular Rate:   53 PR Interval:  198 QRS Duration:  96 QT Interval:  406 QTC Calculation: 380 R Axis:   99  Text Interpretation: Sinus bradycardia with marked sinus arrhythmia Rightward axis When compared with ECG of 06-Oct-2023 16:41, PREVIOUS ECG IS PRESENT No significant changes Confirmed by Jerald Molly [phone]) on 10/10/2023 6:34:20 PM  Radiology DG Chest 2 View Result Date: 10/10/2023 CLINICAL DATA:  Chest pain. EXAM: CHEST - 2 VIEW COMPARISON:  06/14/2017. FINDINGS: Bilateral lung fields are clear. Bilateral costophrenic angles are clear. Normal cardio-mediastinal silhouette. No acute osseous abnormalities. The soft tissues are within normal limits. IMPRESSION: No active cardiopulmonary disease. Electronically Signed   By: Beula Brunswick M.D.   On: 10/10/2023 16:22    Procedures Procedures    Medications Ordered in ED Medications  famotidine  (PEPCID ) tablet 40 mg (40 mg Oral Given 10/10/23 1933)  alum & mag hydroxide-simeth (MAALOX/MYLANTA) 200-200-20 MG/5ML suspension 30 mL (30 mLs Oral Given 10/10/23 1933)    ED Course/ Medical Decision Making/ A&P                                 Medical Decision Making Amount and/or Complexity of Data Reviewed Labs: ordered. Radiology: ordered.  Risk OTC drugs.   Patient is presenting to ED with chest pain.  His medical comorbidities include smoking, polysubstance use.  Chest pain differential is broad but include reflux gastritis most likely as etiology of his symptoms, versus pneumonia or pneumothorax versus cocaine induced chest pain or drug-induced pain versus other.  I have a lower suspicion for ACS or aortic dissection or pulmonary embolism based on this presentation.  I personally reviewed interpreted patient's labs, imaging and workup.  There are no emergent findings.  EKG does not appear ischemic per my review.  Chest x-ray with no large pneumothorax or infiltrate.  Patient was given GI medications suspected gastritis.  I did review  his external records including workup at [facility] hospital yesterday.  I would also note the patient has been seen extensively in her own emergency department, on a near weekly basis with a variety of medical complaints, but frequent complaint of chest pain, joint pain, SI, polysubstance use, behavioral health concerns.  He has noted have a history of malingering and conditional suicidality.  He has a history of homelessness.  At this time he is not psychotic, and I do not believe he is an imminent risk to his own health or the health of others.  He is calm and collected.  I do not believe he is requiring an emergent psychiatric  evaluation.\  Workup reviewed - no emergent findings. Stable for discharge        Final Clinical Impression(s) / ED Diagnoses Final diagnoses:  Chest pain, unspecified type    Rx / DC Orders ED Discharge Orders     None         Arvilla Birmingham, MD 10/10/23 2156

## 2023-10-10 NOTE — ED Triage Notes (Signed)
 Pt reports SHOB and chest pain. Pt received duoneb en route.  136/89 100% RA

## 2024-01-22 ENCOUNTER — Other Ambulatory Visit: Payer: Self-pay

## 2024-01-22 ENCOUNTER — Encounter (HOSPITAL_COMMUNITY): Payer: Self-pay | Admitting: Emergency Medicine

## 2024-01-22 ENCOUNTER — Emergency Department (HOSPITAL_COMMUNITY)
Admission: EM | Admit: 2024-01-22 | Discharge: 2024-01-23 | Disposition: A | Discharge status: Other Acute Inpatient Hospital | Attending: Emergency Medicine | Admitting: Emergency Medicine

## 2024-01-22 DIAGNOSIS — R45851 Suicidal ideations: Secondary | ICD-10-CM | POA: Diagnosis not present

## 2024-01-22 DIAGNOSIS — D696 Thrombocytopenia, unspecified: Secondary | ICD-10-CM | POA: Diagnosis not present

## 2024-01-22 DIAGNOSIS — K6289 Other specified diseases of anus and rectum: Secondary | ICD-10-CM | POA: Diagnosis not present

## 2024-01-22 DIAGNOSIS — J45909 Unspecified asthma, uncomplicated: Secondary | ICD-10-CM | POA: Insufficient documentation

## 2024-01-22 DIAGNOSIS — D509 Iron deficiency anemia, unspecified: Secondary | ICD-10-CM | POA: Diagnosis not present

## 2024-01-22 DIAGNOSIS — K5900 Constipation, unspecified: Secondary | ICD-10-CM | POA: Insufficient documentation

## 2024-01-22 DIAGNOSIS — F329 Major depressive disorder, single episode, unspecified: Secondary | ICD-10-CM | POA: Diagnosis present

## 2024-01-22 LAB — COMPREHENSIVE METABOLIC PANEL WITH GFR
ALT: 14 U/L (ref 0–44)
AST: 12 U/L — ABNORMAL LOW (ref 15–41)
Albumin: 3.7 g/dL (ref 3.5–5.0)
Alkaline Phosphatase: 39 U/L (ref 38–126)
Anion gap: 10 (ref 5–15)
BUN: 7 mg/dL (ref 6–20)
CO2: 24 mmol/L (ref 22–32)
Calcium: 8.7 mg/dL — ABNORMAL LOW (ref 8.9–10.3)
Chloride: 107 mmol/L (ref 98–111)
Creatinine, Ser: 0.98 mg/dL (ref 0.61–1.24)
GFR, Estimated: >60 mL/min (ref >60)
Glucose, Bld: 89 mg/dL (ref 70–99)
Potassium: 3.5 mmol/L (ref 3.5–5.1)
Sodium: 141 mmol/L (ref 135–145)
Total Bilirubin: 0.5 mg/dL (ref 0.0–1.2)
Total Protein: 6.7 g/dL (ref 6.5–8.1)

## 2024-01-22 LAB — CBC
HCT: 38.6 % — ABNORMAL LOW (ref 39.0–52.0)
Hemoglobin: 12.8 g/dL — ABNORMAL LOW (ref 13.0–17.0)
MCH: 25.8 pg — ABNORMAL LOW (ref 26.0–34.0)
MCHC: 33.2 g/dL (ref 30.0–36.0)
MCV: 77.7 fL — ABNORMAL LOW (ref 80.0–100.0)
Platelets: 147 10*3/uL — ABNORMAL LOW (ref 150–400)
RBC: 4.97 MIL/uL (ref 4.22–5.81)
RDW: 13.6 % (ref 11.5–15.5)
WBC: 5 10*3/uL (ref 4.0–10.5)
nRBC: 0 % (ref 0.0–0.2)

## 2024-01-22 LAB — ETHANOL: Alcohol, Ethyl (B): 17 mg/dL — ABNORMAL HIGH (ref <15)

## 2024-01-22 MED ORDER — NICOTINE 14 MG/24HR TD PT24
14.0000 mg | MEDICATED_PATCH | Freq: Every day | TRANSDERMAL | Status: DC
  Administered 2024-01-23: 14 mg via TRANSDERMAL
  Filled 2024-01-22: qty 1

## 2024-01-22 MED ORDER — ONDANSETRON HCL 4 MG PO TABS: 4.0000 mg | ORAL_TABLET | Freq: Three times a day (TID) | ORAL | Status: DC | PRN

## 2024-01-22 MED ORDER — ALUM & MAG HYDROXIDE-SIMETH 200-200-20 MG/5ML PO SUSP: 30.0000 mL | Freq: Four times a day (QID) | ORAL | Status: DC | PRN

## 2024-01-22 MED ORDER — OXYCODONE HCL 5 MG PO TABS
10.0000 mg | ORAL_TABLET | Freq: Once | ORAL | Status: AC
  Administered 2024-01-23: 10 mg via ORAL
  Filled 2024-01-22: qty 2

## 2024-01-22 NOTE — ED Notes (Signed)
 Lab getting blood at this time-pt has been wanded by security

## 2024-01-22 NOTE — ED Triage Notes (Signed)
 Pt states he has hemorrhoids and that his "bottom is leaking". Doesn't specifically state if leakage is blood or clear fluid. Pt also endorses SI.

## 2024-01-22 NOTE — ED Notes (Signed)
 Pt homeless, pt asking for food and drink- EDP has not evaluated pt yet- informed pt per policy we cannot give food or beverage until pt has been seen by EDP

## 2024-01-22 NOTE — ED Provider Notes (Signed)
 [place] [facility] AT [facility] Provider Note   CSN: [id number] Arrival date & time: 01/22/24  1947     History {Add pertinent medical, surgical, social history, OB history to HPI:1} Chief Complaint  Patient presents with   Hemorrhoids   V70.1    NORWOOD QUEZADA is a 43 y.o. male.  The history is provided by the patient.  He has history of bipolar disorder, asthma, homelessness and comes in complaining of suicidal ideation for the last 2-3 days.  He thought he would cut his wrist.  He does endorse chronic depression which has gotten worse but he cannot explain what got his depression worse.  He denies crying spells but does endorse early morning wakening and anhedonia.  He endorses occasional nondescript hallucinations but denies any command hallucinations.  He denies illicit drug use except for marijuana.  He is also complaining of constipation and hemorrhoids.  Hemorrhoid has had associated pain and itching and bleeding.  Of note, he does endorse ongoing homelessness.   Home Medications Prior to Admission medications   Medication Sig Start Date End Date Taking? Authorizing Provider  ALPRAZolam  (XANAX ) 1 MG tablet Take 1 mg by mouth 3 (three) times daily.    [provider]  baclofen  (LIORESAL ) 10 MG tablet Take 10 mg by mouth 2 (two) times daily.    [provider]  buprenorphine -naloxone  (SUBOXONE ) 8-2 mg SUBL SL tablet Place 2 tablets under the tongue daily.    [provider]  cariprazine  (VRAYLAR ) 1.5 MG capsule Take 1.5 mg by mouth in the morning and at bedtime.    [provider]  divalproex  (DEPAKOTE ) 500 MG DR tablet Take 1 tablet (500 mg total) by mouth every 12 (twelve) hours. 10/04/23   Zouev, Dmitri, MD  gabapentin  (NEURONTIN ) 800 MG tablet Take 800 mg by mouth 3 (three) times daily.    [provider]  hydrOXYzine  (ATARAX ) 25 MG tablet Take 1 tablet (25 mg total) by mouth every 6 (six) hours as needed  (anxiety/agitation or CIWA < or = 10). Patient not taking: Reported on 10/06/2023 10/04/23   Zouev, Dmitri, MD  nicotine  (NICODERM CQ  - DOSED IN MG/24 HOURS) 21 mg/24hr patch Place 1 patch (21 mg total) onto the skin once as needed (nicotine  withdrawal). 10/04/23   Zouev, Dmitri, MD  nicotine  polacrilex (NICORETTE ) 2 MG gum Take 2 mg by mouth every 4 (four) hours while awake.    [provider]  risperiDONE  (RISPERDAL ) 1 MG tablet Take 1 tablet (1 mg total) by mouth 2 (two) times daily. Patient not taking: Reported on 10/06/2023 10/04/23 11/03/23  Zouev, Dmitri, MD  traZODone  (DESYREL ) 100 MG tablet Take 100 mg by mouth at bedtime.    [provider]      Allergies    Aleve  [naproxen  sodium], Aspirin, Ibuprofen , Seroquel [quetiapine fumerate], Tramadol , and Tylenol  [acetaminophen ]    Review of Systems   Review of Systems  All other systems reviewed and are negative.   Physical Exam Updated Vital Signs BP 135/80   Pulse 70   Temp 98.9 F (37.2 C)   Resp 17   Ht 5\' 9"  (1.753 m)   Wt 81.6 kg   SpO2 99%   BMI 26.58 kg/m  Physical Exam Vitals and nursing note reviewed.   43 year old male, resting comfortably and in no acute distress. Vital signs are normal. Oxygen saturation is 99%, which is normal. Head is normocephalic and atraumatic. PERRLA, EOMI.  Lungs are clear without  rales, wheezes, or rhonchi. Heart has regular rate and rhythm without murmur. Abdomen is soft, flat, nontender. Rectal: ***. Skin is warm and dry without rash. Neurologic: Awake, alert, moves all extremities equally. Psychiatric: Slightly depressed affect, does not appear to be responding to internal stimuli.  ED Results / Procedures / Treatments   Labs (all labs ordered are listed, but only abnormal results are displayed) Labs Reviewed  COMPREHENSIVE METABOLIC PANEL WITH GFR - Abnormal; Notable for the following components:      Result Value   Calcium 8.7 (*)    AST 12 (*)    All other  components within normal limits  ETHANOL - Abnormal; Notable for the following components:   Alcohol, Ethyl (B) 17 (*)    All other components within normal limits  CBC - Abnormal; Notable for the following components:   Hemoglobin 12.8 (*)    HCT 38.6 (*)    MCV 77.7 (*)    MCH 25.8 (*)    Platelets 147 (*)    All other components within normal limits  RAPID URINE DRUG SCREEN, HOSP PERFORMED    EKG None  Radiology No results found.  Procedures Procedures  {Document cardiac monitor, telemetry assessment procedure when appropriate:1}  Medications Ordered in ED Medications - No data to display  ED Course/ Medical Decision Making/ A&P   {   Click here for ABCD2, HEART and other calculatorsREFRESH Note before signing :1}                              Medical Decision Making Amount and/or Complexity of Data Reviewed Labs: ordered.   Depression with suicidal ideation.  Chronic homelessness.  Hemorrhoids with chronic constipation.  I have reviewed his laboratory tests, and my interpretation is mild microcytic anemia, borderline thrombocytopenia of uncertain clinical significance, normal comprehensive metabolic panel, ethanol level well below the legal limit of intoxication.  Drug screen is pending.  I am requesting TTS consultation.  {Document critical care time when appropriate:1} {Document review of labs and clinical decision tools ie heart score, Chads2Vasc2 etc:1}  {Document your independent review of radiology images, and any outside records:1} {Document your discussion with family members, caretakers, and with consultants:1} {Document social determinants of health affecting pt's care:1} {Document your decision making why or why not admission, treatments were needed:1} Final Clinical Impression(s) / ED Diagnoses Final diagnoses:  None    Rx / DC Orders ED Discharge Orders     None

## 2024-01-22 NOTE — ED Notes (Signed)
 Pt wanded by security.

## 2024-01-23 ENCOUNTER — Other Ambulatory Visit (HOSPITAL_COMMUNITY)
Admission: EM | Admit: 2024-01-23 | Discharge: 2024-01-26 | Disposition: A | Discharge status: Home / Self Care | Attending: Psychiatry | Admitting: Psychiatry

## 2024-01-23 DIAGNOSIS — F17210 Nicotine dependence, cigarettes, uncomplicated: Secondary | ICD-10-CM | POA: Insufficient documentation

## 2024-01-23 DIAGNOSIS — F1120 Opioid dependence, uncomplicated: Secondary | ICD-10-CM | POA: Insufficient documentation

## 2024-01-23 DIAGNOSIS — F1920 Other psychoactive substance dependence, uncomplicated: Secondary | ICD-10-CM

## 2024-01-23 DIAGNOSIS — Z79899 Other long term (current) drug therapy: Secondary | ICD-10-CM | POA: Insufficient documentation

## 2024-01-23 DIAGNOSIS — Z818 Family history of other mental and behavioral disorders: Secondary | ICD-10-CM | POA: Insufficient documentation

## 2024-01-23 DIAGNOSIS — Z5900 Homelessness unspecified: Secondary | ICD-10-CM | POA: Insufficient documentation

## 2024-01-23 DIAGNOSIS — F332 Major depressive disorder, recurrent severe without psychotic features: Secondary | ICD-10-CM | POA: Diagnosis present

## 2024-01-23 DIAGNOSIS — F4310 Post-traumatic stress disorder, unspecified: Secondary | ICD-10-CM | POA: Diagnosis present

## 2024-01-23 DIAGNOSIS — F419 Anxiety disorder, unspecified: Secondary | ICD-10-CM | POA: Insufficient documentation

## 2024-01-23 DIAGNOSIS — K5900 Constipation, unspecified: Secondary | ICD-10-CM | POA: Insufficient documentation

## 2024-01-23 DIAGNOSIS — F1420 Cocaine dependence, uncomplicated: Secondary | ICD-10-CM | POA: Diagnosis present

## 2024-01-23 DIAGNOSIS — F1220 Cannabis dependence, uncomplicated: Secondary | ICD-10-CM | POA: Diagnosis present

## 2024-01-23 DIAGNOSIS — F1910 Other psychoactive substance abuse, uncomplicated: Secondary | ICD-10-CM | POA: Diagnosis present

## 2024-01-23 DIAGNOSIS — Z765 Malingerer [conscious simulation]: Secondary | ICD-10-CM

## 2024-01-23 DIAGNOSIS — F1320 Sedative, hypnotic or anxiolytic dependence, uncomplicated: Secondary | ICD-10-CM | POA: Diagnosis present

## 2024-01-23 DIAGNOSIS — F32A Depression, unspecified: Secondary | ICD-10-CM | POA: Insufficient documentation

## 2024-01-23 DIAGNOSIS — G4700 Insomnia, unspecified: Secondary | ICD-10-CM | POA: Diagnosis present

## 2024-01-23 DIAGNOSIS — F259 Schizoaffective disorder, unspecified: Secondary | ICD-10-CM | POA: Insufficient documentation

## 2024-01-23 DIAGNOSIS — G8929 Other chronic pain: Secondary | ICD-10-CM | POA: Diagnosis not present

## 2024-01-23 DIAGNOSIS — F250 Schizoaffective disorder, bipolar type: Secondary | ICD-10-CM | POA: Diagnosis present

## 2024-01-23 DIAGNOSIS — F411 Generalized anxiety disorder: Secondary | ICD-10-CM | POA: Diagnosis present

## 2024-01-23 LAB — RAPID URINE DRUG SCREEN, HOSP PERFORMED
Amphetamines: NOT DETECTED
Barbiturates: NOT DETECTED
Benzodiazepines: NOT DETECTED
Cocaine: POSITIVE — AB
Opiates: NOT DETECTED
Tetrahydrocannabinol: POSITIVE — AB

## 2024-01-23 MED ORDER — HYDROCORTISONE (PERIANAL) 2.5 % EX CREA
TOPICAL_CREAM | Freq: Two times a day (BID) | CUTANEOUS | Status: DC
  Administered 2024-01-23 – 2024-01-25 (×3): 1 via RECTAL
  Filled 2024-01-23: qty 28.35

## 2024-01-23 MED ORDER — ALUM & MAG HYDROXIDE-SIMETH 200-200-20 MG/5ML PO SUSP: 30.0000 mL | ORAL | Status: DC | PRN

## 2024-01-23 MED ORDER — NICOTINE POLACRILEX 2 MG MT GUM
2.0000 mg | CHEWING_GUM | OROMUCOSAL | Status: DC | PRN
  Administered 2024-01-24 – 2024-01-26 (×4): 2 mg via ORAL
  Filled 2024-01-23 (×3): qty 1

## 2024-01-23 MED ORDER — HYDROXYZINE HCL 25 MG PO TABS: 25.0000 mg | ORAL_TABLET | Freq: Four times a day (QID) | ORAL | Status: DC | PRN

## 2024-01-23 MED ORDER — DIVALPROEX SODIUM 250 MG PO DR TAB
500.0000 mg | DELAYED_RELEASE_TABLET | Freq: Two times a day (BID) | ORAL | Status: DC
  Administered 2024-01-23: 500 mg via ORAL
  Filled 2024-01-23: qty 2

## 2024-01-23 MED ORDER — CARIPRAZINE HCL 1.5 MG PO CAPS: 1.5000 mg | ORAL_CAPSULE | Freq: Every day | ORAL | Status: DC

## 2024-01-23 MED ORDER — HALOPERIDOL LACTATE 5 MG/ML IJ SOLN: 5.0000 mg | Freq: Three times a day (TID) | INTRAMUSCULAR | Status: DC | PRN

## 2024-01-23 MED ORDER — DIPHENHYDRAMINE HCL 50 MG PO CAPS: 50.0000 mg | ORAL_CAPSULE | Freq: Three times a day (TID) | ORAL | Status: DC | PRN

## 2024-01-23 MED ORDER — DIPHENHYDRAMINE HCL 50 MG/ML IJ SOLN: 50.0000 mg | Freq: Three times a day (TID) | INTRAMUSCULAR | Status: DC | PRN

## 2024-01-23 MED ORDER — RISPERIDONE 1 MG PO TABS
1.0000 mg | ORAL_TABLET | Freq: Two times a day (BID) | ORAL | Status: DC
  Administered 2024-01-23: 1 mg via ORAL
  Filled 2024-01-23: qty 1

## 2024-01-23 MED ORDER — TRAZODONE HCL 50 MG PO TABS
50.0000 mg | ORAL_TABLET | Freq: Every evening | ORAL | Status: DC | PRN
  Administered 2024-01-23 – 2024-01-24 (×2): 50 mg via ORAL
  Filled 2024-01-23 (×2): qty 1

## 2024-01-23 MED ORDER — BUPRENORPHINE HCL-NALOXONE HCL 8-2 MG SL SUBL
2.0000 | SUBLINGUAL_TABLET | Freq: Every day | SUBLINGUAL | Status: DC
  Administered 2024-01-23: 2 via SUBLINGUAL
  Filled 2024-01-23: qty 2

## 2024-01-23 MED ORDER — MAGNESIUM HYDROXIDE 400 MG/5ML PO SUSP: 30.0000 mL | Freq: Every day | ORAL | Status: DC | PRN

## 2024-01-23 MED ORDER — HALOPERIDOL 5 MG PO TABS: 5.0000 mg | ORAL_TABLET | Freq: Three times a day (TID) | ORAL | Status: DC | PRN

## 2024-01-23 MED ORDER — ALPRAZOLAM 0.5 MG PO TABS
1.0000 mg | ORAL_TABLET | Freq: Three times a day (TID) | ORAL | Status: DC
  Administered 2024-01-23: 1 mg via ORAL
  Filled 2024-01-23: qty 2

## 2024-01-23 MED ORDER — HYDROXYZINE HCL 25 MG PO TABS: 25.0000 mg | ORAL_TABLET | Freq: Three times a day (TID) | ORAL | Status: DC | PRN

## 2024-01-23 MED ORDER — LORAZEPAM 2 MG/ML IJ SOLN: 2.0000 mg | Freq: Three times a day (TID) | INTRAMUSCULAR | Status: DC | PRN

## 2024-01-23 MED ORDER — TRAZODONE HCL 50 MG PO TABS: 100.0000 mg | ORAL_TABLET | Freq: Every day | ORAL | Status: DC

## 2024-01-23 MED ORDER — GABAPENTIN 400 MG PO CAPS
800.0000 mg | ORAL_CAPSULE | Freq: Three times a day (TID) | ORAL | Status: DC
  Administered 2024-01-23: 800 mg via ORAL
  Filled 2024-01-23: qty 2

## 2024-01-23 MED ORDER — HALOPERIDOL LACTATE 5 MG/ML IJ SOLN: 10.0000 mg | Freq: Three times a day (TID) | INTRAMUSCULAR | Status: DC | PRN

## 2024-01-23 MED ORDER — BACLOFEN 10 MG PO TABS
10.0000 mg | ORAL_TABLET | Freq: Two times a day (BID) | ORAL | Status: DC
  Administered 2024-01-23: 10 mg via ORAL
  Filled 2024-01-23: qty 1

## 2024-01-23 NOTE — ED Provider Notes (Signed)
 Patient has been accepted to the [facility] facility based crisis unit. Accepting MD is Dr. Kathlen Para. Patient is voluntary and stable for transfer.   EKG Interpretation Date/Time:  Monday Jan 23 2024 08:39:58 EDT Ventricular Rate:  47 PR Interval:  222 QRS Duration:  96 QT Interval:  446 QTC Calculation: 394 R Axis:   95  Text Interpretation: Sinus bradycardia with 1st degree A-V block Rightward axis  no significant change since Feb 2025 Confirmed by Jerilynn Montenegro [phone]) on 01/23/2024 9:44:18 AM          Jerilynn Montenegro, MD 01/23/24 [phone]

## 2024-01-23 NOTE — ED Notes (Signed)
 Patient admitted to [facility] from [facility] for observation and detox.  Patient using etoh, cocaine and THC.  He has history of bipolar disorder.  He is homeless and does not have a job.  Patient known to unit.  He is calm and cooperative with admission process.  He was oriented to unit, shown to his room and given a meal.  Patient then showered and is now making a phone call.  He is without withdrawal symptoms and denies avh shi or plan.  Will monitor.

## 2024-01-23 NOTE — ED Notes (Signed)
Belongings transported with pt 

## 2024-01-23 NOTE — ED Notes (Signed)
Pt ate two containers of applesauce.

## 2024-01-23 NOTE — ED Notes (Signed)
Call placed to [employer]

## 2024-01-23 NOTE — ED Notes (Signed)
Pa 

## 2024-01-23 NOTE — ED Notes (Addendum)
 Prior to pulling meds, this RN advised pt which meds are due, pt agrees with listed meds, pt scanned, meds scanned and added to cup, pt then picks out 4 pills that he states he does not want, pt states the meds are depakote , baclofen , and risperidone 

## 2024-01-23 NOTE — ED Notes (Signed)
Pt allowed phone call   

## 2024-01-23 NOTE — BH Assessment (Signed)
 TTS consult will be completed by IRIS. IRIS Coordinator will communicate in established secure chat assessment time and provider name. Thanks

## 2024-01-23 NOTE — BH Assessment (Signed)
 Comprehensive Clinical Assessment (CCA) Note  01/23/2024 FREDDERICK [name] [id number]  Disposition:  Cole Jaffe, NP, recommends inpatient treatment. Disposition SW to secure placement in the AM.   Chief Complaint:  Chief Complaint  Patient presents with   Hemorrhoids   V70.1   Suicidal   Visit Diagnosis:  Major depressive disorder   The patient demonstrates the following risk factors for suicide: Chronic risk factors for suicide include: psychiatric disorder of bipolar. Acute risk factors for suicide include: family or marital conflict, unemployment, social withdrawal/isolation, and loss (financial, interpersonal, professional). Protective factors for this patient include: positive therapeutic relationship, responsibility to others (children, family), and hope for the future. Considering these factors, the overall suicide risk at this point appears to be high. Patient is not appropriate for outpatient follow up.  Cole [name] is a 43 year old male presenting voluntary to [facility] due to SI with plan to cut himself. Patient has history of bipolar disorder. Patient denied HI, psychosis and alcohol/drug usage.   Patient admits to [facility] for the past 2 days. Patient reports stressors/triggers include "no job, no income and no where to stay". Patient reports worsening depressive symptoms. Patient denied prior suicide attempts and self-harming behaviors. Patient reports poor sleep "up and down" and poor appetite for the past couple of days.   Patient is currently being seen by [name] for therapy and medication management with next appointment on 02/07/24. Patient feels that psych medications are working.   Patient reports he is currently homeless. Patient has one adult child and reports "we talk every now and then". Patient reports good relationship with his mother, whom is trying to help him, however patient states "I will start something and don't finish it, she really wants me to  be able to have my own place". Patient is currently unemployed and reports he is trying to get approved for disability. Patient denied direct access to guns. Patient was calm and cooperative during assessment. Patient is unable to contract for safety. Patient is requesting to be linked with social worker to help him move to a shelter in [place].    CCA Screening, Triage and Referral (STR)  Patient Reported Information How did you hear about us ? Self  What Is the Reason for Your Visit/Call Today? SI with plan to cut self.  How Long Has This Been Causing You Problems? 1-6 months  What Do You Feel Would Help You the Most Today? Treatment for Depression or other mood problem   Have You Recently Had Any Thoughts About Hurting Yourself? Yes  Are You Planning to Commit Suicide/Harm Yourself At This time? No   Flowsheet Row ED from 01/22/2024 in [name] at [name] ED from 10/10/2023 in [facility] Department at [name] ED from 10/07/2023 in [facility] at [name]  C-SSRS RISK CATEGORY High Risk No Risk No Risk       Have you Recently Had Thoughts About Hurting Someone Cole [name]? No  Are You Planning to Harm Someone at This Time? No  Explanation: n/a   Have You Used Any Alcohol or Drugs in the Past 24 Hours? No  How Long Ago Did You Use Drugs or Alcohol? N/a What Did You Use and How Much? NA   Do You Currently Have a Therapist/Psychiatrist? Yes  Name of Therapist/Psychiatrist: Name of Therapist/Psychiatrist: [name], counseling and medication management   Have You Been Recently Discharged From Any Office Practice or Programs? No  Explanation of Discharge From Practice/Program: N/A     CCA Screening Triage Referral Assessment Type of Contact: Tele-Assessment  Telemedicine Service Delivery: Telemedicine service delivery: This service was provided via telemedicine using a  2-way, interactive audio and video technology  Is this Initial or Reassessment? Is this Initial or Reassessment?: Initial Assessment  Date Telepsych consult ordered in [facility]:  Date Telepsych consult ordered in [facility]: 01/22/24  Time Telepsych consult ordered in [facility]:  Time Telepsych consult ordered in [facility]: 2358  Location of Assessment: [facility] ED  Provider Location: [name]   Collateral Involvement: No collateral involved.   Does Patient Have a Automotive engineer Guardian? No  Legal Guardian Contact Information: n/a  Copy of Legal Guardianship Form: -- (n/a)  Legal Guardian Notified of Arrival: -- (n/a)  Legal Guardian Notified of Pending Discharge: -- (n/a)  If Minor and Not Living with Parent(s), Who has Custody? n/a  Is CPS involved or ever been involved? Never  Is APS involved or ever been involved? Never   Patient Determined To Be At Risk for Harm To Self or Others Based on Review of Patient Reported Information or Presenting Complaint? Yes, for Self-Harm  Method: Plan with intent and identified person (Cutting his wrist)  Availability of Means: In hand or used (cutting his wrist)  Intent: Clearly intends on inflicting harm that could cause death  Notification Required: No need or identified person  Additional Information for Danger to Others Potential: -- (N/A)  Additional Comments for Danger to Others Potential: N/A  Are There Guns or Other Weapons in Your Home? No (no guns, however knows where to go get one)  Types of Guns/Weapons: n/a  Are These Weapons Safely Secured?                            -- (n/a)  Who Could Verify You Are Able To Have These Secured: n/a  Do You Have any Outstanding Charges, Pending Court Dates, Parole/Probation? none reported  Contacted To Inform of Risk of Harm To Self or Others: Family/Significant Other:    Does Patient Present under Involuntary Commitment? No    [place] of Residence: [place]   Patient  Currently Receiving the Following Services: Medication Management; Individual Therapy   Determination of Need: Emergent (2 hours)   Options For Referral: Medication Management; Inpatient Hospitalization; BH Urgent Care     CCA Biopsychosocial Patient Reported Schizophrenia/Schizoaffective Diagnosis in Past: No   Strengths: self-awareness   Mental Health Symptoms Depression:  Difficulty Concentrating; Sleep (too much or little); Hopelessness; Increase/decrease in appetite; Fatigue   Duration of Depressive symptoms: Duration of Depressive Symptoms: Greater than two weeks   Mania:  None   Anxiety:   Tension; Worrying; Restlessness; Fatigue; Sleep   Psychosis:  None   Duration of Psychotic symptoms:    Trauma:  None   Obsessions:  None   Compulsions:  "Driven" to perform behaviors/acts; Disrupts with routine/functioning; Poor Insight   Inattention:  Disorganized; Forgetful; Loses things   Hyperactivity/Impulsivity:  N/A   Oppositional/Defiant Behaviors:  None   Emotional Irregularity:  Recurrent suicidal behaviors/gestures/threats   Other Mood/Personality Symptoms:  Depression symptoms.    Mental Status Exam Appearance and self-care  Stature:  Average   Weight:  Average weight   Clothing:  Casual (Pt dressed in scrubs.)   Grooming:  Normal   Cosmetic use:  None   Posture/gait:  Normal   Motor activity:  Restless   Sensorium  Attention:  Normal   Concentration:  Normal   Orientation:  X5   Recall/memory:  Normal   Affect and Mood  Affect:  Depressed   Mood:  Hopeless; Depressed   Relating  Eye contact:  Normal   Facial expression:  Depressed; Sad   Attitude toward examiner:  Cooperative   Thought and Language  Speech flow: Normal   Thought content:  Appropriate to Mood and Circumstances   Preoccupation:  None   Hallucinations:  None   Organization:  Coherent   [employer] of Knowledge:  Fair   Intelligence:   Average   Abstraction:  Functional   Judgement:  Poor; Impaired   Reality Testing:  Realistic   Insight:  Lacking   Decision Making:  Impulsive   Social Functioning  Social Maturity:  Isolates   Social Judgement:  "Street Smart"   Stress  Stressors:  Housing; Surveyor, quantity; Grief/losses   Coping Ability:  Overwhelmed; Deficient supports   Skill Deficits:  Decision making; Responsibility; Interpersonal; Self-control; Self-care   Supports:  Friends/Service system; Support needed     Religion: Religion/Spirituality Are You A Religious Person?: Yes How Might This Affect Treatment?: None.  Leisure/Recreation: Leisure / Recreation Do You Have Hobbies?: Yes Leisure and Hobbies: "side jobs"  Exercise/Diet: Exercise/Diet Do You Exercise?: No Have You Gained or Lost A Significant Amount of Weight in the Past Six Months?: No Do You Follow a Special Diet?: No Do You Have Any Trouble Sleeping?: Yes Explanation of Sleeping Difficulties: "up and down"   CCA Employment/Education Employment/Work Situation: Employment / Work Situation Employment Situation: Unemployed Patient's Job has Been Impacted by Current Illness: No Has Patient ever Been in Equities trader?: No  Education: Education Is Patient Currently Attending School?: No Last Grade Completed: 11 Did You Product manager?: No Did You Have An Individualized Education Program (IIEP): No Did You Have Any Difficulty At [employer]?: No Patient's Education Has Been Impacted by Current Illness: No   CCA Family/Childhood History Family and Relationship History: Family history Marital status: Single Does patient have children?: Yes How many children?: 1 How is patient's relationship with their children?: "we talk every now and then"  Childhood History:  Childhood History By whom was/is the patient raised?: Mother, Other (Comment) (Patient reports also being raised by his mother's sister) Did patient suffer any  verbal/emotional/physical/sexual abuse as a child?: No Did patient suffer from severe childhood neglect?: No Has patient ever been sexually abused/assaulted/raped as an adolescent or adult?: No Was the patient ever a victim of a crime or a disaster?: No Witnessed domestic violence?: Yes Has patient been affected by domestic violence as an adult?: No Description of domestic violence: uta       CCA Substance Use Alcohol/Drug Use: Alcohol / Drug Use Pain Medications: See MAR Prescriptions: See MAR Over the Counter: See MAR History of alcohol / drug use?: Yes Longest period of sobriety (when/how long): history Negative Consequences of Use: Legal, Financial Withdrawal Symptoms: None                         ASAM's:  Six Dimensions of Multidimensional Assessment  Dimension 1:  Acute Intoxication and/or Withdrawal Potential:   Dimension 1:  Description of individual's past and current experiences of substance use and withdrawal: History of cocaine usage.  Dimension 2:  Biomedical Conditions and Complications:   Dimension 2:  Description of patient's biomedical conditions and  complications: None.  Dimension 3:  Emotional, Behavioral, or  Cognitive Conditions and Complications:  Dimension 3:  Description of emotional, behavioral, or cognitive conditions and complications: SI and depression.  Dimension 4:  Readiness to Change:  Dimension 4:  Description of Readiness to Change criteria: Pt reports rehab history, he wants to be admitted to rebab facility for 10-20 days.  Dimension 5:  Relapse, Continued use, or Continued Problem Potential:  Dimension 5:  Relapse, continued use, or continued problem potential critiera description: Patient denied substance abuse  Dimension 6:  Recovery/Living Environment:  Dimension 6:  Recovery/Iiving environment criteria description: Pt reports, he lives on the street, he's unable to live with family and friends.  ASAM Severity Score: ASAM's Severity  Rating Score: 10  ASAM Recommended Level of Treatment: ASAM Recommended Level of Treatment: Level II Intensive Outpatient Treatment   Substance use Disorder (SUD) Substance Use Disorder (SUD)  Checklist Symptoms of Substance Use: Continued use despite having a persistent/recurrent physical/psychological problem caused/exacerbated by use, Continued use despite persistent or recurrent social, interpersonal problems, caused or exacerbated by use, Substance(s) often taken in larger amounts or over longer times than was intended, Social, occupational, recreational activities given up or reduced due to use, Recurrent use that results in a failure to fulfill major role obligations (work, school, home)  Recommendations for Services/Supports/Treatments: Recommendations for Services/Supports/Treatments Recommendations For Services/Supports/Treatments: Inpatient Hospitalization, Individual Therapy, Medication Management (Pt to be observed and reassessed by psychiatry.)  Disposition Recommendation per psychiatric provider:  Recommends psychiatric inpatient treatment.    DSM5 Diagnoses: Patient Active Problem List   Diagnosis Date Noted   Cocaine use disorder, severe, dependence (HCC) 10/06/2023   Cannabis use disorder, severe, dependence (HCC) 10/06/2023   Tardive dyskinesia 02/24/2023   Schizoaffective disorder, bipolar type (HCC) 02/20/2023   Homelessness 12/13/2022   Polysubstance abuse (HCC) 12/13/2022   Substance induced mood disorder (HCC) 12/13/2022   Suicidal ideation 12/13/2022   Severe recurrent major depression (HCC) 07/16/2017   Major depressive disorder, recurrent episode, moderate (HCC) 08/13/2015   Opioid use disorder, severe, dependence (HCC) per history 08/13/2015   Benzodiazepine abuse (HCC) 08/13/2015   Tobacco use disorder 08/13/2015   Hepatitis C antibody test positive 01/22/2015   PTSD (post-traumatic stress disorder) 01/20/2015   Stimulant use disorder (HCC) 01/20/2015    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

## 2024-01-23 NOTE — ED Notes (Signed)
 Pt given crackers and water.

## 2024-01-24 DIAGNOSIS — Z765 Malingerer [conscious simulation]: Secondary | ICD-10-CM

## 2024-01-24 DIAGNOSIS — F17210 Nicotine dependence, cigarettes, uncomplicated: Secondary | ICD-10-CM | POA: Diagnosis not present

## 2024-01-24 DIAGNOSIS — F1320 Sedative, hypnotic or anxiolytic dependence, uncomplicated: Secondary | ICD-10-CM | POA: Diagnosis present

## 2024-01-24 DIAGNOSIS — F32A Depression, unspecified: Secondary | ICD-10-CM | POA: Diagnosis not present

## 2024-01-24 DIAGNOSIS — F1120 Opioid dependence, uncomplicated: Secondary | ICD-10-CM | POA: Diagnosis not present

## 2024-01-24 DIAGNOSIS — G8929 Other chronic pain: Secondary | ICD-10-CM | POA: Diagnosis not present

## 2024-01-24 MED ORDER — GABAPENTIN 300 MG PO CAPS
600.0000 mg | ORAL_CAPSULE | Freq: Three times a day (TID) | ORAL | Status: DC
  Administered 2024-01-24 – 2024-01-26 (×7): 600 mg via ORAL
  Filled 2024-01-24 (×7): qty 2

## 2024-01-24 MED ORDER — BUPRENORPHINE HCL 8 MG SL SUBL
8.0000 mg | SUBLINGUAL_TABLET | Freq: Two times a day (BID) | SUBLINGUAL | Status: DC
  Administered 2024-01-24 – 2024-01-26 (×5): 8 mg via SUBLINGUAL
  Filled 2024-01-24 (×5): qty 1

## 2024-01-24 MED ORDER — LORAZEPAM 1 MG PO TABS: 1.0000 mg | ORAL_TABLET | Freq: Four times a day (QID) | ORAL | Status: DC | PRN

## 2024-01-24 NOTE — ED Notes (Signed)
 Patient has been calm and cooperative with care and is easily engaged.  No distress.  He does appear anxious at times pacing back and forth.  He has been working on finding aftercare for himself and has asked for resources to be printed for him and he has been accommodated.  Patient denies avh shi or plan.  No evidence of withdrawal with ciwa being 0.  Will monitor.

## 2024-01-24 NOTE — Discharge Planning (Signed)
 LCSW met with patient to assess current mood, affect, physical state, and inquire about needs/goals while here in [facility] and after discharge. Patient reports he presented to [facility] with SI and MDD with a hx of bipolar, cocaine abuse, ETOH and THC. Patient reports he has been living with his mother off and on and homeless. He expressed increased depression with lack of unemployment but is currently wanting to seek work though he reports a current disability case pending. Patient denies having access to transportation, and reports having limited social support.   Patient reports his current goal is to seek long term [facility] an [facility] for substance use and employment. He also requested a list of homeless shelters. Patient has Medicaid in [address] and lives in [place]. He is connected with [facility] for subutex  program with telehealth. Patient denies any prior history of outpatient or inpatient substance abuse treatment. Patient reports having services for behavioral health at [facility] with a follow up appointment on June 10.  Patient currently denies any SI/HI/AVH. Patient aware that LCSW will provide list of [facility] in both [place] and [place]. Patient expressed understanding and appreciation of LCSW assistance. No other needs were reported at this time by patient.

## 2024-01-24 NOTE — Group Note (Signed)
 Group Topic: Recovery Basics  Group Date: 01/24/2024 Start Time: 1200 End Time: 1230 Facilitators: Chipper Council, NT  Department: [facility]  Number of Participants: 8  Group Focus: check in and daily focus Treatment Modality:  Psychoeducation Interventions utilized were patient education and support Purpose: enhance coping skills, express feelings, and increase insight  Name: Cole [name] Date of Birth: 05-26-1981  MR: [id number]    Level of Participation: active Quality of Participation: attentive Interactions with others: gave feedback Mood/Affect: appropriate Triggers (if applicable): n/a Cognition: coherent/clear Progress: Gaining insight Response: n/a Plan: follow-up needed  Patients Problems:  Patient Active Problem List   Diagnosis Date Noted   Benzodiazepine dependence (HCC) 01/24/2024   Drug-seeking behavior 01/24/2024   MDD (major depressive disorder), recurrent episode, severe (HCC) 01/23/2024   Cocaine use disorder, severe, dependence (HCC) 10/06/2023   Cannabis use disorder, severe, dependence (HCC) 10/06/2023   Tardive dyskinesia 02/24/2023   Schizoaffective disorder, bipolar type (HCC) 02/20/2023   Homelessness 12/13/2022   Polysubstance abuse (HCC) 12/13/2022   Substance induced mood disorder (HCC) 12/13/2022   Suicidal ideation 12/13/2022   Severe recurrent major depression (HCC) 07/16/2017   Major depressive disorder, recurrent episode, moderate (HCC) 08/13/2015   Opioid use disorder, severe, dependence (HCC) per history 08/13/2015   Benzodiazepine abuse (HCC) 08/13/2015   Tobacco use disorder 08/13/2015   Hepatitis C antibody test positive 01/22/2015   PTSD (post-traumatic stress disorder) 01/20/2015   Stimulant use disorder (HCC) 01/20/2015   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

## 2024-01-24 NOTE — Group Note (Signed)
 Group Topic: Emotional Regulation  Group Date: 01/24/2024 Start Time: 2030 End Time: 2100 Facilitators: Alvino Joseph, NT  Department: [facility]  Number of Participants: 8  Group Focus: clarity of thought Treatment Modality:  Individual Therapy Interventions utilized were assignment Purpose: trigger / craving management  Name: Cole [name] Date of Birth: August 05, 1981  MR: [id number]    Level of Participation: n/a Quality of Participation: n/a Interactions with others: n/a Mood/Affect: n/a Triggers (if applicable): n/a Cognition: n/a Progress: Other Response: n/a Plan: follow-up needed  Patients Problems:  Patient Active Problem List   Diagnosis Date Noted   Benzodiazepine dependence (HCC) 01/24/2024   Drug-seeking behavior 01/24/2024   MDD (major depressive disorder), recurrent episode, severe (HCC) 01/23/2024   Cocaine use disorder, severe, dependence (HCC) 10/06/2023   Cannabis use disorder, severe, dependence (HCC) 10/06/2023   Tardive dyskinesia 02/24/2023   Schizoaffective disorder, bipolar type (HCC) 02/20/2023   Homelessness 12/13/2022   Polysubstance abuse (HCC) 12/13/2022   Substance induced mood disorder (HCC) 12/13/2022   Suicidal ideation 12/13/2022   Severe recurrent major depression (HCC) 07/16/2017   Major depressive disorder, recurrent episode, moderate (HCC) 08/13/2015   Opioid use disorder, severe, dependence (HCC) per history 08/13/2015   Benzodiazepine abuse (HCC) 08/13/2015   Tobacco use disorder 08/13/2015   Hepatitis C antibody test positive 01/22/2015   PTSD (post-traumatic stress disorder) 01/20/2015   Stimulant use disorder (HCC) 01/20/2015   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

## 2024-01-24 NOTE — Group Note (Signed)
 Group Topic: Communication  Group Date: 01/24/2024 Start Time: 1715 End Time: 1755 Facilitators: Pennie Box, RN  Department: [facility]  Number of Participants: 8  Group Focus: communication Treatment Modality:  Dialectical Behavioral Therapy Interventions utilized were patient education Purpose: express feelings  Name: Cole [name] Date of Birth: 1981/06/03  MR: [id number]    Level of Participation: minimal Quality of Participation: pt left group early without providing any feedback Interactions with others: no interactions with others Mood/Affect: flat Triggers (if applicable): none Cognition: not focused Progress: Minimal Response: pt left group early without providing any feedback Plan: patient will be encouraged to attend and participate in future nursing groups  Patients Problems:  Patient Active Problem List   Diagnosis Date Noted   Benzodiazepine dependence (HCC) 01/24/2024   Drug-seeking behavior 01/24/2024   MDD (major depressive disorder), recurrent episode, severe (HCC) 01/23/2024   Cocaine use disorder, severe, dependence (HCC) 10/06/2023   Cannabis use disorder, severe, dependence (HCC) 10/06/2023   Tardive dyskinesia 02/24/2023   Schizoaffective disorder, bipolar type (HCC) 02/20/2023   Homelessness 12/13/2022   Polysubstance abuse (HCC) 12/13/2022   Substance induced mood disorder (HCC) 12/13/2022   Suicidal ideation 12/13/2022   Severe recurrent major depression (HCC) 07/16/2017   Major depressive disorder, recurrent episode, moderate (HCC) 08/13/2015   Opioid use disorder, severe, dependence (HCC) per history 08/13/2015   Benzodiazepine abuse (HCC) 08/13/2015   Tobacco use disorder 08/13/2015   Hepatitis C antibody test positive 01/22/2015   PTSD (post-traumatic stress disorder) 01/20/2015   Stimulant use disorder (HCC) 01/20/2015   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

## 2024-01-24 NOTE — ED Notes (Signed)
 Pt is in the dayroom watching TV with peers. Pt denies SI/HI/AVH. Pt endorsed back pain and stated waiting to get his pain med at bedtime.Pt has no further complain.No acute distress noted. Will continue to monitor for safety and provide support.

## 2024-01-24 NOTE — ED Notes (Signed)
 Pt was provided dinner.

## 2024-01-24 NOTE — Group Note (Signed)
 Group Topic: Relapse and Recovery  Group Date: 01/24/2024 Start Time: 1130 End Time: 1150 Facilitators: Arlan Belling, RN  Department: [facility]  Number of Participants: 8  Group Focus: chemical dependency issues Treatment Modality:  Behavior Modification Therapy Interventions utilized were clarification and patient education Purpose: express feelings, express irrational fears, improve communication skills, increase insight, regain self-worth, and reinforce self-care  Name: Cole [name] Date of Birth: 01/10/1981  MR: [id number]    Level of Participation: minimal Quality of Participation: attentive Interactions with others: gave feedback Mood/Affect: appropriate Triggers (if applicable):   Cognition: coherent/clear Progress: Gaining insight Response:   Plan: follow-up needed  Patients Problems:  Patient Active Problem List   Diagnosis Date Noted   Benzodiazepine dependence (HCC) 01/24/2024   Drug-seeking behavior 01/24/2024   MDD (major depressive disorder), recurrent episode, severe (HCC) 01/23/2024   Cocaine use disorder, severe, dependence (HCC) 10/06/2023   Cannabis use disorder, severe, dependence (HCC) 10/06/2023   Tardive dyskinesia 02/24/2023   Schizoaffective disorder, bipolar type (HCC) 02/20/2023   Homelessness 12/13/2022   Polysubstance abuse (HCC) 12/13/2022   Substance induced mood disorder (HCC) 12/13/2022   Suicidal ideation 12/13/2022   Severe recurrent major depression (HCC) 07/16/2017   Major depressive disorder, recurrent episode, moderate (HCC) 08/13/2015   Opioid use disorder, severe, dependence (HCC) per history 08/13/2015   Benzodiazepine abuse (HCC) 08/13/2015   Tobacco use disorder 08/13/2015   Hepatitis C antibody test positive 01/22/2015   PTSD (post-traumatic stress disorder) 01/20/2015   Stimulant use disorder (HCC) 01/20/2015   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

## 2024-01-24 NOTE — ED Provider Notes (Signed)
 Facility Based Crisis Admission H&P  Date: 01/24/24 Patient Name: Cole [name] MRN: [id number] Chief Complaint: "homeless, stressed out, depressed"  Diagnoses:  Final diagnoses:  Polysubstance dependence (HCC)    HPI: 43 YO M with PTSD, chronic pain, depression, schizoaffective, anxiety, polysubstance dependence (cannabis, cocaine, opioids and benzos) who has been regularly prescribed subutex , xanax  and gabapentin  who presents with depressed mood and wanting detox. On interview, he does not want his medication chanced. I inform the patient that xanax  should not go with subutex , and that there was concern for overdose, as well as his multiple prescribers. The patient expresses understanding and is willing to continue on subutex  and slightly reduced gabapentin . He was noted this morning to be slowed and slurred of speech, but was better around lunch. He has been depressed and anxious at home. He has gone days without sleeping or eating, and estimates 5-10 pounds weight loss. He does not think he is having paranoia or delusions. He denies thoughts of harm to self or others. He is willing to consider residential placement.   PHQ 2-9:  Flowsheet Row ED from 10/06/2023 in [facility] ED from 10/02/2023 in [facility]  Thoughts that you would be better off dead, or of hurting yourself in some way More than half the days More than half the days  PHQ-9 Total Score 16 18       Flowsheet Row ED from 01/23/2024 in [facility] ED from 01/22/2024 in [facility] Emergency Department at [facility] ED from 10/10/2023 in [facility] Emergency Department at [facility]  C-SSRS RISK CATEGORY High Risk High Risk No Risk       Screenings    Flowsheet Row Most Recent Value  CIWA-Ar Total 0       Total Time spent with patient: 45 minutes  Musculoskeletal  Strength & Muscle Tone: within normal  limits Gait & Station: normal Patient leans: N/A  Psychiatric Specialty Exam  Presentation General Appearance:  Casual  Eye Contact: Good  Speech: Slow; Slurred  Speech Volume: Normal  Handedness: Right   Mood and Affect  Mood: Depressed  Affect: Flat   Thought Process  Thought Processes: Linear  Descriptions of Associations:Intact  Orientation:Full (Time, Place and Person)  Thought Content:Logical  Diagnosis of Schizophrenia or Schizoaffective disorder in past: Yes   Hallucinations:Hallucinations: None  Ideas of Reference:None  Suicidal Thoughts:Suicidal Thoughts: No  Homicidal Thoughts:Homicidal Thoughts: No   Sensorium  Memory: Immediate Fair; Recent Fair; Remote Poor  Judgment: Poor  Insight: Poor   Executive Functions  Concentration: Fair  Attention Span: Fair  Recall: Fair  Fund of Knowledge: Fair  Language: Fair   Psychomotor Activity  Psychomotor Activity:Psychomotor Activity: Decreased   Assets  Assets: Desire for Improvement   Sleep  Sleep:Sleep: Poor   Nutritional Assessment (For OBS and FBC admissions only) Has the patient had a weight loss or gain of 10 pounds or more in the last 3 months?: No Has the patient had a decrease in food intake/or appetite?: Yes Does the patient have dental problems?: No Does the patient have eating habits or behaviors that may be indicators of an eating disorder including binging or inducing vomiting?: No Has the patient recently lost weight without trying?: 1 Has the patient been eating poorly because of a decreased appetite?: 1 Malnutrition Screening Tool Score: 2    Physical Exam Vitals and nursing note reviewed.  Constitutional:      Appearance:  Normal appearance.  HENT:     Head: Normocephalic and atraumatic.  Eyes:     Extraocular Movements: Extraocular movements intact.  Pulmonary:     Effort: Pulmonary effort is normal.  Musculoskeletal:        General:  Normal range of motion.     Cervical back: Normal range of motion.  Neurological:     General: No focal deficit present.     Mental Status: He is alert and oriented to person, place, and time.  Psychiatric:        Behavior: Behavior normal.    Review of Systems  Constitutional:  Negative for chills and fever.  Respiratory:  Negative for cough and shortness of breath.   Cardiovascular:  Negative for chest pain.  Gastrointestinal:  Negative for constipation, diarrhea, nausea and vomiting.       Hemorrhoids  Genitourinary:  Negative for dysuria.  Musculoskeletal:  Negative for back pain and myalgias.  Skin:  Negative for rash.  Neurological:  Negative for tremors.  Psychiatric/Behavioral:  Negative for hallucinations and suicidal ideas.     Blood pressure 118/72, pulse 94, temperature 98.5 F (36.9 C), temperature source Oral, resp. rate 18, SpO2 99%. There is no height or weight on file to calculate BMI.  Past Psychiatric History: previously treated with gabapentin , alprazolam  and subutex .  EMR also has trazodone , depakote , risperdal  and vraylar .  Last Louann Rous was taken to the ER from [facility] due to verbal aggression towards staff.    Is the patient at risk to self? No  Has the patient been a risk to self in the past 6 months? No .    Has the patient been a risk to self within the distant past? Yes   Is the patient a risk to others? No   Has the patient been a risk to others in the past 6 months? Yes   Has the patient been a risk to others within the distant past? Yes   Past Medical History: back pain, TD Family History: several with alcoholism, anxiety, depression. One cousin with completed suicide Social History: currently homeless in [place]. Applied for disability and not currently working. Single. 1 adult child.  Substance: 1/2 ppd cigarettes. Rare alcohol. Regular use of cannabis, cocaine, methamphetamines. Currently on subutex  for opioid dependence (along with gabapentin   and xanax )  Last Labs:  Admission on 01/22/2024, Discharged on 01/23/2024  Component Date Value Ref Range Status   Sodium 01/22/2024 141  135 - 145 mmol/L Final   Potassium 01/22/2024 3.5  3.5 - 5.1 mmol/L Final   Chloride 01/22/2024 107  98 - 111 mmol/L Final   CO2 01/22/2024 24  22 - 32 mmol/L Final   Glucose, Bld 01/22/2024 89  70 - 99 mg/dL Final   Glucose reference range applies only to samples taken after fasting for at least 8 hours.   BUN 01/22/2024 7  6 - 20 mg/dL Final   Creatinine, Ser 01/22/2024 0.98  0.61 - 1.24 mg/dL Final   Calcium 40/98/1191 8.7 (L)  8.9 - 10.3 mg/dL Final   Total Protein 47/82/9562 6.7  6.5 - 8.1 g/dL Final   Albumin 13/03/6577 3.7  3.5 - 5.0 g/dL Final   AST 46/96/2952 12 (L)  15 - 41 U/L Final   ALT 01/22/2024 14  0 - 44 U/L Final   Alkaline Phosphatase 01/22/2024 39  38 - 126 U/L Final   Total Bilirubin 01/22/2024 0.5  0.0 - 1.2 mg/dL Final   GFR, Estimated 01/22/2024 >60  >  60 mL/min Final   Comment: (NOTE) Calculated using the CKD-EPI Creatinine Equation (2021)    Anion gap 01/22/2024 10  5 - 15 Final   Performed at [facility], [address] [place], [place] [address]   Alcohol, Ethyl (B) 01/22/2024 17 (H)  <15 mg/dL Final   Comment: (NOTE) For medical purposes only. Performed at [facility], [address] [place], [place] [address]    WBC 01/22/2024 5.0  4.0 - 10.5 K/uL Final   RBC 01/22/2024 4.97  4.22 - 5.81 MIL/uL Final   Hemoglobin 01/22/2024 12.8 (L)  13.0 - 17.0 g/dL Final   HCT 09/81/1914 38.6 (L)  39.0 - 52.0 % Final   MCV 01/22/2024 77.7 (L)  80.0 - 100.0 fL Final   MCH 01/22/2024 25.8 (L)  26.0 - 34.0 pg Final   MCHC 01/22/2024 33.2  30.0 - 36.0 g/dL Final   RDW 78/29/5621 13.6  11.5 - 15.5 % Final   Platelets 01/22/2024 147 (L)  150 - 400 K/uL Final   nRBC 01/22/2024 0.0  0.0 - 0.2 % Final   Performed at [facility], [address] [place], [place] [address]   Opiates 01/22/2024 NONE DETECTED  NONE DETECTED Final    Cocaine 01/22/2024 POSITIVE (A)  NONE DETECTED Final   Benzodiazepines 01/22/2024 NONE DETECTED  NONE DETECTED Final   Amphetamines 01/22/2024 NONE DETECTED  NONE DETECTED Final   Tetrahydrocannabinol 01/22/2024 POSITIVE (A)  NONE DETECTED Final   Barbiturates 01/22/2024 NONE DETECTED  NONE DETECTED Final   Comment: (NOTE) DRUG SCREEN FOR MEDICAL PURPOSES ONLY.  IF CONFIRMATION IS NEEDED FOR ANY PURPOSE, NOTIFY LAB WITHIN 5 DAYS.  LOWEST DETECTABLE LIMITS FOR URINE DRUG SCREEN Drug Class                     Cutoff (ng/mL) Amphetamine and metabolites    1000 Barbiturate and metabolites    200 Benzodiazepine                 200 Opiates and metabolites        300 Cocaine and metabolites        300 THC                            50 Performed at [facility], [address], [place], [place] [address]   Admission on 10/10/2023, Discharged on 10/10/2023  Component Date Value Ref Range Status   Sodium 10/10/2023 141  135 - 145 mmol/L Final   Potassium 10/10/2023 4.4  3.5 - 5.1 mmol/L Final   Chloride 10/10/2023 106  98 - 111 mmol/L Final   CO2 10/10/2023 27  22 - 32 mmol/L Final   Glucose, Bld 10/10/2023 106 (H)  70 - 99 mg/dL Final   Glucose reference range applies only to samples taken after fasting for at least 8 hours.   BUN 10/10/2023 12  6 - 20 mg/dL Final   Creatinine, Ser 10/10/2023 0.85  0.61 - 1.24 mg/dL Final   Calcium 62/95/2841 9.4  8.9 - 10.3 mg/dL Final   GFR, Estimated 10/10/2023 >60  >60 mL/min Final   Comment: (NOTE) Calculated using the CKD-EPI Creatinine Equation (2021)    Anion gap 10/10/2023 8  5 - 15 Final   Performed at [facility], [address], [place], [place] [address]   WBC 10/10/2023 7.3  4.0 - 10.5 K/uL Final   RBC 10/10/2023 5.30  4.22 -  5.81 MIL/uL Final   Hemoglobin 10/10/2023 13.4  13.0 - 17.0 g/dL Final   HCT 16/05/9603 41.8  39.0 - 52.0 % Final   MCV 10/10/2023 78.9 (L)  80.0 - 100.0 fL Final   MCH 10/10/2023 25.3 (L)  26.0 - 34.0  pg Final   MCHC 10/10/2023 32.1  30.0 - 36.0 g/dL Final   RDW 54/04/8118 13.4  11.5 - 15.5 % Final   Platelets 10/10/2023 167  150 - 400 K/uL Final   nRBC 10/10/2023 0.0  0.0 - 0.2 % Final   Performed at [facility], [address], [place], [place] [address]   Troponin I (High Sensitivity) 10/10/2023 <2  <18 ng/L Final   Comment: (NOTE) Elevated high sensitivity troponin I (hsTnI) values and significant  changes across serial measurements may suggest ACS but many other  chronic and acute conditions are known to elevate hsTnI results.  Refer to the "Links" section for chest pain algorithms and additional  guidance. Performed at [facility], [address], [place], [place] [address]    Troponin I (High Sensitivity) 10/10/2023 2  <18 ng/L Final   Comment: (NOTE) Elevated high sensitivity troponin I (hsTnI) values and significant  changes across serial measurements may suggest ACS but many other  chronic and acute conditions are known to elevate hsTnI results.  Refer to the "Links" section for chest pain algorithms and additional  guidance. Performed at [facility], [address] [place], [place] [address]   Admission on 10/06/2023, Discharged on 10/06/2023  Component Date Value Ref Range Status   Sodium 10/06/2023 138  135 - 145 mmol/L Final   Potassium 10/06/2023 4.4  3.5 - 5.1 mmol/L Final   Chloride 10/06/2023 100  98 - 111 mmol/L Final   CO2 10/06/2023 27  22 - 32 mmol/L Final   Glucose, Bld 10/06/2023 104 (H)  70 - 99 mg/dL Final   Glucose reference range applies only to samples taken after fasting for at least 8 hours.   BUN 10/06/2023 26 (H)  6 - 20 mg/dL Final   Creatinine, Ser 10/06/2023 1.14  0.61 - 1.24 mg/dL Final   Calcium 78/46/9629 9.4  8.9 - 10.3 mg/dL Final   Total Protein 52/84/1324 8.2 (H)  6.5 - 8.1 g/dL Final   Albumin 40/05/2724 4.2  3.5 - 5.0 g/dL Final   AST 36/64/4034 20  15 - 41 U/L Final   ALT 10/06/2023 21  0 - 44 U/L Final    Alkaline Phosphatase 10/06/2023 50  38 - 126 U/L Final   Total Bilirubin 10/06/2023 0.9  0.0 - 1.2 mg/dL Final   GFR, Estimated 10/06/2023 >60  >60 mL/min Final   Comment: (NOTE) Calculated using the CKD-EPI Creatinine Equation (2021)    Anion gap 10/06/2023 11  5 - 15 Final   Performed at [facility], [address], [place], [place] [address]   Alcohol, Ethyl (B) 10/06/2023 <10  <10 mg/dL Final   Comment: (NOTE) Lowest detectable limit for serum alcohol is 10 mg/dL.  For medical purposes only. Performed at [facility], [address] [place], [place] [address]    Opiates 10/06/2023 NONE DETECTED  NONE DETECTED Final   Cocaine 10/06/2023 POSITIVE (A)  NONE DETECTED Final   Benzodiazepines 10/06/2023 POSITIVE (A)  NONE DETECTED Final   Amphetamines 10/06/2023 POSITIVE (A)  NONE DETECTED Final   Tetrahydrocannabinol 10/06/2023 POSITIVE (A)  NONE DETECTED Final   Barbiturates 10/06/2023 NONE DETECTED  NONE DETECTED Final   Comment: (NOTE) DRUG SCREEN FOR MEDICAL PURPOSES ONLY.  IF CONFIRMATION IS NEEDED FOR ANY PURPOSE, NOTIFY LAB WITHIN 5 DAYS.  LOWEST DETECTABLE LIMITS FOR URINE DRUG SCREEN Drug Class                     Cutoff (ng/mL) Amphetamine and metabolites    1000 Barbiturate and metabolites    200 Benzodiazepine                 200 Opiates and metabolites        300 Cocaine and metabolites        300 THC                            50 Performed at [facility], [address], [place], [place] [address]    WBC 10/06/2023 5.4  4.0 - 10.5 K/uL Final   RBC 10/06/2023 6.07 (H)  4.22 - 5.81 MIL/uL Final   Hemoglobin 10/06/2023 15.3  13.0 - 17.0 g/dL Final   HCT 60/45/4098 47.7  39.0 - 52.0 % Final   MCV 10/06/2023 78.6 (L)  80.0 - 100.0 fL Final   MCH 10/06/2023 25.2 (L)  26.0 - 34.0 pg Final   MCHC 10/06/2023 32.1  30.0 - 36.0 g/dL Final   RDW 11/91/4782 13.5  11.5 - 15.5 % Final   Platelets 10/06/2023 202  150 - 400 K/uL Final   nRBC  10/06/2023 0.0  0.0 - 0.2 % Final   Neutrophils Relative % 10/06/2023 51  % Final   Neutro Abs 10/06/2023 2.8  1.7 - 7.7 K/uL Final   Lymphocytes Relative 10/06/2023 33  % Final   Lymphs Abs 10/06/2023 1.8  0.7 - 4.0 K/uL Final   Monocytes Relative 10/06/2023 13  % Final   Monocytes Absolute 10/06/2023 0.7  0.1 - 1.0 K/uL Final   Eosinophils Relative 10/06/2023 2  % Final   Eosinophils Absolute 10/06/2023 0.1  0.0 - 0.5 K/uL Final   Basophils Relative 10/06/2023 1  % Final   Basophils Absolute 10/06/2023 0.0  0.0 - 0.1 K/uL Final   Immature Granulocytes 10/06/2023 0  % Final   Abs Immature Granulocytes 10/06/2023 0.01  0.00 - 0.07 K/uL Final   Performed at [facility], [address], [place], [place] [address]   Valproic Acid  Lvl 10/06/2023 73  50.0 - 100.0 ug/mL Final   Performed at [facility], [address] [place], [place] [address]  Admission on 10/02/2023, Discharged on 10/02/2023  Component Date Value Ref Range Status   Sodium 10/02/2023 138  135 - 145 mmol/L Final   Potassium 10/02/2023 3.7  3.5 - 5.1 mmol/L Final   Chloride 10/02/2023 103  98 - 111 mmol/L Final   CO2 10/02/2023 28  22 - 32 mmol/L Final   Glucose, Bld 10/02/2023 82  70 - 99 mg/dL Final   Glucose reference range applies only to samples taken after fasting for at least 8 hours.   BUN 10/02/2023 24 (H)  6 - 20 mg/dL Final   Creatinine, Ser 10/02/2023 0.82  0.61 - 1.24 mg/dL Final   Calcium 78/46/9629 8.7 (L)  8.9 - 10.3 mg/dL Final   Total Protein 52/84/1324 7.0  6.5 - 8.1 g/dL Final   Albumin 40/05/2724 3.7  3.5 - 5.0 g/dL Final   AST 36/64/4034 34  15 - 41 U/L Final   ALT  10/02/2023 24  0 - 44 U/L Final   Alkaline Phosphatase 10/02/2023 46  38 - 126 U/L Final   Total Bilirubin 10/02/2023 0.8  0.0 - 1.2 mg/dL Final   GFR, Estimated 10/02/2023 >60  >60 mL/min Final   Comment: (NOTE) Calculated using the CKD-EPI Creatinine Equation (2021)    Anion gap 10/02/2023 7  5 - 15 Final   Performed  at [facility], [address], [place], [place] [address]   Alcohol, Ethyl (B) 10/02/2023 <10  <10 mg/dL Final   Comment: (NOTE) Lowest detectable limit for serum alcohol is 10 mg/dL.  For medical purposes only. Performed at [facility], [address] [place], [place] [address]    Salicylate Lvl 10/02/2023 <7.0 (L)  7.0 - 30.0 mg/dL Final   Performed at [facility], [address] [place], [place] [address]   Acetaminophen  (Tylenol ), Serum 10/02/2023 <10 (L)  10 - 30 ug/mL Final   Comment: (NOTE) Therapeutic concentrations vary significantly. A range of 10-30 ug/mL  may be an effective concentration for many patients. However, some  are best treated at concentrations outside of this range. Acetaminophen  concentrations >150 ug/mL at 4 hours after ingestion  and >50 ug/mL at 12 hours after ingestion are often associated with  toxic reactions.  Performed at [facility], [address] [place], [place] [address]    WBC 10/02/2023 4.8  4.0 - 10.5 K/uL Final   RBC 10/02/2023 4.92  4.22 - 5.81 MIL/uL Final   Hemoglobin 10/02/2023 12.8 (L)  13.0 - 17.0 g/dL Final   HCT 06/26/2535 39.2  39.0 - 52.0 % Final   MCV 10/02/2023 79.7 (L)  80.0 - 100.0 fL Final   MCH 10/02/2023 26.0  26.0 - 34.0 pg Final   MCHC 10/02/2023 32.7  30.0 - 36.0 g/dL Final   RDW 64/40/3474 13.5  11.5 - 15.5 % Final   Platelets 10/02/2023 162  150 - 400 K/uL Final   nRBC 10/02/2023 0.0  0.0 - 0.2 % Final   Performed at [facility], [address] [place], [place] [address]   Opiates 10/02/2023 NONE DETECTED  NONE DETECTED Final   Cocaine 10/02/2023 POSITIVE (A)  NONE DETECTED Final   Benzodiazepines 10/02/2023 NONE DETECTED  NONE DETECTED Final   Amphetamines 10/02/2023 NONE DETECTED  NONE DETECTED Final   Tetrahydrocannabinol 10/02/2023 POSITIVE (A)  NONE DETECTED Final   Barbiturates 10/02/2023 NONE DETECTED  NONE DETECTED Final   Comment: (NOTE) DRUG SCREEN FOR MEDICAL PURPOSES ONLY.  IF CONFIRMATION IS  NEEDED FOR ANY PURPOSE, NOTIFY LAB WITHIN 5 DAYS.  LOWEST DETECTABLE LIMITS FOR URINE DRUG SCREEN Drug Class                     Cutoff (ng/mL) Amphetamine and metabolites    1000 Barbiturate and metabolites    200 Benzodiazepine                 200 Opiates and metabolites        300 Cocaine and metabolites        300 THC                            50 Performed at [facility], [address], [place], [place] [address]   Admission on 08/07/2023, Discharged on 08/07/2023  Component Date Value Ref Range Status   Sodium 08/07/2023 137  135 - 145 mmol/L Final   Potassium 08/07/2023 3.7  3.5 -  5.1 mmol/L Final   Chloride 08/07/2023 99  98 - 111 mmol/L Final   CO2 08/07/2023 29  22 - 32 mmol/L Final   Glucose, Bld 08/07/2023 69 (L)  70 - 99 mg/dL Final   Glucose reference range applies only to samples taken after fasting for at least 8 hours.   BUN 08/07/2023 17  6 - 20 mg/dL Final   Creatinine, Ser 08/07/2023 0.88  0.61 - 1.24 mg/dL Final   Calcium 78/46/9629 9.4  8.9 - 10.3 mg/dL Final   Total Protein 52/84/1324 8.1  6.5 - 8.1 g/dL Final   Albumin 40/05/2724 4.5  3.5 - 5.0 g/dL Final   AST 36/64/4034 21  15 - 41 U/L Final   ALT 08/07/2023 27  0 - 44 U/L Final   Alkaline Phosphatase 08/07/2023 47  38 - 126 U/L Final   Total Bilirubin 08/07/2023 0.8  <1.2 mg/dL Final   GFR, Estimated 08/07/2023 >60  >60 mL/min Final   Comment: (NOTE) Calculated using the CKD-EPI Creatinine Equation (2021)    Anion gap 08/07/2023 9  5 - 15 Final   Performed at [facility], [address] [place], [place] [address]   Alcohol, Ethyl (B) 08/07/2023 <10  <10 mg/dL Final   Comment: (NOTE) Lowest detectable limit for serum alcohol is 10 mg/dL.  For medical purposes only. Performed at [facility], [address] [place], [place] [address]    WBC 08/07/2023 9.0  4.0 - 10.5 K/uL Final   RBC 08/07/2023 5.90 (H)  4.22 - 5.81 MIL/uL Final   Hemoglobin 08/07/2023 15.0  13.0 - 17.0 g/dL Final   HCT  56/43/3295 47.6  39.0 - 52.0 % Final   MCV 08/07/2023 80.7  80.0 - 100.0 fL Final   MCH 08/07/2023 25.4 (L)  26.0 - 34.0 pg Final   MCHC 08/07/2023 31.5  30.0 - 36.0 g/dL Final   RDW 18/84/1660 13.4  11.5 - 15.5 % Final   Platelets 08/07/2023 167  150 - 400 K/uL Final   nRBC 08/07/2023 0.0  0.0 - 0.2 % Final   Performed at [facility], [address] [place], [place] [address]  Admission on 07/29/2023, Discharged on 08/02/2023  Component Date Value Ref Range Status   Ferritin 07/30/2023 26  24 - 336 ng/mL Final   Performed at [facility], [address] [place], [place] [address]   Hgb A1c MFr Bld 07/30/2023 5.5  4.8 - 5.6 % Final   Comment: (NOTE) Pre diabetes:          5.7%-6.4%  Diabetes:              >6.4%  Glycemic control for   <7.0% adults with diabetes    Mean Plasma Glucose 07/30/2023 111.15  mg/dL Final   Performed at [facility], [address], [place], [place] [address]   TSH 07/30/2023 3.653  0.350 - 4.500 uIU/mL Final   Comment: Performed by a 3rd Generation assay with a functional sensitivity of <=0.01 uIU/mL. Performed at [facility], [address], [place], [place] [address]    Cholesterol 07/30/2023 147  0 - 200 mg/dL Final   Triglycerides 54/27/0623 83  <150 mg/dL Final   HDL 76/28/3151 56  >40 mg/dL Final   Total CHOL/HDL Ratio 07/30/2023 2.6  RATIO Final   VLDL 07/30/2023 17  0 - 40 mg/dL Final   LDL Cholesterol 07/30/2023 74  0 - 99 mg/dL Final   Comment:  Total Cholesterol/HDL:CHD Risk Coronary Heart Disease Risk Table                     Men   Women  1/2 Average Risk   3.4   3.3  Average Risk       5.0   4.4  2 X Average Risk   9.6   7.1  3 X Average Risk  23.4   11.0        Use the calculated Patient Ratio above and the CHD Risk Table to determine the patient's CHD Risk.        ATP III CLASSIFICATION (LDL):  <100     mg/dL   Optimal  981-191  mg/dL   Near or Above                     Optimal  130-159  mg/dL   Borderline  478-295  mg/dL   High  >621     mg/dL   Very High Performed at [facility], [address], [place], [place] [address]    Hepatitis B Surface Ag 07/30/2023 NON REACTIVE  NON REACTIVE Final   HCV Ab 07/30/2023 Reactive (A)  NON REACTIVE Final   Comment: (NOTE) The CDC recommends that a Reactive HCV antibody result be followed up  with a HCV Nucleic Acid Amplification test.     Hep A IgM 07/30/2023 NON REACTIVE  NON REACTIVE Final   Hep B C IgM 07/30/2023 NON REACTIVE  NON REACTIVE Final   Performed at [facility], [address], [place], [place] [address]   Valproic Acid  Lvl 07/30/2023 58  50.0 - 100.0 ug/mL Final   Performed at [facility], [address] [place], [place] [address]   Iron 07/30/2023 48  45 - 182 ug/dL Final   TIBC 84/13/2440 421  250 - 450 ug/dL Final   Saturation Ratios 07/30/2023 11 (L)  17.9 - 39.5 % Final   UIBC 07/30/2023 373  ug/dL Final   Performed at [facility], [address], [place], [place] [address]  Admission on 07/28/2023, Discharged on 07/29/2023  Component Date Value Ref Range Status   Sodium 07/28/2023 138  135 - 145 mmol/L Final   Potassium 07/28/2023 3.5  3.5 - 5.1 mmol/L Final   Chloride 07/28/2023 107  98 - 111 mmol/L Final   CO2 07/28/2023 26  22 - 32 mmol/L Final   Glucose, Bld 07/28/2023 91  70 - 99 mg/dL Final   Glucose reference range applies only to samples taken after fasting for at least 8 hours.   BUN 07/28/2023 14  6 - 20 mg/dL Final   Creatinine, Ser 07/28/2023 0.74  0.61 - 1.24 mg/dL Final   Calcium 53/66/4403 8.7 (L)  8.9 - 10.3 mg/dL Final   Total Protein 47/42/5956 7.0  6.5 - 8.1 g/dL Final   Albumin 38/75/6433 3.8  3.5 - 5.0 g/dL Final   AST 29/51/8841 14 (L)  15 - 41 U/L Final   ALT 07/28/2023 16  0 - 44 U/L Final   Alkaline Phosphatase 07/28/2023 38  38 - 126 U/L Final   Total Bilirubin 07/28/2023 1.1  <1.2 mg/dL  Final   GFR, Estimated 07/28/2023 >60  >60 mL/min Final   Comment: (NOTE) Calculated using the CKD-EPI Creatinine Equation (2021)    Anion gap 07/28/2023 5  5 - 15 Final   Performed at [facility], [address]  [place], [place] [address]   Alcohol, Ethyl (B) 07/28/2023 <10  <10 mg/dL Final   Comment: (NOTE) Lowest detectable limit for serum alcohol is 10 mg/dL.  For medical purposes only. Performed at [facility], [address], [place], [place] [address]    Opiates 07/28/2023 NONE DETECTED  NONE DETECTED Final   Cocaine 07/28/2023 POSITIVE (A)  NONE DETECTED Final   Benzodiazepines 07/28/2023 POSITIVE (A)  NONE DETECTED Final   Amphetamines 07/28/2023 NONE DETECTED  NONE DETECTED Final   Tetrahydrocannabinol 07/28/2023 POSITIVE (A)  NONE DETECTED Final   Barbiturates 07/28/2023 NONE DETECTED  NONE DETECTED Final   Comment: (NOTE) DRUG SCREEN FOR MEDICAL PURPOSES ONLY.  IF CONFIRMATION IS NEEDED FOR ANY PURPOSE, NOTIFY LAB WITHIN 5 DAYS.  LOWEST DETECTABLE LIMITS FOR URINE DRUG SCREEN Drug Class                     Cutoff (ng/mL) Amphetamine and metabolites    1000 Barbiturate and metabolites    200 Benzodiazepine                 200 Opiates and metabolites        300 Cocaine and metabolites        300 THC                            50 Performed at [facility], [address], [place], [place] [address]    WBC 07/28/2023 4.9  4.0 - 10.5 K/uL Final   RBC 07/28/2023 5.03  4.22 - 5.81 MIL/uL Final   Hemoglobin 07/28/2023 12.8 (L)  13.0 - 17.0 g/dL Final   HCT 63/87/5643 39.7  39.0 - 52.0 % Final   MCV 07/28/2023 78.9 (L)  80.0 - 100.0 fL Final   MCH 07/28/2023 25.4 (L)  26.0 - 34.0 pg Final   MCHC 07/28/2023 32.2  30.0 - 36.0 g/dL Final   RDW 32/95/1884 13.4  11.5 - 15.5 % Final   Platelets 07/28/2023 148 (L)  150 - 400 K/uL Final   nRBC 07/28/2023 0.0  0.0 - 0.2 % Final   Neutrophils Relative % 07/28/2023 52  % Final   Neutro Abs 07/28/2023 2.5  1.7 - 7.7 K/uL Final    Lymphocytes Relative 07/28/2023 38  % Final   Lymphs Abs 07/28/2023 1.8  0.7 - 4.0 K/uL Final   Monocytes Relative 07/28/2023 8  % Final   Monocytes Absolute 07/28/2023 0.4  0.1 - 1.0 K/uL Final   Eosinophils Relative 07/28/2023 2  % Final   Eosinophils Absolute 07/28/2023 0.1  0.0 - 0.5 K/uL Final   Basophils Relative 07/28/2023 0  % Final   Basophils Absolute 07/28/2023 0.0  0.0 - 0.1 K/uL Final   Immature Granulocytes 07/28/2023 0  % Final   Abs Immature Granulocytes 07/28/2023 0.01  0.00 - 0.07 K/uL Final   Performed at [facility], [address] [place], [place] [address]   Salicylate Lvl 07/28/2023 <7.0 (L)  7.0 - 30.0 mg/dL Final   Performed at [facility], [address] [place], [place] [address]   Acetaminophen  (Tylenol ), Serum 07/28/2023 <10 (L)  10 - 30 ug/mL Final   Comment: (NOTE) Therapeutic concentrations vary significantly. A range of 10-30 ug/mL  may be an effective concentration for many patients. However, some  are best treated at concentrations outside of this range. Acetaminophen  concentrations >150 ug/mL at 4 hours after ingestion  and >50 ug/mL at 12 hours  after ingestion are often associated with  toxic reactions.  Performed at [facility], [address] [place], [place] [address]     Allergies: Aleve  [naproxen  sodium], Aspirin, Ibuprofen , Seroquel [quetiapine fumerate], Tramadol , and Tylenol  [acetaminophen ]  Medications:  Facility Ordered Medications  Medication   [COMPLETED] oxyCODONE  (Oxy IR/ROXICODONE ) immediate release tablet 10 mg   alum & mag hydroxide-simeth (MAALOX/MYLANTA) 200-200-20 MG/5ML suspension 30 mL   magnesium  hydroxide (MILK OF MAGNESIA) suspension 30 mL   hydrOXYzine  (ATARAX ) tablet 25 mg   traZODone  (DESYREL ) tablet 50 mg   haloperidol  (HALDOL ) tablet 5 mg   And   diphenhydrAMINE  (BENADRYL ) capsule 50 mg   haloperidol  lactate (HALDOL ) injection 5 mg   And   diphenhydrAMINE  (BENADRYL ) injection 50 mg   And   LORazepam   (ATIVAN ) injection 2 mg   haloperidol  lactate (HALDOL ) injection 10 mg   And   diphenhydrAMINE  (BENADRYL ) injection 50 mg   And   LORazepam  (ATIVAN ) injection 2 mg   nicotine  polacrilex (NICORETTE ) gum 2 mg   hydrocortisone  (ANUSOL -HC) 2.5 % rectal cream   gabapentin  (NEURONTIN ) capsule 600 mg   buprenorphine  (SUBUTEX ) sublingual tablet 8 mg   LORazepam  (ATIVAN ) tablet 1 mg   PTA Medications  Medication Sig   ALPRAZolam  (XANAX ) 1 MG tablet Take 1 mg by mouth 3 (three) times daily.   gabapentin  (NEURONTIN ) 800 MG tablet Take 800 mg by mouth 3 (three) times daily.   nicotine  polacrilex (NICORETTE ) 2 MG gum Take 2 mg by mouth every 4 (four) hours while awake.   buprenorphine  (SUBUTEX ) 8 MG SUBL SL tablet Place 8 mg under the tongue 2 (two) times daily.   naloxone  (NARCAN ) nasal spray 4 mg/0.1 mL SMARTSIG:1 Both Nares Daily    Long Term Goals: Improvement in symptoms so as ready for discharge  Short Term Goals: Patient will verbalize feelings in meetings with treatment team members., Patient will attend at least of 50% of the groups daily., Pt will complete the PHQ9 on admission, day 3 and discharge., Patient will participate in completing the [place] Suicide Severity Rating Scale, Patient will score a low risk of violence for 24 hours prior to discharge, and Patient will take medications as prescribed daily.  Medical Decision Making  Patient currently getting several controlled medications from more than one provider, in addition using recreational street drugs regularly. He appeared over medicated this morning. Will continue subutex , reduce gabapentin  and not continue xanax .     Recommendations  Based on my evaluation the patient does not appear to have an emergency medical condition.    Floyce Hutching, MD 01/24/24  3:08 PM

## 2024-01-24 NOTE — ED Notes (Signed)
 Pt currently at nursing station, discussing potential post discharge placement. RN provided pt with informational resources. Pt denies si hi and avh, however, reports having previous plan to hang self. Pt provided a Engineer, manufacturing for safety. Pt c/o hemorrhoids, pt says he was instructed to see a GI specialist post discharge. RN provided education about causes of hemorrhoids. , last BM this past Sunday. Pt denies other complaints.

## 2024-01-24 NOTE — ED Notes (Signed)
 Pt was provided lunch

## 2024-01-25 DIAGNOSIS — F32A Depression, unspecified: Secondary | ICD-10-CM | POA: Diagnosis not present

## 2024-01-25 DIAGNOSIS — G8929 Other chronic pain: Secondary | ICD-10-CM | POA: Diagnosis not present

## 2024-01-25 DIAGNOSIS — F1120 Opioid dependence, uncomplicated: Secondary | ICD-10-CM | POA: Diagnosis not present

## 2024-01-25 DIAGNOSIS — F17210 Nicotine dependence, cigarettes, uncomplicated: Secondary | ICD-10-CM | POA: Diagnosis not present

## 2024-01-25 MED ORDER — POLYETHYLENE GLYCOL 3350 17 G PO PACK
17.0000 g | PACK | Freq: Every day | ORAL | Status: DC
  Administered 2024-01-25 – 2024-01-26 (×2): 17 g via ORAL
  Filled 2024-01-25 (×2): qty 1

## 2024-01-25 MED ORDER — MIRTAZAPINE 7.5 MG PO TABS
7.5000 mg | ORAL_TABLET | Freq: Every day | ORAL | Status: DC
  Administered 2024-01-25: 7.5 mg via ORAL
  Filled 2024-01-25: qty 1

## 2024-01-25 NOTE — ED Notes (Signed)
 Patient is sleeping. Respirations equal and unlabored, skin warm and dry. No change in assessment or acuity. Routine safety checks conducted according to facility protocol. Will continue to monitor for safety.

## 2024-01-25 NOTE — ED Notes (Signed)
 Patient resting with eyes closed in no apparent acute distress. Respirations even and unlabored. Environment secured. Safety checks in place according to facility policy.

## 2024-01-25 NOTE — Group Note (Signed)
 Group Topic: Change and Accountability  Group Date: 01/25/2024 Start Time: 2000 End Time: 2030 Facilitators: Alvino Joseph, NT  Department: [facility]  Number of Participants: 7  Group Focus: chemical dependency issues Treatment Modality:  Patient-Centered Therapy Interventions utilized were group exercise Purpose: increase insight  Name: Cole [name] Date of Birth: 1981-01-21  MR: [id number]    Level of Participation: active Quality of Participation: cooperative Interactions with others: gave feedback Mood/Affect: appropriate Triggers (if applicable): n/a Cognition: coherent/clear Progress: Moderate Response: n/a Plan: follow-up needed  Patients Problems:  Patient Active Problem List   Diagnosis Date Noted   Benzodiazepine dependence (HCC) 01/24/2024   Drug-seeking behavior 01/24/2024   MDD (major depressive disorder), recurrent episode, severe (HCC) 01/23/2024   Cocaine use disorder, severe, dependence (HCC) 10/06/2023   Cannabis use disorder, severe, dependence (HCC) 10/06/2023   Tardive dyskinesia 02/24/2023   Schizoaffective disorder, bipolar type (HCC) 02/20/2023   Homelessness 12/13/2022   Polysubstance abuse (HCC) 12/13/2022   Substance induced mood disorder (HCC) 12/13/2022   Suicidal ideation 12/13/2022   Severe recurrent major depression (HCC) 07/16/2017   Major depressive disorder, recurrent episode, moderate (HCC) 08/13/2015   Opioid use disorder, severe, dependence (HCC) per history 08/13/2015   Benzodiazepine abuse (HCC) 08/13/2015   Tobacco use disorder 08/13/2015   Hepatitis C antibody test positive 01/22/2015   PTSD (post-traumatic stress disorder) 01/20/2015   Stimulant use disorder (HCC) 01/20/2015   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

## 2024-01-25 NOTE — ED Notes (Signed)
 Patient in hallway on phone, calm and composed. No acute distress noted. No concerns voiced. Informed patient to notify staff with any needs or assistance. Patient verbalized understanding or agreement. Safety checks in place per facility policy.

## 2024-01-25 NOTE — ED Provider Notes (Signed)
 Behavioral Health Progress Note  Date and Time: 01/25/2024 3:06 PM Name: Cole [name] MRN:  [id number]  Subjective:  Jaquay was seen in the common area today. He did not sleep well overnight due to worries about housing and disposition. He tried to call his dad today and he hung up and now wont answer. He wants to mail him a letter or have someone else call for him so he can arrange a visit to his house in [place]. He is eating much better. He has been constipated. He has tied colace but results are hit and miss. He is willing to try miralax . He is having some passive SI and believes that it is due to his circumstances and stressors. He is not having hallucinations or HI  Diagnosis:  Final diagnoses:  Polysubstance dependence (HCC)    Total Time spent with patient: 20 minutes  Past Psychiatric History: previously treated with gabapentin , alprazolam  and subutex .  EMR also has trazodone , depakote , risperdal  and vraylar .  Last Louann Rous was taken to the ER from [facility] due to verbal aggression towards staff.     Is the patient at risk to self? No  Has the patient been a risk to self in the past 6 months? No .    Has the patient been a risk to self within the distant past? Yes   Is the patient a risk to others? No   Has the patient been a risk to others in the past 6 months? Yes   Has the patient been a risk to others within the distant past? Yes    Past Medical History: back pain, TD Family History: several with alcoholism, anxiety, depression. One cousin with completed suicide Social History: currently homeless in [place]. Applied for disability and not currently working. Single. 1 adult child.  Substance: 1/2 ppd cigarettes. Rare alcohol. Regular use of cannabis, cocaine, methamphetamines. Currently on subutex  for opioid dependence (along with gabapentin  and xanax )    Sleep: Poor  Appetite:  Good  Current Medications:  Current Facility-Administered Medications  Medication Dose Route  Frequency Provider Last Rate Last Admin   alum & mag hydroxide-simeth (MAALOX/MYLANTA) 200-200-20 MG/5ML suspension 30 mL  30 mL Oral Q4H PRN Nkwenti, Doris, NP       buprenorphine  (SUBUTEX ) sublingual tablet 8 mg  8 mg Sublingual BID Makeda Peeks, Alonza Arthurs, MD   8 mg at 01/25/24 0915   haloperidol  (HALDOL ) tablet 5 mg  5 mg Oral TID PRN Robet Chiquito, NP       And   diphenhydrAMINE  (BENADRYL ) capsule 50 mg  50 mg Oral TID PRN Robet Chiquito, NP       haloperidol  lactate (HALDOL ) injection 5 mg  5 mg Intramuscular TID PRN Robet Chiquito, NP       And   diphenhydrAMINE  (BENADRYL ) injection 50 mg  50 mg Intramuscular TID PRN Robet Chiquito, NP       And   LORazepam  (ATIVAN ) injection 2 mg  2 mg Intramuscular TID PRN Robet Chiquito, NP       haloperidol  lactate (HALDOL ) injection 10 mg  10 mg Intramuscular TID PRN Robet Chiquito, NP       And   diphenhydrAMINE  (BENADRYL ) injection 50 mg  50 mg Intramuscular TID PRN Robet Chiquito, NP       And   LORazepam  (ATIVAN ) injection 2 mg  2 mg Intramuscular TID PRN Robet Chiquito, NP       gabapentin  (NEURONTIN ) capsule 600 mg  600 mg Oral TID Kathlen Para  Leigh, MD   600 mg at 01/25/24 0915   hydrocortisone  (ANUSOL -HC) 2.5 % rectal cream   Rectal BID Floyce Hutching, MD   1 Application at 01/25/24 1610   hydrOXYzine  (ATARAX ) tablet 25 mg  25 mg Oral TID PRN Robet Chiquito, NP       LORazepam  (ATIVAN ) tablet 1 mg  1 mg Oral Q6H PRN Christella App, Alonza Arthurs, MD       magnesium  hydroxide (MILK OF MAGNESIA) suspension 30 mL  30 mL Oral Daily PRN Nkwenti, Doris, NP       mirtazapine (REMERON) tablet 7.5 mg  7.5 mg Oral QHS Andric Kerce, Alonza Arthurs, MD       nicotine  polacrilex (NICORETTE ) gum 2 mg  2 mg Oral PRN Floyce Hutching, MD   2 mg at 01/25/24 9604   polyethylene glycol (MIRALAX  / GLYCOLAX ) packet 17 g  17 g Oral Daily Floyce Hutching, MD   17 g at 01/25/24 1400   traZODone  (DESYREL ) tablet 50 mg  50 mg Oral QHS PRN Robet Chiquito, NP    50 mg at 01/24/24 2107   Current Outpatient Medications  Medication Sig Dispense Refill   traZODone  (DESYREL ) 100 MG tablet Take 100 mg by mouth at bedtime.     ALPRAZolam  (XANAX ) 1 MG tablet Take 1 mg by mouth 3 (three) times daily.     buprenorphine  (SUBUTEX ) 8 MG SUBL SL tablet Place 8 mg under the tongue 2 (two) times daily.     gabapentin  (NEURONTIN ) 800 MG tablet Take 800 mg by mouth 3 (three) times daily.     naloxone  (NARCAN ) nasal spray 4 mg/0.1 mL SMARTSIG:1 Both Nares Daily     nicotine  polacrilex (NICORETTE ) 2 MG gum Take 2 mg by mouth every 4 (four) hours while awake.      Labs  Lab Results:  Admission on 01/22/2024, Discharged on 01/23/2024  Component Date Value Ref Range Status   Sodium 01/22/2024 141  135 - 145 mmol/L Final   Potassium 01/22/2024 3.5  3.5 - 5.1 mmol/L Final   Chloride 01/22/2024 107  98 - 111 mmol/L Final   CO2 01/22/2024 24  22 - 32 mmol/L Final   Glucose, Bld 01/22/2024 89  70 - 99 mg/dL Final   Glucose reference range applies only to samples taken after fasting for at least 8 hours.   BUN 01/22/2024 7  6 - 20 mg/dL Final   Creatinine, Ser 01/22/2024 0.98  0.61 - 1.24 mg/dL Final   Calcium 54/04/8118 8.7 (L)  8.9 - 10.3 mg/dL Final   Total Protein 14/78/2956 6.7  6.5 - 8.1 g/dL Final   Albumin 21/30/8657 3.7  3.5 - 5.0 g/dL Final   AST 84/69/6295 12 (L)  15 - 41 U/L Final   ALT 01/22/2024 14  0 - 44 U/L Final   Alkaline Phosphatase 01/22/2024 39  38 - 126 U/L Final   Total Bilirubin 01/22/2024 0.5  0.0 - 1.2 mg/dL Final   GFR, Estimated 01/22/2024 >60  >60 mL/min Final   Comment: (NOTE) Calculated using the CKD-EPI Creatinine Equation (2021)    Anion gap 01/22/2024 10  5 - 15 Final   Performed at [facility], [address] [place], [place] [address]   Alcohol, Ethyl (B) 01/22/2024 17 (H)  <15 mg/dL Final   Comment: (NOTE) For medical purposes only. Performed at [facility], [address], [place], [place] [address]    WBC  01/22/2024 5.0  4.0 - 10.5 K/uL Final  RBC 01/22/2024 4.97  4.22 - 5.81 MIL/uL Final   Hemoglobin 01/22/2024 12.8 (L)  13.0 - 17.0 g/dL Final   HCT 95/28/4132 38.6 (L)  39.0 - 52.0 % Final   MCV 01/22/2024 77.7 (L)  80.0 - 100.0 fL Final   MCH 01/22/2024 25.8 (L)  26.0 - 34.0 pg Final   MCHC 01/22/2024 33.2  30.0 - 36.0 g/dL Final   RDW 44/08/270 13.6  11.5 - 15.5 % Final   Platelets 01/22/2024 147 (L)  150 - 400 K/uL Final   nRBC 01/22/2024 0.0  0.0 - 0.2 % Final   Performed at [facility], [address] [place], [place] [address]   Opiates 01/22/2024 NONE DETECTED  NONE DETECTED Final   Cocaine 01/22/2024 POSITIVE (A)  NONE DETECTED Final   Benzodiazepines 01/22/2024 NONE DETECTED  NONE DETECTED Final   Amphetamines 01/22/2024 NONE DETECTED  NONE DETECTED Final   Tetrahydrocannabinol 01/22/2024 POSITIVE (A)  NONE DETECTED Final   Barbiturates 01/22/2024 NONE DETECTED  NONE DETECTED Final   Comment: (NOTE) DRUG SCREEN FOR MEDICAL PURPOSES ONLY.  IF CONFIRMATION IS NEEDED FOR ANY PURPOSE, NOTIFY LAB WITHIN 5 DAYS.  LOWEST DETECTABLE LIMITS FOR URINE DRUG SCREEN Drug Class                     Cutoff (ng/mL) Amphetamine and metabolites    1000 Barbiturate and metabolites    200 Benzodiazepine                 200 Opiates and metabolites        300 Cocaine and metabolites        300 THC                            50 Performed at [facility], [address] [place], [place] [address]   Admission on 10/10/2023, Discharged on 10/10/2023  Component Date Value Ref Range Status   Sodium 10/10/2023 141  135 - 145 mmol/L Final   Potassium 10/10/2023 4.4  3.5 - 5.1 mmol/L Final   Chloride 10/10/2023 106  98 - 111 mmol/L Final   CO2 10/10/2023 27  22 - 32 mmol/L Final   Glucose, Bld 10/10/2023 106 (H)  70 - 99 mg/dL Final   Glucose reference range applies only to samples taken after fasting for at least 8 hours.   BUN 10/10/2023 12  6 - 20 mg/dL Final   Creatinine, Ser 10/10/2023  0.85  0.61 - 1.24 mg/dL Final   Calcium 42/59/5638 9.4  8.9 - 10.3 mg/dL Final   GFR, Estimated 10/10/2023 >60  >60 mL/min Final   Comment: (NOTE) Calculated using the CKD-EPI Creatinine Equation (2021)    Anion gap 10/10/2023 8  5 - 15 Final   Performed at [facility], [address] [place], [place] [address]   WBC 10/10/2023 7.3  4.0 - 10.5 K/uL Final   RBC 10/10/2023 5.30  4.22 - 5.81 MIL/uL Final   Hemoglobin 10/10/2023 13.4  13.0 - 17.0 g/dL Final   HCT 32/95/1884 41.8  39.0 - 52.0 % Final   MCV 10/10/2023 78.9 (L)  80.0 - 100.0 fL Final   MCH 10/10/2023 25.3 (L)  26.0 - 34.0 pg Final   MCHC 10/10/2023 32.1  30.0 - 36.0 g/dL Final   RDW 16/60/6301 13.4  11.5 - 15.5 % Final   Platelets 10/10/2023 167  150 - 400 K/uL Final   nRBC  10/10/2023 0.0  0.0 - 0.2 % Final   Performed at [facility], [address], [place], [place] [address]   Troponin I (High Sensitivity) 10/10/2023 <2  <18 ng/L Final   Comment: (NOTE) Elevated high sensitivity troponin I (hsTnI) values and significant  changes across serial measurements may suggest ACS but many other  chronic and acute conditions are known to elevate hsTnI results.  Refer to the "Links" section for chest pain algorithms and additional  guidance. Performed at [facility], [address], [place], [place] [address]    Troponin I (High Sensitivity) 10/10/2023 2  <18 ng/L Final   Comment: (NOTE) Elevated high sensitivity troponin I (hsTnI) values and significant  changes across serial measurements may suggest ACS but many other  chronic and acute conditions are known to elevate hsTnI results.  Refer to the "Links" section for chest pain algorithms and additional  guidance. Performed at [facility], [address], [place], [place] [address]   Admission on 10/06/2023, Discharged on 10/06/2023  Component Date Value Ref Range Status   Sodium 10/06/2023 138  135 - 145 mmol/L Final   Potassium 10/06/2023 4.4   3.5 - 5.1 mmol/L Final   Chloride 10/06/2023 100  98 - 111 mmol/L Final   CO2 10/06/2023 27  22 - 32 mmol/L Final   Glucose, Bld 10/06/2023 104 (H)  70 - 99 mg/dL Final   Glucose reference range applies only to samples taken after fasting for at least 8 hours.   BUN 10/06/2023 26 (H)  6 - 20 mg/dL Final   Creatinine, Ser 10/06/2023 1.14  0.61 - 1.24 mg/dL Final   Calcium 91/47/8295 9.4  8.9 - 10.3 mg/dL Final   Total Protein 62/13/0865 8.2 (H)  6.5 - 8.1 g/dL Final   Albumin 78/46/9629 4.2  3.5 - 5.0 g/dL Final   AST 52/84/1324 20  15 - 41 U/L Final   ALT 10/06/2023 21  0 - 44 U/L Final   Alkaline Phosphatase 10/06/2023 50  38 - 126 U/L Final   Total Bilirubin 10/06/2023 0.9  0.0 - 1.2 mg/dL Final   GFR, Estimated 10/06/2023 >60  >60 mL/min Final   Comment: (NOTE) Calculated using the CKD-EPI Creatinine Equation (2021)    Anion gap 10/06/2023 11  5 - 15 Final   Performed at [facility], [address], [place], [place] [address]   Alcohol, Ethyl (B) 10/06/2023 <10  <10 mg/dL Final   Comment: (NOTE) Lowest detectable limit for serum alcohol is 10 mg/dL.  For medical purposes only. Performed at [facility], [address], [place], [place] [address]    Opiates 10/06/2023 NONE DETECTED  NONE DETECTED Final   Cocaine 10/06/2023 POSITIVE (A)  NONE DETECTED Final   Benzodiazepines 10/06/2023 POSITIVE (A)  NONE DETECTED Final   Amphetamines 10/06/2023 POSITIVE (A)  NONE DETECTED Final   Tetrahydrocannabinol 10/06/2023 POSITIVE (A)  NONE DETECTED Final   Barbiturates 10/06/2023 NONE DETECTED  NONE DETECTED Final   Comment: (NOTE) DRUG SCREEN FOR MEDICAL PURPOSES ONLY.  IF CONFIRMATION IS NEEDED FOR ANY PURPOSE, NOTIFY LAB WITHIN 5 DAYS.  LOWEST DETECTABLE LIMITS FOR URINE DRUG SCREEN Drug Class                     Cutoff (ng/mL) Amphetamine and metabolites    1000 Barbiturate and metabolites    200 Benzodiazepine  200 Opiates and metabolites         300 Cocaine and metabolites        300 THC                            50 Performed at [facility], [address], [place], [place] [address]    WBC 10/06/2023 5.4  4.0 - 10.5 K/uL Final   RBC 10/06/2023 6.07 (H)  4.22 - 5.81 MIL/uL Final   Hemoglobin 10/06/2023 15.3  13.0 - 17.0 g/dL Final   HCT 11/91/4782 47.7  39.0 - 52.0 % Final   MCV 10/06/2023 78.6 (L)  80.0 - 100.0 fL Final   MCH 10/06/2023 25.2 (L)  26.0 - 34.0 pg Final   MCHC 10/06/2023 32.1  30.0 - 36.0 g/dL Final   RDW 95/62/1308 13.5  11.5 - 15.5 % Final   Platelets 10/06/2023 202  150 - 400 K/uL Final   nRBC 10/06/2023 0.0  0.0 - 0.2 % Final   Neutrophils Relative % 10/06/2023 51  % Final   Neutro Abs 10/06/2023 2.8  1.7 - 7.7 K/uL Final   Lymphocytes Relative 10/06/2023 33  % Final   Lymphs Abs 10/06/2023 1.8  0.7 - 4.0 K/uL Final   Monocytes Relative 10/06/2023 13  % Final   Monocytes Absolute 10/06/2023 0.7  0.1 - 1.0 K/uL Final   Eosinophils Relative 10/06/2023 2  % Final   Eosinophils Absolute 10/06/2023 0.1  0.0 - 0.5 K/uL Final   Basophils Relative 10/06/2023 1  % Final   Basophils Absolute 10/06/2023 0.0  0.0 - 0.1 K/uL Final   Immature Granulocytes 10/06/2023 0  % Final   Abs Immature Granulocytes 10/06/2023 0.01  0.00 - 0.07 K/uL Final   Performed at [facility], [address], [place], [place] [address]   Valproic Acid  Lvl 10/06/2023 73  50.0 - 100.0 ug/mL Final   Performed at [facility], [address], [place], [place] [address]  Admission on 10/02/2023, Discharged on 10/02/2023  Component Date Value Ref Range Status   Sodium 10/02/2023 138  135 - 145 mmol/L Final   Potassium 10/02/2023 3.7  3.5 - 5.1 mmol/L Final   Chloride 10/02/2023 103  98 - 111 mmol/L Final   CO2 10/02/2023 28  22 - 32 mmol/L Final   Glucose, Bld 10/02/2023 82  70 - 99 mg/dL Final   Glucose reference range applies only to samples taken after fasting for at least 8 hours.   BUN 10/02/2023 24 (H)  6 - 20 mg/dL  Final   Creatinine, Ser 10/02/2023 0.82  0.61 - 1.24 mg/dL Final   Calcium 52/84/1324 8.7 (L)  8.9 - 10.3 mg/dL Final   Total Protein 40/05/2724 7.0  6.5 - 8.1 g/dL Final   Albumin 36/64/4034 3.7  3.5 - 5.0 g/dL Final   AST 74/25/9563 34  15 - 41 U/L Final   ALT 10/02/2023 24  0 - 44 U/L Final   Alkaline Phosphatase 10/02/2023 46  38 - 126 U/L Final   Total Bilirubin 10/02/2023 0.8  0.0 - 1.2 mg/dL Final   GFR, Estimated 10/02/2023 >60  >60 mL/min Final   Comment: (NOTE) Calculated using the CKD-EPI Creatinine Equation (2021)    Anion gap 10/02/2023 7  5 - 15 Final   Performed at [facility], [address], [place], [place] [address]   Alcohol, Ethyl (B) 10/02/2023 <10  <10 mg/dL  Final   Comment: (NOTE) Lowest detectable limit for serum alcohol is 10 mg/dL.  For medical purposes only. Performed at [facility], [address] [place], [place] [address]    Salicylate Lvl 10/02/2023 <7.0 (L)  7.0 - 30.0 mg/dL Final   Performed at [facility], [address] [place], [place] [address]   Acetaminophen  (Tylenol ), Serum 10/02/2023 <10 (L)  10 - 30 ug/mL Final   Comment: (NOTE) Therapeutic concentrations vary significantly. A range of 10-30 ug/mL  may be an effective concentration for many patients. However, some  are best treated at concentrations outside of this range. Acetaminophen  concentrations >150 ug/mL at 4 hours after ingestion  and >50 ug/mL at 12 hours after ingestion are often associated with  toxic reactions.  Performed at [facility], [address] [place], [place] [address]    WBC 10/02/2023 4.8  4.0 - 10.5 K/uL Final   RBC 10/02/2023 4.92  4.22 - 5.81 MIL/uL Final   Hemoglobin 10/02/2023 12.8 (L)  13.0 - 17.0 g/dL Final   HCT 91/47/8295 39.2  39.0 - 52.0 % Final   MCV 10/02/2023 79.7 (L)  80.0 - 100.0 fL Final   MCH 10/02/2023 26.0  26.0 - 34.0 pg Final   MCHC 10/02/2023 32.7  30.0 - 36.0 g/dL Final   RDW 62/13/0865 13.5  11.5 - 15.5 % Final   Platelets  10/02/2023 162  150 - 400 K/uL Final   nRBC 10/02/2023 0.0  0.0 - 0.2 % Final   Performed at [facility], [address] [place], [place] [address]   Opiates 10/02/2023 NONE DETECTED  NONE DETECTED Final   Cocaine 10/02/2023 POSITIVE (A)  NONE DETECTED Final   Benzodiazepines 10/02/2023 NONE DETECTED  NONE DETECTED Final   Amphetamines 10/02/2023 NONE DETECTED  NONE DETECTED Final   Tetrahydrocannabinol 10/02/2023 POSITIVE (A)  NONE DETECTED Final   Barbiturates 10/02/2023 NONE DETECTED  NONE DETECTED Final   Comment: (NOTE) DRUG SCREEN FOR MEDICAL PURPOSES ONLY.  IF CONFIRMATION IS NEEDED FOR ANY PURPOSE, NOTIFY LAB WITHIN 5 DAYS.  LOWEST DETECTABLE LIMITS FOR URINE DRUG SCREEN Drug Class                     Cutoff (ng/mL) Amphetamine and metabolites    1000 Barbiturate and metabolites    200 Benzodiazepine                 200 Opiates and metabolites        300 Cocaine and metabolites        300 THC                            50 Performed at [facility], [address] [place], [place] [address]   Admission on 08/07/2023, Discharged on 08/07/2023  Component Date Value Ref Range Status   Sodium 08/07/2023 137  135 - 145 mmol/L Final   Potassium 08/07/2023 3.7  3.5 - 5.1 mmol/L Final   Chloride 08/07/2023 99  98 - 111 mmol/L Final   CO2 08/07/2023 29  22 - 32 mmol/L Final   Glucose, Bld 08/07/2023 69 (L)  70 - 99 mg/dL Final   Glucose reference range applies only to samples taken after fasting for at least 8 hours.   BUN 08/07/2023 17  6 - 20 mg/dL Final   Creatinine, Ser 08/07/2023 0.88  0.61 - 1.24 mg/dL Final   Calcium 84/13/2440 9.4  8.9 - 10.3 mg/dL  Final   Total Protein 08/07/2023 8.1  6.5 - 8.1 g/dL Final   Albumin 16/05/9603 4.5  3.5 - 5.0 g/dL Final   AST 54/04/8118 21  15 - 41 U/L Final   ALT 08/07/2023 27  0 - 44 U/L Final   Alkaline Phosphatase 08/07/2023 47  38 - 126 U/L Final   Total Bilirubin 08/07/2023 0.8  <1.2 mg/dL Final   GFR, Estimated 08/07/2023 >60   >60 mL/min Final   Comment: (NOTE) Calculated using the CKD-EPI Creatinine Equation (2021)    Anion gap 08/07/2023 9  5 - 15 Final   Performed at [facility], [address], [place], [place] [address]   Alcohol, Ethyl (B) 08/07/2023 <10  <10 mg/dL Final   Comment: (NOTE) Lowest detectable limit for serum alcohol is 10 mg/dL.  For medical purposes only. Performed at [facility], [address] [place], [place] [address]    WBC 08/07/2023 9.0  4.0 - 10.5 K/uL Final   RBC 08/07/2023 5.90 (H)  4.22 - 5.81 MIL/uL Final   Hemoglobin 08/07/2023 15.0  13.0 - 17.0 g/dL Final   HCT 30/86/5784 47.6  39.0 - 52.0 % Final   MCV 08/07/2023 80.7  80.0 - 100.0 fL Final   MCH 08/07/2023 25.4 (L)  26.0 - 34.0 pg Final   MCHC 08/07/2023 31.5  30.0 - 36.0 g/dL Final   RDW 69/62/9528 13.4  11.5 - 15.5 % Final   Platelets 08/07/2023 167  150 - 400 K/uL Final   nRBC 08/07/2023 0.0  0.0 - 0.2 % Final   Performed at [facility], [address] [place], [place] [address]  Admission on 07/29/2023, Discharged on 08/02/2023  Component Date Value Ref Range Status   Ferritin 07/30/2023 26  24 - 336 ng/mL Final   Performed at [facility], [address] [place], [place] [address]   Hgb A1c MFr Bld 07/30/2023 5.5  4.8 - 5.6 % Final   Comment: (NOTE) Pre diabetes:          5.7%-6.4%  Diabetes:              >6.4%  Glycemic control for   <7.0% adults with diabetes    Mean Plasma Glucose 07/30/2023 111.15  mg/dL Final   Performed at [facility], [address], [place], [place] [address]   TSH 07/30/2023 3.653  0.350 - 4.500 uIU/mL Final   Comment: Performed by a 3rd Generation assay with a functional sensitivity of <=0.01 uIU/mL. Performed at [facility], [address] [place], [place] [address]    Cholesterol 07/30/2023 147  0 - 200 mg/dL Final   Triglycerides 47/42/5956 83  <150 mg/dL Final   HDL 38/75/6433 56  >40 mg/dL Final   Total CHOL/HDL Ratio  07/30/2023 2.6  RATIO Final   VLDL 07/30/2023 17  0 - 40 mg/dL Final   LDL Cholesterol 07/30/2023 74  0 - 99 mg/dL Final   Comment:        Total Cholesterol/HDL:CHD Risk Coronary Heart Disease Risk Table                     Men   Women  1/2 Average Risk   3.4   3.3  Average Risk       5.0   4.4  2 X Average Risk   9.6   7.1  3 X Average Risk  23.4   11.0  Use the calculated Patient Ratio above and the CHD Risk Table to determine the patient's CHD Risk.        ATP III CLASSIFICATION (LDL):  <100     mg/dL   Optimal  578-469  mg/dL   Near or Above                    Optimal  130-159  mg/dL   Borderline  629-528  mg/dL   High  >413     mg/dL   Very High Performed at [facility], [address], [place], [place] [address]    Hepatitis B Surface Ag 07/30/2023 NON REACTIVE  NON REACTIVE Final   HCV Ab 07/30/2023 Reactive (A)  NON REACTIVE Final   Comment: (NOTE) The CDC recommends that a Reactive HCV antibody result be followed up  with a HCV Nucleic Acid Amplification test.     Hep A IgM 07/30/2023 NON REACTIVE  NON REACTIVE Final   Hep B C IgM 07/30/2023 NON REACTIVE  NON REACTIVE Final   Performed at [facility], [address] [place], [place] [address]   Valproic Acid  Lvl 07/30/2023 58  50.0 - 100.0 ug/mL Final   Performed at [facility], [address], [place], [place] [address]   Iron 07/30/2023 48  45 - 182 ug/dL Final   TIBC 03/47/4259 421  250 - 450 ug/dL Final   Saturation Ratios 07/30/2023 11 (L)  17.9 - 39.5 % Final   UIBC 07/30/2023 373  ug/dL Final   Performed at [facility], [address], [place], [place] [address]  Admission on 07/28/2023, Discharged on 07/29/2023  Component Date Value Ref Range Status   Sodium 07/28/2023 138  135 - 145 mmol/L Final   Potassium 07/28/2023 3.5  3.5 - 5.1 mmol/L Final   Chloride 07/28/2023 107  98 - 111 mmol/L Final   CO2 07/28/2023 26  22 - 32 mmol/L  Final   Glucose, Bld 07/28/2023 91  70 - 99 mg/dL Final   Glucose reference range applies only to samples taken after fasting for at least 8 hours.   BUN 07/28/2023 14  6 - 20 mg/dL Final   Creatinine, Ser 07/28/2023 0.74  0.61 - 1.24 mg/dL Final   Calcium 56/43/3295 8.7 (L)  8.9 - 10.3 mg/dL Final   Total Protein 18/84/1660 7.0  6.5 - 8.1 g/dL Final   Albumin 63/08/6008 3.8  3.5 - 5.0 g/dL Final   AST 93/23/5573 14 (L)  15 - 41 U/L Final   ALT 07/28/2023 16  0 - 44 U/L Final   Alkaline Phosphatase 07/28/2023 38  38 - 126 U/L Final   Total Bilirubin 07/28/2023 1.1  <1.2 mg/dL Final   GFR, Estimated 07/28/2023 >60  >60 mL/min Final   Comment: (NOTE) Calculated using the CKD-EPI Creatinine Equation (2021)    Anion gap 07/28/2023 5  5 - 15 Final   Performed at [facility], [address], [place], [place] [address]   Alcohol, Ethyl (B) 07/28/2023 <10  <10 mg/dL Final   Comment: (NOTE) Lowest detectable limit for serum alcohol is 10 mg/dL.  For medical purposes only. Performed at [facility], [address], [place], [place] [address]    Opiates 07/28/2023 NONE DETECTED  NONE DETECTED Final   Cocaine 07/28/2023 POSITIVE (A)  NONE DETECTED Final   Benzodiazepines 07/28/2023 POSITIVE (A)  NONE DETECTED Final   Amphetamines 07/28/2023 NONE DETECTED  NONE DETECTED Final  Tetrahydrocannabinol 07/28/2023 POSITIVE (A)  NONE DETECTED Final   Barbiturates 07/28/2023 NONE DETECTED  NONE DETECTED Final   Comment: (NOTE) DRUG SCREEN FOR MEDICAL PURPOSES ONLY.  IF CONFIRMATION IS NEEDED FOR ANY PURPOSE, NOTIFY LAB WITHIN 5 DAYS.  LOWEST DETECTABLE LIMITS FOR URINE DRUG SCREEN Drug Class                     Cutoff (ng/mL) Amphetamine and metabolites    1000 Barbiturate and metabolites    200 Benzodiazepine                 200 Opiates and metabolites        300 Cocaine and metabolites        300 THC                            50 Performed at [facility], [address], [place],  [place] [address]    WBC 07/28/2023 4.9  4.0 - 10.5 K/uL Final   RBC 07/28/2023 5.03  4.22 - 5.81 MIL/uL Final   Hemoglobin 07/28/2023 12.8 (L)  13.0 - 17.0 g/dL Final   HCT 62/13/0865 39.7  39.0 - 52.0 % Final   MCV 07/28/2023 78.9 (L)  80.0 - 100.0 fL Final   MCH 07/28/2023 25.4 (L)  26.0 - 34.0 pg Final   MCHC 07/28/2023 32.2  30.0 - 36.0 g/dL Final   RDW 78/46/9629 13.4  11.5 - 15.5 % Final   Platelets 07/28/2023 148 (L)  150 - 400 K/uL Final   nRBC 07/28/2023 0.0  0.0 - 0.2 % Final   Neutrophils Relative % 07/28/2023 52  % Final   Neutro Abs 07/28/2023 2.5  1.7 - 7.7 K/uL Final   Lymphocytes Relative 07/28/2023 38  % Final   Lymphs Abs 07/28/2023 1.8  0.7 - 4.0 K/uL Final   Monocytes Relative 07/28/2023 8  % Final   Monocytes Absolute 07/28/2023 0.4  0.1 - 1.0 K/uL Final   Eosinophils Relative 07/28/2023 2  % Final   Eosinophils Absolute 07/28/2023 0.1  0.0 - 0.5 K/uL Final   Basophils Relative 07/28/2023 0  % Final   Basophils Absolute 07/28/2023 0.0  0.0 - 0.1 K/uL Final   Immature Granulocytes 07/28/2023 0  % Final   Abs Immature Granulocytes 07/28/2023 0.01  0.00 - 0.07 K/uL Final   Performed at [facility], [address] [place], [place] [address]   Salicylate Lvl 07/28/2023 <7.0 (L)  7.0 - 30.0 mg/dL Final   Performed at [facility], [address] [place], [place] [address]   Acetaminophen  (Tylenol ), Serum 07/28/2023 <10 (L)  10 - 30 ug/mL Final   Comment: (NOTE) Therapeutic concentrations vary significantly. A range of 10-30 ug/mL  may be an effective concentration for many patients. However, some  are best treated at concentrations outside of this range. Acetaminophen  concentrations >150 ug/mL at 4 hours after ingestion  and >50 ug/mL at 12 hours after ingestion are often associated with  toxic reactions.  Performed at [facility], [address], [place], [place] [address]     Blood Alcohol level:  Lab Results  Component Value Date   ETH 17 (H) 01/22/2024   ETH  <10 10/06/2023    Metabolic Disorder Labs: Lab Results  Component Value Date   HGBA1C 5.5 07/30/2023   MPG 111.15 07/30/2023   MPG 105.41 02/21/2023   No results found for: "PROLACTIN" Lab Results  Component Value  Date   CHOL 147 07/30/2023   TRIG 83 07/30/2023   HDL 56 07/30/2023   CHOLHDL 2.6 07/30/2023   VLDL 17 07/30/2023   LDLCALC 74 07/30/2023   LDLCALC 69 02/21/2023    Therapeutic Lab Levels: No results found for: "LITHIUM" Lab Results  Component Value Date   VALPROATE 73 10/06/2023   VALPROATE 58 07/30/2023   Lab Results  Component Value Date   CBMZ 7.4 07/24/2013    Physical Findings   AIMS    Flowsheet Row [facility]) [facility]) [facility] Ad[facility] (Discharged) [facility]16[facility]2017 [facility] Admission (Discharged) [facility] Ad[facility] (Discharged) [facility]  AIMS Total Score 0 9 0 0 0      AUDIT    Flowsheet Row [facility]) [facility] Ad[facility] (Discharged) [facility] Admission (Discharged) from 08/13/2015 in [facility]PAT[facility] BE[facility]AV[facility]ICINE Admission (Discharged) [facility] Ad[facility] (Discharged) [facility]  Alcohol Use Disorder Identification Test Final Score (AUDIT) 16 16 0 0 0      PHQ2-9    Flowsheet Row ED from 10/06/2023 in [facility] ED from 10/02/2023 in [facility]  PHQ-2 Total Score 4 0  PHQ-9 Total Score 16 18      Flowsheet Row ED from 01/23/2024 in [facility] ED from  01/22/2024 in [facility] ED from 10/10/2023 in [facility] Department at [facility]  C-SSRS RISK CATEGORY High Risk High Risk No Risk        Musculoskeletal  Strength & Muscle Tone: within normal limits Gait & Station: normal Patient leans: N/A  Psychiatric Specialty Exam  Presentation  General Appearance:  Appropriate for Environment; Casual  Eye Contact: Good  Speech: Normal Rate  Speech Volume: Normal  Handedness: Right   Mood and Affect  Mood: Euthymic  Affect: Blunt   Thought Process  Thought Processes: Goal Directed; Linear  Descriptions of Associations:Intact  Orientation:Full (Time, Place and Person)  Thought Content:Logical  Diagnosis of Schizophrenia or Schizoaffective disorder in past: Yes    Hallucinations:Hallucinations: None  Ideas of Reference:None  Suicidal Thoughts:Suicidal Thoughts: -- (+conditional, circumstatial SI)  Homicidal Thoughts:Homicidal Thoughts: No   Sensorium  Memory: Immediate Fair; Recent Fair; Remote Fair  Judgment: Fair  Insight: Fair   Art therapist  Concentration: Fair  Attention Span: Fair  Recall: [employer] of Knowledge: Fair  Language: Fair   Psychomotor Activity  Psychomotor Activity: Psychomotor Activity: Normal   Assets  Assets: Communication Skills; Leisure Time; Desire for Improvement   Sleep  Sleep: Sleep: Poor   Nutritional Assessment (For OBS and FBC admissions only) Has the patient had a weight loss or gain of 10 pounds or more in the last 3 months?: No Has the patient had a decrease in food intake/or appetite?: Yes Does the patient have dental problems?: No Does the patient have eating habits or behaviors that may be indicators of an eating disorder including binging or inducing vomiting?: No Has the patient recently lost weight without trying?: 1 Has the patient been eating poorly because of a  decreased appetite?: 1 Malnutrition  Screening Tool Score: 2    Physical Exam  Physical Exam Vitals and nursing note reviewed.  Constitutional:      Appearance: Normal appearance.  HENT:     Head: Normocephalic and atraumatic.  Eyes:     Extraocular Movements: Extraocular movements intact.  Pulmonary:     Effort: Pulmonary effort is normal.  Musculoskeletal:        General: Normal range of motion.     Cervical back: Normal range of motion.  Neurological:     General: No focal deficit present.     Mental Status: He is alert and oriented to person, place, and time.  Psychiatric:        Behavior: Behavior normal.        Cognition and Memory: Cognition normal.    Review of Systems  Constitutional:  Negative for chills and fever.  Respiratory:  Negative for cough.   Cardiovascular:  Negative for chest pain.  Gastrointestinal:  Positive for constipation. Negative for diarrhea, nausea and vomiting.  Musculoskeletal:  Positive for back pain. Negative for myalgias.  Psychiatric/Behavioral:  Positive for suicidal ideas. Negative for hallucinations. The patient is nervous/anxious.    Blood pressure (!) 121/91, pulse (!) 58, temperature 97.8 F (36.6 C), temperature source Oral, resp. rate 18, SpO2 99%. There is no height or weight on file to calculate BMI.  Treatment Plan Summary: Long Term Goals: Improvement in symptoms so as ready for discharge   Short Term Goals: Patient will verbalize feelings in meetings with treatment team members., Patient will attend at least of 50% of the groups daily., Pt will complete the PHQ9 on admission, day 3 and discharge., and Patient will take medications as prescribed daily.  Medications: Mood/anxiety: continue group therapy, milieu therapy, 1:1 evaluation with provider.  Medication management: started remeron 7.5mg  PO at bedtime for mood, sleep and anxiety  Substance Abuse:  brief intervention provided abstinence advised.  stimulant use:  encourage patient to maintain PO intake. Coverage PRN for secondary psychosis.  Cannabis use disorder: Encouraging insight. Education provided on the effect of cannabis on mental health. Coverage for withdrawal symptoms and nausea.  Benzodiazepine dependence: monitoring for withdrawal with CIWA and PRN lorazepam  for CIWA >10 Continue subutex  8mg  PO BID Continue gabapentin  600mg  PO TID Nicotine  and tobacco use: Nicotine  replacement therapy provided.  Medical: PRNs for pain, constipation, indigestion available.  Labs/studies: no new labs Miralax  17g PO daily for constipation Safety and Monitoring: voluntarily admission to [facility]/Facility based care unit [facility]) unit for safety, stabilization and treatment Daily contact with patient to assess and evaluate symptoms and progress in treatment Patient's case to be discussed in multi-disciplinary team meeting Observation Level : q15 minute checks Vital signs: q12 hours Precautions: withdrawal  Based on my evaluation the patient does not appear to have an emergency medical condition.   Floyce Hutching, MD 01/25/2024 3:06 PM

## 2024-01-25 NOTE — Group Note (Signed)
 Group Topic: Overcoming Obstacles  Group Date: 01/25/2024 Start Time: 1700 End Time: 1730 Facilitators: Dennis Fitting, NT  Department: [facility]  Number of Participants: 7  Group Focus: goals/reality orientation Treatment Modality:  Psychoeducation Interventions utilized were clarification, exploration, and problem solving Purpose: increase insight  Name: Cole [name] Date of Birth: 06/21/1981  MR: [id number]    Level of Participation: active Quality of Participation: cooperative and engaged Interactions with others: gave feedback Mood/Affect: appropriate and positive Triggers (if applicable): N/A Cognition: confused and loose Progress: Gaining insight Response: In the beginning of the group, patient appeared to be responding to internal stimuli evident by him covering his ears and grimacing even when the room was quiet. Patient shared information about the personal goals he already has set including saving money and going to school Plan: patient will be encouraged to complete worksheet identifying personal problem areas and corresponding goals to aid in alleviating the identified problems Patients Problems:  Patient Active Problem List   Diagnosis Date Noted   Benzodiazepine dependence (HCC) 01/24/2024   Drug-seeking behavior 01/24/2024   MDD (major depressive disorder), recurrent episode, severe (HCC) 01/23/2024   Cocaine use disorder, severe, dependence (HCC) 10/06/2023   Cannabis use disorder, severe, dependence (HCC) 10/06/2023   Tardive dyskinesia 02/24/2023   Schizoaffective disorder, bipolar type (HCC) 02/20/2023   Homelessness 12/13/2022   Polysubstance abuse (HCC) 12/13/2022   Substance induced mood disorder (HCC) 12/13/2022   Suicidal ideation 12/13/2022   Severe recurrent major depression (HCC) 07/16/2017   Major depressive disorder, recurrent episode, moderate (HCC) 08/13/2015   Opioid use disorder, severe, dependence (HCC) per  history 08/13/2015   Benzodiazepine abuse (HCC) 08/13/2015   Tobacco use disorder 08/13/2015   Hepatitis C antibody test positive 01/22/2015   PTSD (post-traumatic stress disorder) 01/20/2015   Stimulant use disorder (HCC) 01/20/2015   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

## 2024-01-25 NOTE — ED Notes (Signed)
 Patient alert & oriented x4. Patient reports passive SI but denies intent or plan to harm self. Patient denies HI / A/VH. Patient denies any physical complaints when asked. Scheduled medications administered with no complications. No acute distress noted. Support and encouragement provided. Routine safety checks conducted per facility protocol. Encouraged patient to notify staff if any thoughts of harm towards self or others arise. Patient verbalizes understanding and agreement.

## 2024-01-25 NOTE — Discharge Planning (Signed)
 SW provided patient with a list of [place] houses for [place] as well as a Horticulturist, commercial for shelters as requested. Will continue to assist and follow.

## 2024-01-26 DIAGNOSIS — F32A Depression, unspecified: Secondary | ICD-10-CM | POA: Diagnosis not present

## 2024-01-26 DIAGNOSIS — F17210 Nicotine dependence, cigarettes, uncomplicated: Secondary | ICD-10-CM | POA: Diagnosis not present

## 2024-01-26 DIAGNOSIS — F1120 Opioid dependence, uncomplicated: Secondary | ICD-10-CM | POA: Diagnosis not present

## 2024-01-26 DIAGNOSIS — G8929 Other chronic pain: Secondary | ICD-10-CM | POA: Diagnosis not present

## 2024-01-26 MED ORDER — GABAPENTIN 300 MG PO CAPS
600.0000 mg | ORAL_CAPSULE | Freq: Three times a day (TID) | ORAL | 1 refills | Status: DC
Start: 2024-01-26 — End: 2024-02-04

## 2024-01-26 MED ORDER — POLYETHYLENE GLYCOL 3350 17 G PO PACK: 17.0000 g | PACK | Freq: Every day | ORAL | Status: DC

## 2024-01-26 MED ORDER — MIRTAZAPINE 7.5 MG PO TABS: 7.5000 mg | ORAL_TABLET | Freq: Every day | ORAL | 0 refills | Status: AC

## 2024-01-26 MED ORDER — NICOTINE POLACRILEX 2 MG MT GUM: 2.0000 mg | CHEWING_GUM | OROMUCOSAL | 0 refills | Status: AC | PRN

## 2024-01-26 MED ORDER — HYDROCORTISONE (PERIANAL) 2.5 % EX CREA: TOPICAL_CREAM | Freq: Two times a day (BID) | CUTANEOUS | Status: AC

## 2024-01-26 MED ORDER — TRAZODONE HCL 50 MG PO TABS: 50.0000 mg | ORAL_TABLET | Freq: Every evening | ORAL | 0 refills | Status: DC | PRN

## 2024-01-26 NOTE — ED Notes (Signed)
 Patient sitting in hallway calm and composed. No acute distress noted. No concerns voiced. Informed patient to notify staff with any needs or assistance. Patient verbalized understanding or agreement. Safety checks in place per facility policy.

## 2024-01-26 NOTE — Discharge Instructions (Addendum)
 Cole [name]       Based on what you have shared, a list of resources for outpatient therapy and psychiatry is provided below to get you started back on treatment.  It is imperative that you follow through with treatment within 5-7 days from the day of discharge to prevent any further risk to your safety or mental well-being.  You are not limited to the list provided.  In case of an urgent crisis, you may contact the Mobile Crisis Unit with [facility] at [phone].       Cole [name]  WHO WE ARE [employer] [facility] [place] , established in 1991, is a Statistician of recovery residences, chartered by [employer], [employer] the 501(c)(3) [employer]. [employer] are peer-driven, Database administrator run, and self-supported group residences for individuals in recovery from a substance use disorder. Currently, the network of [employer] is over 3,300 in the [place] . The first [place]  [address] were established in [place] and [place], [place]. As of March 2022, there are 287 houses in  , with locations statewide. With an average of 8 beds per [name], there are more than 2,200 available beds across the state. Individuals typically enter an [employer] after completing a treatment program or reentering from a correctional setting.  Individuals living in a [name] are expected to participate in a recovery program in the community during their residence. [employer] operate Database administrator, electing [name] officers who serve six-month terms.  Houses are financially self-supporting; members split [name] expenses, which average  $110.00 to $160.00 per person per week.  Any Resident who has a recurrence of the use of alcohol and/or illicit drugs must be immediately expelled.     The [employer] Network: A Self-Run Structure Three or more [employer] within a 100-mile radius comprise an [employer]. A representative  of each [name] in the Chapter meets with the others on a monthly basis, to exchange information, to seek resolution of problems in a particular [name], and to express that Chapter's vote on larger issues. The [employer] is comprised of 12 members: 9 of which presently live in an [address], and 3 alumni.  Members are elected each year at the [facility].  The primary mission of the [employer] is to facilitate adherence to [employer]' concept and system of operations, by providing effective means of communication and mission focus between the various organizational structures of [employer] as a whole.  In carrying out its mission [employer] always keeps a focus on expansion of the network of individual [employer], to provide all recovering alcoholics and drug addicts the opportunity to develop comfortable sobriety without relapse. The Board of Directors maintains the sole right to Charter, and to revoke the Charter of, individual [employer] and exercises authority over [employer] and officers of [employer], [employer]. In this way, [employer] remains responsive to the needs of the population it serves.    Eligibility: It is preferred that Individuals complete a treatment program, depending on what treatment options are available in that area and be drug and alcohol free for 14 days or more (longer is preferred) at the time of application. They must also be willing to accept the [name] rules and expectations, and be able to pay their share of the expenses.    How to Get into an [employer]: Individuals who are interested in living in an [address]  should call the [name] of their choice to see if there are any vacancies, If there are vacancies, an interview will be scheduled. The interview with an applicant involves all members of a [name]. Eighty percent of the [name] members must vote to accept the applicant as a  roommate.   [email or web address]    [facility] [address] [place], [place], [address] [phone] phone  Offers food and emergency or transitional housing to men, women, or families in need. Clients participate in programs and workshops developed to promote self-sufficiency and personal development.Call or walk in. Applications are accepted Monday, Wednesday, and Friday by appointment only. Need photo ID and proof of income.  [facility] [address] [place], [place] [address] [phone] Population served: Adult men & women (62 years old and older, able to perform activities for daily living) Documents required: Valid ID & Social Security Card  [facility] [address] [place], [place]  [address] [phone] Population served: Families with children  [name] [address] [place], [place]  [address] [phone] Population served: Single women 18+ without dependents Documents required:  Valid ID & Social Security Card  [name] [address], [place], [place]  [address] [phone] Population served: Male veterans 18+ with substance abuse/mental health issues Eligibility: By referral only  [facility] [address], [place], [place] [address] [phone] Population served: Males 18+ Documents required: Valid ID & Social Security Card  Room at [employer] of [facility] [place], [employer] [address] [place] [address] [phone] or [phone] Population served: Pregnant women with or without children  Documents required: Valid ID & Social Security Ship broker of [employer] [address] [place], [place] [address] [phone] Population Served: Families with children  [facility] - [facility] [address] [place], [place] [address] [phone] Population  served: Men 18+, preference for disabled and/or veterans Eligibility: By referral only  [place] [address] [place], [place], [place] [address] [phone] or [phone] Population served: Families with children.

## 2024-01-26 NOTE — ED Notes (Signed)
 Patient alert & oriented x4. Patient endorses passive SI, denies intent to harm self or others when asked. Denies A/VH. Patient denies any physical complaints when asked. Scheduled medications administered with no complications. No acute distress noted. Support and encouragement provided. Routine safety checks conducted per facility protocol. Encouraged patient to notify staff if any thoughts of harm towards self or others arise. Patient verbalizes understanding and agreement.

## 2024-01-26 NOTE — ED Notes (Signed)
 Patient in hallway, calm and composed. No acute distress noted. No concerns voiced. Informed patient to notify staff with any needs or assistance. Patient verbalized understanding or agreement. Safety checks in place per facility policy.

## 2024-01-26 NOTE — ED Notes (Signed)
 Patient sitting in dayroom, calm and composed. No acute distress noted. No concerns voiced. Informed patient to notify staff with any needs or assistance. Patient verbalized understanding or agreement. Safety checks in place per facility policy.

## 2024-01-26 NOTE — ED Provider Notes (Addendum)
 FBC/OBS ASAP Discharge Summary  Date and Time: 01/26/2024 2:30 PM  Name: Cole [name]  MRN:  [id number]   Discharge Diagnoses:  Final diagnoses:  Polysubstance dependence [facility])    Subjective: per 01/24/24 HPI: 43 YO M with PTSD, chronic pain, depression, schizoaffective, anxiety, polysubstance dependence (cannabis, cocaine, opioids and benzos) who has been regularly prescribed subutex , xanax  and gabapentin  who presents with depressed mood and wanting detox. On interview, he does not want his medication chanced. I inform the patient that xanax  should not go with subutex , and that there was concern for overdose, as well as his multiple prescribers. The patient expresses understanding and is willing to continue on subutex  and slightly reduced gabapentin . He was noted this morning to be slowed and slurred of speech, but was better around lunch. He has been depressed and anxious at home. He has gone days without sleeping or eating, and estimates 5-10 pounds weight loss. He does not think he is having paranoia or delusions. He denies thoughts of harm to self or others. He is willing to consider residential placement.   Stay Summary: During the course of patient's hospitalization, the 15-minute checks were adequate to ensure patient's safety. Patient did not exhibit erratic or aggressive behavior and was compliant with scheduled medication. Patient was recommended for outpatient psychiatry follow-up.  At the time of discharge patient is not reporting any acute suicidal/homicidal ideations/AVH, delusional thoughts or paranoia. Patient did not appear to be responding to any internal stimuli. Patient feels more confident about self-care & in managing their mental health problems. Patient currently denies any new issues or concerns. Education and supportive counseling provided throughout patient's hospital stay & upon discharge.  patient was safely detoxed fom stimulants, cannabis, and benzodiazepines during  the course of his stay, and any symptoms of withdrawal were addressed and have since resolved. Previously prescribed suboxone  was continued at same dose without changes. At time of discharge the patient is not experiencing clinical syptoms of withdrawal and denies having any subjective symptoms. At time of discharge CIWA was 0.   Today upon discharge evaluation, the patient gives a mood of "alright". Patient denies any specific concerns and has no new physical complaints. Patient slept well, appetite good, regular bowel movements. Patient feels that the medications have been helpful & is in agreement to continue current treatment regimen as recommended. Patient was able to engage in safety planning including plan to return to [facility]Fac[facility]), the nearest emergency room or contact emergency services if patient feels unable to maintain their own safety or the safety of others. Patient had no further questions, comments, or concerns. Patient left [facility]) with all personal belongings in no apparent distress. Transportation per safe transport to home was arranged for patient.  Total Time spent with patient: 20 minutes  Past Psychiatric History: previously treated with gabapentin , alprazolam  and subutex .  EMR also has trazodone , depakote , risperdal  and vraylar .  Last Louann Rous was taken to the ER from [facility] due to verbal aggression towards staff.    Past Medical History: back pain, TD Family History: several with alcoholism, anxiety, depression. One cousin with completed suicide Social History: currently homeless in [place]. Applied for disability and not currently working. Single. 1 adult child.  Substance: 1/2 ppd cigarettes. Rare alcohol. Regular use of cannabis, cocaine, methamphetamines. Currently on subutex  for opioid dependence (along with gabapentin  and xanax ) Tobacco Cessation:  A prescription for an FDA-approved tobacco cessation medication provided at  discharge  Current Medications:  Current Facility-Administered Medications  Medication Dose Route Frequency Provider Last Rate Last Admin   alum & mag hydroxide-simeth (MAALOX/MYLANTA) 200-200-20 MG/5ML suspension 30 mL  30 mL Oral Q4H PRN Nkwenti, Doris, NP       buprenorphine  (SUBUTEX ) sublingual tablet 8 mg  8 mg Sublingual BID Claudette Wermuth, Alonza Arthurs, MD   8 mg at 01/26/24 1324   haloperidol  (HALDOL ) tablet 5 mg  5 mg Oral TID PRN Robet Chiquito, NP       And   diphenhydrAMINE  (BENADRYL ) capsule 50 mg  50 mg Oral TID PRN Robet Chiquito, NP       haloperidol  lactate (HALDOL ) injection 5 mg  5 mg Intramuscular TID PRN Robet Chiquito, NP       And   diphenhydrAMINE  (BENADRYL ) injection 50 mg  50 mg Intramuscular TID PRN Robet Chiquito, NP       And   LORazepam  (ATIVAN ) injection 2 mg  2 mg Intramuscular TID PRN Robet Chiquito, NP       haloperidol  lactate (HALDOL ) injection 10 mg  10 mg Intramuscular TID PRN Robet Chiquito, NP       And   diphenhydrAMINE  (BENADRYL ) injection 50 mg  50 mg Intramuscular TID PRN Robet Chiquito, NP       And   LORazepam  (ATIVAN ) injection 2 mg  2 mg Intramuscular TID PRN Robet Chiquito, NP       gabapentin  (NEURONTIN ) capsule 600 mg  600 mg Oral TID Floyce Hutching, MD   600 mg at 01/26/24 4010   hydrocortisone  (ANUSOL -HC) 2.5 % rectal cream   Rectal BID Floyce Hutching, MD   Given at 01/26/24 0911   hydrOXYzine  (ATARAX ) tablet 25 mg  25 mg Oral TID PRN Robet Chiquito, NP       LORazepam  (ATIVAN ) tablet 1 mg  1 mg Oral Q6H PRN Demaya Hardge, Alonza Arthurs, MD       magnesium  hydroxide (MILK OF MAGNESIA) suspension 30 mL  30 mL Oral Daily PRN Nkwenti, Doris, NP       mirtazapine (REMERON) tablet 7.5 mg  7.5 mg Oral QHS Amani Nodarse, Alonza Arthurs, MD   7.5 mg at 01/25/24 2112   nicotine  polacrilex (NICORETTE ) gum 2 mg  2 mg Oral PRN Floyce Hutching, MD   2 mg at 01/26/24 0951   polyethylene glycol (MIRALAX  / GLYCOLAX ) packet 17 g  17 g Oral Daily Floyce Hutching, MD   17 g at 01/26/24 2725   traZODone  (DESYREL ) tablet 50 mg  50 mg Oral QHS PRN Robet Chiquito, NP   50 mg at 01/24/24 2107   Current Outpatient Medications  Medication Sig Dispense Refill   buprenorphine  (SUBUTEX ) 8 MG SUBL SL tablet Place 8 mg under the tongue 2 (two) times daily.     gabapentin  (NEURONTIN ) 300 MG capsule Take 2 capsules (600 mg total) by mouth 3 (three) times daily. 90 capsule 1   hydrocortisone  (ANUSOL -HC) 2.5 % rectal cream Place rectally 2 (two) times daily.     mirtazapine (REMERON) 7.5 MG tablet Take 1 tablet (7.5 mg total) by mouth at bedtime. 30 tablet 0   naloxone  (NARCAN ) nasal spray 4 mg/0.1 mL SMARTSIG:1 Both Nares Daily     nicotine  polacrilex (NICORETTE ) 2 MG gum Take 1 each (2 mg total) by mouth as needed for smoking cessation. 100 tablet 0   [START ON 01/27/2024] polyethylene glycol (MIRALAX  / GLYCOLAX ) 17 g packet Take 17 g by mouth daily.     traZODone  (DESYREL ) 50 MG  tablet Take 1 tablet (50 mg total) by mouth at bedtime as needed for sleep. 30 tablet 0    PTA Medications:  Facility Ordered Medications  Medication   [COMPLETED] oxyCODONE  (Oxy IR/ROXICODONE ) immediate release tablet 10 mg   alum & mag hydroxide-simeth (MAALOX/MYLANTA) 200-200-20 MG/5ML suspension 30 mL   magnesium  hydroxide (MILK OF MAGNESIA) suspension 30 mL   hydrOXYzine  (ATARAX ) tablet 25 mg   traZODone  (DESYREL ) tablet 50 mg   haloperidol  (HALDOL ) tablet 5 mg   And   diphenhydrAMINE  (BENADRYL ) capsule 50 mg   haloperidol  lactate (HALDOL ) injection 5 mg   And   diphenhydrAMINE  (BENADRYL ) injection 50 mg   And   LORazepam  (ATIVAN ) injection 2 mg   haloperidol  lactate (HALDOL ) injection 10 mg   And   diphenhydrAMINE  (BENADRYL ) injection 50 mg   And   LORazepam  (ATIVAN ) injection 2 mg   nicotine  polacrilex (NICORETTE ) gum 2 mg   hydrocortisone  (ANUSOL -HC) 2.5 % rectal cream   gabapentin  (NEURONTIN ) capsule 600 mg   buprenorphine  (SUBUTEX ) sublingual tablet  8 mg   LORazepam  (ATIVAN ) tablet 1 mg   polyethylene glycol (MIRALAX  / GLYCOLAX ) packet 17 g   mirtazapine (REMERON) tablet 7.5 mg   PTA Medications  Medication Sig   buprenorphine  (SUBUTEX ) 8 MG SUBL SL tablet Place 8 mg under the tongue 2 (two) times daily.   naloxone  (NARCAN ) nasal spray 4 mg/0.1 mL SMARTSIG:1 Both Nares Daily   mirtazapine (REMERON) 7.5 MG tablet Take 1 tablet (7.5 mg total) by mouth at bedtime.   nicotine  polacrilex (NICORETTE ) 2 MG gum Take 1 each (2 mg total) by mouth as needed for smoking cessation.   [START ON 01/27/2024] polyethylene glycol (MIRALAX  / GLYCOLAX ) 17 g packet Take 17 g by mouth daily.   traZODone  (DESYREL ) 50 MG tablet Take 1 tablet (50 mg total) by mouth at bedtime as needed for sleep.   gabapentin  (NEURONTIN ) 300 MG capsule Take 2 capsules (600 mg total) by mouth 3 (three) times daily.   hydrocortisone  (ANUSOL -HC) 2.5 % rectal cream Place rectally 2 (two) times daily.       10/06/2023    9:54 PM 10/04/2023    4:00 PM 10/04/2023    3:58 PM  Depression screen PHQ 2/9  Decreased Interest 2 0 2  Down, Depressed, Hopeless 2 0 2  PHQ - 2 Score 4 0 4  Altered sleeping 2  2  Tired, decreased energy 2  2  Change in appetite 1  2  Feeling bad or failure about yourself  1  2  Trouble concentrating 2  2  Moving slowly or fidgety/restless 2  2  Suicidal thoughts 2  2  PHQ-9 Score 16  18    Flowsheet Row ED from 01/23/2024 in [facility] ED from 01/22/2024 in [facility] at [facility] ED from 10/10/2023 in [facility] at [facility]  C-SSRS RISK CATEGORY High Risk High Risk No Risk       Musculoskeletal  Strength & Muscle Tone: within normal limits Gait & Station: normal Patient leans: N/A  Psychiatric Specialty Exam  Presentation  General Appearance:  Appropriate for Environment; Casual  Eye Contact: Good  Speech: Normal Rate  Speech  Volume: Normal  Handedness: Right   Mood and Affect  Mood: Euthymic  Affect: Congruent   Thought Process  Thought Processes: Linear; Goal Directed  Descriptions of Associations:Intact  Orientation:Full (Time, Place and Person)  Thought Content:Logical  Diagnosis of Schizophrenia  or Schizoaffective disorder in past: Yes    Hallucinations:Hallucinations: None  Ideas of Reference:None  Suicidal Thoughts:Suicidal Thoughts: No  Homicidal Thoughts:Homicidal Thoughts: No   Sensorium  Memory: Immediate Fair; Recent Fair; Remote Fair  Judgment: Fair  Insight: Fair   Art therapist  Concentration: Fair  Attention Span: Fair  Recall: [employer] of Knowledge: Fair  Language: Fair   Psychomotor Activity  Psychomotor Activity: Psychomotor Activity: Normal   Assets  Assets: Desire for Improvement; Social Support   Sleep  Sleep: Sleep: Poor   No data recorded  Physical Exam  Physical Exam Vitals and nursing note reviewed.  Constitutional:      Appearance: Normal appearance.  HENT:     Head: Normocephalic.  Eyes:     Extraocular Movements: Extraocular movements intact.  Pulmonary:     Effort: Pulmonary effort is normal.  Musculoskeletal:        General: Normal range of motion.     Cervical back: Normal range of motion.  Neurological:     General: No focal deficit present.     Mental Status: He is alert and oriented to person, place, and time.  Psychiatric:        Mood and Affect: Mood normal.        Behavior: Behavior normal.    Review of Systems  Constitutional:  Negative for chills and fever.  Respiratory:  Negative for cough.   Cardiovascular:  Negative for chest pain.  Gastrointestinal:  Positive for constipation. Negative for diarrhea, nausea and vomiting.  Genitourinary:  Negative for dysuria.  Musculoskeletal:  Negative for myalgias.  Psychiatric/Behavioral:  Negative for hallucinations and suicidal ideas.     Blood pressure (!) 114/58, pulse (!) 56, temperature 99.2 F (37.3 C), temperature source Oral, resp. rate 16, SpO2 99%. There is no height or weight on file to calculate BMI.  Demographic Factors:  Male, Low socioeconomic status, and Unemployed  Loss Factors: NA  Historical Factors: Family history of suicide  Risk Reduction Factors:   NA  Continued Clinical Symptoms:  Alcohol/Substance Abuse/Dependencies  Cognitive Features That Contribute To Risk:  None    Suicide Risk:  Minimal: No identifiable suicidal ideation.  Patients presenting with no risk factors but with morbid ruminations; may be classified as minimal risk based on the severity of the depressive symptoms  Plan Of Care/Follow-up recommendations:  Activity:  ad lib Diet:  low sodium, low fat Prescriptions for new medications provided for the patient to bridge to follow up appointment. The patient was informed that refills for these prescriptions are generally not provided, and patient is encouraged to attend all follow up appointments to address medication refills and adjustments.   Today's discharge was reviewed with treatment team, and the team is in agreement that the patient is ready for discharge. The patient is was of the discharge plan for today and has been given opportunity to ask questions. At time of discharge, the patient does not vocalize any acute harm to self or others, is goal directed, able to advocate for self and organizational baseline.   At discharge, the patient is instructed to:  Take all medications as prescribed. Report any adverse effects and or reactions from the medicines to her outpatient provider promptly.  Do not engage in alcohol and/or illegal drug use while on prescription medicines.  In the event of worsening symptoms, patient is instructed to call the crisis hotline, 911 and or go to the nearest ED for appropriate evaluation and treatment of symptoms.  Follow-up  with primary care  provider for further care of medical issues, concerns and or health care needs. * Substance abuse follow up: it is recommended that you follow up with community support treatment, like AA/NA. It is also recommended that the patient attend 90 meetings in 90 days, otherwise known as "90 in 90"  Disposition: to home with follow up  Floyce Hutching, MD 01/26/2024, 2:30 PM

## 2024-01-26 NOTE — ED Notes (Signed)
 Patient alert and oriented x 4, observed talking to self and sometimes yelling in his room. Patient endorses auditory hallucination. Patient denies SI and HI. Will continue to monitor and maintain safety

## 2024-01-26 NOTE — ED Notes (Signed)
 Patient discharged per MD order. After Visit Summary (AVS) printed and given to patient, as well as printed prescriptions. AVS reviewed with patient. Patient non-receptive of teaching, speaking over Clinical research associate and becoming argumentative regarding prescriptions. Patient discharged in no acute distress, A& O x4 and ambulatory. Patient denied SI/HI, A/VH upon discharge. Patient belongings returned to patient from locker #13 complete and intact. Patient escorted to lobby via staff for transport to destination. Safety maintained.

## 2024-01-26 NOTE — Group Note (Signed)
 Group Topic: Wellness  Group Date: 01/26/2024 Start Time: 1130 End Time: 1155 Facilitators: Esther Hem, NT  Department: [facility]  Number of Participants: 8 (Group facilitated by [facility]) Group Focus: NUTRITION Treatment Modality:  Psychoeducation Interventions utilized were patient education Purpose: healthy eating  Name: Cole [name] Date of Birth: 11/12/1980  MR: [id number]    Level of Participation: minimal Quality of Participation: attentive Interactions with others: quiet Mood/Affect: appropriate Triggers (if applicable): n/a Cognition: coherent/clear Progress: Gaining insight Response: PT was attentive to the dietician, did not speak much, only with gestures Plan: patient will be encouraged to attend group  Patients Problems:  Patient Active Problem List   Diagnosis Date Noted   Benzodiazepine dependence (HCC) 01/24/2024   Drug-seeking behavior 01/24/2024   MDD (major depressive disorder), recurrent episode, severe (HCC) 01/23/2024   Cocaine use disorder, severe, dependence (HCC) 10/06/2023   Cannabis use disorder, severe, dependence (HCC) 10/06/2023   Tardive dyskinesia 02/24/2023   Schizoaffective disorder, bipolar type (HCC) 02/20/2023   Homelessness 12/13/2022   Polysubstance abuse (HCC) 12/13/2022   Substance induced mood disorder (HCC) 12/13/2022   Suicidal ideation 12/13/2022   Severe recurrent major depression (HCC) 07/16/2017   Major depressive disorder, recurrent episode, moderate (HCC) 08/13/2015   Opioid use disorder, severe, dependence (HCC) per history 08/13/2015   Benzodiazepine abuse (HCC) 08/13/2015   Tobacco use disorder 08/13/2015   Hepatitis C antibody test positive 01/22/2015   PTSD (post-traumatic stress disorder) 01/20/2015   Stimulant use disorder (HCC) 01/20/2015   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

## 2024-01-28 ENCOUNTER — Encounter (HOSPITAL_COMMUNITY): Payer: Self-pay

## 2024-01-28 ENCOUNTER — Other Ambulatory Visit: Payer: Self-pay

## 2024-01-28 ENCOUNTER — Emergency Department (HOSPITAL_COMMUNITY)
Admission: EM | Admit: 2024-01-28 | Discharge: 2024-01-28 | Disposition: A | Discharge status: Home / Self Care | Attending: Emergency Medicine | Admitting: Emergency Medicine

## 2024-01-28 DIAGNOSIS — Z5900 Homelessness unspecified: Secondary | ICD-10-CM | POA: Insufficient documentation

## 2024-01-28 DIAGNOSIS — F17210 Nicotine dependence, cigarettes, uncomplicated: Secondary | ICD-10-CM | POA: Diagnosis not present

## 2024-01-28 DIAGNOSIS — F32A Depression, unspecified: Secondary | ICD-10-CM | POA: Insufficient documentation

## 2024-01-28 DIAGNOSIS — F1920 Other psychoactive substance dependence, uncomplicated: Secondary | ICD-10-CM | POA: Diagnosis not present

## 2024-01-28 DIAGNOSIS — R45851 Suicidal ideations: Secondary | ICD-10-CM | POA: Diagnosis not present

## 2024-01-28 DIAGNOSIS — F1910 Other psychoactive substance abuse, uncomplicated: Secondary | ICD-10-CM | POA: Diagnosis not present

## 2024-01-28 DIAGNOSIS — J45909 Unspecified asthma, uncomplicated: Secondary | ICD-10-CM | POA: Insufficient documentation

## 2024-01-28 LAB — COMPREHENSIVE METABOLIC PANEL WITH GFR
ALT: 21 U/L (ref 0–44)
AST: 21 U/L (ref 15–41)
Albumin: 4.7 g/dL (ref 3.5–5.0)
Alkaline Phosphatase: 52 U/L (ref 38–126)
Anion gap: 11 (ref 5–15)
BUN: 16 mg/dL (ref 6–20)
CO2: 26 mmol/L (ref 22–32)
Calcium: 9.8 mg/dL (ref 8.9–10.3)
Chloride: 102 mmol/L (ref 98–111)
Creatinine, Ser: 0.87 mg/dL (ref 0.61–1.24)
GFR, Estimated: >60 mL/min (ref >60)
Glucose, Bld: 79 mg/dL (ref 70–99)
Potassium: 3.6 mmol/L (ref 3.5–5.1)
Sodium: 139 mmol/L (ref 135–145)
Total Bilirubin: 0.9 mg/dL (ref 0.0–1.2)
Total Protein: 9 g/dL — ABNORMAL HIGH (ref 6.5–8.1)

## 2024-01-28 LAB — RAPID URINE DRUG SCREEN, HOSP PERFORMED
Amphetamines: NOT DETECTED
Barbiturates: NOT DETECTED
Benzodiazepines: NOT DETECTED
Cocaine: POSITIVE — AB
Opiates: NOT DETECTED
Tetrahydrocannabinol: POSITIVE — AB

## 2024-01-28 LAB — CBC
HCT: 47.7 % (ref 39.0–52.0)
Hemoglobin: 15 g/dL (ref 13.0–17.0)
MCH: 24.5 pg — ABNORMAL LOW (ref 26.0–34.0)
MCHC: 31.4 g/dL (ref 30.0–36.0)
MCV: 78.1 fL — ABNORMAL LOW (ref 80.0–100.0)
Platelets: 178 10*3/uL (ref 150–400)
RBC: 6.11 MIL/uL — ABNORMAL HIGH (ref 4.22–5.81)
RDW: 13.8 % (ref 11.5–15.5)
WBC: 7.1 10*3/uL (ref 4.0–10.5)
nRBC: 0 % (ref 0.0–0.2)

## 2024-01-28 LAB — ETHANOL: Alcohol, Ethyl (B): <15 mg/dL (ref <15)

## 2024-01-28 LAB — SALICYLATE LEVEL: Salicylate Lvl: <7 mg/dL — ABNORMAL LOW (ref 7.0–30.0)

## 2024-01-28 LAB — ACETAMINOPHEN LEVEL: Acetaminophen (Tylenol), Serum: <10 ug/mL — ABNORMAL LOW (ref 10–30)

## 2024-01-28 MED ORDER — GABAPENTIN 300 MG PO CAPS
600.0000 mg | ORAL_CAPSULE | Freq: Three times a day (TID) | ORAL | Status: DC
  Administered 2024-01-28 (×2): 600 mg via ORAL
  Filled 2024-01-28 (×2): qty 2

## 2024-01-28 MED ORDER — MIRTAZAPINE 15 MG PO TBDP
7.5000 mg | ORAL_TABLET | Freq: Every day | ORAL | Status: DC
  Filled 2024-01-28: qty 0.5

## 2024-01-28 MED ORDER — BUPRENORPHINE HCL 2 MG SL SUBL
8.0000 mg | SUBLINGUAL_TABLET | Freq: Two times a day (BID) | SUBLINGUAL | Status: DC
  Administered 2024-01-28: 8 mg via SUBLINGUAL
  Filled 2024-01-28: qty 4

## 2024-01-28 MED ORDER — TRAZODONE HCL 50 MG PO TABS
50.0000 mg | ORAL_TABLET | Freq: Every day | ORAL | Status: DC
  Administered 2024-01-28: 50 mg via ORAL
  Filled 2024-01-28: qty 1

## 2024-01-28 NOTE — ED Notes (Signed)
 Pt/family received d/c paperwork at this time. After going over the paperwork any questions, comments, or concerns were answered to the best of this nurse's knowledge. The pt/family verbally acknowledged the teachings/instructions.    Pt's belongings were returned to the pt. One bag total was found

## 2024-01-28 NOTE — ED Notes (Addendum)
 Bilateral ear rings taken from pt and placed in a bag and put into locker 5.

## 2024-01-28 NOTE — ED Provider Notes (Signed)
 [place] EMERGENCY DEPARTMENT AT [facility] Provider Note   CSN: [id number] Arrival date & time: 01/28/24  1544     History  Chief Complaint  Patient presents with   Suicidal    Cole [name] is a 43 y.o. male.  Patient is a 43 year old male who presents to the emergency department stating that he has had some increased suicidal thoughts with no active plan.  He notes that he has been compliant with all of his home medications.  He is currently homeless at this time.  He does note that he has already an appointment arranged on Monday to help with his living situation.  Patient denies any history of suicide attempts.  He notes he has not done anything to harm himself.  He denies any hallucinations or delusions.  He denies any homicidal ideation.  He has no systemic complaints at this time to include fever, chills, chest pain, shortness of breath, abdominal pain, nausea, vomiting, diarrhea.        Home Medications Prior to Admission medications   Medication Sig Start Date End Date Taking? Authorizing Provider  buprenorphine  (SUBUTEX ) 8 MG SUBL SL tablet Place 8 mg under the tongue 2 (two) times daily. 01/09/24   [provider]  gabapentin  (NEURONTIN ) 300 MG capsule Take 2 capsules (600 mg total) by mouth 3 (three) times daily. Patient not taking: Reported on 01/28/2024 01/26/24   Floyce Hutching, MD  gabapentin  (NEURONTIN ) 800 MG tablet Take 800 mg by mouth 3 (three) times daily. 01/26/24   [provider]  hydrocortisone  (ANUSOL -HC) 2.5 % rectal cream Place rectally 2 (two) times daily. 01/26/24   Floyce Hutching, MD  mirtazapine  (REMERON ) 7.5 MG tablet Take 1 tablet (7.5 mg total) by mouth at bedtime. 01/26/24   Floyce Hutching, MD  naloxone  (NARCAN ) nasal spray 4 mg/0.1 mL SMARTSIG:1 Both Nares Daily 01/09/24   [provider]  nicotine  polacrilex (NICORETTE ) 2 MG gum Take 1 each (2 mg total) by mouth as needed for smoking  cessation. 01/26/24   Floyce Hutching, MD  polyethylene glycol (MIRALAX  / GLYCOLAX ) 17 g packet Take 17 g by mouth daily. 01/27/24   Floyce Hutching, MD  traZODone  (DESYREL ) 50 MG tablet Take 1 tablet (50 mg total) by mouth at bedtime as needed for sleep. 01/26/24   Floyce Hutching, MD      Allergies    Aleve  [naproxen  sodium], Aspirin, Ibuprofen , Seroquel [quetiapine fumerate], Tramadol , and Tylenol  [acetaminophen ]    Review of Systems   Review of Systems  Psychiatric/Behavioral:  Positive for suicidal ideas.     Physical Exam Updated Vital Signs BP 131/89 (BP Location: Right Arm)   Pulse 80   Temp 98 F (36.7 C)   Resp 18   Ht 5\' 9"  (1.753 m)   Wt 83.9 kg   SpO2 99%   BMI 27.32 kg/m  Physical Exam Vitals and nursing note reviewed.  Constitutional:      Appearance: Normal appearance.  HENT:     Head: Normocephalic and atraumatic.     Nose: Nose normal.     Mouth/Throat:     Mouth: Mucous membranes are moist.  Eyes:     Extraocular Movements: Extraocular movements intact.     Conjunctiva/sclera: Conjunctivae normal.     Pupils: Pupils are equal, round, and reactive to light.  Cardiovascular:     Rate and Rhythm: Normal rate and regular rhythm.     Pulses: Normal pulses.  Heart sounds: Normal heart sounds. No murmur heard.    No gallop.  Pulmonary:     Effort: Pulmonary effort is normal. No respiratory distress.     Breath sounds: Normal breath sounds. No stridor. No wheezing, rhonchi or rales.  Abdominal:     General: Abdomen is flat. Bowel sounds are normal.     Palpations: Abdomen is soft.  Musculoskeletal:        General: Normal range of motion.     Cervical back: Normal range of motion and neck supple.  Skin:    General: Skin is warm and dry.  Neurological:     General: No focal deficit present.     Mental Status: He is alert and oriented to person, place, and time. Mental status is at baseline.     Cranial Nerves: No cranial nerve  deficit.     Sensory: No sensory deficit.     Motor: No weakness.     Coordination: Coordination normal.     Gait: Gait normal.  Psychiatric:        Attention and Perception: He is attentive. He does not perceive auditory or visual hallucinations.        Mood and Affect: Affect is blunt and flat. Affect is not angry or inappropriate.        Speech: He is communicative. Speech is not rapid and pressured, delayed, slurred or tangential.        Behavior: Behavior is not agitated, slowed, aggressive, withdrawn, hyperactive or combative.        Thought Content: Thought content is not paranoid or delusional. Thought content includes suicidal ideation. Thought content does not include homicidal ideation. Thought content does not include homicidal or suicidal plan.        Cognition and Memory: Cognition is not impaired. Memory is not impaired. He does not exhibit impaired recent memory or impaired remote memory.        Judgment: Judgment is not impulsive or inappropriate.     ED Results / Procedures / Treatments   Labs (all labs ordered are listed, but only abnormal results are displayed) Labs Reviewed  COMPREHENSIVE METABOLIC PANEL WITH GFR - Abnormal; Notable for the following components:      Result Value   Total Protein 9.0 (*)    All other components within normal limits  CBC - Abnormal; Notable for the following components:   RBC 6.11 (*)    MCV 78.1 (*)    MCH 24.5 (*)    All other components within normal limits  RAPID URINE DRUG SCREEN, HOSP PERFORMED - Abnormal; Notable for the following components:   Cocaine POSITIVE (*)    Tetrahydrocannabinol POSITIVE (*)    All other components within normal limits  SALICYLATE LEVEL - Abnormal; Notable for the following components:   Salicylate Lvl <7.0 (*)    All other components within normal limits  ACETAMINOPHEN  LEVEL - Abnormal; Notable for the following components:   Acetaminophen  (Tylenol ), Serum <10 (*)    All other components  within normal limits  ETHANOL    EKG None  Radiology No results found.  Procedures Procedures    Medications Ordered in ED Medications  buprenorphine  (SUBUTEX ) SL tablet 8 mg (has no administration in time range)  gabapentin  (NEURONTIN ) capsule 600 mg (600 mg Oral Given 01/28/24 1635)  mirtazapine  (REMERON  SOL-TAB) disintegrating tablet 7.5 mg (has no administration in time range)  traZODone  (DESYREL ) tablet 50 mg (has no administration in time range)    ED Course/ Medical  Decision Making/ A&P                                 Medical Decision Making Patient does remain stable at this time.  Blood work has been unremarkable in the emergency department.  He was evaluated by psychiatry who deemed that patient is stable for discharge home.  He has moderate risk at this time but his current statements do seem to be for secondary gain as he is currently homeless.  He already has close outpatient follow-up scheduled and he will continue to keep this.  He has no active plan for suicide at this point and has not done anything to harm himself.  He has no history of previous suicide attempts.  Psychiatrist does not believe that admission is warranted at this time.  Patient will be discharged at this point with close outpatient follow-up.  Amount and/or Complexity of Data Reviewed Labs: ordered.  Risk Prescription drug management.           Final Clinical Impression(s) / ED Diagnoses Final diagnoses:  None    Rx / DC Orders ED Discharge Orders     None         Emmalene Hare 01/28/24 2245    Cheyenne Cotta, MD 01/29/24 1208

## 2024-01-28 NOTE — ED Provider Notes (Signed)
 Patient is medically cleared for TTS evaluation at this time.   Roselynn Connors, PA-C 01/28/24 1854    Cheyenne Cotta, MD 01/29/24 1208

## 2024-01-28 NOTE — ED Notes (Signed)
 Pt verbalized he would cut his wrists with a knife.

## 2024-01-28 NOTE — Discharge Instructions (Addendum)
 Please follow-up closely with psychiatry on an outpatient basis.  Return to emergency department immediately for any new or worsening symptoms.

## 2024-01-28 NOTE — ED Triage Notes (Signed)
 Pt reports he is homeless and has an appointment on Monday in [place] [facility] for [facility] and finding a home.  Pt reports his situation is what makes him feel suicidal.
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
## 2024-01-28 NOTE — ED Notes (Signed)
 2053 - Round check is complete at this time. Pt denies needing anything like food, drink or bathroom. Pt has no s/s of distress. Pt was moved to a different location in the ED. Pt is now under this nurses care.

## 2024-01-28 NOTE — BH Assessment (Signed)
 Consult referred to [facility]IS at 1735 this writer spoke to Guthrie at the call center who is assisting with coordination.

## 2024-02-03 ENCOUNTER — Encounter (HOSPITAL_COMMUNITY): Payer: Self-pay | Admitting: Emergency Medicine

## 2024-02-03 ENCOUNTER — Emergency Department (HOSPITAL_COMMUNITY)
Admission: EM | Admit: 2024-02-03 | Discharge: 2024-02-04 | Disposition: A | Discharge status: Home / Self Care | Attending: Emergency Medicine | Admitting: Emergency Medicine

## 2024-02-03 ENCOUNTER — Other Ambulatory Visit: Payer: Self-pay

## 2024-02-03 DIAGNOSIS — F1994 Other psychoactive substance use, unspecified with psychoactive substance-induced mood disorder: Secondary | ICD-10-CM

## 2024-02-03 DIAGNOSIS — F1924 Other psychoactive substance dependence with psychoactive substance-induced mood disorder: Secondary | ICD-10-CM | POA: Diagnosis not present

## 2024-02-03 DIAGNOSIS — Z59811 Housing instability, housed, with risk of homelessness: Secondary | ICD-10-CM | POA: Insufficient documentation

## 2024-02-03 DIAGNOSIS — Z59819 Housing instability, housed unspecified: Secondary | ICD-10-CM

## 2024-02-03 DIAGNOSIS — F1410 Cocaine abuse, uncomplicated: Secondary | ICD-10-CM | POA: Diagnosis present

## 2024-02-03 LAB — CBC
HCT: 40.5 % (ref 39.0–52.0)
Hemoglobin: 12.8 g/dL — ABNORMAL LOW (ref 13.0–17.0)
MCH: 24.4 pg — ABNORMAL LOW (ref 26.0–34.0)
MCHC: 31.6 g/dL (ref 30.0–36.0)
MCV: 77.3 fL — ABNORMAL LOW (ref 80.0–100.0)
Platelets: 149 10*3/uL — ABNORMAL LOW (ref 150–400)
RBC: 5.24 MIL/uL (ref 4.22–5.81)
RDW: 13.6 % (ref 11.5–15.5)
WBC: 6.9 10*3/uL (ref 4.0–10.5)
nRBC: 0 % (ref 0.0–0.2)

## 2024-02-03 NOTE — ED Triage Notes (Signed)
 Pt here for "suicidal thoughts". States he is thinking about cutting his wrists. Pt seen here recently for same.

## 2024-02-03 NOTE — ED Notes (Signed)
 Pt wanded by security.

## 2024-02-04 LAB — RAPID URINE DRUG SCREEN, HOSP PERFORMED
Amphetamines: NOT DETECTED
Barbiturates: NOT DETECTED
Benzodiazepines: NOT DETECTED
Cocaine: POSITIVE — AB
Opiates: NOT DETECTED
Tetrahydrocannabinol: POSITIVE — AB

## 2024-02-04 LAB — COMPREHENSIVE METABOLIC PANEL WITH GFR
ALT: 17 U/L (ref 0–44)
AST: 13 U/L — ABNORMAL LOW (ref 15–41)
Albumin: 3.7 g/dL (ref 3.5–5.0)
Alkaline Phosphatase: 38 U/L (ref 38–126)
Anion gap: 6 (ref 5–15)
BUN: 10 mg/dL (ref 6–20)
CO2: 25 mmol/L (ref 22–32)
Calcium: 8.8 mg/dL — ABNORMAL LOW (ref 8.9–10.3)
Chloride: 106 mmol/L (ref 98–111)
Creatinine, Ser: 0.79 mg/dL (ref 0.61–1.24)
GFR, Estimated: >60 mL/min (ref >60)
Glucose, Bld: 84 mg/dL (ref 70–99)
Potassium: 3.3 mmol/L — ABNORMAL LOW (ref 3.5–5.1)
Sodium: 137 mmol/L (ref 135–145)
Total Bilirubin: 0.5 mg/dL (ref 0.0–1.2)
Total Protein: 7.3 g/dL (ref 6.5–8.1)

## 2024-02-04 LAB — ETHANOL: Alcohol, Ethyl (B): 48 mg/dL — ABNORMAL HIGH (ref <15)

## 2024-02-04 MED ORDER — METHOCARBAMOL 500 MG PO TABS
750.0000 mg | ORAL_TABLET | Freq: Once | ORAL | Status: AC
  Administered 2024-02-04: 750 mg via ORAL
  Filled 2024-02-04: qty 2

## 2024-02-04 MED ORDER — HYDROXYZINE HCL 25 MG PO TABS
25.0000 mg | ORAL_TABLET | Freq: Once | ORAL | Status: AC
  Administered 2024-02-04: 25 mg via ORAL
  Filled 2024-02-04: qty 1

## 2024-02-04 MED ORDER — BUPRENORPHINE HCL-NALOXONE HCL 8-2 MG SL SUBL
1.0000 | SUBLINGUAL_TABLET | Freq: Every day | SUBLINGUAL | Status: DC
  Administered 2024-02-04: 1 via SUBLINGUAL
  Filled 2024-02-04: qty 1

## 2024-02-04 MED ORDER — GABAPENTIN 400 MG PO CAPS
800.0000 mg | ORAL_CAPSULE | Freq: Three times a day (TID) | ORAL | Status: DC
  Administered 2024-02-04: 800 mg via ORAL
  Filled 2024-02-04: qty 2

## 2024-02-04 MED ORDER — NICOTINE 21 MG/24HR TD PT24
21.0000 mg | MEDICATED_PATCH | Freq: Once | TRANSDERMAL | Status: DC
  Administered 2024-02-04: 21 mg via TRANSDERMAL
  Filled 2024-02-04: qty 1

## 2024-02-04 NOTE — ED Notes (Signed)
 Patient wanded by security.

## 2024-02-04 NOTE — ED Provider Notes (Signed)
 [place] EMERGENCY DEPARTMENT AT [facility] Provider Note   CSN: [id number] Arrival date & time: 02/03/24  2320     History  Chief Complaint  Patient presents with   V70.1    Cole [name] is a 43 y.o. male.  Patient w/ h/o psychiatric issues, drug abuse and malingering. Here with SI w/ plan to cut wrists. No medical complaints.         Home Medications Prior to Admission medications   Medication Sig Start Date End Date Taking? Authorizing Provider  buprenorphine  (SUBUTEX ) 8 MG SUBL SL tablet Place 8 mg under the tongue 2 (two) times daily. 01/09/24   [provider]  gabapentin  (NEURONTIN ) 800 MG tablet Take 800 mg by mouth 3 (three) times daily. 01/26/24   [provider]  hydrocortisone  (ANUSOL -HC) 2.5 % rectal cream Place rectally 2 (two) times daily. 01/26/24   Floyce Hutching, MD  LORazepam  (ATIVAN ) 1 MG tablet Take 1 mg by mouth every 8 (eight) hours as needed for anxiety.    [provider]  mirtazapine  (REMERON ) 7.5 MG tablet Take 1 tablet (7.5 mg total) by mouth at bedtime. 01/26/24   Floyce Hutching, MD  naloxone  (NARCAN ) nasal spray 4 mg/0.1 mL SMARTSIG:1 Both Nares Daily 01/09/24   [provider]  nicotine  polacrilex (NICORETTE ) 2 MG gum Take 1 each (2 mg total) by mouth as needed for smoking cessation. 01/26/24   Floyce Hutching, MD  polyethylene glycol (MIRALAX  / GLYCOLAX ) 17 g packet Take 17 g by mouth daily. 01/27/24   Floyce Hutching, MD  traZODone  (DESYREL ) 50 MG tablet Take 1 tablet (50 mg total) by mouth at bedtime as needed for sleep. 01/26/24   Floyce Hutching, MD      Allergies    Aleve  [naproxen  sodium], Aspirin, Ibuprofen , Seroquel [quetiapine fumerate], Tramadol , and Tylenol  [acetaminophen ]    Review of Systems   Review of Systems  Physical Exam Updated Vital Signs BP 124/79 (BP Location: Right Arm)   Pulse 88   Temp 98.5 F (36.9 C) (Oral)   Resp 18   Ht 5\' 9"   (1.753 m)   Wt 84 kg   SpO2 100%   BMI 27.35 kg/m  Physical Exam Vitals and nursing note reviewed.  Constitutional:      Appearance: He is well-developed.  HENT:     Head: Normocephalic and atraumatic.  Cardiovascular:     Rate and Rhythm: Normal rate.  Pulmonary:     Effort: Pulmonary effort is normal. No respiratory distress.  Abdominal:     General: There is no distension.  Musculoskeletal:        General: Normal range of motion.     Cervical back: Normal range of motion.  Neurological:     Mental Status: He is alert.     ED Results / Procedures / Treatments   Labs (all labs ordered are listed, but only abnormal results are displayed) Labs Reviewed  COMPREHENSIVE METABOLIC PANEL WITH GFR - Abnormal; Notable for the following components:      Result Value   Potassium 3.3 (*)    Calcium 8.8 (*)    AST 13 (*)    All other components within normal limits  ETHANOL - Abnormal; Notable for the following components:   Alcohol, Ethyl (B) 48 (*)    All other components within normal limits  CBC - Abnormal; Notable for the following components:   Hemoglobin 12.8 (*)    MCV 77.3 (*)  MCH 24.4 (*)    Platelets 149 (*)    All other components within normal limits  RAPID URINE DRUG SCREEN, HOSP PERFORMED - Abnormal; Notable for the following components:   Cocaine POSITIVE (*)    Tetrahydrocannabinol POSITIVE (*)    All other components within normal limits    EKG None  Radiology No results found.  Procedures Procedures    Medications Ordered in ED Medications - No data to display  ED Course/ Medical Decision Making/ A&P                                 Medical Decision Making Amount and/or Complexity of Data Reviewed Labs: ordered.   H/o malingering however I haven't met him previously and he states current SI w/ plan so will consult TTS. Patient medically clear for TTS evaluation and recommendations.   Final Clinical Impression(s) / ED Diagnoses Final  diagnoses:  None    Rx / DC Orders ED Discharge Orders     None         Trana Ressler, Reymundo Caulk, MD 02/04/24 [phone]

## 2024-02-04 NOTE — Discharge Instructions (Signed)
 It was our pleasure to provide your ER care today - we hope that you feel better.  Avoid cocaine/drug use as it is harmful to your physical health and mental well-being. See resource guide attached in terms of accessing inpatient or outpatient substance use treatment programs.   Follow up closely with primary care doctor and behavioral health provider in the coming week.  For mental health issues and/or crisis, you may also go directly to the [facility] - they are open 24/7 and walk-ins are welcome.  See additional resource guides provided in terms of additional behavior health and/or social service resources in the community.   Return to ER if worse, new symptoms, fevers, chest pain, trouble breathing, or other emergency concern.

## 2024-02-04 NOTE — ED Provider Notes (Addendum)
 Emergency Medicine Observation Re-evaluation Note  Cole [name] is a 43 y.o. male, seen on rounds today.  Pt initially presented to the ED for complaints of housing instability.  Pt indicates he had been staying at shelter in [place], but they will only let you stay for 30 days. Pt indicates has some family in [place], but cannot stay there either/previously 'kicked out'. Indicates currently homeless. Also w hx SUD/cocaine abuse. Indicates at times feels down/depressed about not having regular place to stay. Denies desire or plan to harm self. Is asking for something for low back pain/spasm - no radicular pain, no recent injury.   Physical Exam  BP 102/86 (BP Location: Right Arm)   Pulse (!) 56   Temp 98.6 F (37 C) (Oral)   Resp 18   Ht 1.753 m (5\' 9" )   Wt 84 kg   SpO2 98%   BMI 27.35 kg/m  Physical Exam General: calm, no acute distress.  Cardiac: regular rate.  Lungs: breathing comfortably. Psych: conversant, normal mood and affect - does not appear acutely depressed or despondent. Pt notes stress related to housing instability, and indicates main goal is finding at place to stay. Denies plan or desire to harm self. Pt does not appear to be responding to internal stimuli. No delusions or hallucinations noted.   ED Course / MDM    I have reviewed the labs performed to date as well as medications administered while in observation.  Recent changes in the last 24 hours include ED obs, reassessment.   Plan    Robaxin  dose po.   Pt with normal appetite/eating/drinking. Appears in no acute distress. Indicates main issue is housing instability. Pt references having a f/u appt with a unnamed BH provider in [place] 'in about a week'.  No plan/desire to harm self. No acute psychosis noted.   Pt currently appears stable for ED d/c.   Will provide resources re social services, shelters, as well as additional outpatient bh resources.       Guadalupe Lee, MD 02/04/24  1007

## 2024-02-04 NOTE — ED Notes (Signed)
 Patient dressed out in paper scrubs and belongings were placed in locker 4.

## 2024-02-04 NOTE — BH Assessment (Signed)
 Patient was deferred to [facility] for a telepsych assessment. The assigned care coordinator will provide updates regarding the scheduling of the assessment. IRIS coordinator can be reached at [phone] for further information on the timing of the telepsych evaluation.

## 2024-02-04 NOTE — ED Notes (Signed)
 Patient ok to discharge see discontinuation of TTS orders at 10:09.

## 2024-02-08 ENCOUNTER — Emergency Department (HOSPITAL_COMMUNITY)
Admission: EM | Admit: 2024-02-08 | Discharge: 2024-02-08 | Discharge status: Against Medical Advice | Attending: Emergency Medicine | Admitting: Emergency Medicine

## 2024-02-08 DIAGNOSIS — F419 Anxiety disorder, unspecified: Secondary | ICD-10-CM | POA: Insufficient documentation

## 2024-02-08 DIAGNOSIS — R0600 Dyspnea, unspecified: Secondary | ICD-10-CM | POA: Insufficient documentation

## 2024-02-08 DIAGNOSIS — Z5321 Procedure and treatment not carried out due to patient leaving prior to being seen by health care provider: Secondary | ICD-10-CM | POA: Insufficient documentation

## 2024-02-08 NOTE — ED Notes (Signed)
 Pt says his friend is bringing him an inhaler and he doesn't want to be seen now Pt left before triage complete- ambulatory NAD

## 2024-02-08 NOTE — ED Triage Notes (Signed)
 Pt bib EMS for anxiety and difficulty breathing. Pt was walking down [address] when picked up by EMS. Pt says he needs an inhaler. Pt says breathing is better.

## 2024-02-19 ENCOUNTER — Emergency Department (HOSPITAL_COMMUNITY)
Admission: EM | Admit: 2024-02-19 | Discharge: 2024-02-20 | Disposition: A | Discharge status: Home / Self Care | Attending: Emergency Medicine | Admitting: Emergency Medicine

## 2024-02-19 ENCOUNTER — Encounter (HOSPITAL_COMMUNITY): Payer: Self-pay

## 2024-02-19 ENCOUNTER — Other Ambulatory Visit: Payer: Self-pay

## 2024-02-19 DIAGNOSIS — R112 Nausea with vomiting, unspecified: Secondary | ICD-10-CM

## 2024-02-19 DIAGNOSIS — J45909 Unspecified asthma, uncomplicated: Secondary | ICD-10-CM | POA: Diagnosis not present

## 2024-02-19 DIAGNOSIS — F17210 Nicotine dependence, cigarettes, uncomplicated: Secondary | ICD-10-CM | POA: Diagnosis not present

## 2024-02-19 DIAGNOSIS — Z5900 Homelessness unspecified: Secondary | ICD-10-CM | POA: Insufficient documentation

## 2024-02-19 DIAGNOSIS — E860 Dehydration: Secondary | ICD-10-CM | POA: Insufficient documentation

## 2024-02-19 DIAGNOSIS — N179 Acute kidney failure, unspecified: Secondary | ICD-10-CM | POA: Diagnosis not present

## 2024-02-19 MED ORDER — DIPHENHYDRAMINE HCL 50 MG/ML IJ SOLN
25.0000 mg | Freq: Once | INTRAMUSCULAR | Status: AC
  Administered 2024-02-20: 25 mg via INTRAVENOUS
  Filled 2024-02-19: qty 1

## 2024-02-19 MED ORDER — SODIUM CHLORIDE 0.9 % IV BOLUS
1000.0000 mL | Freq: Once | INTRAVENOUS | Status: AC
  Administered 2024-02-20: 1000 mL via INTRAVENOUS

## 2024-02-19 MED ORDER — PROCHLORPERAZINE EDISYLATE 10 MG/2ML IJ SOLN
10.0000 mg | Freq: Once | INTRAMUSCULAR | Status: AC
  Administered 2024-02-20: 10 mg via INTRAVENOUS
  Filled 2024-02-19: qty 2

## 2024-02-19 NOTE — ED Triage Notes (Signed)
 Patient from home for vomiting that started earlier tonight. Also reports lower abd pain, 6/10. Denies constipation, diarrhea, or urinary changes. Upon arrival to ER, patient is alert and oriented, ambu

## 2024-02-20 LAB — CBC
HCT: 51.7 % (ref 39.0–52.0)
Hemoglobin: 16.6 g/dL (ref 13.0–17.0)
MCH: 25.3 pg — ABNORMAL LOW (ref 26.0–34.0)
MCHC: 32.1 g/dL (ref 30.0–36.0)
MCV: 78.8 fL — ABNORMAL LOW (ref 80.0–100.0)
Platelets: 157 10*3/uL (ref 150–400)
RBC: 6.56 MIL/uL — ABNORMAL HIGH (ref 4.22–5.81)
RDW: 14.5 % (ref 11.5–15.5)
WBC: 7 10*3/uL (ref 4.0–10.5)
nRBC: 0 % (ref 0.0–0.2)

## 2024-02-20 LAB — COMPREHENSIVE METABOLIC PANEL WITH GFR
ALT: 28 U/L (ref 0–44)
AST: 35 U/L (ref 15–41)
Albumin: 5.4 g/dL — ABNORMAL HIGH (ref 3.5–5.0)
Alkaline Phosphatase: 57 U/L (ref 38–126)
Anion gap: 15 (ref 5–15)
BUN: 25 mg/dL — ABNORMAL HIGH (ref 6–20)
CO2: 27 mmol/L (ref 22–32)
Calcium: 9.9 mg/dL (ref 8.9–10.3)
Chloride: 97 mmol/L — ABNORMAL LOW (ref 98–111)
Creatinine, Ser: 1.77 mg/dL — ABNORMAL HIGH (ref 0.61–1.24)
GFR, Estimated: 49 mL/min — ABNORMAL LOW (ref >60)
Glucose, Bld: 102 mg/dL — ABNORMAL HIGH (ref 70–99)
Potassium: 4.1 mmol/L (ref 3.5–5.1)
Sodium: 139 mmol/L (ref 135–145)
Total Bilirubin: 1.4 mg/dL — ABNORMAL HIGH (ref 0.0–1.2)
Total Protein: 10 g/dL — ABNORMAL HIGH (ref 6.5–8.1)

## 2024-02-20 LAB — ETHANOL: Alcohol, Ethyl (B): <15 mg/dL (ref <15)

## 2024-02-20 LAB — LIPASE, BLOOD: Lipase: 28 U/L (ref 11–51)

## 2024-02-20 MED ORDER — ONDANSETRON 4 MG PO TBDP: 4.0000 mg | ORAL_TABLET | Freq: Three times a day (TID) | ORAL | 0 refills | Status: DC | PRN
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
## 2024-02-20 NOTE — Discharge Instructions (Signed)
 You were evaluated in the Emergency Department and after careful evaluation, we did not find any emergent condition requiring admission or further testing in the hospital.  Your exam/testing today is overall reassuring.  Symptoms may be due to gastritis or viral illness.  The vomiting caused dehydration and some irritation to your kidneys.  Use the Zofran  medication as needed for nausea.  Plenty of fluids and rest.  Follow-up with your primary care doctor for lab recheck.  Please return to the Emergency Department if you experience any worsening of your condition.   Thank you for allowing us  to be a part of your care.

## 2024-03-13 ENCOUNTER — Emergency Department (HOSPITAL_COMMUNITY)
Admission: EM | Admit: 2024-03-13 | Discharge: 2024-03-14 | Disposition: A | Discharge status: Other Acute Inpatient Hospital

## 2024-03-13 ENCOUNTER — Other Ambulatory Visit: Payer: Self-pay

## 2024-03-13 ENCOUNTER — Encounter (HOSPITAL_COMMUNITY): Payer: Self-pay

## 2024-03-13 DIAGNOSIS — Z79899 Other long term (current) drug therapy: Secondary | ICD-10-CM | POA: Diagnosis not present

## 2024-03-13 DIAGNOSIS — R45851 Suicidal ideations: Secondary | ICD-10-CM | POA: Insufficient documentation

## 2024-03-13 DIAGNOSIS — F1190 Opioid use, unspecified, uncomplicated: Secondary | ICD-10-CM | POA: Diagnosis not present

## 2024-03-13 DIAGNOSIS — Z5900 Homelessness unspecified: Secondary | ICD-10-CM | POA: Diagnosis not present

## 2024-03-13 DIAGNOSIS — F32A Depression, unspecified: Secondary | ICD-10-CM | POA: Diagnosis present

## 2024-03-13 DIAGNOSIS — F331 Major depressive disorder, recurrent, moderate: Secondary | ICD-10-CM | POA: Diagnosis not present

## 2024-03-13 DIAGNOSIS — F419 Anxiety disorder, unspecified: Secondary | ICD-10-CM | POA: Insufficient documentation

## 2024-03-13 LAB — CBC WITH DIFFERENTIAL/PLATELET
Abs Immature Granulocytes: 0.01 K/uL (ref 0.00–0.07)
Basophils Absolute: 0 K/uL (ref 0.0–0.1)
Basophils Relative: 0 %
Eosinophils Absolute: 0 K/uL (ref 0.0–0.5)
Eosinophils Relative: 1 %
HCT: 39.1 % (ref 39.0–52.0)
Hemoglobin: 12.3 g/dL — ABNORMAL LOW (ref 13.0–17.0)
Immature Granulocytes: 0 %
Lymphocytes Relative: 34 %
Lymphs Abs: 1.6 K/uL (ref 0.7–4.0)
MCH: 24.7 pg — ABNORMAL LOW (ref 26.0–34.0)
MCHC: 31.5 g/dL (ref 30.0–36.0)
MCV: 78.5 fL — ABNORMAL LOW (ref 80.0–100.0)
Monocytes Absolute: 0.5 K/uL (ref 0.1–1.0)
Monocytes Relative: 10 %
Neutro Abs: 2.6 K/uL (ref 1.7–7.7)
Neutrophils Relative %: 55 %
Platelets: 149 K/uL — ABNORMAL LOW (ref 150–400)
RBC: 4.98 MIL/uL (ref 4.22–5.81)
RDW: 14.5 % (ref 11.5–15.5)
WBC: 4.7 K/uL (ref 4.0–10.5)
nRBC: 0 % (ref 0.0–0.2)

## 2024-03-13 LAB — COMPREHENSIVE METABOLIC PANEL WITH GFR
ALT: 20 U/L (ref 0–44)
AST: 13 U/L — ABNORMAL LOW (ref 15–41)
Albumin: 3.8 g/dL (ref 3.5–5.0)
Alkaline Phosphatase: 43 U/L (ref 38–126)
Anion gap: 10 (ref 5–15)
BUN: 10 mg/dL (ref 6–20)
CO2: 27 mmol/L (ref 22–32)
Calcium: 8.5 mg/dL — ABNORMAL LOW (ref 8.9–10.3)
Chloride: 105 mmol/L (ref 98–111)
Creatinine, Ser: 0.91 mg/dL (ref 0.61–1.24)
GFR, Estimated: >60 mL/min (ref >60)
Glucose, Bld: 90 mg/dL (ref 70–99)
Potassium: 3.4 mmol/L — ABNORMAL LOW (ref 3.5–5.1)
Sodium: 142 mmol/L (ref 135–145)
Total Bilirubin: 1 mg/dL (ref 0.0–1.2)
Total Protein: 7.1 g/dL (ref 6.5–8.1)

## 2024-03-13 LAB — RAPID URINE DRUG SCREEN, HOSP PERFORMED
Amphetamines: NOT DETECTED
Barbiturates: NOT DETECTED
Benzodiazepines: POSITIVE — AB
Cocaine: POSITIVE — AB
Opiates: NOT DETECTED
Tetrahydrocannabinol: NOT DETECTED

## 2024-03-13 LAB — SALICYLATE LEVEL: Salicylate Lvl: <7 mg/dL — ABNORMAL LOW (ref 7.0–30.0)

## 2024-03-13 LAB — ACETAMINOPHEN LEVEL: Acetaminophen (Tylenol), Serum: <10 ug/mL — ABNORMAL LOW (ref 10–30)

## 2024-03-13 LAB — ETHANOL: Alcohol, Ethyl (B): <15 mg/dL (ref <15)

## 2024-03-13 MED ORDER — BUPRENORPHINE HCL 2 MG SL SUBL
8.0000 mg | SUBLINGUAL_TABLET | Freq: Two times a day (BID) | SUBLINGUAL | Status: DC
  Administered 2024-03-14: 8 mg via SUBLINGUAL
  Filled 2024-03-13: qty 4

## 2024-03-13 MED ORDER — LORAZEPAM 1 MG PO TABS
1.0000 mg | ORAL_TABLET | Freq: Three times a day (TID) | ORAL | Status: DC | PRN
  Administered 2024-03-14: 1 mg via ORAL
  Filled 2024-03-13: qty 1

## 2024-03-13 MED ORDER — ALUM & MAG HYDROXIDE-SIMETH 200-200-20 MG/5ML PO SUSP: 30.0000 mL | Freq: Four times a day (QID) | ORAL | Status: DC | PRN

## 2024-03-13 MED ORDER — CARIPRAZINE HCL 1.5 MG PO CAPS
3.0000 mg | ORAL_CAPSULE | Freq: Every day | ORAL | Status: DC
  Administered 2024-03-14: 3 mg via ORAL
  Filled 2024-03-13 (×2): qty 2

## 2024-03-13 MED ORDER — METHOCARBAMOL 500 MG PO TABS: 500.0000 mg | ORAL_TABLET | Freq: Four times a day (QID) | ORAL | Status: DC | PRN

## 2024-03-13 MED ORDER — GABAPENTIN 300 MG PO CAPS
600.0000 mg | ORAL_CAPSULE | Freq: Three times a day (TID) | ORAL | Status: DC
  Administered 2024-03-13 – 2024-03-14 (×2): 600 mg via ORAL
  Filled 2024-03-13 (×3): qty 2

## 2024-03-13 MED ORDER — BUPRENORPHINE HCL 2 MG SL SUBL
8.0000 mg | SUBLINGUAL_TABLET | Freq: Once | SUBLINGUAL | Status: AC
  Administered 2024-03-13: 8 mg via SUBLINGUAL
  Filled 2024-03-13: qty 4

## 2024-03-13 MED ORDER — ONDANSETRON HCL 4 MG PO TABS: 4.0000 mg | ORAL_TABLET | Freq: Three times a day (TID) | ORAL | Status: DC | PRN

## 2024-03-13 MED ORDER — MIRTAZAPINE 15 MG PO TABS
7.5000 mg | ORAL_TABLET | Freq: Every day | ORAL | Status: DC
  Administered 2024-03-13: 7.5 mg via ORAL
  Filled 2024-03-13: qty 1

## 2024-03-13 MED ORDER — NICOTINE POLACRILEX 2 MG MT GUM
2.0000 mg | CHEWING_GUM | OROMUCOSAL | Status: DC | PRN
  Administered 2024-03-13 – 2024-03-14 (×2): 2 mg via ORAL
  Filled 2024-03-13 (×6): qty 1

## 2024-03-13 NOTE — ED Triage Notes (Signed)
 Pt arrived via POV c/o SI and being homeless. Pt reports he wants to slit my wrists.

## 2024-03-13 NOTE — ED Triage Notes (Signed)
 Pt reports if he does cut his wrists and is bleeding out, he would want ER Staff to administer blood transfusions to save him.

## 2024-03-13 NOTE — ED Notes (Signed)
 While completing Triage, Pt requests we give him some pain medications.

## 2024-03-13 NOTE — Progress Notes (Signed)
 Inpatient Psychiatric Referral  Patient was recommended inpatient per Jadeka Motley-Mangrum, NP. There are no available beds at [facility], per [facility] Cherylynn Ernst, RN. Patient was referred to the following out of network facilities:  Destination  Service Provider Request Status Address Phone Fax  [facility]  Pending - Request Sent [address] [place] [address] [phone]  [facility]  Pending - Request Sent [address] [place] [address] [phone]  [facility]  Pending - Request Sent [address] [place] [address] [phone]  [facility]  Pending - Request Sent [address] [place] [address] [phone]  [facility]  Pending - Request Sent [address] [place] [address] [phone]  [facility]  Pending - Request Sent [address] [place] [address] [phone]  [facility]  Pending - Request Sent [address] [place] [phone]  [facility]  Pending - Request Sent [address] [place] [address] [phone]    Situation ongoing, CSW to continue following and update chart as more information becomes available.  Harrie Sofia MSW, [facility] 03/13/2024  6:36PM

## 2024-03-13 NOTE — ED Provider Notes (Signed)
 Patient is medically clear for TTS evaluation at this time.   Daralene Lonni BIRCH, PA-C 03/13/24 1708    Simon Lavonia SAILOR, MD 03/16/24 1113

## 2024-03-13 NOTE — ED Provider Notes (Signed)
 [place] EMERGENCY DEPARTMENT AT [facility] Provider Note   CSN: [id number] Arrival date & time: 03/13/24  1541     Patient presents with: Medical Clearance   Cole [name] is a 43 y.o. male.   Patient is a 43 year old male who presents emergency department the chief complaint of suicidal ideation which has been ongoing for approximate the past 2 days.  He denies any attempt to harm himself but does have a plan to cut his wrist.  Patient is currently complaining of diffuse pain at this time.  Patient is chronically on Subutex .  He denies any hallucinations or delusions.  He denies any chest pain, shortness of breath, abdominal pain, nausea, vomiting, diarrhea.  He denies any dizziness, lightheadedness or syncope.  He denies any alcohol use.  Patient is homeless.        Prior to Admission medications   Medication Sig Start Date End Date Taking? Authorizing Provider  ALPRAZolam  (XANAX ) 1 MG tablet Take 0.5-1 mg by mouth 3 (three) times daily as needed for anxiety or sleep.    [provider]  buprenorphine  (SUBUTEX ) 8 MG SUBL SL tablet Place 8 mg under the tongue 2 (two) times daily. 01/09/24   [provider]  gabapentin  (NEURONTIN ) 300 MG capsule Take 600 mg by mouth 3 (three) times daily. 03/06/24 03/21/24  [provider]  gabapentin  (NEURONTIN ) 800 MG tablet Take 800 mg by mouth 3 (three) times daily. 01/26/24   [provider]  hydrocortisone  (ANUSOL -HC) 2.5 % rectal cream Place rectally 2 (two) times daily. 01/26/24   Leigh Corean Massa, MD  ibuprofen  (ADVIL ) 800 MG tablet Take 800 mg by mouth every 8 (eight) hours as needed (back pain). 02/17/24   [provider]  LORazepam  (ATIVAN ) 1 MG tablet Take 1 mg by mouth every 8 (eight) hours as needed for anxiety.    [provider]  methocarbamol  (ROBAXIN ) 500 MG tablet Take 500 mg by mouth every 6 (six) hours as needed for muscle spasms. 02/17/24   [provider]  mirtazapine  (REMERON ) 7.5 MG tablet Take 1 tablet (7.5 mg total) by mouth at bedtime. 01/26/24   Leigh Corean Massa, MD  naloxone  (NARCAN ) nasal spray 4 mg/0.1 mL SMARTSIG:1 Both Nares Daily 01/09/24   [provider]  nicotine  polacrilex (NICORETTE ) 2 MG gum Take 1 each (2 mg total) by mouth as needed for smoking cessation. 01/26/24   Leigh Corean Massa, MD  nortriptyline (PAMELOR) 25 MG capsule Take 25 mg by mouth at bedtime.    [provider]  ondansetron  (ZOFRAN -ODT) 4 MG disintegrating tablet Take 1 tablet (4 mg total) by mouth every 8 (eight) hours as needed for nausea or vomiting. 02/20/24   Theadore Ozell CHRISTELLA, MD  polyethylene glycol (MIRALAX  / GLYCOLAX ) 17 g packet Take 17 g by mouth daily. 01/27/24   Leigh Corean Massa, MD  SUBOXONE  8-2 MG FILM Place 1 Film under the tongue 2 (two) times daily. 02/23/24   [provider]  traZODone  (DESYREL ) 50 MG tablet Take 1 tablet (50 mg total) by mouth at bedtime as needed for sleep. 01/26/24   Leigh Corean Massa, MD  VRAYLAR  3 MG capsule Take 3 mg by mouth daily. 03/13/24   [provider]    Allergies: Aleve  [naproxen  sodium], Aspirin, Ibuprofen , Seroquel [quetiapine fumerate], Tramadol , and Tylenol  [acetaminophen ]    Review of Systems  Psychiatric/Behavioral:  Positive for suicidal ideas.   All other systems reviewed and are negative.   Updated Vital  Signs BP 137/81 (BP Location: Right Arm)   Pulse 75   Temp 99.1 F (37.3 C) (Oral)   Resp 18   Ht 5' 9 (1.753 m)   Wt 81.6 kg   SpO2 98%   BMI 26.58 kg/m   Physical Exam Vitals and nursing note reviewed.  Constitutional:      Appearance: Normal appearance.  HENT:     Head: Normocephalic and atraumatic.     Nose: Nose normal.     Mouth/Throat:     Mouth: Mucous membranes are moist.  Eyes:     Extraocular Movements: Extraocular movements intact.     Conjunctiva/sclera: Conjunctivae normal.     Pupils: Pupils are equal, round, and  reactive to light.  Cardiovascular:     Rate and Rhythm: Normal rate and regular rhythm.     Pulses: Normal pulses.     Heart sounds: Normal heart sounds.  Pulmonary:     Effort: Pulmonary effort is normal. No respiratory distress.     Breath sounds: Normal breath sounds. No stridor. No wheezing, rhonchi or rales.  Abdominal:     General: Abdomen is flat. Bowel sounds are normal. There is no distension.     Palpations: Abdomen is soft.     Tenderness: There is no abdominal tenderness. There is no guarding.  Musculoskeletal:        General: Normal range of motion.     Cervical back: Normal range of motion and neck supple. No rigidity or tenderness.  Skin:    General: Skin is warm and dry.     Findings: No bruising or rash.  Neurological:     General: No focal deficit present.     Mental Status: He is alert and oriented to person, place, and time. Mental status is at baseline.     Cranial Nerves: No cranial nerve deficit.     Sensory: No sensory deficit.     Motor: No weakness.     Coordination: Coordination normal.     Gait: Gait normal.  Psychiatric:        Attention and Perception: He is attentive. He does not perceive auditory or visual hallucinations.        Mood and Affect: Mood is depressed. Mood is not anxious. Affect is not labile, flat, tearful or inappropriate.        Speech: He is communicative. Speech is not rapid and pressured, delayed, slurred or tangential.        Behavior: Behavior is not agitated, slowed, aggressive, withdrawn, hyperactive or combative.        Thought Content: Thought content is not paranoid or delusional. Thought content includes suicidal ideation. Thought content does not include homicidal ideation. Thought content includes suicidal plan. Thought content does not include homicidal plan.        Cognition and Memory: Cognition is not impaired. Memory is not impaired. He does not exhibit impaired recent memory or impaired remote memory.         Judgment: Judgment is not impulsive or inappropriate.     (all labs ordered are listed, but only abnormal results are displayed) Labs Reviewed  COMPREHENSIVE METABOLIC PANEL WITH GFR - Abnormal; Notable for the following components:      Result Value   Potassium 3.4 (*)    Calcium 8.5 (*)    AST 13 (*)    All other components within normal limits  RAPID URINE DRUG SCREEN, HOSP PERFORMED - Abnormal; Notable for the following components:   Cocaine POSITIVE (*)  Benzodiazepines POSITIVE (*)    All other components within normal limits  CBC WITH DIFFERENTIAL/PLATELET - Abnormal; Notable for the following components:   Hemoglobin 12.3 (*)    MCV 78.5 (*)    MCH 24.7 (*)    Platelets 149 (*)    All other components within normal limits  SALICYLATE LEVEL - Abnormal; Notable for the following components:   Salicylate Lvl <7.0 (*)    All other components within normal limits  ACETAMINOPHEN  LEVEL - Abnormal; Notable for the following components:   Acetaminophen  (Tylenol ), Serum <10 (*)    All other components within normal limits  ETHANOL    EKG: None  Radiology: No results found.   Procedures   Medications Ordered in the ED  buprenorphine  (SUBUTEX ) SL tablet 8 mg (has no administration in time range)                                    Medical Decision Making Patient is doing well at this time and does remain stable.  Workup and vital signs are unremarkable at this point.  He has no other acute physical complaints at this time only complaining of chronic pain.  Did reorder patient's home medications.  He is medically clear for TTS evaluation at this time.  Will await further recommendations for final dispo.  Amount and/or Complexity of Data Reviewed Labs: ordered.  Risk OTC drugs. Prescription drug management.        Final diagnoses:  None    ED Discharge Orders     None          Daralene Lonni JONETTA DEVONNA 03/13/24 1714    Simon Lavonia SAILOR,  MD 03/16/24 1114

## 2024-03-13 NOTE — ED Notes (Signed)
 Pt requesting Nicotine  patch. Pt has order for Nicorette  gum which pt was agreeable to take. AC notified of need for gum as ordered.

## 2024-03-13 NOTE — BH Assessment (Addendum)
 Comprehensive Clinical Assessment (CCA) Note  03/13/2024 Cole [name] [id number]  Disposition: Per Cathaleen Adam, NP inpatient treatment is recommended.  [facility] to review.  Disposition SW to pursue appropriate inpatient options.  The patient demonstrates the following risk factors for suicide: Chronic risk factors for suicide include: psychiatric disorder of MDD anxiety, substance use disorder, and demographic factors (male, >41 y/o). Acute risk factors for suicide include: family or marital conflict, social withdrawal/isolation, and loss (financial, interpersonal, professional). Protective factors for this patient include: positive social support, positive therapeutic relationship, and hope for the future. Considering these factors, the overall suicide risk at this point appears to be moderate. Patient is appropriate for outpatient follow up, once stabilized.   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  Patient states he called them stating he needs  a place to stay while endorsing SI.  Patient reports he has been staying with his mother up until they got into an argument recently.  He doesn't give details as to what the argument is about.  He reports he has been homeless now for 2 weeks in the [place] area.  Patient states he is looking for a halfway house for long-term rehab.  He denies other stressors outside of the tension with his mother and current homelessness.  He reports he is Rx Suboxone  by a provider in [place], with [facility](2.5 strips 8mg ).  He also uses cocaine 1-2 times per wk, just a little.  Patient is followed by [facility] for med management and therapy.  He states he has been able to attend appointments when he was living with his mother in [place].  He reports he has been compliant with Rx  meds(Alprazolam , Gabapentin  and Vraylar ).Patient states he plans to work things out with my mom, but it might take time.    Patient continues to endorse SI, with plan to slit my wrists.  He denies HI and AVH.  He is seeking inpatient treatment and hopes to be referred to a halfway house.    Chief Complaint:  Chief Complaint  Patient presents with   Medical Clearance   Visit Diagnosis: Major Depressive Disorder, recurrent, moderate w/o psychotic fx                             Anxiety Disorder Unspecified                             Opioid Use Disorder by hx (managed on Suboxone )    CCA Screening, Triage and Referral (STR)  Patient Reported Information How did you hear about us ? Self  What Is the Reason for Your Visit/Call Today? 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  Patient states he called them stating he needs  a place to stay while endorsing SI.  Patient reports he has been staying with his mother up until they got into an argument recently.  He doesn't give details as to what the argument is about.  He reports he has been homeless now for 2 weeks in the [place] area.  Patient states he is looking for a  halfway house for long-term rehab.  He denies other stressors outside of the tension with his mother and current homelessness.  Patient continues to endorse SI, with plan to slit my wrists.  He denies HI and AVH.  He reprots he is Rx Suboxone  by a provider in [place], with [facility].  He also uses cocaine 1-2 times per wk.  How Long Has This Been Causing You Problems? 1 wk - 1 month  What Do You Feel Would Help You the Most Today? Treatment for Depression or other mood problem; Alcohol or Drug Use Treatment   Have You Recently Had Any Thoughts About Hurting Yourself? Yes  Are You Planning to Commit Suicide/Harm  Yourself At This time? Yes   Flowsheet Row ED from 03/13/2024 in [facility] Emergency Department at [facility] ED from 02/19/2024 in [facility] Emergency Department at [facility] ED from 02/03/2024 in [facility] Emergency Department at [facility]  C-SSRS RISK CATEGORY No Risk No Risk High Risk    Have you Recently Had Thoughts About Hurting Someone Sherral? No  Are You Planning to Harm Someone at This Time? No  Explanation: N/A   Have You Used Any Alcohol or Drugs in the Past 24 Hours? Yes  How Long Ago Did You Use Drugs or Alcohol? Pt uncertain (couple days ago) What Did You Use and How Much? Cocaine, unknown amt   Do You Currently Have a Therapist/Psychiatrist? Yes  Name of Therapist/Psychiatrist: Name of Therapist/Psychiatrist: [facility] for med management and counseling.   Have You Been Recently Discharged From Any Office Practice or Programs? No  Explanation of Discharge From Practice/Program: N/A     CCA Screening Triage Referral Assessment Type of Contact: Tele-Assessment  Telemedicine Service Delivery: Telemedicine service delivery: This service was provided via telemedicine using a 2-way, interactive audio and video technology  Is this Initial or Reassessment? Is this Initial or Reassessment?: Initial Assessment  Date Telepsych consult ordered in [facility]:  Date Telepsych consult ordered in [facility]: 03/13/24  Time Telepsych consult ordered in [facility]:  Time Telepsych consult ordered in [facility]: 1808  Location of Assessment: [facility] ED  Provider Location: Other (comment) (BH provider at [facility])   Collateral Involvement: No collateral involved.   Does Patient Have a Automotive engineer Guardian? No  Legal Guardian Contact Information: N/A  Copy of Legal Guardianship Form: -- (N/A)  Legal Guardian Notified of Arrival: -- (N/A)  Legal Guardian Notified of Pending Discharge: -- (N/A)  If Minor and Not Living with  Parent(s), Who has Custody? N/A  Is CPS involved or ever been involved? Never  Is APS involved or ever been involved? Never   Patient Determined To Be At Risk for Harm To Self or Others Based on Review of Patient Reported Information or Presenting Complaint? Yes, for Self-Harm  Method: -- (N/A, no HI)  Availability of Means: -- (N/A, no HI)  Intent: -- (N/A, no HI)  Notification Required: -- (N/A, no HI)  Additional Information for Danger to Others Potential: -- (N/A, no HI)  Additional Comments for Danger to Others Potential: N/A, no HI  Are There Guns or Other Weapons in Your Home? No  Types of Guns/Weapons: N/A  Are These Weapons Safely Secured?                            -- (N/A)  Who Could Verify You Are Able To Have These Secured:  N/A  Do You Have any Outstanding Charges, Pending Court Dates, Parole/Probation? None  Contacted To Inform of Risk of Harm To Self or Others: Patent examiner [facility] provider covering [facility])    Does Patient Present under Involuntary Commitment? No    [place] of Residence: [place]   Patient Currently Receiving the Following Services: Individual Therapy; Medication Management   Determination of Need: Urgent (48 hours)   Options For Referral: Inpatient Hospitalization     CCA Biopsychosocial Patient Reported Schizophrenia/Schizoaffective Diagnosis in Past: No   Strengths: Patient is seeking treatment and he is engaged in outpt MH treatment   Mental Health Symptoms Depression:  Difficulty Concentrating; Sleep (too much or little); Hopelessness; Fatigue; Worthlessness   Duration of Depressive symptoms: Duration of Depressive Symptoms: Greater than two weeks   Mania:  None   Anxiety:   Tension; Worrying; Sleep   Psychosis:  None   Duration of Psychotic symptoms:    Trauma:  None   Obsessions:  None   Compulsions:  None   Inattention:  N/A   Hyperactivity/Impulsivity:  N/A   Oppositional/Defiant Behaviors:  N/A    Emotional Irregularity:  Recurrent suicidal behaviors/gestures/threats   Other Mood/Personality Symptoms:  Depression symptoms.    Mental Status Exam Appearance and self-care  Stature:  Average   Weight:  Average weight   Clothing:  Casual (Pt dressed in scrubs.)   Grooming:  Normal   Cosmetic use:  None   Posture/gait:  Normal   Motor activity:  Not Remarkable   Sensorium  Attention:  Normal   Concentration:  Normal   Orientation:  X5   Recall/memory:  Normal   Affect and Mood  Affect:  Depressed   Mood:  Hopeless; Depressed   Relating  Eye contact:  Normal   Facial expression:  Depressed; Sad   Attitude toward examiner:  Cooperative   Thought and Language  Speech flow: Normal   Thought content:  Appropriate to Mood and Circumstances   Preoccupation:  None   Hallucinations:  None   Organization:  Coherent   [employer] of Knowledge:  Fair   Intelligence:  Average   Abstraction:  Functional   Judgement:  Poor; Impaired   Reality Testing:  Realistic   Insight:  Lacking   Decision Making:  Impulsive   Social Functioning  Social Maturity:  Isolates   Social Judgement:  Chief of Staff   Stress  Stressors:  Housing; Surveyor, quantity; Veterinary surgeon; Transitions   Coping Ability:  Overwhelmed; Deficient supports   Skill Deficits:  Decision making; Responsibility; Interpersonal; Self-control   Supports:  Friends/Service system     Religion: Religion/Spirituality Are You A Religious Person?: Yes What is Your Religious Affiliation?: Christian How Might This Affect Treatment?: None.  Leisure/Recreation: Leisure / Recreation Do You Have Hobbies?: Yes Leisure and Hobbies: side jobs  Exercise/Diet: Exercise/Diet Do You Exercise?: No Have You Gained or Lost A Significant Amount of Weight in the Past Six Months?: No Do You Follow a Special Diet?: No Do You Have Any Trouble Sleeping?: Yes Explanation of Sleeping  Difficulties: poor while homeless   CCA Employment/Education Employment/Work Situation: Employment / Work Situation Employment Situation: Unemployed Patient's Job has Been Impacted by Current Illness: No Has Patient ever Been in Equities trader?: No  Education: Education Is Patient Currently Attending School?: No Last Grade Completed: 11 Did You Product manager?: No Did You Have An Individualized Education Program (IIEP): No Did You Have Any Difficulty At School?: No Patient's Education Has Been Impacted by Current  Illness: No   CCA Family/Childhood History Family and Relationship History: Family history Marital status: Single Does patient have children?: Yes How many children?: 1 How is patient's relationship with their children?: we talk every now and then  Childhood History:  Childhood History By whom was/is the patient raised?: Mother, Other (Comment) (Patient reports also being raised by his mother's sister) Did patient suffer any verbal/emotional/physical/sexual abuse as a child?: No Did patient suffer from severe childhood neglect?: No Has patient ever been sexually abused/assaulted/raped as an adolescent or adult?: No Was the patient ever a victim of a crime or a disaster?: No Witnessed domestic violence?: Yes Has patient been affected by domestic violence as an adult?: No Description of domestic violence: uta       CCA Substance Use Alcohol/Drug Use: Alcohol / Drug Use Pain Medications: See MAR Prescriptions: See MAR Over the Counter: See MAR History of alcohol / drug use?: Yes Longest period of sobriety (when/how long): occasional cocaine a little 1-2 times per week Negative Consequences of Use: Legal, Financial Withdrawal Symptoms: None                         ASAM's:  Six Dimensions of Multidimensional Assessment  Dimension 1:  Acute Intoxication and/or Withdrawal Potential:      Dimension 2:  Biomedical Conditions and Complications:       Dimension 3:  Emotional, Behavioral, or Cognitive Conditions and Complications:     Dimension 4:  Readiness to Change:     Dimension 5:  Relapse, Continued use, or Continued Problem Potential:     Dimension 6:  Recovery/Living Environment:     ASAM Severity Score:    ASAM Recommended Level of Treatment:     Substance use Disorder (SUD)    Recommendations for Services/Supports/Treatments:    Disposition Recommendation per psychiatric provider: We recommend inpatient psychiatric hospitalization when medically cleared. Patient is under voluntary admission status at this time; please IVC if attempts to leave hospital.   DSM5 Diagnoses: Patient Active Problem List   Diagnosis Date Noted   Benzodiazepine dependence (HCC) 01/24/2024   Drug-seeking behavior 01/24/2024   MDD (major depressive disorder), recurrent episode, severe (HCC) 01/23/2024   Cocaine use disorder, severe, dependence (HCC) 10/06/2023   Cannabis use disorder, severe, dependence (HCC) 10/06/2023   Tardive dyskinesia 02/24/2023   Schizoaffective disorder, bipolar type (HCC) 02/20/2023   Homelessness 12/13/2022   Polysubstance abuse (HCC) 12/13/2022   Substance induced mood disorder (HCC) 12/13/2022   Suicidal ideation 12/13/2022   Severe recurrent major depression (HCC) 07/16/2017   Major depressive disorder, recurrent episode, moderate (HCC) 08/13/2015   Opioid use disorder, severe, dependence (HCC) per history 08/13/2015   Benzodiazepine abuse (HCC) 08/13/2015   Tobacco use disorder 08/13/2015   Hepatitis C antibody test positive 01/22/2015   PTSD (post-traumatic stress disorder) 01/20/2015   Stimulant use disorder (HCC) 01/20/2015   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

## 2024-03-13 NOTE — Progress Notes (Signed)
 Pt has been accepted to [facility] on 03/13/2024 . Unit assignment: [facility]   Pt meets inpatient criteria per Cathaleen Adam, NP   Attending Physician will be Roxine Skiff, MD  Report can be called to: [phone]  Pt can arrive after 8AM  Care Team Notified: Donnice Bars, Paramedic,  Deland Louder, [facility]

## 2024-03-14 NOTE — ED Notes (Signed)
 Pt has been accepted to [facility] on 03/13/2024 . Unit assignment: [facility]    Pt meets inpatient criteria per Cathaleen Adam, NP    Attending Physician will be Roxine Skiff, MD   Report can be called to: [phone]   Pt can arrive after 8AM   Care Team Notified: Donnice Bars, Paramedic,  Deland Louder, [facility]

## 2024-03-21 ENCOUNTER — Emergency Department (HOSPITAL_COMMUNITY)
Admission: EM | Admit: 2024-03-21 | Discharge: 2024-03-21 | Discharge status: Against Medical Advice | Attending: Emergency Medicine | Admitting: Emergency Medicine

## 2024-03-21 ENCOUNTER — Other Ambulatory Visit: Payer: Self-pay

## 2024-03-21 ENCOUNTER — Encounter (HOSPITAL_COMMUNITY): Payer: Self-pay | Admitting: Emergency Medicine

## 2024-03-21 DIAGNOSIS — Z5321 Procedure and treatment not carried out due to patient leaving prior to being seen by health care provider: Secondary | ICD-10-CM | POA: Diagnosis not present

## 2024-03-21 DIAGNOSIS — G8929 Other chronic pain: Secondary | ICD-10-CM | POA: Diagnosis not present

## 2024-03-21 DIAGNOSIS — M545 Low back pain, unspecified: Secondary | ICD-10-CM | POA: Diagnosis present

## 2024-03-21 NOTE — ED Notes (Addendum)
 Pt stated he had to leave because his ride was here and he didn't want to be seen. EDP made aware

## 2024-03-21 NOTE — ED Triage Notes (Signed)
 Pt bib [facility] for chronic lower back pain that started bothering him after walking.

## 2024-04-02 ENCOUNTER — Ambulatory Visit (INDEPENDENT_AMBULATORY_CARE_PROVIDER_SITE_OTHER): Admitting: Podiatry

## 2024-04-02 ENCOUNTER — Encounter: Payer: Self-pay | Admitting: Podiatry

## 2024-04-02 VITALS — Ht 69.0 in | Wt 180.8 lb

## 2024-04-02 DIAGNOSIS — Z5321 Procedure and treatment not carried out due to patient leaving prior to being seen by health care provider: Secondary | ICD-10-CM

## 2024-04-02 NOTE — Progress Notes (Signed)
 Left without being seen.

## 2024-04-05 ENCOUNTER — Encounter (HOSPITAL_COMMUNITY): Payer: Self-pay | Admitting: Emergency Medicine

## 2024-04-05 ENCOUNTER — Emergency Department (HOSPITAL_COMMUNITY)
Admission: EM | Admit: 2024-04-05 | Discharge: 2024-04-05 | Disposition: A | Discharge status: Home / Self Care | Attending: Emergency Medicine | Admitting: Emergency Medicine

## 2024-04-05 ENCOUNTER — Other Ambulatory Visit: Payer: Self-pay

## 2024-04-05 ENCOUNTER — Emergency Department (HOSPITAL_COMMUNITY)
Admission: EM | Admit: 2024-04-05 | Discharge: 2024-04-05 | Disposition: A | Discharge status: Home / Self Care | Source: Home / Self Care

## 2024-04-05 DIAGNOSIS — F1490 Cocaine use, unspecified, uncomplicated: Secondary | ICD-10-CM | POA: Diagnosis present

## 2024-04-05 DIAGNOSIS — Z765 Malingerer [conscious simulation]: Secondary | ICD-10-CM

## 2024-04-05 DIAGNOSIS — F1420 Cocaine dependence, uncomplicated: Secondary | ICD-10-CM | POA: Diagnosis not present

## 2024-04-05 DIAGNOSIS — Z5900 Homelessness unspecified: Secondary | ICD-10-CM | POA: Diagnosis not present

## 2024-04-05 DIAGNOSIS — F1410 Cocaine abuse, uncomplicated: Secondary | ICD-10-CM

## 2024-04-05 LAB — ETHANOL: Alcohol, Ethyl (B): <15 mg/dL (ref <15)

## 2024-04-05 LAB — CBC
HCT: 43.6 % (ref 39.0–52.0)
Hemoglobin: 14 g/dL (ref 13.0–17.0)
MCH: 25.7 pg — ABNORMAL LOW (ref 26.0–34.0)
MCHC: 32.1 g/dL (ref 30.0–36.0)
MCV: 80 fL (ref 80.0–100.0)
Platelets: 196 K/uL (ref 150–400)
RBC: 5.45 MIL/uL (ref 4.22–5.81)
RDW: 14.6 % (ref 11.5–15.5)
WBC: 5.1 K/uL (ref 4.0–10.5)
nRBC: 0 % (ref 0.0–0.2)

## 2024-04-05 LAB — RAPID URINE DRUG SCREEN, HOSP PERFORMED
Amphetamines: NOT DETECTED
Barbiturates: NOT DETECTED
Benzodiazepines: NOT DETECTED
Cocaine: POSITIVE — AB
Opiates: NOT DETECTED
Tetrahydrocannabinol: NOT DETECTED

## 2024-04-05 LAB — COMPREHENSIVE METABOLIC PANEL WITH GFR
ALT: 19 U/L (ref 0–44)
AST: 16 U/L (ref 15–41)
Albumin: 4.1 g/dL (ref 3.5–5.0)
Alkaline Phosphatase: 44 U/L (ref 38–126)
Anion gap: 10 (ref 5–15)
BUN: 14 mg/dL (ref 6–20)
CO2: 27 mmol/L (ref 22–32)
Calcium: 9 mg/dL (ref 8.9–10.3)
Chloride: 104 mmol/L (ref 98–111)
Creatinine, Ser: 0.78 mg/dL (ref 0.61–1.24)
GFR, Estimated: >60 mL/min (ref >60)
Glucose, Bld: 70 mg/dL (ref 70–99)
Potassium: 3.8 mmol/L (ref 3.5–5.1)
Sodium: 141 mmol/L (ref 135–145)
Total Bilirubin: 0.9 mg/dL (ref 0.0–1.2)
Total Protein: 7.6 g/dL (ref 6.5–8.1)

## 2024-04-05 LAB — ACETAMINOPHEN LEVEL: Acetaminophen (Tylenol), Serum: <10 ug/mL — ABNORMAL LOW (ref 10–30)

## 2024-04-05 LAB — SALICYLATE LEVEL: Salicylate Lvl: <7 mg/dL — ABNORMAL LOW (ref 7.0–30.0)

## 2024-04-05 NOTE — ED Notes (Signed)
 Upon discharge pt stated, I'll be back tonight cause I ain't got nowhere to go.
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
## 2024-04-05 NOTE — Discharge Instructions (Addendum)
 Recommend consider close outpatient follow-up with your outpatient psychiatric provider team Recommend consider close outpatient follow-up with substance abuse resources Recommend adhere to safety return precautions discussed today Recommend consider abstaining from illicit substances endorsed to be using Recommend continue current outpatient psychiatric medication regimen  Please see the resource list below for mental health assistance and behavioral health resources  .Substance Abuse Treatment Programs  Intensive Outpatient Programs [facility]     [address]      [place], [place]                   [phone]       [facility] [address] [place] [address] [place] [address] [place], [place] [phone]  [facility]     (Inpatient and outpatient)     [address]           [phone]    [facility] [phone] (Suboxone  and Methadone)  [address]      [place], [place] [address]      [phone]       [address] [place], [place] [phone]  [facility] (Outpatient/Inpatient, [facility])    (insurance only) [phone]             [facility] (Groups & Residential) [place], [place] [phone]     [facility]     [address]     [place], [place]      [phone]       [facility] (for caregivers and family) [phone] [place], [place] [phone]      Residential Treatment Programs [facility]      [address] [place], [place] [address]  [phone]       [facility] [address] [place], [place] [address] [phone]  [facility] [place]        [phone]       [facility] [phone]  [facility] ([facility]             [address]                                         [place], [place]                                                [phone] or [phone]                                [facility] [place] [address] [place], [address] [phone]  [facility]    [address]      [place], [place]     [phone]       [facility] [address] [place], [place] [phone]  [facility]   [address] [place], [place] [address]     [phone]      Admissions: 8am-3pm M-F  [facility] ([facility]) [address] [place], [place] [phone]  [facility]: Residential Program [phone] Days)   [place], [place]      [phone] or [phone]  [facility]: [facility] [place], [place] (Walk in Hours over the weekend or by referral)  [facility] [address] [place], [place], [place] [address] [phone]  Crisis Mobile: [facility]:  8727636915 (for crisis response 24 hours a day) [facility] Hotline:      [phone] Outpatient Psychiatry and Counseling  [facility]: Mobile Crisis Management 24 hours:  2040096866  [facility] of the [employer] sliding scale fee and walk in schedule: M-F 8am-12pm/1pm-3pm [address]  [place], [place] [address] [phone]  [facility] [address] [place], [place] [address] [phone]  [facility] ([facility] [employer])- new patient walk-in appointments available Monday - Friday 8am -3pm.          [address] [place], [place] [address] [phone] or crisis line- [phone]  [facility]/ Intensive Outpatient Therapy Program [address] [place], [place] [address] [phone]  [facility]                  Crisis Services      [phone] [address]     [place], [place] [address]                 [facility]   [facility] [phone]. [address] [place], [place] [address]   [employer] [facility]          [address] [place]  [place], [place] [address]       [phone]  [facility] [address] [place], [place] [address] [phone]  [facility]    [address]    [place], [place] [address]     [phone]       Ima Anon, MD     [address]     [place] [address]     [phone]       [facility] [address] [place] [address]  [facility]     [address]     [place], [place]      [phone]       [facility] [place], MD [address] [place], [place] [address] [phone]  [facility]     [address]     [place], [place] [address]     [phone]       [facility] [address] [place], [place] [address] [phone] Resources for Temporary Residential Assistance/Crisis Centers  [facility]) M-F 8am-3pm   [address]  [place], [place] [address]   [phone] Services include: laundry, barbering, support groups, case management, phone  & computer access, showers, AA/NA mtgs, mental health/substance abuse nurse, job skills class, disability information, VA assistance, spiritual classes, etc.   HOMELESS SHELTERS  [facility]     [facility]   [address] [place]     [phone]              [employer] (women and children)       [address] [place], [place] [address] [phone] [email or web address] for application and process Application Required  [facility] [employer] [facility]   [address]    [place] [address]     [phone]  [employer] [facility] [place] [address] [place], [place] [address] [phone] application appt.) Application Required  [facility] (women only)    [address]     [place], [place] [address]     [phone]      Intake starts 6pm daily Need valid ID, SSC, & Police report [employer] [address] [place], [place] [phone] Application Required  [employer] (men only)     [address]      [place], [place]     [phone]       [facility] [place] [facility] [place] (Pregnant women only) [address] [place], [place] [phone]  [facility]      [address]      [place], [place] [address]     [phone]             [facility] [address] [place], [place] [phone] 90 day commitment/SA/Application process  [facility](men only)     [address]     [place], [place]     [phone]       Check-in at [facility] [address] [place], [place] [address] [phone] Men/Women/Women and Children must be there by 7 pm  [facility] [place], [place] [phone]

## 2024-04-05 NOTE — ED Notes (Signed)
 Pt dressed out into [facility] appropriate scrubs, belongings placed into belongings bag include, jeans, jacket, shoes, t shirt, and hair pick. Belongings bag labeled with patient specific sticker and secured in locker 2

## 2024-04-05 NOTE — ED Triage Notes (Signed)
 Pt endorses feeling suicidal. Started this morning. Pt states his plan is to cut his wrists. Pt states he has been compliant with his medications. Pt slightly restless in triage but cooperative. Denies avh.

## 2024-04-06 ENCOUNTER — Other Ambulatory Visit: Payer: Self-pay

## 2024-04-06 ENCOUNTER — Encounter (HOSPITAL_COMMUNITY): Payer: Self-pay

## 2024-04-06 ENCOUNTER — Emergency Department (HOSPITAL_COMMUNITY)
Admission: EM | Admit: 2024-04-06 | Discharge: 2024-04-06 | Discharge status: Against Medical Advice | Attending: Emergency Medicine | Admitting: Emergency Medicine

## 2024-04-06 DIAGNOSIS — F1490 Cocaine use, unspecified, uncomplicated: Secondary | ICD-10-CM | POA: Diagnosis not present

## 2024-04-06 DIAGNOSIS — F32A Depression, unspecified: Secondary | ICD-10-CM | POA: Insufficient documentation

## 2024-04-06 DIAGNOSIS — Z765 Malingerer [conscious simulation]: Secondary | ICD-10-CM | POA: Diagnosis not present

## 2024-04-06 DIAGNOSIS — R45851 Suicidal ideations: Secondary | ICD-10-CM | POA: Diagnosis not present

## 2024-04-06 DIAGNOSIS — F1290 Cannabis use, unspecified, uncomplicated: Secondary | ICD-10-CM | POA: Insufficient documentation

## 2024-04-06 LAB — CBC
HCT: 40.2 % (ref 39.0–52.0)
Hemoglobin: 13.3 g/dL (ref 13.0–17.0)
MCH: 25.7 pg — ABNORMAL LOW (ref 26.0–34.0)
MCHC: 33.1 g/dL (ref 30.0–36.0)
MCV: 77.8 fL — ABNORMAL LOW (ref 80.0–100.0)
Platelets: 176 K/uL (ref 150–400)
RBC: 5.17 MIL/uL (ref 4.22–5.81)
RDW: 14.5 % (ref 11.5–15.5)
WBC: 7.6 K/uL (ref 4.0–10.5)
nRBC: 0 % (ref 0.0–0.2)

## 2024-04-06 LAB — COMPREHENSIVE METABOLIC PANEL WITH GFR
ALT: 19 U/L (ref 0–44)
AST: 17 U/L (ref 15–41)
Albumin: 4.4 g/dL (ref 3.5–5.0)
Alkaline Phosphatase: 43 U/L (ref 38–126)
Anion gap: 10 (ref 5–15)
BUN: 16 mg/dL (ref 6–20)
CO2: 25 mmol/L (ref 22–32)
Calcium: 9.3 mg/dL (ref 8.9–10.3)
Chloride: 104 mmol/L (ref 98–111)
Creatinine, Ser: 0.71 mg/dL (ref 0.61–1.24)
GFR, Estimated: >60 mL/min (ref >60)
Glucose, Bld: 83 mg/dL (ref 70–99)
Potassium: 3.4 mmol/L — ABNORMAL LOW (ref 3.5–5.1)
Sodium: 139 mmol/L (ref 135–145)
Total Bilirubin: 1.3 mg/dL — ABNORMAL HIGH (ref 0.0–1.2)
Total Protein: 7.9 g/dL (ref 6.5–8.1)

## 2024-04-06 LAB — ETHANOL: Alcohol, Ethyl (B): <15 mg/dL (ref <15)

## 2024-04-06 NOTE — ED Triage Notes (Signed)
 Pt complaining of SI which started yesterday. Was seen yesterday for same complaint. Pt stated that he also has blisters on his feet.

## 2024-04-06 NOTE — ED Notes (Signed)
 Provider at bedside

## 2024-04-06 NOTE — ED Notes (Signed)
 Pt asked for his clothes while walking by the nurse's station, pt continues to walk out, ED provider notified, pt belongings given to security per request to return to pt

## 2024-04-06 NOTE — ED Provider Notes (Signed)
 [name]   CSN: [id number] Arrival date & time: 04/06/24  1547     Patient presents with: Suicidal   [name] is a 43 y.o. male who presents to the ED today with a history of suicidal ideation, states that he has a plan to cut his wrists and he does have razor blades that he can do this with.  Does endorse intermittently using cannabis as well as cocaine with last cocaine use being 2 days prior last cannabis use being approximately a week ago denies having any recent ethanol use.  Review of his medical history shows diagnoses of major depressive disorder, schizoaffective disorder, severe recurrent major depression, opioid use disorder, PTSD, and previous episodes of suicidal ideation.  Further has standing prescription for Suboxone  secondary to previous opioid use disorder.  Patient requesting Suboxone  here in the ED today.  Seen in this ED yesterday, reported that at that time that he had been kicked out of his home, states that he has been walking around on the street with bare feet, depressed because he has nowhere to live.  At visit yesterday patient was reviewed by psych consultation, discharged with diagnoses of malingering and cocaine use disorder.  He was provided with resources, says that [facility] to [facility] today however he states that he ran into problems with potential precautions secondary to his suicidal ideation, therefore he left and is walked back to this facility for reassessment.   HPI     Prior to Admission medications   Medication Sig Start Date End Date Taking? Authorizing Provider  ALPRAZolam  (XANAX ) 1 MG tablet Take 0.5-1 mg by mouth 3 (three) times daily as needed for anxiety or sleep.    [provider]  clonazePAM  (KLONOPIN ) 0.5 MG tablet Take 0.5 mg by mouth 3 (three) times daily as needed for anxiety. 04/02/24   [provider]  gabapentin  (NEURONTIN ) 800 MG tablet Take 800 mg by  mouth 3 (three) times daily. 01/26/24   [provider]  hydrocortisone  (ANUSOL -HC) 2.5 % rectal cream Place rectally 2 (two) times daily. Patient taking differently: Apply 1 Application topically 2 (two) times daily. 01/26/24   Leigh Corean Massa, MD  mirtazapine  (REMERON ) 7.5 MG tablet Take 1 tablet (7.5 mg total) by mouth at bedtime. 01/26/24   Leigh Corean Massa, MD  naloxone  (NARCAN ) nasal spray 4 mg/0.1 mL SMARTSIG:1 Both Nares Daily 01/09/24   [provider]  nicotine  polacrilex (NICORETTE ) 2 MG gum Take 1 each (2 mg total) by mouth as needed for smoking cessation. 01/26/24   Leigh Corean Massa, MD  SUBOXONE  8-2 MG FILM Place 1 Film under the tongue 2 (two) times daily. 02/23/24   [provider]    Allergies: Aleve  [naproxen  sodium], Aspirin, Ibuprofen , Seroquel [quetiapine fumerate], Tramadol , and Tylenol  [acetaminophen ]    Review of Systems  Psychiatric/Behavioral:  Positive for dysphoric mood and self-injury. The patient is nervous/anxious.   All other systems reviewed and are negative.   Updated Vital Signs BP 127/84 (BP Location: Right Arm)   Pulse 68   Temp 98.8 F (37.1 C) (Oral)   Ht 5' 9 (1.753 m)   Wt 88.5 kg   SpO2 100%   BMI 28.81 kg/m   Physical Exam Vitals and nursing note reviewed.  Constitutional:      General: He is not in acute distress.    Appearance: He is well-developed.     Comments: Disheveled appearance  HENT:  Head: Normocephalic and atraumatic.  Eyes:     Conjunctiva/sclera: Conjunctivae normal.  Cardiovascular:     Rate and Rhythm: Normal rate and regular rhythm.     Heart sounds: No murmur heard. Pulmonary:     Effort: Pulmonary effort is normal. No respiratory distress.     Breath sounds: Normal breath sounds.  Abdominal:     Palpations: Abdomen is soft.     Tenderness: There is no abdominal tenderness.  Musculoskeletal:        General: No swelling.     Cervical back: Neck supple.  Skin:     General: Skin is warm and dry.     Capillary Refill: Capillary refill takes less than 2 seconds.  Neurological:     Mental Status: He is alert and oriented to person, place, and time. Mental status is at baseline.     GCS: GCS eye subscore is 4. GCS verbal subscore is 5. GCS motor subscore is 6.     Cranial Nerves: No cranial nerve deficit or facial asymmetry.  Psychiatric:        Attention and Perception: Attention and perception normal.        Mood and Affect: Mood is depressed. Affect is flat.        Speech: Speech is delayed.        Behavior: Behavior is withdrawn. Behavior is cooperative.        Thought Content: Thought content includes suicidal ideation. Thought content includes suicidal plan.        Cognition and Memory: Cognition normal.     Comments: Endorses plan of suicide with razor blades to cut his wrists.     (all labs ordered are listed, but only abnormal results are displayed) Labs Reviewed  CBC - Abnormal; Notable for the following components:      Result Value   MCV 77.8 (*)    MCH 25.7 (*)    All other components within normal limits  ETHANOL  COMPREHENSIVE METABOLIC PANEL WITH GFR  RAPID URINE DRUG SCREEN, HOSP PERFORMED    EKG: None  Radiology: No results found.   Procedures   Medications Ordered in the ED - No data to display                                  Medical Decision Making Amount and/or Complexity of Data Reviewed Labs: ordered.    Medical Decision Making:   [name] is a 43 y.o. male who presented to the ED today with suicidal ideation detailed above.     Complete initial physical exam performed, notably the patient  was alert and oriented.    Reviewed and confirmed nursing documentation for past medical history, family history, social history.    Initial Assessment:   With the patient's presentation of suicidal ideation, most likely diagnosis is suicidal ideation, psychiatric disturbance, continued sequelae of  schizoaffective disorder.   Initial Plan:   To provide for medical clearance, screening labs including CBC and Metabolic panel to evaluate for infectious or metabolic etiology of disease.  Secondary to previous admission to ethanol use as well as substance use, obtain serum ethanol as well as UDS Urinalysis with reflex culture ordered to evaluate for UTI or relevant urologic/nephrologic pathology.  TTS consultation for SI. Objective evaluation as below reviewed   Initial Study Results:   Laboratory  All laboratory results reviewed without evidence of clinically relevant pathology.   Exceptions  include: None  Reassessment and Plan:   Before reevaluation the patient and TTS consultation accomplished, patient eloped.  Unable to complete reassessment at this time nor discuss consequences with patient as he left from his hallway bed.  Patient provided with his belongings by nursing staff in the waiting area after he had eloped.       Final diagnoses:  None    ED Discharge Orders     None          Myriam Dorn BROCKS, [place] 04/06/24 1852    Franklyn Sid SAILOR, MD 04/06/24 [phone]

## 2024-04-07 ENCOUNTER — Emergency Department (HOSPITAL_COMMUNITY)
Admission: EM | Admit: 2024-04-07 | Discharge: 2024-04-07 | Discharge status: Against Medical Advice | Attending: Emergency Medicine | Admitting: Emergency Medicine

## 2024-04-07 ENCOUNTER — Encounter (HOSPITAL_COMMUNITY): Payer: Self-pay

## 2024-04-07 ENCOUNTER — Other Ambulatory Visit: Payer: Self-pay

## 2024-04-07 DIAGNOSIS — Z5900 Homelessness unspecified: Secondary | ICD-10-CM | POA: Diagnosis not present

## 2024-04-07 DIAGNOSIS — M79671 Pain in right foot: Secondary | ICD-10-CM | POA: Insufficient documentation

## 2024-04-07 DIAGNOSIS — Z5321 Procedure and treatment not carried out due to patient leaving prior to being seen by health care provider: Secondary | ICD-10-CM | POA: Diagnosis not present

## 2024-04-07 DIAGNOSIS — M79672 Pain in left foot: Secondary | ICD-10-CM | POA: Diagnosis not present

## 2024-04-07 NOTE — ED Triage Notes (Addendum)
 PER EMS: pt was picked up from [facility] with c/o bilateral foot pain. He reports he is on his feet a lot and has blisters on them. He is ambulatory with independent steady gait. He is homeless as well and is requesting to take a shower while he is here. Denies any other symptoms or complaints.   BP-128/80, HR-67, 97% RA, RR-16

## 2024-04-07 NOTE — ED Notes (Signed)
 Pt ambulating to the bathroom independently without any assistance.

## 2024-04-07 NOTE — ED Notes (Signed)
 Patient walked up to nurses station and stated the we had nothing for him and he was ready to go and proceeded to walk away, I tried to get patient to sign AMA form, but patient continued to walk away.

## 2024-04-21 ENCOUNTER — Encounter (HOSPITAL_COMMUNITY): Payer: Self-pay | Admitting: Emergency Medicine

## 2024-04-21 ENCOUNTER — Other Ambulatory Visit: Payer: Self-pay

## 2024-04-21 ENCOUNTER — Emergency Department (HOSPITAL_COMMUNITY)

## 2024-04-21 ENCOUNTER — Emergency Department (HOSPITAL_COMMUNITY)
Admission: EM | Admit: 2024-04-21 | Discharge: 2024-04-21 | Disposition: A | Discharge status: Home / Self Care | Attending: Emergency Medicine | Admitting: Emergency Medicine

## 2024-04-21 DIAGNOSIS — M79674 Pain in right toe(s): Secondary | ICD-10-CM | POA: Diagnosis present

## 2024-04-21 DIAGNOSIS — B353 Tinea pedis: Secondary | ICD-10-CM | POA: Insufficient documentation

## 2024-04-21 MED ORDER — CLOTRIMAZOLE 1 % EX CREA: TOPICAL_CREAM | CUTANEOUS | 0 refills | Status: AC

## 2024-04-21 NOTE — ED Triage Notes (Signed)
 Pt with c/o toe pain (last 3 ) on R foot x 1 day. Denies any known injury.

## 2024-04-21 NOTE — ED Notes (Signed)
 ED Provider at bedside.

## 2024-04-21 NOTE — ED Provider Notes (Signed)
 [place] EMERGENCY DEPARTMENT AT [facility] Provider Note   CSN: [id number] Arrival date & time: 04/21/24  9443     Patient presents with: Toe Pain   [name] is a 43 y.o. male.   Presents to the emergency department stating that he has toe pain.  Patient reports pain in the 3rd, 4th and 5th toes on the right foot.  He denies any injury.       Prior to Admission medications   Medication Sig Start Date End Date Taking? Authorizing Provider  clotrimazole  (LOTRIMIN ) 1 % cream Apply to affected area 2 times daily 04/21/24  Yes Vitalia Stough, Lonni PARAS, MD  ALPRAZolam  (XANAX ) 1 MG tablet Take 0.5-1 mg by mouth 3 (three) times daily as needed for anxiety or sleep.    [provider]  clonazePAM  (KLONOPIN ) 0.5 MG tablet Take 0.5 mg by mouth 3 (three) times daily as needed for anxiety. 04/02/24   [provider]  gabapentin  (NEURONTIN ) 800 MG tablet Take 800 mg by mouth 3 (three) times daily. 01/26/24   [provider]  hydrocortisone  (ANUSOL -HC) 2.5 % rectal cream Place rectally 2 (two) times daily. Patient taking differently: Apply 1 Application topically 2 (two) times daily. 01/26/24   Leigh Corean Massa, MD  mirtazapine  (REMERON ) 7.5 MG tablet Take 1 tablet (7.5 mg total) by mouth at bedtime. 01/26/24   Leigh Corean Massa, MD  naloxone  (NARCAN ) nasal spray 4 mg/0.1 mL SMARTSIG:1 Both Nares Daily 01/09/24   [provider]  nicotine  polacrilex (NICORETTE ) 2 MG gum Take 1 each (2 mg total) by mouth as needed for smoking cessation. 01/26/24   Leigh Corean Massa, MD  SUBOXONE  8-2 MG FILM Place 1 Film under the tongue 2 (two) times daily. 02/23/24   [provider]    Allergies: Aleve  [naproxen  sodium], Aspirin, Ibuprofen , Seroquel [quetiapine fumerate], Tramadol , and Tylenol  [acetaminophen ]    Review of Systems  Updated Vital Signs BP (!) 138/93   Pulse (!) 59   Temp (!) 97.4 F (36.3 C) (Oral)   Resp 16   Ht 5' 9  (1.753 m)   Wt 88 kg   SpO2 97%   BMI 28.65 kg/m   Physical Exam Vitals and nursing note reviewed.  Constitutional:      Appearance: Normal appearance.  Eyes:     Pupils: Pupils are equal, round, and reactive to light.  Cardiovascular:     Rate and Rhythm: Normal rate.  Pulmonary:     Effort: Pulmonary effort is normal.     Breath sounds: Normal breath sounds.  Musculoskeletal:        General: Normal range of motion.     Right foot: Tenderness present. No swelling, deformity or laceration.     Comments: Maceration of the skin between the webbing of the toes  Neurological:     Mental Status: He is alert.     (all labs ordered are listed, but only abnormal results are displayed) Labs Reviewed - No data to display  EKG: None  Radiology: DG Foot Complete Right Result Date: 04/21/2024 CLINICAL DATA:  Pain in the third through fifth toes of the right foot. EXAM: RIGHT FOOT COMPLETE - 3+ VIEW COMPARISON:  Right foot series 07/04/2017 postoperatively. FINDINGS: Three views. There is normal bone mineralization with no evidence of fractures. There is a healed osteotomy of the medial aspect of distal metaphysis and shaft of the great toe proximal phalanx and narrowing and spurring of the great toe MTP and interphalangeal  joints. The other toes are unremarkable. Arthritic changes are not seen elsewhere. Soft tissues are unremarkable. IMPRESSION: 1. No evidence of fractures. 2. Healed osteotomy of the great toe proximal phalanx. 3. Nonerosive degenerative changes of the great toe MTP and interphalangeal joints. Electronically Signed   By: Francis Quam M.D.   On: 04/21/2024 06:57     Procedures   Medications Ordered in the ED - No data to display                                  Medical Decision Making Amount and/or Complexity of Data Reviewed Radiology: ordered.   Dents with pain of the 3rd, 4th and 5th toes on the right foot.  Normal capillary refill.  No known trauma.  No  evidence of ischemia.  No bacterial infections.  Maceration of the skin consistent with athlete's foot.     Final diagnoses:  Athlete's foot on right    ED Discharge Orders          Ordered    clotrimazole  (LOTRIMIN ) 1 % cream        04/21/24 9385               Haze Lonni PARAS, MD 04/21/24 [phone]

## 2024-05-09 ENCOUNTER — Other Ambulatory Visit: Payer: Self-pay

## 2024-05-09 ENCOUNTER — Emergency Department (HOSPITAL_COMMUNITY)
Admission: EM | Admit: 2024-05-09 | Discharge: 2024-05-09 | Disposition: A | Discharge status: Home / Self Care | Attending: Emergency Medicine | Admitting: Emergency Medicine

## 2024-05-09 DIAGNOSIS — J45909 Unspecified asthma, uncomplicated: Secondary | ICD-10-CM | POA: Diagnosis not present

## 2024-05-09 DIAGNOSIS — Z5900 Homelessness unspecified: Secondary | ICD-10-CM | POA: Insufficient documentation

## 2024-05-09 DIAGNOSIS — Z760 Encounter for issue of repeat prescription: Secondary | ICD-10-CM | POA: Diagnosis present

## 2024-05-09 MED ORDER — ALBUTEROL SULFATE HFA 108 (90 BASE) MCG/ACT IN AERS: 1.0000 | INHALATION_SPRAY | Freq: Four times a day (QID) | RESPIRATORY_TRACT | 2 refills | Status: AC | PRN

## 2024-05-09 NOTE — ED Triage Notes (Signed)
 Pt arrived to ED via [facility]. Pt claims he needs inhaler. Pt claims breathe. EMS picked up pt at a store on [address]. Pt has been walking outside for a while. EMS states that pt has court at 9am. No complaints from pt otherwise.

## 2024-05-09 NOTE — ED Provider Notes (Signed)
 [place] EMERGENCY DEPARTMENT AT [facility] Provider Note   CSN: [id number] Arrival date & time: 05/09/24  9241     Patient presents with: Medication Refill   Cole [name] is a 43 y.o. male.   43 year old male history of asthma, homelessness, and malingering who presents emergency department for medication refill.  Patient reportedly was on the way to court when he called 911 to have an ambulance pick him up and take him to the emergency department.  Says that he wants his albuterol  refilled.  Is not having any shortness of breath or cough or any other symptoms at this point in time.  He is also requesting transportation to court.       Prior to Admission medications   Medication Sig Start Date End Date Taking? Authorizing Provider  albuterol  (VENTOLIN  HFA) 108 (90 Base) MCG/ACT inhaler Inhale 1-2 puffs into the lungs every 6 (six) hours as needed for wheezing or shortness of breath. 05/09/24  Yes Yolande Lamar BROCKS, MD  ALPRAZolam  (XANAX ) 1 MG tablet Take 0.5-1 mg by mouth 3 (three) times daily as needed for anxiety or sleep.    [provider]  clonazePAM  (KLONOPIN ) 0.5 MG tablet Take 0.5 mg by mouth 3 (three) times daily as needed for anxiety. 04/02/24   [provider]  clotrimazole  (LOTRIMIN ) 1 % cream Apply to affected area 2 times daily 04/21/24   Pollina, Christopher J, MD  gabapentin  (NEURONTIN ) 800 MG tablet Take 800 mg by mouth 3 (three) times daily. 01/26/24   [provider]  hydrocortisone  (ANUSOL -HC) 2.5 % rectal cream Place rectally 2 (two) times daily. Patient taking differently: Apply 1 Application topically 2 (two) times daily. 01/26/24   Leigh Corean Massa, MD  mirtazapine  (REMERON ) 7.5 MG tablet Take 1 tablet (7.5 mg total) by mouth at bedtime. 01/26/24   Leigh Corean Massa, MD  naloxone  (NARCAN ) nasal spray 4 mg/0.1 mL SMARTSIG:1 Both Nares Daily 01/09/24   [provider]  nicotine  polacrilex (NICORETTE ) 2 MG  gum Take 1 each (2 mg total) by mouth as needed for smoking cessation. 01/26/24   Leigh Corean Massa, MD  SUBOXONE  8-2 MG FILM Place 1 Film under the tongue 2 (two) times daily. 02/23/24   [provider]    Allergies: Aleve  [naproxen  sodium], Aspirin, Ibuprofen , Seroquel [quetiapine fumerate], Tramadol , and Tylenol  [acetaminophen ]    Review of Systems  Updated Vital Signs BP 128/85   Pulse 77   Temp 97.8 F (36.6 C) (Oral)   Resp 16   Ht 5' 9 (1.753 m)   Wt 88 kg   SpO2 98%   BMI 28.65 kg/m   Physical Exam Vitals and nursing note reviewed.  Constitutional:      General: He is not in acute distress.    Appearance: He is well-developed.  HENT:     Head: Normocephalic and atraumatic.     Right Ear: External ear normal.     Left Ear: External ear normal.     Nose: Nose normal.  Eyes:     Extraocular Movements: Extraocular movements intact.     Conjunctiva/sclera: Conjunctivae normal.     Pupils: Pupils are equal, round, and reactive to light.  Cardiovascular:     Rate and Rhythm: Normal rate and regular rhythm.     Heart sounds: Normal heart sounds.  Pulmonary:     Effort: Pulmonary effort is normal. No respiratory distress.     Breath sounds: Normal breath sounds.  Musculoskeletal:  Cervical back: Normal range of motion and neck supple.     Right lower leg: No edema.     Left lower leg: No edema.  Skin:    General: Skin is warm and dry.  Neurological:     Mental Status: He is alert. Mental status is at baseline.  Psychiatric:        Mood and Affect: Mood normal.        Behavior: Behavior normal.     (all labs ordered are listed, but only abnormal results are displayed) Labs Reviewed - No data to display  EKG: None  Radiology: No results found.   Procedures   Medications Ordered in the ED - No data to display                                  Medical Decision Making Risk Prescription drug management.   43 year old male with a  history of asthma, homelessness, and malingering who presents to the emergency department for medication refill  Initial Ddx:  Medication refill, secondary gain, malingering, asthma exacerbation  MDM/Course:  Patient presents to the emergency department for medication refill of his albuterol  inhaler.  Not having any respiratory symptoms.  Overall well-appearing.  Satting well on room air.  No wheezing on exam or abnormal lung sounds.  Does not appear to be in an asthma exacerbation at this point in time.  Was reportedly on the way to court.  There were some concerns by EMS that this could have been a call just that he could get a ride closer to court where he needs to go today.  His albuterol  is refilled.  He was instructed to follow-up with his primary doctor for additional refills  This patient presents to the ED for concern of complaints listed in HPI, this involves an extensive number of treatment options, and is a complaint that carries with it a high risk of complications and morbidity. Disposition including potential need for admission considered.   Dispo: DC Home. Return precautions discussed including, but not limited to, those listed in the AVS. Allowed pt time to ask questions which were answered fully prior to dc.  Additional history obtained from EMS Records reviewed Outpatient Clinic Notes I have reviewed the patients home medications and made adjustments as needed Social Determinants of health:  Homelessness  Portions of this note were generated with Scientist, clinical (histocompatibility and immunogenetics). Dictation errors may occur despite best attempts at proofreading.     Final diagnoses:  Medication refill    ED Discharge Orders          Ordered    albuterol  (VENTOLIN  HFA) 108 (90 Base) MCG/ACT inhaler  Every 6 hours PRN        05/09/24 0808               Yolande Lamar BROCKS, MD 05/09/24 [phone]

## 2024-05-09 NOTE — Discharge Instructions (Signed)
 You were seen for your medication refill in the emergency department.   At home, please take your medications as prescribed.    Check your MyChart online for the results of any tests that had not resulted by the time you left the emergency department.   Follow-up with your primary doctor in 2-3 days regarding your visit.  Talk to them about refilling any other medications that you need  Return immediately to the emergency department if you experience any of the following: Difficulty breathing, or any other concerning symptoms.    Thank you for visiting our Emergency Department. It was a pleasure taking care of you today.

## 2024-06-29 ENCOUNTER — Emergency Department (HOSPITAL_COMMUNITY)
Admission: EM | Admit: 2024-06-29 | Discharge: 2024-06-30 | Disposition: A | Discharge status: Home / Self Care | Attending: Emergency Medicine | Admitting: Emergency Medicine

## 2024-06-29 ENCOUNTER — Encounter (HOSPITAL_COMMUNITY): Payer: Self-pay | Admitting: Emergency Medicine

## 2024-06-29 ENCOUNTER — Other Ambulatory Visit: Payer: Self-pay

## 2024-06-29 DIAGNOSIS — R899 Unspecified abnormal finding in specimens from other organs, systems and tissues: Secondary | ICD-10-CM

## 2024-06-29 DIAGNOSIS — R799 Abnormal finding of blood chemistry, unspecified: Secondary | ICD-10-CM | POA: Insufficient documentation

## 2024-06-29 DIAGNOSIS — J45909 Unspecified asthma, uncomplicated: Secondary | ICD-10-CM | POA: Diagnosis not present

## 2024-06-29 DIAGNOSIS — F29 Unspecified psychosis not due to a substance or known physiological condition: Secondary | ICD-10-CM | POA: Insufficient documentation

## 2024-06-29 DIAGNOSIS — Z59869 Financial insecurity, unspecified: Secondary | ICD-10-CM | POA: Diagnosis not present

## 2024-06-29 DIAGNOSIS — R45851 Suicidal ideations: Secondary | ICD-10-CM | POA: Insufficient documentation

## 2024-06-29 NOTE — ED Provider Notes (Addendum)
 [place] EMERGENCY DEPARTMENT AT [facility] Provider Note   CSN: [id number] Arrival date & time: 06/29/24  2304     Patient presents with: Abnormal Labs   Cole [name] is a 43 y.o. male.   Patient is a 43 year old male with past medical history of asthma, anxiety, back pain, opiate abuse.  Patient presenting today at the request of his primary doctor.  He tells me he had labs drawn at [facility].  He was called by his provider who informed him that there was an abnormality in his blood work and that something in his CBC was very low.  She recommended he come to the ER to be evaluated.  The patient denies to me that he is experiencing any symptoms otherwise.       Prior to Admission medications   Medication Sig Start Date End Date Taking? Authorizing Provider  albuterol  (VENTOLIN  HFA) 108 (90 Base) MCG/ACT inhaler Inhale 1-2 puffs into the lungs every 6 (six) hours as needed for wheezing or shortness of breath. 05/09/24   Yolande Lamar BROCKS, MD  ALPRAZolam  (XANAX ) 1 MG tablet Take 0.5-1 mg by mouth 3 (three) times daily as needed for anxiety or sleep.    [provider]  clonazePAM  (KLONOPIN ) 0.5 MG tablet Take 0.5 mg by mouth 3 (three) times daily as needed for anxiety. 04/02/24   [provider]  clotrimazole  (LOTRIMIN ) 1 % cream Apply to affected area 2 times daily 04/21/24   Haze Lonni PARAS, MD  gabapentin  (NEURONTIN ) 800 MG tablet Take 800 mg by mouth 3 (three) times daily. 01/26/24   [provider]  hydrocortisone  (ANUSOL -HC) 2.5 % rectal cream Place rectally 2 (two) times daily. Patient taking differently: Apply 1 Application topically 2 (two) times daily. 01/26/24   Leigh Corean Massa, MD  mirtazapine  (REMERON ) 7.5 MG tablet Take 1 tablet (7.5 mg total) by mouth at bedtime. 01/26/24   Leigh Corean Massa, MD  naloxone  (NARCAN ) nasal spray 4 mg/0.1 mL SMARTSIG:1 Both Nares Daily 01/09/24   [provider]  nicotine   polacrilex (NICORETTE ) 2 MG gum Take 1 each (2 mg total) by mouth as needed for smoking cessation. 01/26/24   Leigh Corean Massa, MD  SUBOXONE  8-2 MG FILM Place 1 Film under the tongue 2 (two) times daily. 02/23/24   [provider]    Allergies: Aleve  [naproxen  sodium], Aspirin, Ibuprofen , Seroquel [quetiapine fumerate], Tramadol , and Tylenol  [acetaminophen ]    Review of Systems  All other systems reviewed and are negative.   Updated Vital Signs BP (!) 139/99 (BP Location: Right Arm)   Pulse 86   Temp 98.5 F (36.9 C) (Oral)   Resp 18   Ht 5' 9 (1.753 m)   Wt 95.3 kg   SpO2 99%   BMI 31.01 kg/m   Physical Exam Vitals and nursing note reviewed.  Constitutional:      General: He is not in acute distress.    Appearance: He is well-developed. He is not diaphoretic.  HENT:     Head: Normocephalic and atraumatic.  Cardiovascular:     Rate and Rhythm: Normal rate and regular rhythm.     Heart sounds: No murmur heard.    No friction rub.  Pulmonary:     Effort: Pulmonary effort is normal. No respiratory distress.     Breath sounds: Normal breath sounds. No wheezing or rales.  Abdominal:     General: Bowel sounds are normal. There is no distension.     Palpations: Abdomen  is soft.     Tenderness: There is no abdominal tenderness.  Musculoskeletal:        General: Normal range of motion.     Cervical back: Normal range of motion and neck supple.  Skin:    General: Skin is warm and dry.  Neurological:     Mental Status: He is alert and oriented to person, place, and time.     Coordination: Coordination normal.     (all labs ordered are listed, but only abnormal results are displayed) Labs Reviewed  COMPREHENSIVE METABOLIC PANEL WITH GFR  CBC WITH DIFFERENTIAL/PLATELET    EKG: None  Radiology: No results found.   Procedures   Medications Ordered in the ED - No data to display                                  Medical Decision Making Amount and/or  Complexity of Data Reviewed Labs: ordered.   Patient initially presenting here with complaints of abnormal laboratory values.  He tells me his psychiatrist from [facility] called him to inform him that his blood work was abnormal and that he should come to the ER to be evaluated.  Patient has no complaints otherwise.  I initially obtained a CBC and CMP, both of which are basically normal.  When I returned to the room to discuss these results with him, the patient informs me that he needed to speak with a psychiatrist because he was suicidal.  He states he is having some financial issues and feels as though he would be better off dead and is having thoughts of ending his life.  I will consult TTS, but suspect a degree of malingering.  Patient has been evaluated by TTS who does not feel as though patient warrants inpatient treatment.  He will be discharged with outpatient follow-up.    Final diagnoses:  None    ED Discharge Orders     None          Geroldine Berg, MD 06/30/24 9965    Geroldine Berg, MD 06/30/24 0127

## 2024-06-29 NOTE — ED Triage Notes (Signed)
 Pt was seen by his mental health doctor via tele-health yesterday and was told that lab work he had done 2 weeks prior came back showing that his blood count was very low and that he needed to go to nearest ER to have it checked.

## 2024-06-29 NOTE — ED Notes (Signed)
 Assumed care of pt. He indicated that his MH provider told him his blood was low.  No physical complaints at this time.  Pt calm and cooperative at this time.  Blanket given.

## 2024-06-30 LAB — CBC WITH DIFFERENTIAL/PLATELET
Abs Immature Granulocytes: 0.02 K/uL (ref 0.00–0.07)
Basophils Absolute: 0 K/uL (ref 0.0–0.1)
Basophils Relative: 0 %
Eosinophils Absolute: 0.1 K/uL (ref 0.0–0.5)
Eosinophils Relative: 1 %
HCT: 42.8 % (ref 39.0–52.0)
Hemoglobin: 13.8 g/dL (ref 13.0–17.0)
Immature Granulocytes: 0 %
Lymphocytes Relative: 30 %
Lymphs Abs: 2.5 K/uL (ref 0.7–4.0)
MCH: 25.4 pg — ABNORMAL LOW (ref 26.0–34.0)
MCHC: 32.2 g/dL (ref 30.0–36.0)
MCV: 78.7 fL — ABNORMAL LOW (ref 80.0–100.0)
Monocytes Absolute: 0.8 K/uL (ref 0.1–1.0)
Monocytes Relative: 9 %
Neutro Abs: 4.9 K/uL (ref 1.7–7.7)
Neutrophils Relative %: 60 %
Platelets: 193 K/uL (ref 150–400)
RBC: 5.44 MIL/uL (ref 4.22–5.81)
RDW: 13.8 % (ref 11.5–15.5)
WBC: 8.3 K/uL (ref 4.0–10.5)
nRBC: 0 % (ref 0.0–0.2)

## 2024-06-30 LAB — COMPREHENSIVE METABOLIC PANEL WITH GFR
ALT: 33 U/L (ref 0–44)
AST: 37 U/L (ref 15–41)
Albumin: 4.9 g/dL (ref 3.5–5.0)
Alkaline Phosphatase: 50 U/L (ref 38–126)
Anion gap: 10 (ref 5–15)
BUN: 16 mg/dL (ref 6–20)
CO2: 29 mmol/L (ref 22–32)
Calcium: 9.3 mg/dL (ref 8.9–10.3)
Chloride: 102 mmol/L (ref 98–111)
Creatinine, Ser: 0.99 mg/dL (ref 0.61–1.24)
GFR, Estimated: >60 mL/min (ref >60)
Glucose, Bld: 83 mg/dL (ref 70–99)
Potassium: 3.6 mmol/L (ref 3.5–5.1)
Sodium: 141 mmol/L (ref 135–145)
Total Bilirubin: 0.9 mg/dL (ref 0.0–1.2)
Total Protein: 8.2 g/dL — ABNORMAL HIGH (ref 6.5–8.1)

## 2024-06-30 LAB — ETHANOL: Alcohol, Ethyl (B): <15 mg/dL (ref <15)

## 2024-06-30 NOTE — ED Notes (Signed)
Tts in progress

## 2024-06-30 NOTE — BH Assessment (Signed)
 Comprehensive Clinical Assessment (CCA) Note  06/30/2024 Cole [name] [id number]  Chief Complaint:  Chief Complaint  Patient presents with   Abnormal Labs  Disposition: Per Roxianne Bobbitt,NP patient is recommended for discharge with outpatient follow-up.  The patient demonstrates the following risk factors for suicide: Chronic risk factors for suicide include: psychiatric disorder of Schizoaffective disorder,bipolar type, PTSD,MDD,anxiety and substance use disorder. Acute risk factors for suicide include: loss (financial, interpersonal, professional). Protective factors for this patient include: hope for the future. Considering these factors, the overall suicide risk at this point appears to be low. Patient is appropriate for outpatient follow up.   Patient is a 43 year old male with a history of Schizoaffective disorder,bipolar type, PTSD,MDD,anxiety and malingering who presents voluntarily to [facility] for an assessment. Patient resides in the home with his mother and identifies her as their primary support system. Patient reports isolation, irritability, loss of interest to do things they enjoy, and decreased sleep. Patient states he came to the hospital to get blood drawn because his psychiatrist told him that he had abnormal labs at his last visit and that he needed updated labwork. Patient states he is also seeking a 28 day program for substance use due receiving a drug possession charge recently. He states he has a court date in March and needs to engage in a treatment program to avoid jail time and get his charges dismissed. Patient also reports passive SI due to ongoing stress from his recent drug charge and wanting to move out of his mothers home. Patient denied any plans or intent to harm himself. He denied any past suicide attempts. Patient reported hx of Substance Abuse: cocaine.  Last use was 2-3 days ago. He denies any other substance use and denies any withdrawal symptoms. He does  reports smoking cigarettes daily.  Patient denies NSSIB, paranoia, HI, and AVH.  Patient reports history of verbal abuse. Patient is not receiving outpatient therapy at this time but states he is interested in resources.  Patient denies access to weapons. Patient is able to verbally contract for safety outside of the hospital.    During evaluation patient is in no acute distress. He is alert, oriented x 4, calm, cooperative and attentive. His mood is anxious with congruent affect. He has mumbled speech/ articulation error but normal behavior.  Objectively there is no evidence of psychosis/mania or delusional thinking.  Patient is able to converse coherently, goal directed thoughts, no distractibility, or pre-occupation.  Patient answered question appropriately.      Visit Diagnosis:  Suicidal Ideation     CCA Screening, Triage and Referral (STR)  Patient Reported Information How did you hear about us ? Self  What Is the Reason for Your Visit/Call Today? Per EDP note Patient is a 43 year old male with past medical history of asthma, anxiety, back pain, opiate abuse.  Patient presenting today at the request of his primary doctor.  He tells me he had labs drawn at [facility].  He was called by his provider who informed him that there was an abnormality in his blood work and that something in his CBC was very low.  She recommended he come to the ER to be evaluated.  The patient denies to me that he is experiencing any symptoms otherwise.  How Long Has This Been Causing You Problems? <Week  What Do You Feel Would Help You the Most Today? Treatment for Depression or other mood problem; Alcohol or Drug Use Treatment   Have You Recently Had Any Thoughts  About Hurting Yourself? Yes  Are You Planning to Commit Suicide/Harm Yourself At This time? No   Flowsheet Row ED from 06/29/2024 in [facility] Emergency Department at [facility] ED from 05/09/2024 in [facility] Emergency Department at  [facility] ED from 04/21/2024 in [facility] Emergency Department at [facility]  C-SSRS RISK CATEGORY Low Risk No Risk No Risk    Have you Recently Had Thoughts About Hurting Someone Sherral? No  Are You Planning to Harm Someone at This Time? No  Explanation: n/a   Have You Used Any Alcohol or Drugs in the Past 24 Hours? No  How Long Ago Did You Use Drugs or Alcohol? N/a What Did You Use and How Much? N/a  Do You Currently Have a Therapist/Psychiatrist? Yes  Name of Therapist/Psychiatrist: Name of Therapist/Psychiatrist: [employer] [facility] [place]- med management   Have You Been Recently Discharged From Any Public Relations Account Executive or Programs? No  Explanation of Discharge From Practice/Program: N/A     CCA Screening Triage Referral Assessment Type of Contact: Tele-Assessment  Telemedicine Service Delivery: Telemedicine service delivery: This service was provided via telemedicine using a 2-way, interactive audio and video technology  Is this Initial or Reassessment? Is this Initial or Reassessment?: Initial Assessment  Date Telepsych consult ordered in [facility]:  Date Telepsych consult ordered in [facility]: 06/30/24  Time Telepsych consult ordered in [facility]:  Time Telepsych consult ordered in [facility]: 0033  Location of Assessment: [facility]  Provider Location: [facility] Services   Collateral Involvement: No collateral involved.   Does Patient Have a Automotive Engineer Guardian? No  Legal Guardian Contact Information: n/a  Copy of Legal Guardianship Form: -- (n/a)  Legal Guardian Notified of Arrival: -- (n/a)  Legal Guardian Notified of Pending Discharge: -- (n/a)  If Minor and Not Living with Parent(s), Who has Custody? n/a  Is CPS involved or ever been involved? Never  Is APS involved or ever been involved? Never   Patient Determined To Be At Risk for Harm To Self or Others Based on Review of Patient Reported Information or Presenting Complaint?  Yes, for Self-Harm  Method: No Plan  Availability of Means: No access or NA  Intent: Vague intent or NA  Notification Required: No need or identified person  Additional Information for Danger to Others Potential: -- (n/a)  Additional Comments for Danger to Others Potential: N/A, no HI  Are There Guns or Other Weapons in Your Home? No  Types of Guns/Weapons: N/A  Are These Weapons Safely Secured?                            -- (n/a)  Who Could Verify You Are Able To Have These Secured: N/A  Do You Have any Outstanding Charges, Pending Court Dates, Parole/Probation? court date in March for drug possession, per his report  Contacted To Inform of Risk of Harm To Self or Others: Other: Comment (n/a)    Does Patient Present under Involuntary Commitment? No    County of Residence: Other (Comment) (n/a)   Patient Currently Receiving the Following Services: Medication Management   Determination of Need: Urgent (48 hours)   Options For Referral: Medication Management; Outpatient Therapy     CCA Biopsychosocial Patient Reported Schizophrenia/Schizoaffective Diagnosis in Past: Yes (per his report)   Strengths: Patient is seeking treatment and he is engaged in outpt MH treatment   Mental Health Symptoms Depression:  Sleep (too  much or little); Change in energy/activity; Irritability   Duration of Depressive symptoms: Duration of Depressive Symptoms: Greater than two weeks   Mania:  None   Anxiety:   Tension; Worrying; Sleep   Psychosis:  None   Duration of Psychotic symptoms:    Trauma:  None   Obsessions:  None   Compulsions:  None   Inattention:  N/A   Hyperactivity/Impulsivity:  N/A   Oppositional/Defiant Behaviors:  N/A   Emotional Irregularity:  Recurrent suicidal behaviors/gestures/threats   Other Mood/Personality Symptoms:  Depression symptoms.    Mental Status Exam Appearance and self-care  Stature:  Average   Weight:  Average weight    Clothing:  Casual   Grooming:  Normal   Cosmetic use:  None   Posture/gait:  Normal   Motor activity:  Not Remarkable   Sensorium  Attention:  Normal   Concentration:  Normal   Orientation:  X5   Recall/memory:  Normal   Affect and Mood  Affect:  Anxious   Mood:  Anxious   Relating  Eye contact:  Normal   Facial expression:  Sad; Anxious   Attitude toward examiner:  Cooperative   Thought and Language  Speech flow: Articulation error; Other (Comment) (mumbled speech)   Thought content:  Appropriate to Mood and Circumstances   Preoccupation:  None   Hallucinations:  None   Organization:  Coherent   [employer] of Knowledge:  Fair   Intelligence:  Average   Abstraction:  Functional   Judgement:  Impaired   Reality Testing:  Realistic   Insight:  Lacking   Decision Making:  Impulsive   Social Functioning  Social Maturity:  Isolates   Social Judgement:  Chief Of Staff   Stress  Stressors:  Housing; Surveyor, Quantity; Transitions   Coping Ability:  Overwhelmed; Deficient supports   Skill Deficits:  Decision making; Responsibility; Interpersonal; Self-control   Supports:  Friends/Service system     Religion: Religion/Spirituality Are You A Religious Person?: Yes What is Your Religious Affiliation?: Christian How Might This Affect Treatment?: None.  Leisure/Recreation: Leisure / Recreation Do You Have Hobbies?: No  Exercise/Diet: Exercise/Diet Do You Exercise?: No Have You Gained or Lost A Significant Amount of Weight in the Past Six Months?: No Do You Follow a Special Diet?: No Do You Have Any Trouble Sleeping?: Yes Explanation of Sleeping Difficulties: poor   CCA Employment/Education Employment/Work Situation: Employment / Work Situation Employment Situation: Unemployed Patient's Job has Been Impacted by Current Illness: No Has Patient ever Been in Equities Trader?: No  Education: Education Is Patient Currently  Attending School?: No Last Grade Completed: 11 Did You Product Manager?: No Did You Have An Individualized Education Program (IIEP): No Did You Have Any Difficulty At [employer]?: No Patient's Education Has Been Impacted by Current Illness: No   CCA Family/Childhood History Family and Relationship History: Family history Marital status: Single Does patient have children?: Yes How many children?: 1 How is patient's relationship with their children?: n/a  Childhood History:  Childhood History By whom was/is the patient raised?: Mother, Other (Comment) (Patient reports also being raised by his mother's sister) Did patient suffer any verbal/emotional/physical/sexual abuse as a child?: No Did patient suffer from severe childhood neglect?: No Has patient ever been sexually abused/assaulted/raped as an adolescent or adult?: No Was the patient ever a victim of a crime or a disaster?: No Witnessed domestic violence?: Yes Has patient been affected by domestic violence as an adult?: No Description of domestic violence: uta  CCA Substance Use Alcohol/Drug Use: Alcohol / Drug Use Pain Medications: See MAR Prescriptions: See MAR Over the Counter: See MAR History of alcohol / drug use?: Yes Longest period of sobriety (when/how long): occasional cocaine use Negative Consequences of Use: Legal, Financial Withdrawal Symptoms: None                         ASAM's:  Six Dimensions of Multidimensional Assessment  Dimension 1:  Acute Intoxication and/or Withdrawal Potential:   Dimension 1:  Description of individual's past and current experiences of substance use and withdrawal: History of cocaine usage.  Dimension 2:  Biomedical Conditions and Complications:   Dimension 2:  Description of patient's biomedical conditions and  complications: None.  Dimension 3:  Emotional, Behavioral, or Cognitive Conditions and Complications:  Dimension 3:  Description of emotional, behavioral,  or cognitive conditions and complications: SI and depression.  Dimension 4:  Readiness to Change:  Dimension 4:  Description of Readiness to Change criteria: Pt reports rehab history, he wants to be admitted to rebab facility for 28 days.  Dimension 5:  Relapse, Continued use, or Continued Problem Potential:  Dimension 5:  Relapse, continued use, or continued problem potential critiera description: Patient denied substance abuse  Dimension 6:  Recovery/Living Environment:  Dimension 6:  Recovery/Iiving environment criteria description: Pt reports, he lives with his mother  ASAM Severity Score: ASAM's Severity Rating Score: 10  ASAM Recommended Level of Treatment: ASAM Recommended Level of Treatment: Level II Intensive Outpatient Treatment   Substance use Disorder (SUD) Substance Use Disorder (SUD)  Checklist Symptoms of Substance Use: Continued use despite having a persistent/recurrent physical/psychological problem caused/exacerbated by use, Continued use despite persistent or recurrent social, interpersonal problems, caused or exacerbated by use, Substance(s) often taken in larger amounts or over longer times than was intended, Social, occupational, recreational activities given up or reduced due to use, Recurrent use that results in a failure to fulfill major role obligations (work, school, home)  Recommendations for Services/Supports/Treatments: Recommendations for Services/Supports/Treatments Recommendations For Services/Supports/Treatments: Individual Therapy, Medication Management (Pt to be observed and reassessed by psychiatry.)  Disposition Recommendation per psychiatric provider: There are no psychiatric contraindications to discharge at this time   DSM5 Diagnoses: Patient Active Problem List   Diagnosis Date Noted   Benzodiazepine dependence (HCC) 01/24/2024   Malingering 01/24/2024   MDD (major depressive disorder), recurrent episode, severe (HCC) 01/23/2024   Cocaine use  disorder, severe, dependence (HCC) 10/06/2023   Cannabis use disorder, severe, dependence (HCC) 10/06/2023   Tardive dyskinesia 02/24/2023   Schizoaffective disorder, bipolar type (HCC) 02/20/2023   Homelessness 12/13/2022   Polysubstance abuse (HCC) 12/13/2022   Substance induced mood disorder (HCC) 12/13/2022   Suicidal ideation 12/13/2022   Severe recurrent major depression (HCC) 07/16/2017   Major depressive disorder, recurrent episode, moderate (HCC) 08/13/2015   Opioid use disorder, severe, dependence (HCC) per history 08/13/2015   Benzodiazepine abuse (HCC) 08/13/2015   Tobacco use disorder 08/13/2015   Hepatitis C antibody test positive 01/22/2015   PTSD (post-traumatic stress disorder) 01/20/2015   Stimulant use disorder (HCC) 01/20/2015   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

## 2024-06-30 NOTE — Discharge Instructions (Signed)
Follow-up with your counselor °

## 2024-08-11 ENCOUNTER — Emergency Department (HOSPITAL_COMMUNITY)
Admission: EM | Admit: 2024-08-11 | Discharge: 2024-08-11 | Discharge status: Against Medical Advice | Attending: Emergency Medicine | Admitting: Emergency Medicine

## 2024-08-11 DIAGNOSIS — M25571 Pain in right ankle and joints of right foot: Secondary | ICD-10-CM | POA: Insufficient documentation

## 2024-08-11 DIAGNOSIS — Z5321 Procedure and treatment not carried out due to patient leaving prior to being seen by health care provider: Secondary | ICD-10-CM | POA: Insufficient documentation

## 2024-08-11 DIAGNOSIS — M25572 Pain in left ankle and joints of left foot: Secondary | ICD-10-CM | POA: Insufficient documentation

## 2024-08-11 NOTE — Progress Notes (Signed)
 Patient [name]. Patient of complaining of bilateral foot pain that started a couple of months ago. During triage patient stated he was leaving and would be back. Patient left.
